# Patient Record
Sex: Female | Born: 1945 | Race: White | Hispanic: No | Marital: Married | Smoking: Former smoker
Health system: Southern US, Community
[De-identification: ages and names within clinical notes are randomized; demographics above are authoritative.]

## PROBLEM LIST (undated history)

## (undated) DIAGNOSIS — J45909 Unspecified asthma, uncomplicated: Secondary | ICD-10-CM

## (undated) DIAGNOSIS — E669 Obesity, unspecified: Secondary | ICD-10-CM

## (undated) DIAGNOSIS — I700 Atherosclerosis of aorta: Secondary | ICD-10-CM

## (undated) DIAGNOSIS — M1990 Unspecified osteoarthritis, unspecified site: Secondary | ICD-10-CM

## (undated) DIAGNOSIS — F411 Generalized anxiety disorder: Secondary | ICD-10-CM

## (undated) DIAGNOSIS — R42 Dizziness and giddiness: Secondary | ICD-10-CM

## (undated) DIAGNOSIS — G2581 Restless legs syndrome: Secondary | ICD-10-CM

## (undated) DIAGNOSIS — I447 Left bundle-branch block, unspecified: Secondary | ICD-10-CM

## (undated) DIAGNOSIS — T7840XA Allergy, unspecified, initial encounter: Secondary | ICD-10-CM

## (undated) DIAGNOSIS — M4317 Spondylolisthesis, lumbosacral region: Secondary | ICD-10-CM

## (undated) DIAGNOSIS — I10 Essential (primary) hypertension: Secondary | ICD-10-CM

## (undated) DIAGNOSIS — K219 Gastro-esophageal reflux disease without esophagitis: Secondary | ICD-10-CM

## (undated) DIAGNOSIS — G709 Myoneural disorder, unspecified: Secondary | ICD-10-CM

## (undated) DIAGNOSIS — I5040 Unspecified combined systolic (congestive) and diastolic (congestive) heart failure: Secondary | ICD-10-CM

## (undated) DIAGNOSIS — H8109 Meniere's disease, unspecified ear: Secondary | ICD-10-CM

## (undated) DIAGNOSIS — H9191 Unspecified hearing loss, right ear: Secondary | ICD-10-CM

## (undated) DIAGNOSIS — R002 Palpitations: Secondary | ICD-10-CM

## (undated) DIAGNOSIS — Z972 Presence of dental prosthetic device (complete) (partial): Secondary | ICD-10-CM

## (undated) DIAGNOSIS — T8859XA Other complications of anesthesia, initial encounter: Secondary | ICD-10-CM

## (undated) HISTORY — PX: HALLUX VALGUS CORRECTION: SUR315

## (undated) HISTORY — PX: BUNIONECTOMY: SHX129

## (undated) HISTORY — PX: COLONOSCOPY: SHX174

## (undated) HISTORY — PX: ECTOPIC PREGNANCY SURGERY: SHX613

## (undated) HISTORY — DX: Atherosclerosis of aorta: I70.0

## (undated) HISTORY — PX: BREAST SURGERY: SHX581

## (undated) HISTORY — PX: FRACTURE SURGERY: SHX138

## (undated) HISTORY — PX: OTHER SURGICAL HISTORY: SHX169

## (undated) HISTORY — PX: SPINE SURGERY: SHX786

## (undated) HISTORY — PX: SHOULDER SURGERY: SHX246

## (undated) HISTORY — PX: APPENDECTOMY: SHX54

## (undated) HISTORY — PX: CARPAL TUNNEL RELEASE: SHX101

## (undated) HISTORY — DX: Allergy, unspecified, initial encounter: T78.40XA

## (undated) HISTORY — PX: TRIGGER FINGER RELEASE: SHX641

## (undated) HISTORY — PX: DILATION AND CURETTAGE OF UTERUS: SHX78

## (undated) HISTORY — PX: MEDIAL PARTIAL KNEE REPLACEMENT: SHX5965

## (undated) HISTORY — DX: Spondylolisthesis, lumbosacral region: M43.17

## (undated) HISTORY — PX: CARDIAC CATHETERIZATION: SHX172

## (undated) HISTORY — PX: EYE SURGERY: SHX253

## (undated) HISTORY — PX: JOINT REPLACEMENT: SHX530

---

## 2012-03-02 ENCOUNTER — Ambulatory Visit: Payer: Self-pay | Admitting: Otolaryngology

## 2014-02-12 ENCOUNTER — Ambulatory Visit: Payer: Self-pay | Admitting: Unknown Physician Specialty

## 2014-04-20 ENCOUNTER — Ambulatory Visit: Payer: Self-pay | Admitting: Unknown Physician Specialty

## 2014-12-07 ENCOUNTER — Ambulatory Visit: Payer: Self-pay | Admitting: Unknown Physician Specialty

## 2015-01-22 ENCOUNTER — Ambulatory Visit
Admit: 2015-01-22 | Disposition: A | Discharge status: Home / Self Care | Payer: Self-pay | Attending: Unknown Physician Specialty | Admitting: Unknown Physician Specialty

## 2015-04-22 ENCOUNTER — Encounter: Payer: Self-pay | Admitting: Emergency Medicine

## 2015-04-22 ENCOUNTER — Ambulatory Visit
Admission: EM | Admit: 2015-04-22 | Discharge: 2015-04-22 | Disposition: A | Discharge status: Home / Self Care | Payer: Medicare Other | Attending: Internal Medicine | Admitting: Internal Medicine

## 2015-04-22 DIAGNOSIS — T63481A Toxic effect of venom of other arthropod, accidental (unintentional), initial encounter: Secondary | ICD-10-CM | POA: Diagnosis not present

## 2015-04-22 HISTORY — DX: Unspecified asthma, uncomplicated: J45.909

## 2015-04-22 HISTORY — DX: Essential (primary) hypertension: I10

## 2015-04-22 MED ORDER — DIPHENHYDRAMINE HCL 25 MG PO TABS: 25.0000 mg | ORAL_TABLET | Freq: Three times a day (TID) | ORAL | Status: DC | PRN

## 2015-04-22 MED ORDER — CETIRIZINE HCL 10 MG PO TABS: 10.0000 mg | ORAL_TABLET | Freq: Every day | ORAL | Status: DC

## 2015-04-22 MED ORDER — METHYLPREDNISOLONE SODIUM SUCC 125 MG IJ SOLR
125.0000 mg | Freq: Once | INTRAMUSCULAR | Status: AC
  Administered 2015-04-22: 125 mg via INTRAMUSCULAR

## 2015-04-22 NOTE — ED Provider Notes (Signed)
CSN: [id number]     Arrival date & time 04/22/15  [id number] History   First MD Initiated Contact with Patient 04/22/15 1108     Chief Complaint  Patient presents with  . Insect Bite   (Consider location/radiation/quality/duration/timing/severity/associated sxs/prior Treatment) HPI Comments: Married caucasian female here for evaluation of bee sting right ear occurred last night woke this am with swollen right eyelids and discomfort right side of face, upper gum discomfort wears dentures.  Works from home.  Has never been stung by bee before this was first time tried epsom salt compresses, baking soda last night.  Benadryl x 2 doses and regular medications taken as prescribed.  The history is provided by the patient.    Past Medical History  Diagnosis Date  . Hypertension   . Asthma    Past Surgical History  Procedure Laterality Date  . Medial partial knee replacement Bilateral   . Shoulder surgery Right    History reviewed. No pertinent family history. History  Substance Use Topics  . Smoking status: Former Games developermoker  . Smokeless tobacco: Never Used  . Alcohol Use: Yes   OB History    No data available     Review of Systems  Constitutional: Negative for fever, chills, diaphoresis, activity change, appetite change and fatigue.  HENT: Positive for ear pain, facial swelling and hearing loss. Negative for congestion, dental problem, drooling, ear discharge, mouth sores, nosebleeds, postnasal drip, rhinorrhea, sinus pressure, sneezing, sore throat, tinnitus, trouble swallowing and voice change.   Eyes: Negative for photophobia, pain, discharge, redness, itching and visual disturbance.  Respiratory: Negative for apnea, cough, choking, chest tightness, shortness of breath, wheezing and stridor.   Cardiovascular: Negative for chest pain, palpitations and leg swelling.  Gastrointestinal: Negative for nausea, vomiting, abdominal pain, diarrhea, constipation, blood in stool and abdominal  distention.  Endocrine: Negative for cold intolerance and heat intolerance.  Genitourinary: Negative for dysuria, hematuria, enuresis and difficulty urinating.  Musculoskeletal: Negative for myalgias, back pain, joint swelling, arthralgias, gait problem, neck pain and neck stiffness.  Skin: Positive for rash. Negative for color change, pallor and wound.  Allergic/Immunologic: Positive for environmental allergies. Negative for food allergies.  Neurological: Negative for dizziness, tremors, seizures, syncope, facial asymmetry, speech difficulty, weakness, light-headedness, numbness and headaches.  Hematological: Negative for adenopathy. Does not bruise/bleed easily.  Psychiatric/Behavioral: Negative for behavioral problems, confusion, sleep disturbance and agitation.    Allergies  Sulfa antibiotics and Zithromax  Home Medications   Prior to Admission medications   Medication Sig Start Date End Date Taking? Authorizing Provider  amLODipine (NORVASC) 5 MG tablet Take 5 mg by mouth daily.   Yes Historical Provider, MD  clonazePAM (KLONOPIN) 0.5 MG tablet Take 0.5 mg by mouth at bedtime.   Yes Historical Provider, MD  fluticasone (FLOVENT HFA) 44 MCG/ACT inhaler Inhale 1 puff into the lungs as needed.   Yes Historical Provider, MD  fosinopril (MONOPRIL) 40 MG tablet Take 40 mg by mouth daily.   Yes Historical Provider, MD  gabapentin (NEURONTIN) 300 MG capsule Take 300 mg by mouth at bedtime.   Yes Historical Provider, MD  hydrochlorothiazide (HYDRODIURIL) 25 MG tablet Take 25 mg by mouth daily.   Yes Historical Provider, MD  meloxicam (MOBIC) 7.5 MG tablet Take 7.5 mg by mouth 2 (two) times daily.   Yes Historical Provider, MD  metoprolol (LOPRESSOR) 50 MG tablet Take 50 mg by mouth 2 (two) times daily.   Yes Historical Provider, MD  ranitidine (ZANTAC) 150 MG tablet Take 150  mg by mouth at bedtime.   Yes Historical Provider, MD  zolpidem (AMBIEN) 5 MG tablet Take 5 mg by mouth at bedtime.    Yes Historical Provider, MD  cetirizine (ZYRTEC) 10 MG tablet Take 1 tablet (10 mg total) by mouth daily. 04/22/15   Barbaraann Barthel, NP  diphenhydrAMINE (BENADRYL) 25 MG tablet Take 1 tablet (25 mg total) by mouth every 8 (eight) hours as needed for itching (breakthrough itching). 04/22/15   Jarold Song Renalda Locklin, NP   BP 151/67 mmHg  Pulse 65  Temp(Src) 98 F (36.7 C) (Tympanic)  Resp 17  Ht 5' (1.524 m)  Wt 211 lb (95.709 kg)  BMI 41.21 kg/m2  SpO2 99% Physical Exam  Constitutional: She is oriented to person, place, and time. Vital signs are normal. She appears well-developed and well-nourished. No distress.  HENT:  Head: Normocephalic and atraumatic. Head is without raccoon's eyes, without Battle's sign, without abrasion, without contusion, without laceration, without right periorbital erythema and without left periorbital erythema. Hair is normal.    Right Ear: External ear and ear canal normal. A middle ear effusion is present.  Left Ear: External ear and ear canal normal. A middle ear effusion is present.  Nose: Nose normal.  Mouth/Throat: Uvula is midline. Mucous membranes are pale, dry and not cyanotic. She does not have dentures. No oral lesions. No trismus in the jaw. Normal dentition. No dental abscesses, uvula swelling, lacerations or dental caries. Posterior oropharyngeal edema and posterior oropharyngeal erythema present. No oropharyngeal exudate or tonsillar abscesses.  Tongue with macular symmetrical lesions x 6 anterior white not tender patient unaware of them until I showed her in mirror; tongue not swollen speech clear; air fluid level bilateral TMs clear; cobblestoning posterior pharynx with drip from above  Eyes: Conjunctivae, EOM and lids are normal. Pupils are equal, round, and reactive to light. Right eye exhibits no chemosis, no discharge, no exudate and no hordeolum. No foreign body present in the right eye. Left eye exhibits no chemosis, no discharge, no exudate and no  hordeolum. No foreign body present in the left eye. Right conjunctiva is not injected. Right conjunctiva has no hemorrhage. Left conjunctiva is not injected. Left conjunctiva has no hemorrhage. No scleral icterus. Right eye exhibits normal extraocular motion and no nystagmus. Left eye exhibits normal extraocular motion and no nystagmus. Right pupil is round and reactive. Left pupil is round and reactive. Pupils are equal.  Nonpitting edema upper and lower eyelid 1-2+/4 right only  Neck: Trachea normal and normal range of motion. Neck supple. No tracheal deviation present. No thyromegaly present.  Cardiovascular: Normal rate, regular rhythm, normal heart sounds and intact distal pulses.  Exam reveals no gallop and no friction rub.   No murmur heard. Pulmonary/Chest: Effort normal and breath sounds normal. No stridor. No respiratory distress. She has no wheezes. She has no rales. She exhibits no tenderness.  Abdominal: Soft. She exhibits no distension.  Musculoskeletal: Normal range of motion. She exhibits no edema or tenderness.  Lymphadenopathy:    She has no cervical adenopathy.  Neurological: She is alert and oriented to person, place, and time. She exhibits normal muscle tone. Coordination normal.  Skin: Skin is warm, dry and intact. Rash noted. She is not diaphoretic. There is erythema. No pallor.  Psychiatric: She has a normal mood and affect. Her speech is normal and behavior is normal. Judgment and thought content normal. Cognition and memory are normal.  Nursing note and vitals reviewed.   ED Course  Procedures (including critical care time) Labs Review Labs Reviewed - No data to display  Imaging Review No results found.  1200 patient reported feeling slightly better.  Ready to go home.  VSS.  Encouraged Benadryl  po QID today then zyrtec  po daily tomorrow.  Zantac  po BID.  Discussed if fever, chills, eye pain, worsening swelling after 48 hours s/p solumedrol to contact  me/clinic.  If when clinic closed and having visual changes/loss, eye orbit pain, fever, chills to go to ER for re-evaluation.  Patient verbalized understanding of information/instructions, agreed with plan of care and had no further questions at this time.  Medications  methylPREDNISolone sodium succinate (SOLU-MEDROL) 125 mg/2 mL injection 125 mg (125 mg Intramuscular Given 04/22/15 1134)   Filed Vitals:   04/22/15 1136  BP: 135/64  Pulse: 61  Temp:   Resp: 16    MDM   1. Allergic reaction to insect sting, accidental or unintentional, initial encounter    Plan: 1. Test/x-ray results and diagnosis reviewed with patient 2. rx as per orders; risks, benefits, potential side effects reviewed with patient 3. Recommend supportive treatment with ice, zyrtec or benadryl, zantac, calamine 4. F/u prn if symptoms worsen or don't improve  Benadryl 25-50mg  po pm prn breaththrough itching otherwise zyrtec  po qam.  Zantac  po BID at home.  May apply calamine lotion or topical benadryl or ice also to right ear/face for itching.  Im solumedrol preferred by patient..  Symptomatic therapy suggested.  Warm to cool water soaks or tepid showers.  If another bee sting this week seek follow up with [facility] or [facility] urgent care as exposure this close together could trigger stronger response.  Call or return to clinic as needed if these symptoms worsen or fail to improve as anticipated.  Go to ER if difficulty breathing, swallowing, dizziness or chest pain for immediate evaluation and treatment follow up with [facility] if ER visit required.   Discussed signs/symptoms infection e.g. Fever, chills, worsening redness, swelling.  Exitcare handout on allergic uticaria, insect bite/sting, cellulitis given to patient.    Patient verbalized agreement and understanding of treatment plan and had no further questions at this time.   P2:  Avoidance and hand washing.   Barbaraann Barthel, NP 04/22/15 1446

## 2015-04-22 NOTE — Discharge Instructions (Signed)
Cellulitis °Cellulitis is an infection of the skin and the tissue beneath it. The infected area is usually red and tender. Cellulitis occurs most often in the arms and lower legs.  °CAUSES  °Cellulitis is caused by bacteria that enter the skin through cracks or cuts in the skin. The most common types of bacteria that cause cellulitis are staphylococci and streptococci. °SIGNS AND SYMPTOMS  °· Redness and warmth. °· Swelling. °· Tenderness or pain. °· Fever. °DIAGNOSIS  °Your health care provider can usually determine what is wrong based on a physical exam. Blood tests may also be done. °TREATMENT  °Treatment usually involves taking an antibiotic medicine. °HOME CARE INSTRUCTIONS  °· Take your antibiotic medicine as directed by your health care provider. Finish the antibiotic even if you start to feel better. °· Keep the infected arm or leg elevated to reduce swelling. °· Apply a warm cloth to the affected area up to 4 times per day to relieve pain. °· Take medicines only as directed by your health care provider. °· Keep all follow-up visits as directed by your health care provider. °SEEK MEDICAL CARE IF:  °· You notice red streaks coming from the infected area. °· Your red area gets larger or turns dark in color. °· Your bone or joint underneath the infected area becomes painful after the skin has healed. °· Your infection returns in the same area or another area. °· You notice a swollen bump in the infected area. °· You develop new symptoms. °· You have a fever. °SEEK IMMEDIATE MEDICAL CARE IF:  °· You feel very sleepy. °· You develop vomiting or diarrhea. °· You have a general ill feeling (malaise) with muscle aches and pains. °MAKE SURE YOU:  °· Understand these instructions. °· Will watch your condition. °· Will get help right away if you are not doing well or get worse. °Document Released: 07/01/2005 Document Revised: 02/05/2014 Document Reviewed: 12/07/2011 °ExitCare® Patient Information ©2015 [employer].  This information is not intended to replace advice given to you by your health care provider. Make sure you discuss any questions you have with your health care provider. °Bee, Wasp, or Hornet Sting °Your caregiver has diagnosed you as having an insect sting. An insect sting appears as a red lump in the skin that sometimes has a tiny hole in the center, or it may have a stinger in the center of the wound. The most common stings are from wasps, hornets and bees. °Individuals have different reactions to insect stings. °· A normal reaction may cause pain, swelling, and redness around the sting site. °· A localized allergic reaction may cause swelling and redness that extends beyond the sting site. °· A large local reaction may continue to develop over the next 12 to 36 hours. °· On occasion, the reactions can be severe (anaphylactic reaction). An anaphylactic reaction may cause wheezing; difficulty breathing; chest pain; fainting; raised, itchy, red patches on the skin; a sick feeling to your stomach (nausea); vomiting; cramping; or diarrhea. If you have had an anaphylactic reaction to an insect sting in the past, you are more likely to have one again. °HOME CARE INSTRUCTIONS  °· With bee stings, a small sac of poison is left in the wound. Brushing across this with something such as a credit card, or anything similar, will help remove this and decrease the amount of the reaction. This same procedure will not help a wasp sting as they do not leave behind a stinger and poison sac. °· Apply   a cold compress for 10 to 20 minutes every hour for 1 to 2 days, depending on severity, to reduce swelling and itching. °· To lessen pain, a paste made of water and baking soda may be rubbed on the bite or sting and left on for 5 minutes. °· To relieve itching and swelling, you may use take medication or apply medicated creams or lotions as directed. °· Only take over-the-counter or prescription medicines for pain, discomfort, or fever  as directed by your caregiver. °· Wash the sting site daily with soap and water. Apply antibiotic ointment on the sting site as directed. °· If you suffered a severe reaction: °· If you did not require hospitalization, an adult will need to stay with you for 24 hours in case the symptoms return. °· You may need to wear a medical bracelet or necklace stating the allergy. °· You and your family need to learn when and how to use an anaphylaxis kit or epinephrine injection. °· If you have had a severe reaction before, always carry your anaphylaxis kit with you. °SEEK MEDICAL CARE IF:  °· None of the above helps within 2 to 3 days. °· The area becomes red, warm, tender, and swollen beyond the area of the bite or sting. °· You have an oral temperature above 102° F (38.9° C). °SEEK IMMEDIATE MEDICAL CARE IF:  °You have symptoms of an allergic reaction which are: °· Wheezing. °· Difficulty breathing. °· Chest pain. °· Lightheadedness or fainting. °· Itchy, raised, red patches on the skin. °· Nausea, vomiting, cramping or diarrhea. °ANY OF THESE SYMPTOMS MAY REPRESENT A SERIOUS PROBLEM THAT IS AN EMERGENCY. Do not wait to see if the symptoms will go away. Get medical help right away. Call your local emergency services (911 in [place] DO NOT drive yourself to the hospital. °MAKE SURE YOU:  °· Understand these instructions. °· Will watch your condition. °· Will get help right away if you are not doing well or get worse. °Document Released: 09/21/2005 Document Revised: 12/14/2011 Document Reviewed: 03/08/2010 °ExitCare® Patient Information ©2015 [employer]. This information is not intended to replace advice given to you by your health care provider. Make sure you discuss any questions you have with your health care provider. ° °

## 2015-04-22 NOTE — ED Notes (Signed)
Patient states that she was stung by a honey bee in her right ear.  Patient reports swelling in her R ear and the swelling in her right eye that started this morning.

## 2015-06-21 ENCOUNTER — Other Ambulatory Visit: Payer: Self-pay | Admitting: Unknown Physician Specialty

## 2015-06-21 DIAGNOSIS — M25552 Pain in left hip: Secondary | ICD-10-CM

## 2015-06-29 ENCOUNTER — Ambulatory Visit
Admission: RE | Admit: 2015-06-29 | Discharge: 2015-06-29 | Disposition: A | Discharge status: Home / Self Care | Payer: Medicare Other | Source: Ambulatory Visit | Attending: Unknown Physician Specialty | Admitting: Unknown Physician Specialty

## 2015-06-29 DIAGNOSIS — M1612 Unilateral primary osteoarthritis, left hip: Secondary | ICD-10-CM | POA: Insufficient documentation

## 2015-06-29 DIAGNOSIS — M25552 Pain in left hip: Secondary | ICD-10-CM | POA: Diagnosis present

## 2015-07-19 ENCOUNTER — Ambulatory Visit: Payer: Medicare Other | Admitting: Anesthesiology

## 2015-07-19 ENCOUNTER — Encounter: Payer: Self-pay | Admitting: *Deleted

## 2015-07-19 ENCOUNTER — Ambulatory Visit
Admission: RE | Admit: 2015-07-19 | Discharge: 2015-07-19 | Disposition: A | Discharge status: Home / Self Care | Payer: Medicare Other | Source: Ambulatory Visit | Attending: Unknown Physician Specialty | Admitting: Unknown Physician Specialty

## 2015-07-19 ENCOUNTER — Encounter
Admission: RE | Disposition: A | Discharge status: Home / Self Care | Payer: Self-pay | Source: Ambulatory Visit | Attending: Unknown Physician Specialty

## 2015-07-19 DIAGNOSIS — M1990 Unspecified osteoarthritis, unspecified site: Secondary | ICD-10-CM | POA: Diagnosis not present

## 2015-07-19 DIAGNOSIS — Z820 Family history of epilepsy and other diseases of the nervous system: Secondary | ICD-10-CM | POA: Insufficient documentation

## 2015-07-19 DIAGNOSIS — M25562 Pain in left knee: Secondary | ICD-10-CM | POA: Diagnosis present

## 2015-07-19 DIAGNOSIS — Z882 Allergy status to sulfonamides status: Secondary | ICD-10-CM | POA: Insufficient documentation

## 2015-07-19 DIAGNOSIS — Z96652 Presence of left artificial knee joint: Secondary | ICD-10-CM | POA: Insufficient documentation

## 2015-07-19 DIAGNOSIS — Z6841 Body Mass Index (BMI) 40.0 and over, adult: Secondary | ICD-10-CM | POA: Diagnosis not present

## 2015-07-19 DIAGNOSIS — Z881 Allergy status to other antibiotic agents status: Secondary | ICD-10-CM | POA: Diagnosis not present

## 2015-07-19 DIAGNOSIS — M25462 Effusion, left knee: Secondary | ICD-10-CM | POA: Insufficient documentation

## 2015-07-19 DIAGNOSIS — I10 Essential (primary) hypertension: Secondary | ICD-10-CM | POA: Insufficient documentation

## 2015-07-19 DIAGNOSIS — M2342 Loose body in knee, left knee: Secondary | ICD-10-CM | POA: Diagnosis not present

## 2015-07-19 DIAGNOSIS — Z79899 Other long term (current) drug therapy: Secondary | ICD-10-CM | POA: Insufficient documentation

## 2015-07-19 DIAGNOSIS — J45909 Unspecified asthma, uncomplicated: Secondary | ICD-10-CM | POA: Diagnosis not present

## 2015-07-19 DIAGNOSIS — Z8249 Family history of ischemic heart disease and other diseases of the circulatory system: Secondary | ICD-10-CM | POA: Diagnosis not present

## 2015-07-19 DIAGNOSIS — K219 Gastro-esophageal reflux disease without esophagitis: Secondary | ICD-10-CM | POA: Insufficient documentation

## 2015-07-19 DIAGNOSIS — Z87891 Personal history of nicotine dependence: Secondary | ICD-10-CM | POA: Diagnosis not present

## 2015-07-19 DIAGNOSIS — Z825 Family history of asthma and other chronic lower respiratory diseases: Secondary | ICD-10-CM | POA: Insufficient documentation

## 2015-07-19 DIAGNOSIS — M65862 Other synovitis and tenosynovitis, left lower leg: Secondary | ICD-10-CM | POA: Diagnosis not present

## 2015-07-19 DIAGNOSIS — E669 Obesity, unspecified: Secondary | ICD-10-CM | POA: Diagnosis not present

## 2015-07-19 HISTORY — DX: Myoneural disorder, unspecified: G70.9

## 2015-07-19 HISTORY — DX: Dizziness and giddiness: R42

## 2015-07-19 HISTORY — PX: KNEE ARTHROSCOPY: SHX127

## 2015-07-19 HISTORY — DX: Gastro-esophageal reflux disease without esophagitis: K21.9

## 2015-07-19 HISTORY — DX: Presence of dental prosthetic device (complete) (partial): Z97.2

## 2015-07-19 HISTORY — DX: Meniere's disease, unspecified ear: H81.09

## 2015-07-19 HISTORY — DX: Unspecified osteoarthritis, unspecified site: M19.90

## 2015-07-19 SURGERY — ARTHROSCOPY, KNEE
Anesthesia: General | Laterality: Left | Wound class: Clean

## 2015-07-19 MED ORDER — PROPOFOL 10 MG/ML IV BOLUS
INTRAVENOUS | Status: DC | PRN
  Administered 2015-07-19: 150 mg via INTRAVENOUS

## 2015-07-19 MED ORDER — LACTATED RINGERS IV SOLN
INTRAVENOUS | Status: DC
  Administered 2015-07-19: 07:00:00 via INTRAVENOUS

## 2015-07-19 MED ORDER — BUPIVACAINE HCL (PF) 0.5 % IJ SOLN
INTRAMUSCULAR | Status: DC | PRN
  Administered 2015-07-19: 20 mL

## 2015-07-19 MED ORDER — FENTANYL CITRATE (PF) 100 MCG/2ML IJ SOLN
INTRAMUSCULAR | Status: DC | PRN
  Administered 2015-07-19 (×4): 50 ug via INTRAVENOUS

## 2015-07-19 MED ORDER — OXYCODONE HCL 5 MG/5ML PO SOLN: 5.0000 mg | Freq: Once | ORAL | Status: DC | PRN

## 2015-07-19 MED ORDER — PROMETHAZINE HCL 25 MG/ML IJ SOLN: 6.2500 mg | INTRAMUSCULAR | Status: DC | PRN

## 2015-07-19 MED ORDER — LIDOCAINE HCL (CARDIAC) 20 MG/ML IV SOLN
INTRAVENOUS | Status: DC | PRN
  Administered 2015-07-19: 40 mg via INTRATRACHEAL

## 2015-07-19 MED ORDER — ONDANSETRON HCL 4 MG/2ML IJ SOLN
INTRAMUSCULAR | Status: DC | PRN
  Administered 2015-07-19: 4 mg via INTRAVENOUS

## 2015-07-19 MED ORDER — OXYCODONE HCL 5 MG PO TABS: 5.0000 mg | ORAL_TABLET | Freq: Once | ORAL | Status: DC | PRN

## 2015-07-19 MED ORDER — HYDROCODONE-ACETAMINOPHEN 5-325 MG PO TABS: 1.0000 | ORAL_TABLET | Freq: Four times a day (QID) | ORAL | Status: DC | PRN

## 2015-07-19 MED ORDER — CEFAZOLIN SODIUM 1-5 GM-% IV SOLN
INTRAVENOUS | Status: DC | PRN
  Administered 2015-07-19: 1 g via INTRAVENOUS

## 2015-07-19 MED ORDER — DEXAMETHASONE SODIUM PHOSPHATE 4 MG/ML IJ SOLN
INTRAMUSCULAR | Status: DC | PRN
  Administered 2015-07-19: 8 mg via INTRAVENOUS

## 2015-07-19 MED ORDER — FENTANYL CITRATE (PF) 100 MCG/2ML IJ SOLN
25.0000 ug | INTRAMUSCULAR | Status: DC | PRN
  Administered 2015-07-19: 25 ug via INTRAVENOUS
  Administered 2015-07-19: 50 ug via INTRAVENOUS
  Administered 2015-07-19: 25 ug via INTRAVENOUS

## 2015-07-19 MED ORDER — MIDAZOLAM HCL 5 MG/5ML IJ SOLN
INTRAMUSCULAR | Status: DC | PRN
  Administered 2015-07-19: 2 mg via INTRAVENOUS

## 2015-07-19 MED ORDER — CEFAZOLIN SODIUM-DEXTROSE 2-3 GM-% IV SOLR: INTRAVENOUS | Status: DC | PRN

## 2015-07-19 SURGICAL SUPPLY — 40 items
ARTHROWAND PARAGON T2 (SURGICAL WAND) ×3
BLADE ABRADER 4.5 (BLADE) ×3 IMPLANT
BLADE FULL RADIUS 3.5 (BLADE) ×3 IMPLANT
BUR RADIUS 3.5 (BURR) IMPLANT
BUR RADIUS 4.0X18.5 (BURR) IMPLANT
BUR ROUND 5.5 (BURR) IMPLANT
BURR ROUND 12 FLUTE 4.0MM (BURR) IMPLANT
COVER LIGHT HANDLE FLEXIBLE (MISCELLANEOUS) ×3 IMPLANT
CUFF TOURN SGL QUICK 24 (TOURNIQUET CUFF)
CUFF TOURN SGL QUICK 30 (MISCELLANEOUS)
CUFF TOURN SGL QUICK 34 (TOURNIQUET CUFF)
CUFF TRNQT CYL 24X4X40X1 (TOURNIQUET CUFF) IMPLANT
CUFF TRNQT CYL 34X4X40X1 (TOURNIQUET CUFF) IMPLANT
CUFF TRNQT CYL LO 30X4X (MISCELLANEOUS) IMPLANT
CUTTER SLOTTED WHISKER 4.0 (BURR) IMPLANT
DRAPE LEGGINS SURG 28X43 STRL (DRAPES) ×3 IMPLANT
DURAPREP 26ML APPLICATOR (WOUND CARE) ×3 IMPLANT
GAUZE SPONGE 4X4 12PLY STRL (GAUZE/BANDAGES/DRESSINGS) ×3 IMPLANT
GLOVE BIO SURGEON STRL SZ7.5 (GLOVE) ×3 IMPLANT
GLOVE BIO SURGEON STRL SZ8 (GLOVE) ×3 IMPLANT
GLOVE INDICATOR 8.0 STRL GRN (GLOVE) ×3 IMPLANT
GOWN STRL REIN 2XL XLG LVL4 (GOWN DISPOSABLE) ×3 IMPLANT
GOWN STRL REUS W/TWL 2XL LVL3 (GOWN DISPOSABLE) ×3 IMPLANT
IV LACTATED RINGER IRRG 3000ML (IV SOLUTION) ×4
IV LR IRRIG 3000ML ARTHROMATIC (IV SOLUTION) ×2 IMPLANT
MANIFOLD 4PT FOR NEPTUNE1 (MISCELLANEOUS) ×3 IMPLANT
PACK ARTHROSCOPY KNEE (MISCELLANEOUS) ×3 IMPLANT
SET TUBE SUCT SHAVER OUTFL 24K (TUBING) ×3 IMPLANT
SOL PREP PVP 2OZ (MISCELLANEOUS) ×3
SOLUTION PREP PVP 2OZ (MISCELLANEOUS) ×1 IMPLANT
SUT ETHILON 3-0 FS-10 30 BLK (SUTURE) ×3
SUTURE EHLN 3-0 FS-10 30 BLK (SUTURE) ×1 IMPLANT
TAPE MICROFOAM 4IN (TAPE) ×3 IMPLANT
TUBING ARTHRO INFLOW-ONLY STRL (TUBING) ×3 IMPLANT
WAND ARTHRO PARAGON T2 (SURGICAL WAND) ×1 IMPLANT
WAND COVAC 50 IFS (MISCELLANEOUS) IMPLANT
WAND HAND CNTRL MULTIVAC 50 (MISCELLANEOUS) IMPLANT
WAND HAND CNTRL MULTIVAC 90 (MISCELLANEOUS) IMPLANT
WAND MEGAVAC 90 (MISCELLANEOUS) IMPLANT
WAND ULTRAVAC 90 (MISCELLANEOUS) IMPLANT

## 2015-07-19 NOTE — Transfer of Care (Signed)
Immediate Anesthesia Transfer of Care Note  Patient: Katrina Poole  Procedure(s) Performed: Procedure(s) with comments: ARTHROSCOPY KNEE (Left) - 1ST CASE PER CECE  Patient Location: PACU  Anesthesia Type: General  Level of Consciousness: awake, alert  and patient cooperative  Airway and Oxygen Therapy: Patient Spontanous Breathing and Patient connected to supplemental oxygen  Post-op Assessment: Post-op Vital signs reviewed, Patient's Cardiovascular Status Stable, Respiratory Function Stable, Patent Airway and No signs of Nausea or vomiting  Post-op Vital Signs: Reviewed and stable  Complications: No apparent anesthesia complications

## 2015-07-19 NOTE — Anesthesia Postprocedure Evaluation (Signed)
  Anesthesia Post-op Note  Patient: Katrina Poole  Procedure(s) Performed: Procedure(s) with comments: ARTHROSCOPY KNEE (Left) - 1ST CASE PER CECE  Anesthesia type:General  Patient location: PACU  Post pain: Pain level controlled  Post assessment: Post-op Vital signs reviewed, Patient's Cardiovascular Status Stable, Respiratory Function Stable, Patent Airway and No signs of Nausea or vomiting  Post vital signs: Reviewed and stable  Last Vitals:  Filed Vitals:   07/19/15 0945  BP: 148/89  Pulse: 78  Temp:   Resp: 18    Level of consciousness: awake, alert  and patient cooperative  Complications: No apparent anesthesia complications

## 2015-07-19 NOTE — Anesthesia Preprocedure Evaluation (Addendum)
Anesthesia Evaluation    Airway Mallampati: II  TM Distance: >3 FB Neck ROM: Full    Dental no notable dental hx. (+) Upper Dentures   Pulmonary asthma , former smoker,    Pulmonary exam normal breath sounds clear to auscultation       Cardiovascular hypertension, negative cardio ROS Normal cardiovascular exam Rhythm:Regular Rate:Normal     Neuro/Psych  Neuromuscular disease    GI/Hepatic GERD  Controlled,  Endo/Other    Renal/GU      Musculoskeletal  (+) Arthritis ,   Abdominal (+) + obese,   Peds  Hematology   Anesthesia Other Findings   Reproductive/Obstetrics                            Anesthesia Physical Anesthesia Plan  ASA: III  Anesthesia Plan: General   Post-op Pain Management:    Induction: Intravenous  Airway Management Planned:   Additional Equipment:   Intra-op Plan:   Post-operative Plan: Extubation in OR  Informed Consent: I have reviewed the patients History and Physical, chart, labs and discussed the procedure including the risks, benefits and alternatives for the proposed anesthesia with the patient or authorized representative who has indicated his/her understanding and acceptance.   Dental advisory given  Plan Discussed with: CRNA  Anesthesia Plan Comments:         Anesthesia Quick Evaluation

## 2015-07-19 NOTE — Anesthesia Procedure Notes (Signed)
Procedure Name: LMA Insertion Date/Time: 07/19/2015 7:45 AM Performed by: Andee PolesBUSH, Chance Karam Pre-anesthesia Checklist: Patient identified, Emergency Drugs available, Suction available, Timeout performed and Patient being monitored Patient Re-evaluated:Patient Re-evaluated prior to inductionOxygen Delivery Method: Circle system utilized Preoxygenation: Pre-oxygenation with 100% oxygen Intubation Type: IV induction LMA: LMA inserted LMA Size: 4.0 Number of attempts: 1 Placement Confirmation: positive ETCO2 and breath sounds checked- equal and bilateral Tube secured with: Tape

## 2015-07-19 NOTE — Discharge Instructions (Signed)
General Anesthesia, Adult, Care After °Refer to this sheet in the next few weeks. These instructions provide you with information on caring for yourself after your procedure. Your health care provider may also give you more specific instructions. Your treatment has been planned according to current medical practices, but problems sometimes occur. Call your health care provider if you have any problems or questions after your procedure. °WHAT TO EXPECT AFTER THE PROCEDURE °After the procedure, it is typical to experience: °· Sleepiness. °· Nausea and vomiting. °HOME CARE INSTRUCTIONS °· For the first 24 hours after general anesthesia: °¨ Have a responsible person with you. °¨ Do not drive a car. If you are alone, do not take public transportation. °¨ Do not drink alcohol. °¨ Do not take medicine that has not been prescribed by your health care provider. °¨ Do not sign important papers or make important decisions. °¨ You may resume a normal diet and activities as directed by your health care provider. °· Change bandages (dressings) as directed. °· If you have questions or problems that seem related to general anesthesia, call the hospital and ask for the anesthetist or anesthesiologist on call. °SEEK MEDICAL CARE IF: °· You have nausea and vomiting that continue the day after anesthesia. °· You develop a rash. °SEEK IMMEDIATE MEDICAL CARE IF:  °· You have difficulty breathing. °· You have chest pain. °· You have any allergic problems. °  °This information is not intended to replace advice given to you by your health care provider. Make sure you discuss any questions you have with your health care provider. °  °Document Released: 12/28/2000 Document Revised: 10/12/2014 Document Reviewed: 01/20/2012 °Elsevier Interactive Patient Education ©2016 [employer]. ° ° °[facility]  °Demetrice Amstutz B. Crislyn Willbanks, Jr., M.D. [phone]  ° °KNEE ARTHROSCOPY POST OPERATION INSTRUCTIONS: ° °PLEASE  READ THESE INSTRUCTIONS ABOUT POST OPERATION CARE. THEY WILL ANSWER MOST OF YOUR QUESTIONS.  °You have been given a prescription for pain. Please take as directed for pain.  °You can walk, keeping the knee slightly stiff-avoid doing too much bending the first day. (if ACL reconstruction is performed, keep brace locked in extension when walking.)  °You will use crutches or cane if needed. Can weight bear as tolerated  °Plan to take three to four days off from work. You can resume work when you are comfortable. (This can be a week or more, depending on the type of work you do.)  °To reduce pain and swelling, place one to two pillows under the knee the first two or three days when sitting or lying. An ice pack may be placed on top of the area over the dressing. Instructions for making homemade icepack are as follow:  °Flexible homemade alcohol water ice pack  °2 cups water  °1 cup rubbing alcohol  °food coloring for the blue tint (optional)  °2 zip-top bags - gallon-size  °Mix the water and alcohol together in one of your zip-top bags and add food coloring. Release as much air as possible and seal the bag. Place in freezer for at least 12 hours.  °The small incisions in your knee are closed with nylon stitches. They will be removed in the office.  °The bulky dressing may be removed in the third day after surgery. (If ACL surgery-DO NOT REMOVE BANDAGES). Put a waterproof band-aid over each stitch. Do not put any creams or ointments on wounds. You may shower at this time, but change waterproof band-aids after showering. KEEP INCISIONS CLEAN   AND DRY UNTIL YOU RETURN TO THE OFFICE.  °Sometimes the operative area remains somewhat painful and swollen for several weeks. This is usually nothing to worry about, but call if you have any excessive symptoms, especially fever. It is not unusual to have a low grade fever of 99 degrees for the first few days. If persist after 3-4 days call the office. It is not uncommon for the pain  to be a little worse on the third day after surgery.  °Begin doing gentle exercises right away. They will be limited by the amount of pain and swelling you have.  Exercising will reduce the swelling, increase motion, and prevent muscle weakness. Exercises: Straight leg raising and gentle knee bending.  °Take 81 milligram aspirin twice a day for 2 weeks after meals or milk. This along with elevation will help reduce the possibility of phlebitis in your operated leg.  °Avoid strenuous athletics for a minimum of 4 to 6 weeks after arthroscopic surgery (approximately five months if ACL surgery).  °If the surgery included ACL reconstruction the brace that is supplied to the extremity post surgery is to be locked in extension when you are asleep and is to be locked in extension when you are ambulating. It can be unlocked for exercises or sitting.  °Keep your post surgery appointment that has been made for you. If you do not remember the date call [phone]. Your follow up appointment should be between 7-10 days.  ° °

## 2015-07-19 NOTE — Op Note (Signed)
Patient:  Katrina Poole, Liesel G.  Preoperative diagnosis: Reactive synovitis plus possible loose bodies  Postop diagnosis: Same plus early lateral compartment and retropatellar chondral changes  Operation: Arthroscopic limited synovectomy along with lateral compartment and retropatellar chondral debridement plus removal of several small loose bodies  Surgeon: Randon GoldsmithKERNODLE,Kelee Cunningham B, MD  Anesthesia: Gen.   History: Patient's had a long history of left knee pain.  The patient ultimately had a medial unicompartmental knee replacement. Despite the surgery she still had some residual left knee pain along with an intermittent effusion. Follow-up x-rays of her left knee had revealed a little narrowing of her lateral compartment. Her medial  unicompartmental knee replacement remained well positioned with no evidence for loosening. She was brought in for arthroscopic evaluation of her left knee after her knee aspirate was no growth.  The patient was ultimately scheduled for left knee arthroscopy.  The patient was taken the operating room where satisfactory general anesthesia was achieved. A tourniquet and leg holder were was applied to the left thigh. A well leg support was applied to the nonoperative extremity. The left knee was prepped and draped in usual fashion for an arthroscopic procedure. The patient was given 1 g of Kefzol IV prior to the actual start of the procedure. An inflow cannula was introduced superomedially. The joint was distended with lactated Ringer's. Scope was introduced through an inferolateral puncture wound and a probe through an inferomedial puncture wound. Inspection of the medial compartment revealed  that her unicompartmental knee replacement components were stable. There was some synovial ingrowth between the femoral component and the polyethylene spacer. This tissue was debrided with a small synovial resector. I also used a synovial resector to remove some chondral overgrowth on the  lateral border of the medial femoral component. Inspection of the intercondylar notch revealed an old anterior cruciate tear. Inspection of the the lateral compartment revealed some grade 2 chondral changes in the mid weightbearing portion of the lateral femoral condyle and fibrillation of the lateral tibial plateau. The patient also had a degenerative tear of the posterior horn root of the lateral meniscus. This tear was resected with a motorized resector. I debrided the frayed lateral compartment articular surfaces with a turbo whisker and then coblated the lateral femoral chondral lesion with a [employer] wand. I also removed several small loose bodies from the lateral compartment. Trochlear groove was inspected and appeared to be fairly smooth.  Retropatellar surface was slightly frayed with several areas of grade 2 chondral changes. I debrided these lesions with a turbo whisker. I observed patella tracking from the superomedial portal. The patella seemed to track fairly well.  The instruments were removed from the joint at this time. The puncture wounds were closed with 3-0 nylon in vertical mattress fashion. I injected each puncture wound with several cc of half percent Marcaine without epinephrine. Betadine was applied the wounds followed by sterile dressing. An ice pack was applied to the right knee. The patient was awakened and transferred to the stretcher bed. The patient was taken to the recovery room in satisfactory condition.  The tourniquet was  inflated shortly after the start of the procedure and then deflated at the conclusion of the procedure. It was up about 38 minutes. Blood loss was negligible.

## 2015-07-19 NOTE — H&P (Signed)
  H and P reviewed. No changes. Uploaded at later date. 

## 2015-07-22 ENCOUNTER — Encounter: Payer: Self-pay | Admitting: Unknown Physician Specialty

## 2015-08-15 DIAGNOSIS — Z9889 Other specified postprocedural states: Secondary | ICD-10-CM

## 2015-08-15 HISTORY — DX: Other specified postprocedural states: Z98.890

## 2015-08-19 ENCOUNTER — Other Ambulatory Visit: Payer: Self-pay | Admitting: Unknown Physician Specialty

## 2015-08-19 DIAGNOSIS — Z96652 Presence of left artificial knee joint: Secondary | ICD-10-CM

## 2015-08-26 ENCOUNTER — Encounter: Admission: RE | Admit: 2015-08-26 | Payer: Medicare Other | Source: Ambulatory Visit

## 2015-09-04 ENCOUNTER — Ambulatory Visit
Admission: RE | Admit: 2015-09-04 | Discharge: 2015-09-04 | Disposition: A | Discharge status: Home / Self Care | Payer: Medicare Other | Source: Ambulatory Visit | Attending: Unknown Physician Specialty | Admitting: Unknown Physician Specialty

## 2015-09-04 ENCOUNTER — Encounter
Admission: RE | Admit: 2015-09-04 | Discharge: 2015-09-04 | Disposition: A | Discharge status: Home / Self Care | Payer: Medicare Other | Source: Ambulatory Visit | Attending: Unknown Physician Specialty | Admitting: Unknown Physician Specialty

## 2015-09-04 DIAGNOSIS — M25562 Pain in left knee: Secondary | ICD-10-CM | POA: Insufficient documentation

## 2015-09-04 DIAGNOSIS — Z96652 Presence of left artificial knee joint: Secondary | ICD-10-CM | POA: Diagnosis present

## 2015-09-04 MED ORDER — TECHNETIUM TC 99M MEDRONATE IV KIT
25.0000 | PACK | Freq: Once | INTRAVENOUS | Status: AC | PRN
  Administered 2015-09-04: 23.845 via INTRAVENOUS

## 2015-09-23 ENCOUNTER — Ambulatory Visit
Admission: EM | Admit: 2015-09-23 | Discharge: 2015-09-23 | Disposition: A | Discharge status: Home / Self Care | Payer: Medicare Other | Attending: Emergency Medicine | Admitting: Emergency Medicine

## 2015-09-23 DIAGNOSIS — J069 Acute upper respiratory infection, unspecified: Secondary | ICD-10-CM | POA: Diagnosis not present

## 2015-09-23 DIAGNOSIS — J310 Chronic rhinitis: Secondary | ICD-10-CM

## 2015-09-23 DIAGNOSIS — J329 Chronic sinusitis, unspecified: Secondary | ICD-10-CM

## 2015-09-23 MED ORDER — MOMETASONE FUROATE 50 MCG/ACT NA SUSP: 2.0000 | Freq: Every day | NASAL | Status: DC

## 2015-09-23 NOTE — ED Notes (Signed)
Started this past Saturday with nasal congestion, head pressure. 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 +sore throat this morning, nasal congestion

## 2015-09-23 NOTE — Discharge Instructions (Signed)
Continue the saline nasal irrigation. This is the best thing for you. Use the Nasonex once to twice a day. Return if you get worse, have a fever >100.4, or for any concerns. You may take 600 mg of motrin with 1 gram of tylenol up to 3-4 times a day as needed for pain. This is an effective combination for pain.  Most infections are viral and do not need antibiotics unless you have a high fever, have had this for 10 days, or you get better and then get sick again.    Go to www.goodrx.com to look up your medications. This will give you a list of where you can find your prescriptions at the most affordable prices.

## 2015-09-23 NOTE — ED Provider Notes (Signed)
HPI  SUBJECTIVE:  Katrina Poole is a 69 y.o. female who presents with nasal congestion for the past 2 days, postnasal drip, sore throat, cough, maxillary sinus pain/pressure, mild chest tightness, mild frontal headache. Patient has been using Vicks vapor rub her nostrils, neti pot several times a day, taking hot showers. Symptoms are worse with lying down, no alleviating factors. She denies fevers, bodyaches, dental pain, coughing all night long.  No wheezing, chest pain, shortness of breath. No sick contacts. She did get a pneumonia shot. She did not get a flu shot this year. Is concerned because she has to get on a plane tomorrow. Past medical history of hypertension well controlled medications, sinusitis, pneumonia, bronchitis. No history of diabetes, asthma, emphysema, COPD, she is not a smoker.  Past Medical History  Diagnosis Date  . Hypertension     controlled on meds  . Neuromuscular disorder (HCC)     finger tips go numb, neck vertebrae  . Asthma     humidity and exercise during summer  . Arthritis     osteo of left hip  . GERD (gastroesophageal reflux disease)   . Vertigo   . Wears dentures     full dentures on top, partial bottom  . Meniere syndrome     hearing deaf right ear    Past Surgical History  Procedure Laterality Date  . Medial partial knee replacement Bilateral   . Shoulder surgery Right   . Trigger finger release Bilateral   . Hallux valgus correction Bilateral   . Exploratory laparoscopy      infertility surgery  . Carpal tunnel release    . Colonoscopy    . Cardiac catheterization      x2 both normal  . Joint replacement Bilateral     partial knee  . Knee arthroscopy Left 07/19/2015    Procedure: Arthroscopic limited synovectomy along with lateral compartment and retropatellar chondral debridement plus removal of several small loose bodies;  Surgeon: Erin SonsHarold Kernodle, MD;  [facility];  Service: Orthopedics;  Laterality: Left;  1ST  CASE PER CECE    Family History  Problem Relation Age of Onset  . Heart failure Mother   . COPD Mother     Social History  Substance Use Topics  . Smoking status: Former Smoker -- 1.50 packs/day for 30 years    Types: Cigarettes  . Smokeless tobacco: Never Used  . Alcohol Use: Yes     Comment: 2-3 / month    No current facility-administered medications for this encounter.  Current outpatient prescriptions:  .  amLODipine (NORVASC) 5 MG tablet, Take 5 mg by mouth daily. am, Disp: , Rfl:  .  clonazePAM (KLONOPIN) 0.5 MG tablet, Take 0.5 mg by mouth at bedtime., Disp: , Rfl:  .  fosinopril (MONOPRIL) 40 MG tablet, Take 40 mg by mouth daily. am, Disp: , Rfl:  .  gabapentin (NEURONTIN) 300 MG capsule, Take 600 mg by mouth at bedtime. , Disp: , Rfl:  .  hydrochlorothiazide (HYDRODIURIL) 25 MG tablet, Take 25 mg by mouth daily. am, Disp: , Rfl:  .  meloxicam (MOBIC) 7.5 MG tablet, Take 7.5 mg by mouth 2 (two) times daily. Am and pm, Disp: , Rfl:  .  metoprolol (LOPRESSOR) 50 MG tablet, Take 50 mg by mouth daily. am, Disp: , Rfl:  .  Multiple Vitamin (MULTIVITAMIN) tablet, Take 1 tablet by mouth daily. am, Disp: , Rfl:  .  ranitidine (ZANTAC) 150 MG tablet, Take 150 mg  by mouth daily. am, Disp: , Rfl:  .  Sennosides-Docusate Sodium (STOOL SOFTENER LAXATIVE PO), Take by mouth as needed. am, Disp: , Rfl:  .  zolpidem (AMBIEN) 5 MG tablet, Take 5 mg by mouth at bedtime., Disp: , Rfl:  .  [DISCONTINUED] cetirizine (ZYRTEC) 10 MG tablet, Take 1 tablet (10 mg total) by mouth daily. (Patient taking differently: Take 10 mg by mouth as needed for allergies. ), Disp: 30 tablet, Rfl: 0 .  fluticasone (FLOVENT HFA) 44 MCG/ACT inhaler, Inhale 1 puff into the lungs as needed., Disp: , Rfl:  .  mometasone (NASONEX) 50 MCG/ACT nasal spray, Place 2 sprays into the nose daily., Disp: 17 g, Rfl: 0 .  [DISCONTINUED] diphenhydrAMINE (BENADRYL) 25 MG tablet, Take 1 tablet (25 mg total) by mouth every 8 (eight)  hours as needed for itching (breakthrough itching). (Patient not taking: Reported on 07/11/2015), Disp: 20 tablet, Rfl: 0 .  [DISCONTINUED] fluticasone (FLONASE) 50 MCG/ACT nasal spray, Place 2 sprays into both nostrils as needed for allergies (spring). , Disp: , Rfl:   Allergies  Allergen Reactions  . Sulfa Antibiotics Other (See Comments)    Altered Metal Status  . Zithromax [Azithromycin] Hives     ROS  As noted in HPI.   Physical Exam  BP 129/85 mmHg  Pulse 78  Temp(Src) 97.6 F (36.4 C) (Tympanic)  Resp 18  Ht 5' (1.524 m)  Wt 215 lb (97.523 kg)  BMI 41.99 kg/m2  SpO2 98%  Constitutional: Well developed, well nourished, no acute distress Eyes:  EOMI, conjunctiva normal bilaterally HENT: Normocephalic, atraumatic,mucus membranes moist TMs normal bilaterally positive nasal congestion bilaterally. Erythematous, swollen turbinates. Clear rhinorrhea. No sinus tenderness. Tonsils surgically absent. Oropharynx normal. Positive postnasal drip. Lymph, no cervical lymphadenopathy Respiratory: Normal inspiratory effort good air movement, no wheezing rales rhonchi Cardiovascular: Normal rate regular rhythm no murmurs rubs gallops GI: nondistended skin: No rash, skin intact Musculoskeletal: no deformities Neurologic: Alert & oriented x 3, no focal neuro deficits Psychiatric: Speech and behavior appropriate   ED Course   Medications - No data to display  No orders of the defined types were placed in this encounter.    No results found for this or any previous visit (from the past 24 hour(s)). No results found.  ED Clinical Impression  URI (upper respiratory infection)  Rhinosinusitis   ED Assessment/Plan  Vitals normal. Doubt influenza. No indications for antibiotics at this time. We'll treat as URI with nasal steroids, continue saline nasal irrigation Mucinex D if it does not elevate her blood pressure significantly otherwise Mucinex. Tylenol, ibuprofen. Afrin  before getting on the plane tomorrow. Return here or follow up with primary care physician if double sickening or no better in 10 days. Patient agrees with plan  *This clinic note was created using Dragon dictation software. Therefore, there may be occasional mistakes despite careful proofreading.  ?   Domenick Gong, MD 09/23/15 [phone]

## 2015-11-13 ENCOUNTER — Encounter
Admission: RE | Admit: 2015-11-13 | Discharge: 2015-11-13 | Disposition: A | Discharge status: Home / Self Care | Payer: Medicare Other | Source: Ambulatory Visit | Attending: Orthopedic Surgery | Admitting: Orthopedic Surgery

## 2015-11-13 DIAGNOSIS — Z01812 Encounter for preprocedural laboratory examination: Secondary | ICD-10-CM | POA: Insufficient documentation

## 2015-11-13 LAB — URINALYSIS COMPLETE WITH MICROSCOPIC (ARMC ONLY)
Bacteria, UA: NONE SEEN
Bilirubin Urine: NEGATIVE
GLUCOSE, UA: NEGATIVE mg/dL
Hgb urine dipstick: NEGATIVE
KETONES UR: NEGATIVE mg/dL
NITRITE: NEGATIVE
PROTEIN: NEGATIVE mg/dL
SPECIFIC GRAVITY, URINE: 1.023 (ref 1.005–1.030)
pH: 6 (ref 5.0–8.0)

## 2015-11-13 LAB — PROTIME-INR
INR: 1.13
PROTHROMBIN TIME: 14.7 s (ref 11.4–15.0)

## 2015-11-13 LAB — SURGICAL PCR SCREEN
MRSA, PCR: NEGATIVE
STAPHYLOCOCCUS AUREUS: NEGATIVE

## 2015-11-13 LAB — TYPE AND SCREEN
ABO/RH(D): A POS
Antibody Screen: NEGATIVE

## 2015-11-13 LAB — APTT: APTT: 30 s (ref 24–36)

## 2015-11-13 LAB — ABO/RH: ABO/RH(D): A POS

## 2015-11-13 NOTE — Patient Instructions (Signed)
  Your procedure is scheduled on: Monday Feb. 20, 2017. Report to Same Day Surgery. To find out your arrival time please call [phone] between 1PM - 3PM on Friday Feb. 17, 2017 .  Remember: Instructions that are not followed completely may result in serious medical risk, up to and including death, or upon the discretion of your surgeon and anesthesiologist your surgery may need to be rescheduled.    _x___ 1. Do not eat food or drink liquids after midnight. No gum chewing or hard candies.     _x___ 2. No Alcohol for 24 hours before or after surgery.   ____ 3. Bring all medications with you on the day of surgery if instructed.    __x__ 4. Notify your doctor if there is any change in your medical condition     (cold, fever, infections).     Do not wear jewelry, make-up, hairpins, clips or nail polish.  Do not wear lotions, powders, or perfumes. You may wear deodorant.  Do not shave 48 hours prior to surgery. Men may shave face and neck.  Do not bring valuables to the hospital.    [facility] is not responsible for any belongings or valuables.               Contacts, dentures or bridgework may not be worn into surgery.  Leave your suitcase in the car. After surgery it may be brought to your room.  For patients admitted to the hospital, discharge time is determined by your treatment team.   Patients discharged the day of surgery will not be allowed to drive home.    Please read over the following fact sheets that you were given:   [facility] for [facility]  __x__ Take these medicines the morning of surgery with A SIP OF WATER:    1. amLODipine (NORVASC)  2. fosinopril (MONOPRIL)  3. metoprolol (LOPRESSOR)    ____ Fleet Enema (as directed)   __x__ Use CHG Soap as directed  __x__ Use inhalers on the day of surgery and bring to hospital.  ____ Stop metformin 2 days prior to surgery    ____ Take 1/2 of usual insulin dose the night before surgery and none on the  morning of surgery.   ____ Stop Coumadin/Plavix/aspirin on does not apply.  __x__ Stop Anti-inflammatories Meloxicam 3 weeks ago. OK to take Celebrex and tylenol with codeine for pain.  ____ Stop supplements until after surgery.    ____ Bring C-Pap to the hospital.

## 2015-11-15 LAB — URINE CULTURE: Special Requests: NORMAL

## 2015-11-16 NOTE — H&P (Signed)
Urine culture results sent to Dr. Hooten for review. 

## 2015-11-19 NOTE — Pre-Procedure Instructions (Signed)
Call from patient asking if she can stay on Celebrex. Verified with Dr Maisie Fus ok to continue and spoke with Rada Hay at Dr Ernest Pine office

## 2015-11-25 ENCOUNTER — Inpatient Hospital Stay: Payer: Medicare Other

## 2015-11-25 ENCOUNTER — Inpatient Hospital Stay
Admission: RE | Admit: 2015-11-25 | Discharge: 2015-11-27 | DRG: 470 | Disposition: A | Discharge status: Home Health | Payer: Medicare Other | Source: Ambulatory Visit | Attending: Orthopedic Surgery | Admitting: Orthopedic Surgery

## 2015-11-25 ENCOUNTER — Encounter
Admission: RE | Disposition: A | Discharge status: Home Health | Payer: Self-pay | Source: Ambulatory Visit | Attending: Orthopedic Surgery

## 2015-11-25 ENCOUNTER — Inpatient Hospital Stay: Payer: Medicare Other | Admitting: Anesthesiology

## 2015-11-25 ENCOUNTER — Encounter: Payer: Self-pay | Admitting: *Deleted

## 2015-11-25 DIAGNOSIS — I10 Essential (primary) hypertension: Secondary | ICD-10-CM | POA: Diagnosis present

## 2015-11-25 DIAGNOSIS — E669 Obesity, unspecified: Secondary | ICD-10-CM | POA: Diagnosis present

## 2015-11-25 DIAGNOSIS — Z79899 Other long term (current) drug therapy: Secondary | ICD-10-CM | POA: Diagnosis not present

## 2015-11-25 DIAGNOSIS — K219 Gastro-esophageal reflux disease without esophagitis: Secondary | ICD-10-CM | POA: Diagnosis present

## 2015-11-25 DIAGNOSIS — H8101 Meniere's disease, right ear: Secondary | ICD-10-CM | POA: Diagnosis present

## 2015-11-25 DIAGNOSIS — J45909 Unspecified asthma, uncomplicated: Secondary | ICD-10-CM | POA: Diagnosis present

## 2015-11-25 DIAGNOSIS — M1612 Unilateral primary osteoarthritis, left hip: Principal | ICD-10-CM | POA: Diagnosis present

## 2015-11-25 DIAGNOSIS — Z6841 Body Mass Index (BMI) 40.0 and over, adult: Secondary | ICD-10-CM | POA: Diagnosis not present

## 2015-11-25 DIAGNOSIS — Z96649 Presence of unspecified artificial hip joint: Secondary | ICD-10-CM

## 2015-11-25 HISTORY — PX: TOTAL HIP ARTHROPLASTY: SHX124

## 2015-11-25 SURGERY — ARTHROPLASTY, HIP, TOTAL,POSTERIOR APPROACH
Anesthesia: Spinal | Laterality: Left

## 2015-11-25 MED ORDER — METOPROLOL TARTRATE 50 MG PO TABS
50.0000 mg | ORAL_TABLET | ORAL | Status: DC
  Administered 2015-11-26 – 2015-11-27 (×2): 50 mg via ORAL
  Filled 2015-11-25 (×2): qty 1

## 2015-11-25 MED ORDER — MAGNESIUM HYDROXIDE 400 MG/5ML PO SUSP
30.0000 mL | Freq: Every day | ORAL | Status: DC | PRN
  Administered 2015-11-26: 30 mL via ORAL
  Filled 2015-11-25: qty 30

## 2015-11-25 MED ORDER — LISINOPRIL 20 MG PO TABS
40.0000 mg | ORAL_TABLET | ORAL | Status: DC
  Administered 2015-11-26 – 2015-11-27 (×2): 40 mg via ORAL
  Filled 2015-11-25 (×2): qty 2

## 2015-11-25 MED ORDER — MENTHOL 3 MG MT LOZG: 1.0000 | LOZENGE | OROMUCOSAL | Status: DC | PRN

## 2015-11-25 MED ORDER — LACTATED RINGERS IV SOLN
INTRAVENOUS | Status: DC
Start: 2015-11-25 — End: 2015-11-25
  Administered 2015-11-25 (×4): via INTRAVENOUS

## 2015-11-25 MED ORDER — ACETAMINOPHEN 650 MG RE SUPP: 650.0000 mg | Freq: Four times a day (QID) | RECTAL | Status: DC | PRN

## 2015-11-25 MED ORDER — HYDROCHLOROTHIAZIDE 25 MG PO TABS
25.0000 mg | ORAL_TABLET | ORAL | Status: DC
  Administered 2015-11-26 – 2015-11-27 (×2): 25 mg via ORAL
  Filled 2015-11-25 (×2): qty 1

## 2015-11-25 MED ORDER — DEXMEDETOMIDINE HCL 200 MCG/2ML IV SOLN
INTRAVENOUS | Status: DC | PRN
Start: 2015-11-25 — End: 2015-11-25
  Administered 2015-11-25: 8 ug via INTRAVENOUS

## 2015-11-25 MED ORDER — FENTANYL CITRATE (PF) 100 MCG/2ML IJ SOLN
INTRAMUSCULAR | Status: AC
  Administered 2015-11-25: 25 ug via INTRAVENOUS
  Filled 2015-11-25: qty 2

## 2015-11-25 MED ORDER — TRANEXAMIC ACID 1000 MG/10ML IV SOLN
1000.0000 mg | Freq: Once | INTRAVENOUS | Status: AC
  Administered 2015-11-25: 1000 mg via INTRAVENOUS
  Filled 2015-11-25: qty 10

## 2015-11-25 MED ORDER — BISACODYL 10 MG RE SUPP: 10.0000 mg | Freq: Every day | RECTAL | Status: DC | PRN

## 2015-11-25 MED ORDER — PHENOL 1.4 % MT LIQD: 1.0000 | OROMUCOSAL | Status: DC | PRN

## 2015-11-25 MED ORDER — FENTANYL CITRATE (PF) 100 MCG/2ML IJ SOLN
INTRAMUSCULAR | Status: DC | PRN
Start: 2015-11-25 — End: 2015-11-25
  Administered 2015-11-25 (×2): 50 ug via INTRAVENOUS

## 2015-11-25 MED ORDER — KETAMINE HCL 10 MG/ML IJ SOLN
INTRAMUSCULAR | Status: DC | PRN
  Administered 2015-11-25 (×3): 10 mg via INTRAVENOUS

## 2015-11-25 MED ORDER — OXYCODONE HCL 5 MG PO TABS: 5.0000 mg | ORAL_TABLET | ORAL | Status: DC | PRN

## 2015-11-25 MED ORDER — EPHEDRINE SULFATE 50 MG/ML IJ SOLN
INTRAMUSCULAR | Status: DC | PRN
  Administered 2015-11-25 (×2): 10 mg via INTRAVENOUS

## 2015-11-25 MED ORDER — MIDAZOLAM HCL 5 MG/5ML IJ SOLN
INTRAMUSCULAR | Status: DC | PRN
  Administered 2015-11-25 (×2): 1 mg via INTRAVENOUS

## 2015-11-25 MED ORDER — AMLODIPINE BESYLATE 5 MG PO TABS
5.0000 mg | ORAL_TABLET | ORAL | Status: DC
  Administered 2015-11-26 – 2015-11-27 (×2): 5 mg via ORAL
  Filled 2015-11-25 (×2): qty 1

## 2015-11-25 MED ORDER — HYDROCODONE-ACETAMINOPHEN 5-325 MG PO TABS
1.0000 | ORAL_TABLET | ORAL | Status: DC | PRN
  Administered 2015-11-25 – 2015-11-27 (×4): 1 via ORAL
  Filled 2015-11-25 (×4): qty 1

## 2015-11-25 MED ORDER — GABAPENTIN 600 MG PO TABS
600.0000 mg | ORAL_TABLET | Freq: Every day | ORAL | Status: DC
  Administered 2015-11-25 – 2015-11-26 (×2): 600 mg via ORAL
  Filled 2015-11-25 (×2): qty 1

## 2015-11-25 MED ORDER — ACETAMINOPHEN 10 MG/ML IV SOLN
INTRAVENOUS | Status: AC
  Filled 2015-11-25: qty 100

## 2015-11-25 MED ORDER — ALUM & MAG HYDROXIDE-SIMETH 200-200-20 MG/5ML PO SUSP: 30.0000 mL | ORAL | Status: DC | PRN

## 2015-11-25 MED ORDER — ACETAMINOPHEN 10 MG/ML IV SOLN
INTRAVENOUS | Status: DC | PRN
  Administered 2015-11-25: 1000 mg via INTRAVENOUS

## 2015-11-25 MED ORDER — FENTANYL CITRATE (PF) 100 MCG/2ML IJ SOLN
25.0000 ug | INTRAMUSCULAR | Status: AC | PRN
  Administered 2015-11-25 (×6): 25 ug via INTRAVENOUS

## 2015-11-25 MED ORDER — DIPHENHYDRAMINE HCL 12.5 MG/5ML PO ELIX: 12.5000 mg | ORAL_SOLUTION | ORAL | Status: DC | PRN

## 2015-11-25 MED ORDER — FOSINOPRIL SODIUM 20 MG PO TABS: 40.0000 mg | ORAL_TABLET | ORAL | Status: DC

## 2015-11-25 MED ORDER — HYDROMORPHONE HCL 1 MG/ML IJ SOLN
INTRAMUSCULAR | Status: AC
  Administered 2015-11-25: 0.25 mg via INTRAVENOUS
  Filled 2015-11-25: qty 1

## 2015-11-25 MED ORDER — SENNOSIDES-DOCUSATE SODIUM 8.6-50 MG PO TABS
1.0000 | ORAL_TABLET | Freq: Two times a day (BID) | ORAL | Status: DC
  Administered 2015-11-25 – 2015-11-26 (×3): 1 via ORAL
  Filled 2015-11-25 (×5): qty 1

## 2015-11-25 MED ORDER — CELECOXIB 200 MG PO CAPS
200.0000 mg | ORAL_CAPSULE | ORAL | Status: DC
  Administered 2015-11-26 – 2015-11-27 (×2): 200 mg via ORAL
  Filled 2015-11-25 (×2): qty 1

## 2015-11-25 MED ORDER — ACETAMINOPHEN 325 MG PO TABS: 650.0000 mg | ORAL_TABLET | Freq: Four times a day (QID) | ORAL | Status: DC | PRN

## 2015-11-25 MED ORDER — NEOMYCIN-POLYMYXIN B GU 40-200000 IR SOLN
Status: AC
  Filled 2015-11-25: qty 20

## 2015-11-25 MED ORDER — CEFAZOLIN SODIUM-DEXTROSE 2-3 GM-% IV SOLR
INTRAVENOUS | Status: AC
  Filled 2015-11-25: qty 50

## 2015-11-25 MED ORDER — BUDESONIDE 0.25 MG/2ML IN SUSP
0.2500 mg | Freq: Two times a day (BID) | RESPIRATORY_TRACT | Status: DC
  Filled 2015-11-25: qty 2

## 2015-11-25 MED ORDER — CLONAZEPAM 0.5 MG PO TABS
0.5000 mg | ORAL_TABLET | Freq: Every day | ORAL | Status: DC
  Administered 2015-11-25 – 2015-11-26 (×2): 0.5 mg via ORAL
  Filled 2015-11-25 (×2): qty 1

## 2015-11-25 MED ORDER — BUPIVACAINE IN DEXTROSE 0.75-8.25 % IT SOLN
INTRATHECAL | Status: DC | PRN
Start: 2015-11-25 — End: 2015-11-25
  Administered 2015-11-25: 1.6 mL via INTRATHECAL

## 2015-11-25 MED ORDER — CEFAZOLIN SODIUM-DEXTROSE 2-3 GM-% IV SOLR
2.0000 g | Freq: Four times a day (QID) | INTRAVENOUS | Status: AC
  Administered 2015-11-25 – 2015-11-26 (×4): 2 g via INTRAVENOUS
  Filled 2015-11-25 (×4): qty 50

## 2015-11-25 MED ORDER — CEFAZOLIN SODIUM-DEXTROSE 2-3 GM-% IV SOLR
2.0000 g | Freq: Once | INTRAVENOUS | Status: AC
  Administered 2015-11-25: 2 g via INTRAVENOUS

## 2015-11-25 MED ORDER — ADULT MULTIVITAMIN W/MINERALS CH
1.0000 | ORAL_TABLET | ORAL | Status: DC
  Administered 2015-11-26 – 2015-11-27 (×2): 1 via ORAL
  Filled 2015-11-25 (×2): qty 1

## 2015-11-25 MED ORDER — FAMOTIDINE 20 MG PO TABS
ORAL_TABLET | ORAL | Status: AC
  Administered 2015-11-25: 20 mg via ORAL
  Filled 2015-11-25: qty 1

## 2015-11-25 MED ORDER — MORPHINE SULFATE (PF) 2 MG/ML IV SOLN
2.0000 mg | INTRAVENOUS | Status: DC | PRN
  Administered 2015-11-25 (×2): 2 mg via INTRAVENOUS
  Filled 2015-11-25 (×2): qty 1

## 2015-11-25 MED ORDER — METOCLOPRAMIDE HCL 10 MG PO TABS
10.0000 mg | ORAL_TABLET | Freq: Three times a day (TID) | ORAL | Status: DC
  Administered 2015-11-25 – 2015-11-27 (×7): 10 mg via ORAL
  Filled 2015-11-25 (×7): qty 1

## 2015-11-25 MED ORDER — ONDANSETRON HCL 4 MG PO TABS: 4.0000 mg | ORAL_TABLET | Freq: Four times a day (QID) | ORAL | Status: DC | PRN

## 2015-11-25 MED ORDER — SODIUM CHLORIDE 0.9 % IV SOLN
INTRAVENOUS | Status: DC
  Administered 2015-11-25: 13:00:00 via INTRAVENOUS

## 2015-11-25 MED ORDER — ENOXAPARIN SODIUM 30 MG/0.3ML ~~LOC~~ SOLN
30.0000 mg | Freq: Two times a day (BID) | SUBCUTANEOUS | Status: DC
  Administered 2015-11-26 – 2015-11-27 (×3): 30 mg via SUBCUTANEOUS
  Filled 2015-11-25 (×3): qty 0.3

## 2015-11-25 MED ORDER — FERROUS SULFATE 325 (65 FE) MG PO TABS
325.0000 mg | ORAL_TABLET | Freq: Two times a day (BID) | ORAL | Status: DC
  Administered 2015-11-26: 325 mg via ORAL
  Filled 2015-11-25 (×3): qty 1

## 2015-11-25 MED ORDER — PROPOFOL 10 MG/ML IV BOLUS
INTRAVENOUS | Status: DC | PRN
  Administered 2015-11-25 (×2): 20 mg via INTRAVENOUS

## 2015-11-25 MED ORDER — HYDROMORPHONE HCL 1 MG/ML IJ SOLN
0.2500 mg | INTRAMUSCULAR | Status: DC | PRN
  Administered 2015-11-25 (×2): 0.5 mg via INTRAVENOUS
  Administered 2015-11-25 (×2): 0.25 mg via INTRAVENOUS

## 2015-11-25 MED ORDER — FLEET ENEMA 7-19 GM/118ML RE ENEM: 1.0000 | ENEMA | Freq: Once | RECTAL | Status: DC | PRN

## 2015-11-25 MED ORDER — FAMOTIDINE 20 MG PO TABS
20.0000 mg | ORAL_TABLET | Freq: Once | ORAL | Status: AC
  Administered 2015-11-25: 20 mg via ORAL

## 2015-11-25 MED ORDER — PROPOFOL 500 MG/50ML IV EMUL
INTRAVENOUS | Status: DC | PRN
  Administered 2015-11-25: 75 ug/kg/min via INTRAVENOUS

## 2015-11-25 MED ORDER — BUDESONIDE 0.25 MG/2ML IN SUSP: 0.2500 mg | Freq: Two times a day (BID) | RESPIRATORY_TRACT | Status: DC

## 2015-11-25 MED ORDER — ZOLPIDEM TARTRATE 5 MG PO TABS
5.0000 mg | ORAL_TABLET | Freq: Every day | ORAL | Status: DC
  Administered 2015-11-25 – 2015-11-26 (×2): 5 mg via ORAL
  Filled 2015-11-25 (×2): qty 1

## 2015-11-25 MED ORDER — BUDESONIDE 0.25 MG/2ML IN SUSP: 0.2500 mg | Freq: Two times a day (BID) | RESPIRATORY_TRACT | Status: DC | PRN

## 2015-11-25 MED ORDER — TRANEXAMIC ACID 1000 MG/10ML IV SOLN
1000.0000 mg | INTRAVENOUS | Status: DC
  Filled 2015-11-25: qty 10

## 2015-11-25 MED ORDER — TRAMADOL HCL 50 MG PO TABS
50.0000 mg | ORAL_TABLET | ORAL | Status: DC | PRN
  Administered 2015-11-25: 50 mg via ORAL
  Administered 2015-11-26: 100 mg via ORAL
  Administered 2015-11-26: 50 mg via ORAL
  Filled 2015-11-25: qty 2
  Filled 2015-11-25: qty 1
  Filled 2015-11-25: qty 2
  Filled 2015-11-25: qty 1

## 2015-11-25 MED ORDER — NEOMYCIN-POLYMYXIN B GU 40-200000 IR SOLN
Status: DC | PRN
  Administered 2015-11-25: 12 mL

## 2015-11-25 MED ORDER — SODIUM CHLORIDE 0.9 % IV SOLN
10000.0000 ug | INTRAVENOUS | Status: DC | PRN
  Administered 2015-11-25: 20 ug/min via INTRAVENOUS

## 2015-11-25 MED ORDER — ONDANSETRON HCL 4 MG/2ML IJ SOLN: 4.0000 mg | Freq: Four times a day (QID) | INTRAMUSCULAR | Status: DC | PRN

## 2015-11-25 MED ORDER — ACETAMINOPHEN 10 MG/ML IV SOLN
1000.0000 mg | Freq: Four times a day (QID) | INTRAVENOUS | Status: AC
  Administered 2015-11-25 – 2015-11-26 (×4): 1000 mg via INTRAVENOUS
  Filled 2015-11-25 (×4): qty 100

## 2015-11-25 MED ORDER — ONDANSETRON HCL 4 MG/2ML IJ SOLN: 4.0000 mg | Freq: Once | INTRAMUSCULAR | Status: DC | PRN

## 2015-11-25 MED ORDER — PHENYLEPHRINE HCL 10 MG/ML IJ SOLN
INTRAMUSCULAR | Status: DC | PRN
  Administered 2015-11-25: 100 ug via INTRAVENOUS

## 2015-11-25 MED ORDER — HYDROMORPHONE HCL 1 MG/ML IJ SOLN
INTRAMUSCULAR | Status: AC
  Filled 2015-11-25: qty 1

## 2015-11-25 MED ORDER — ONDANSETRON HCL 4 MG/2ML IJ SOLN
INTRAMUSCULAR | Status: DC | PRN
  Administered 2015-11-25: 4 mg via INTRAVENOUS

## 2015-11-25 SURGICAL SUPPLY — 51 items
BLADE DRUM FLTD (BLADE) ×3 IMPLANT
BLADE SAW 1 (BLADE) ×3 IMPLANT
CANISTER SUCT 1200ML W/VALVE (MISCELLANEOUS) ×3 IMPLANT
CANISTER SUCT 3000ML (MISCELLANEOUS) ×6 IMPLANT
CAPT HIP TOTAL 2 ×3 IMPLANT
CARTRIDGE OIL MAESTRO DRILL (MISCELLANEOUS) ×1 IMPLANT
CATH FOL LEG HOLDER (MISCELLANEOUS) ×3 IMPLANT
CATH TRAY METER 16FR LF (MISCELLANEOUS) ×3 IMPLANT
DIFFUSER MAESTRO (MISCELLANEOUS) ×3 IMPLANT
DRAPE INCISE IOBAN 66X60 STRL (DRAPES) ×3 IMPLANT
DRAPE SHEET LG 3/4 BI-LAMINATE (DRAPES) ×3 IMPLANT
DRAPE TABLE BACK 80X90 (DRAPES) ×3 IMPLANT
DRSG DERMACEA 8X12 NADH (GAUZE/BANDAGES/DRESSINGS) ×3 IMPLANT
DRSG OPSITE POSTOP 3X4 (GAUZE/BANDAGES/DRESSINGS) ×3 IMPLANT
DRSG OPSITE POSTOP 4X12 (GAUZE/BANDAGES/DRESSINGS) ×3 IMPLANT
DRSG OPSITE POSTOP 4X14 (GAUZE/BANDAGES/DRESSINGS) ×3 IMPLANT
DRSG TEGADERM 4X4.75 (GAUZE/BANDAGES/DRESSINGS) ×3 IMPLANT
DURAPREP 26ML APPLICATOR (WOUND CARE) ×3 IMPLANT
ELECT BLADE 6.5 EXT (BLADE) ×3 IMPLANT
ELECT CAUTERY BLADE 6.4 (BLADE) ×3 IMPLANT
GLOVE BIOGEL M STRL SZ7.5 (GLOVE) ×3 IMPLANT
GLOVE INDICATOR 8.0 STRL GRN (GLOVE) ×3 IMPLANT
GLOVE SURG 9.0 ORTHO LTXF (GLOVE) ×3 IMPLANT
GLOVE SURG ORTHO 9.0 STRL STRW (GLOVE) ×3 IMPLANT
GOWN STRL REUS W/ TWL LRG LVL3 (GOWN DISPOSABLE) ×2 IMPLANT
GOWN STRL REUS W/TWL 2XL LVL3 (GOWN DISPOSABLE) ×3 IMPLANT
GOWN STRL REUS W/TWL LRG LVL3 (GOWN DISPOSABLE) ×4
HANDPIECE SUCTION TUBG SURGILV (MISCELLANEOUS) ×3 IMPLANT
HEMOVAC 400CC 10FR (MISCELLANEOUS) ×3 IMPLANT
HOOD PEEL AWAY FLYTE STAYCOOL (MISCELLANEOUS) ×6 IMPLANT
KIT RM TURNOVER STRD PROC AR (KITS) ×3 IMPLANT
NDL SAFETY 18GX1.5 (NEEDLE) ×3 IMPLANT
NS IRRIG 1000ML POUR BTL (IV SOLUTION) ×3 IMPLANT
OIL CARTRIDGE MAESTRO DRILL (MISCELLANEOUS) ×3
PACK HIP PROSTHESIS (MISCELLANEOUS) ×3 IMPLANT
PIN STEIN THRED 5/32 (Pin) ×3 IMPLANT
SOL .9 NS 3000ML IRR  AL (IV SOLUTION) ×2
SOL .9 NS 3000ML IRR UROMATIC (IV SOLUTION) ×1 IMPLANT
SOL PREP PVP 2OZ (MISCELLANEOUS) ×3
SOLUTION PREP PVP 2OZ (MISCELLANEOUS) ×1 IMPLANT
SPONGE DRAIN TRACH 4X4 STRL 2S (GAUZE/BANDAGES/DRESSINGS) ×3 IMPLANT
STAPLER SKIN PROX 35W (STAPLE) ×3 IMPLANT
SUT ETHIBOND #5 BRAIDED 30INL (SUTURE) ×3 IMPLANT
SUT VIC AB 0 CT1 36 (SUTURE) ×3 IMPLANT
SUT VIC AB 1 CT1 36 (SUTURE) ×6 IMPLANT
SUT VIC AB 2-0 CT1 27 (SUTURE) ×2
SUT VIC AB 2-0 CT1 TAPERPNT 27 (SUTURE) ×1 IMPLANT
SYR 20CC LL (SYRINGE) ×3 IMPLANT
TAPE ADH 3 LX (MISCELLANEOUS) ×3 IMPLANT
TAPE TRANSPORE STRL 2 31045 (GAUZE/BANDAGES/DRESSINGS) ×3 IMPLANT
WATER STERILE IRR 1000ML POUR (IV SOLUTION) ×6 IMPLANT

## 2015-11-25 NOTE — NC FL2 (Signed)
[place] MEDICAID FL2 LEVEL OF CARE SCREENING TOOL     IDENTIFICATION  Patient Name: Katrina Poole Birthdate: 1946/09/17 Sex: female Admission Date (Current Location): 11/25/2015  [place] and [place] Number:  Chiropodist and Address:  [facility], [address], [place], [place] [address]      Provider Number: [id number]  Attending Physician Name and Address:  Donato Heinz, MD  Relative Name and Phone Number:       Current Level of Care: Hospital Recommended Level of Care: Skilled Nursing Facility Prior Approval Number:    Date Approved/Denied:   PASRR Number:  [id number])  Discharge Plan: SNF    Current Diagnoses: Patient Active Problem List   Diagnosis Date Noted  . 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 )                       Personal Care Assistance Level of Assistance  Bathing, Feeding, Dressing Bathing Assistance: Limited assistance Feeding assistance: Independent Dressing Assistance: Limited assistance     Functional Limitations Info  Sight, Hearing, Speech Sight Info: Adequate Hearing Info: Adequate Speech Info: Adequate    SPECIAL CARE FACTORS FREQUENCY  PT (By licensed PT), OT (By licensed OT)     PT Frequency:  (5) OT Frequency:  (5)            Contractures      Additional Factors Info  Code Status, Allergies Code Status Info:  (Full Code. ) Allergies Info:  (Beef-derived Products, Sulfa Antibiotics,  Zithromax Azithromycin)           Current Medications (11/25/2015):  This is the current hospital active medication list Current Facility-Administered Medications  Medication Dose Route Frequency Provider Last Rate Last Dose  . 0.9 %  sodium chloride infusion   Intravenous Continuous Donato Heinz, MD      . acetaminophen (OFIRMEV) IV 1,000 mg  1,000 mg Intravenous 4 times per day Donato Heinz, MD      . acetaminophen (TYLENOL) tablet 650 mg  650 mg Oral Q6H PRN Donato Heinz, MD       Or  . acetaminophen (TYLENOL) suppository 650 mg  650 mg Rectal Q6H PRN Donato Heinz, MD      . alum & mag hydroxide-simeth (MAALOX/MYLANTA) 200-200-20 MG/5ML suspension 30 mL  30 mL Oral Q4H PRN Donato Heinz, MD      . Melene Muller ON 11/26/2015] amLODipine (NORVASC) tablet 5 mg  5 mg Oral BH-q7a Donato Heinz, MD      . bisacodyl (DULCOLAX) suppository 10 mg  10 mg Rectal Daily PRN Donato Heinz, MD      . budesonide (PULMICORT) nebulizer solution 0.25 mg  0.25 mg Nebulization BID Donato Heinz, MD      . ceFAZolin (ANCEF) 2-3 GM-% IVPB SOLR           . ceFAZolin (ANCEF) IVPB 2 g/50 mL  premix  2 g Intravenous Q6H Donato Heinz, MD      . Melene Muller ON 11/26/2015] celecoxib (CELEBREX) capsule 200 mg  200 mg Oral BH-q7a Donato Heinz, MD      . clonazePAM (KLONOPIN) tablet 0.5 mg  0.5 mg Oral QHS Donato Heinz, MD      . diphenhydrAMINE (BENADRYL) 12.5 MG/5ML elixir 12.5-25 mg  12.5-25 mg Oral Q4H PRN Donato Heinz, MD      . Melene Muller ON 11/26/2015] enoxaparin (LOVENOX) injection 30 mg  30 mg Subcutaneous Q12H Donato Heinz, MD      . ferrous sulfate tablet 325 mg  325 mg Oral BID WC Donato Heinz, MD      . gabapentin (NEURONTIN) capsule 600 mg  600 mg Oral QHS Donato Heinz, MD      . Melene Muller ON 11/26/2015] hydrochlorothiazide (HYDRODIURIL) tablet 25 mg  25 mg Oral BH-q7a Donato Heinz, MD      . HYDROmorphone (DILAUDID) 1 MG/ML injection           . magnesium hydroxide (MILK OF MAGNESIA) suspension 30  mL  30 mL Oral Daily PRN Donato Heinz, MD      . menthol-cetylpyridinium (CEPACOL) lozenge 3 mg  1 lozenge Oral PRN Donato Heinz, MD       Or  . phenol (CHLORASEPTIC) mouth spray 1 spray  1 spray Mouth/Throat PRN Donato Heinz, MD      . metoCLOPramide (REGLAN) tablet 10 mg  10 mg Oral TID AC & HS Donato Heinz, MD      . Melene Muller ON 11/26/2015] metoprolol (LOPRESSOR) tablet 50 mg  50 mg Oral BH-q7a Donato Heinz, MD      . morphine 2 MG/ML injection 2 mg  2 mg Intravenous Q2H PRN Donato Heinz, MD      . Melene Muller ON 11/26/2015] multivitamin with minerals tablet 1 tablet  1 tablet Oral BH-q7a Donato Heinz, MD      . ondansetron (ZOFRAN) tablet 4 mg  4 mg Oral Q6H PRN Donato Heinz, MD       Or  . ondansetron (ZOFRAN) injection 4 mg  4 mg Intravenous Q6H PRN Donato Heinz, MD      . oxyCODONE (Oxy IR/ROXICODONE) immediate release tablet 5-10 mg  5-10 mg Oral Q4H PRN Donato Heinz, MD      . senna-docusate (Senokot-S) tablet 1 tablet  1 tablet Oral BID Donato Heinz, MD      . sodium phosphate (FLEET) 7-19 GM/118ML enema 1 enema  1 enema Rectal Once PRN Donato Heinz, MD      . traMADol Janean Sark) tablet 50-100 mg  50-100 mg Oral Q4H PRN Donato Heinz, MD      . zolpidem (AMBIEN) tablet 5 mg  5 mg Oral QHS Donato Heinz, MD         Discharge Medications: Please see discharge summary for a list of discharge medications.  Relevant Imaging Results:  Relevant Lab Results:   Additional Information  (SSN: [id number])  Haig Prophet, LCSW

## 2015-11-25 NOTE — Progress Notes (Signed)
Pt doing well with current pain medications.  She was able to ambulate to the chair.  Husband is supportive

## 2015-11-25 NOTE — Brief Op Note (Signed)
11/25/2015  11:15 AM  PATIENT:  Moriya G Perrell  70 y.o. female  PRE-OPERATIVE DIAGNOSIS:  Degenerative arthrosis of the left hip  POST-OPERATIVE DIAGNOSIS:  same  PROCEDURE:  Procedure(s): TOTAL HIP ARTHROPLASTY (Left)  SURGEON:  Surgeon(s) and Role:    * Donato Heinz, MD - Primary  ASSISTANTS: Van Clines, PA   ANESTHESIA:   spinal  EBL:  Total I/O In: 2300 [I.V.:2300] Out: 550 [Urine:300; Blood:250]  BLOOD ADMINISTERED:none  DRAINS: 2 medium Hemovac drains   LOCAL MEDICATIONS USED:  NONE  SPECIMEN:  Source of Specimen:  Left femoral head  DISPOSITION OF SPECIMEN:  PATHOLOGY  COUNTS:  YES  TOURNIQUET:  * No tourniquets in log *  DICTATION: .Dragon Dictation  PLAN OF CARE: Admit to inpatient   PATIENT DISPOSITION:  PACU - hemodynamically stable.   Delay start of Pharmacological VTE agent (>24hrs) due to surgical blood loss or risk of bleeding: yes

## 2015-11-25 NOTE — Care Management Note (Addendum)
Case Management Note  Patient Details  Name: Katrina Poole MRN: [id number] Date of Birth: [birth year]  Subjective/Objective:     69yo [name] was admitted 11/25/15 for a planned left THR by Dr Ernest Pine. Hx; Previous knee surgery, HTN, Asthma, GERD, Vertigo, Meniere Syndrome (deaf in right ear). Has a rolling walker and refuses offer of a BSC. Husband has pictures of the 6 steps into their home and both he and [name] stated that with husbands assistance [name] could negotiate these steps after surgery. [name] is insistent that she will return home with home health provided by [facility]. She has used [facility] in the past. Anibal Henderson at [facility] was notified by this Clinical research associate. PCP=Dr [facility]. Pharmacy=[facility] in [place]. Lovenox  SQ daily x 14 days called to [facility]. Case management will follow for discharge planning.                Action/Plan:   Expected Discharge Date:                  Expected Discharge Plan:     In-House Referral:     Discharge planning Services     Post Acute Care Choice:    Choice offered to:     DME Arranged:    DME Agency:     HH Arranged:    HH Agency:     Status of Service:     Medicare Important Message Given:    Date Medicare IM Given:    Medicare IM give by:    Date Additional Medicare IM Given:    Additional Medicare Important Message give by:     If discussed at Long Length of Stay Meetings, dates discussed:    Additional Comments:  Lilit Cinelli A, RN 11/25/2015, 1:57 PM

## 2015-11-25 NOTE — Anesthesia Preprocedure Evaluation (Addendum)
Anesthesia Evaluation  Patient identified by MRN, date of birth, ID band Patient awake    Reviewed: Allergy & Precautions, NPO status , Patient's Chart, lab work & pertinent test results, reviewed documented beta blocker date and time   Airway Mallampati: III  TM Distance: >3 FB     Dental  (+) Chipped, Upper Dentures, Partial Lower   Pulmonary asthma , former smoker,           Cardiovascular hypertension, Pt. on medications and Pt. on home beta blockers      Neuro/Psych  Neuromuscular disease    GI/Hepatic GERD  ,  Endo/Other    Renal/GU      Musculoskeletal  (+) Arthritis ,   Abdominal   Peds  Hematology   Anesthesia Other Findings Obese.  Reproductive/Obstetrics                            Anesthesia Physical Anesthesia Plan  ASA: III  Anesthesia Plan: Spinal   Post-op Pain Management:    Induction:   Airway Management Planned: Oral ETT  Additional Equipment:   Intra-op Plan:   Post-operative Plan:   Informed Consent: I have reviewed the patients History and Physical, chart, labs and discussed the procedure including the risks, benefits and alternatives for the proposed anesthesia with the patient or authorized representative who has indicated his/her understanding and acceptance.     Plan Discussed with: CRNA  Anesthesia Plan Comments:         Anesthesia Quick Evaluation

## 2015-11-25 NOTE — H&P (Signed)
The patient has been re-examined, and the chart reviewed, and there have been no interval changes to the documented history and physical.    The risks, benefits, and alternatives have been discussed at length. The patient expressed understanding of the risks benefits and agreed with plans for surgical intervention.  Tylee Newby P. Aarsh Fristoe, Jr. M.D.    

## 2015-11-25 NOTE — Evaluation (Signed)
Physical Therapy Evaluation Patient Details Name: Katrina Poole MRN: [id number] DOB: 01/21/1946 Today's Date: 11/25/2015   History of Present Illness  Pt underwent L THR posterior approach without reported post-op complications. POD#0 at time of PT evaluation  Clinical Impression  Pt demonstrates good management of pain and excellent participation with therapy on this date. She requires minA+1 for bed mobility and is CGA only for transfers and limited ambulation from bed to recliner. No reported increase in pain with mobility and good LLE strength with mobility. Pt does require assist for L SLR due to decreased L hip flexion strength. Educated about hip precautions and reminded throughout session. Pt will be safe to discharge home with husband and HH PT. Pt will benefit from skilled PT services to address deficits in strength, balance, and mobility in order to return to full function at home.     Follow Up Recommendations Home health PT    Equipment Recommendations  None recommended by PT    Recommendations for Other Services       Precautions / Restrictions Precautions Precautions: Posterior Hip Precaution Booklet Issued: Yes (comment) Restrictions Weight Bearing Restrictions: Yes LLE Weight Bearing: Weight bearing as tolerated      Mobility  Bed Mobility Overal bed mobility: Needs Assistance Bed Mobility: Supine to Sit     Supine to sit: Min assist     General bed mobility comments: Pt requires cues to avoid L hip adduction across midline as well as excessive L hip flexion during supine to sit. Pt demonstrates good strength and sequencing. Needs minA for trunk to go from supine to sitting  Transfers Overall transfer level: Needs assistance Equipment used: Rolling walker (2 wheeled) Transfers: Sit to/from Stand Sit to Stand: Min guard         General transfer comment: Pt demonstrates excellent weight acceptance to LLE and good LE strength with sit to stand  transfers. Safe hand placement without cues. No reported increase in pain with transfers  Ambulation/Gait Ambulation/Gait assistance: Min guard Ambulation Distance (Feet): 3 Feet Assistive device: Rolling walker (2 wheeled) Gait Pattern/deviations: Decreased step length - right;Decreased stance time - left;Decreased weight shift to left Gait velocity: Decreased Gait velocity interpretation: <1.8 ft/sec, indicative of risk for recurrent falls General Gait Details: Pt able to take small steps from bed to recliner. Demonstrates proper sequencing with walker when provided cues. No reported increase in pain during ambulation and does not appear to excessively rely on UE support on walker. No reported DOE and no evidence for respiratory distress  Stairs            Wheelchair Mobility    Modified Rankin (Stroke Patients Only)       Balance                                             Pertinent Vitals/Pain Pain Assessment: 0-10 Pain Score: 5  Pain Location: L hip Pain Intervention(s): Limited activity within patient's tolerance;Monitored during session;Premedicated before session    Home Living Family/patient expects to be discharged to:: Private residence Living Arrangements: Spouse/significant other Available Help at Discharge: Family Type of Home: House Home Access: Stairs to enter Entrance Stairs-Rails: Left Entrance Stairs-Number of Steps: 6 Home Layout: One level Home Equipment: Cane - single point;Walker - 2 wheels;Shower seat (no BSC)      Prior Function Level of Independence: Independent with assistive  device(s)         Comments: Typically uses spc for community ambulation. Has been using rolling walker for the last week due to increased hip pain with transfers at job     Hand Dominance   Dominant Hand: Right    Extremity/Trunk Assessment   Upper Extremity Assessment: Overall WFL for tasks assessed           Lower Extremity  Assessment: LLE deficits/detail   LLE Deficits / Details: Pt requires assist for SLR. Good L quad contraction and full DF/PF on LLE.      Communication   Communication: No difficulties  Cognition Arousal/Alertness: Awake/alert Behavior During Therapy: WFL for tasks assessed/performed Overall Cognitive Status: Within Functional Limits for tasks assessed                      General Comments      Exercises Total Joint Exercises Ankle Circles/Pumps: Strengthening;Both;10 reps;Supine Quad Sets: Strengthening;Both;10 reps;Supine Gluteal Sets: Strengthening;Both;10 reps;Supine Towel Squeeze: Strengthening;Both;10 reps;Supine Short Arc Quad: Strengthening;Left;10 reps;Supine Heel Slides: Strengthening;Left;10 reps;Supine Hip ABduction/ADduction: Strengthening;Left;10 reps;Supine Straight Leg Raises: Strengthening;Left;10 reps;Supine      Assessment/Plan    PT Assessment Patient needs continued PT services  PT Diagnosis Difficulty walking;Abnormality of gait;Generalized weakness;Acute pain   PT Problem List Decreased strength;Decreased activity tolerance;Decreased mobility;Pain;Obesity;Decreased balance  PT Treatment Interventions DME instruction;Gait training;Stair training;Therapeutic activities;Therapeutic exercise;Balance training;Neuromuscular re-education;Patient/family education;Manual techniques   PT Goals (Current goals can be found in the Care Plan section) Acute Rehab PT Goals Patient Stated Goal: Improve function at home with less L hip pain PT Goal Formulation: With patient/family Time For Goal Achievement: 12/09/15 Potential to Achieve Goals: Good    Frequency BID   Barriers to discharge        Co-evaluation               End of Session Equipment Utilized During Treatment: Gait belt Activity Tolerance: Patient tolerated treatment well Patient left: in chair;with call bell/phone within reach;with chair alarm set;with SCD's reapplied;with  family/visitor present (Abduction pillows in place. RN notified need for ice pack) Nurse Communication: Mobility status;Other (comment) (Pt needs ice pack)         Time: 8119-1478 PT Time Calculation (min) (ACUTE ONLY): 28 min   Charges:   PT Evaluation $PT Eval Low Complexity: 1 Procedure PT Treatments $Therapeutic Exercise: 8-22 mins   PT G Codes:       Sharalyn Ink Teandre Hamre PT, DPT   Cianni Manny 11/25/2015, 3:50 PM

## 2015-11-25 NOTE — Op Note (Signed)
OPERATIVE NOTE  DATE OF SURGERY:  11/25/2015  PATIENT NAME:  Katrina Poole   DOB: 1946-05-15  MRN: [id number]  PRE-OPERATIVE DIAGNOSIS: Degenerative arthrosis of the left hip, primary  POST-OPERATIVE DIAGNOSIS:  Same  PROCEDURE:  Left total hip arthroplasty  SURGEON:  Jena Gauss. M.D.  ASSISTANT:  Van Clines, PA (present and scrubbed throughout the case, critical for assistance with exposure, retraction, instrumentation, and closure)  ANESTHESIA: spinal  ESTIMATED BLOOD LOSS: 250 mL  FLUIDS REPLACED: 2300 mL of crystalloid  DRAINS: 2 medium drains to a Hemovac reservoir  IMPLANTS UTILIZED: DePuy 13.5 mm small stature AML femoral stem, 48 mm OD Pinnacle 100 acetabular component, neutral Pinnacle Altrx polyethylene insert, and a 32 mm CoCr +1 mm hip ball  INDICATIONS FOR SURGERY: Katrina Poole is a 70 y.o. year old female with a long history of progressive hip and groin  pain. X-rays demonstrated severe degenerative changes. The patient had not seen any significant improvement despite conservative nonsurgical intervention. After discussion of the risks and benefits of surgical intervention, the patient expressed understanding of the risks benefits and agree with plans for total hip arthroplasty.   The risks, benefits, and alternatives were discussed at length including but not limited to the risks of infection, bleeding, nerve injury, stiffness, blood clots, the need for revision surgery, limb length inequality, dislocation, cardiopulmonary complications, among others, and they were willing to proceed.  PROCEDURE IN DETAIL: The patient was brought into the operating room and, after adequate spinal anesthesia was achieved, the patient was placed in a right lateral decubitus position. Axillary roll was placed and all bony prominences were well-padded. The patient's left hip was cleaned and prepped with alcohol and DuraPrep and draped in the usual sterile fashion. A "timeout"  was performed as per usual protocol. A lateral curvilinear incision was made gently curving towards the posterior superior iliac spine. The IT band was incised in line with the skin incision and the fibers of the gluteus maximus were split in line. The piriformis tendon was identified, skeletonized, and incised at its insertion to the proximal femur and reflected posteriorly. A T type posterior capsulotomy was performed. Prior to dislocation of the femoral head, a threaded Steinmann pin was inserted through a separate stab incision into the pelvis superior to the acetabulum and bent in the form of a stylus so as to assess limb length and hip offset throughout the procedure. The femoral head was then dislocated posteriorly. Inspection of the femoral head demonstrated severe degenerative changes with full-thickness loss of articular cartilage. The femoral neck cut was performed using an oscillating saw. The anterior capsule was elevated off of the femoral neck using a periosteal elevator. Attention was then directed to the acetabulum. The remnant of the labrum was excised using electrocautery. Inspection of the acetabulum also demonstrated significant degenerative changes. The acetabulum was reamed in sequential fashion up to a 47 mm diameter. Good punctate bleeding bone was encountered. A 48 mm Pinnacle 100 acetabular component was positioned and impacted into place. Good scratch fit was appreciated. A neutral polyethylene trial was inserted.  Attention was then directed to the proximal femur. A hole for reaming of the proximal femoral canal was created using a high-speed burr. The femoral canal was reamed in sequential fashion up to a 13 mm diameter. This allowed for approximately 5-1/2 cm of scratch fit. Serial broaches were inserted up to a 13.5 mm small stature femoral broach. Calcar region was planed and a trial reduction was performed  using a 32 mm hip ball with a +1 mm neck length. Good equalization of limb  lengths and hip offset was appreciated and excellent stability was noted both anteriorly and posteriorly. Trial components were removed. The acetabular shell was irrigated with copious amounts of normal saline with antibiotic solution and suctioned dry. A neutral Pinnacle Altrx polyethylene insert was positioned and impacted into place. Next, a 13.5 mm small stature AML femoral stem was positioned and impacted into place. Excellent scratch fit was appreciated. A trial reduction was again performed with a 32 mm hip ball with a +1 mm neck length. Again, good equalization of limb lengths was appreciated and excellent stability appreciated both anteriorly and posteriorly. The hip was then dislocated and the trial hip ball was removed. The Morse taper was cleaned and dried. A 32 mm cobalt chrome hip ball with a +1 mm neck length was placed on the trunnion and impacted into place. The hip was then reduced and placed through range of motion. Excellent stability was appreciated both anteriorly and posteriorly.  The wound was irrigated with copious amounts of normal saline with antibiotic solution and suctioned dry. Good hemostasis was appreciated. The posterior capsulotomy was repaired using #5 Ethibond. Piriformis tendon was reapproximated to the undersurface of the gluteus medius tendon using #5 Ethibond. Two medium drains were placed in the wound bed and brought out through separate stab incisions to be attached to a Hemovac reservoir. The IT band was reapproximated using interrupted sutures of #1 Vicryl. Subcutaneous tissue was approximated using first #0 Vicryl followed by #2-0 Vicryl. The skin was closed with skin staples.  The patient tolerated the procedure well and was transported to the recovery room in stable condition.   Marciano Sequin., M.D.

## 2015-11-25 NOTE — Anesthesia Procedure Notes (Signed)
Spinal Patient location during procedure: OR Staffing Anesthesiologist: Berdine Addison Performed by: anesthesiologist  Preanesthetic Checklist Completed: patient identified, site marked, surgical consent, pre-op evaluation, timeout performed, IV checked and risks and benefits discussed Spinal Block Patient position: sitting Prep: Betadine Patient monitoring: heart rate, cardiac monitor, continuous pulse ox and blood pressure Approach: midline Location: L3-4 Injection technique: single-shot Needle Needle type: Pencil-Tip  Needle gauge: 25 G Needle length: 9 cm Assessment Sensory level: T10 Additional Notes Marcaine 12.5 mg at 0721.

## 2015-11-25 NOTE — Progress Notes (Signed)
Pt said she cant take oxycodone because it makes her loopy.  She can tolerate codeine.  Dr Rosita Kea gave order to discontinue oxycodone and order norco

## 2015-11-25 NOTE — Transfer of Care (Signed)
Immediate Anesthesia Transfer of Care Note  Patient: Katrina Poole  Procedure(s) Performed: Procedure(s): TOTAL HIP ARTHROPLASTY (Left)  Patient Location: PACU  Anesthesia Type:Spinal  Level of Consciousness: awake, alert  and oriented  Airway & Oxygen Therapy: Patient Spontanous Breathing and Patient connected to nasal cannula oxygen  Post-op Assessment: Report given to RN and Post -op Vital signs reviewed and stable  Post vital signs: Reviewed and stable  Last Vitals:  Filed Vitals:   11/25/15 0605 11/25/15 1115  BP: 159/87 117/73  Pulse: 76   Temp: 36.7 C 36.4 C  Resp: 16 18    Complications: No apparent anesthesia complications

## 2015-11-26 LAB — CBC
HCT: 25.7 % — ABNORMAL LOW (ref 35.0–47.0)
Hemoglobin: 8.7 g/dL — ABNORMAL LOW (ref 12.0–16.0)
MCH: 30.8 pg (ref 26.0–34.0)
MCHC: 34 g/dL (ref 32.0–36.0)
MCV: 90.3 fL (ref 80.0–100.0)
PLATELETS: 212 10*3/uL (ref 150–440)
RBC: 2.84 MIL/uL — ABNORMAL LOW (ref 3.80–5.20)
RDW: 14.4 % (ref 11.5–14.5)
WBC: 8.2 10*3/uL (ref 3.6–11.0)

## 2015-11-26 LAB — BASIC METABOLIC PANEL
ANION GAP: 8 (ref 5–15)
BUN: 15 mg/dL (ref 6–20)
CALCIUM: 7.9 mg/dL — AB (ref 8.9–10.3)
CO2: 27 mmol/L (ref 22–32)
CREATININE: 0.71 mg/dL (ref 0.44–1.00)
Chloride: 100 mmol/L — ABNORMAL LOW (ref 101–111)
Glucose, Bld: 100 mg/dL — ABNORMAL HIGH (ref 65–99)
Potassium: 3.3 mmol/L — ABNORMAL LOW (ref 3.5–5.1)
SODIUM: 135 mmol/L (ref 135–145)

## 2015-11-26 MED ORDER — POTASSIUM CHLORIDE 20 MEQ PO PACK
40.0000 meq | PACK | Freq: Two times a day (BID) | ORAL | Status: AC
  Administered 2015-11-26 (×2): 40 meq via ORAL
  Filled 2015-11-26 (×2): qty 2

## 2015-11-26 MED ORDER — HYDROCODONE-ACETAMINOPHEN 5-325 MG PO TABS: 1.0000 | ORAL_TABLET | ORAL | Status: DC | PRN

## 2015-11-26 MED ORDER — ENOXAPARIN SODIUM 40 MG/0.4ML ~~LOC~~ SOLN: 40.0000 mg | SUBCUTANEOUS | Status: DC

## 2015-11-26 MED ORDER — TRAMADOL HCL 50 MG PO TABS: 50.0000 mg | ORAL_TABLET | ORAL | Status: DC | PRN

## 2015-11-26 NOTE — Progress Notes (Signed)
Physical Therapy Treatment Patient Details Name: Katrina Poole MRN: [id number] DOB: [birth year] Today's Date: 11/26/2015    History of Present Illness Pt underwent L THR posterior approach without reported post-op complications. POD#0 at time of PT evaluation    PT Comments    Pt progressing well. Requires little assist overall. Good compliance with posterior hip precautions with mild cueing. Pt participates well with bed exercises demonstrating fair range of motion and strength overall. Cautious ambulation with good reciprocal pattern. Pt received up in chair and encouraged to perform exercises throughout the day. Plan to see pt this afternoon for further ambulation and stair climbing to progress functional mobility.   Follow Up Recommendations  Home health PT     Equipment Recommendations  None recommended by PT    Recommendations for Other Services       Precautions / Restrictions Precautions Precautions: Posterior Hip Restrictions Weight Bearing Restrictions: Yes LLE Weight Bearing: Weight bearing as tolerated    Mobility  Bed Mobility Overal bed mobility: Needs Assistance Bed Mobility: Supine to Sit     Supine to sit: Min assist     General bed mobility comments: Min A for LLE and uses rail plus A to elevate trunk  Transfers Overall transfer level: Needs assistance Equipment used: Rolling walker (2 wheeled) Transfers: Sit to/from Stand Sit to Stand: Min guard         General transfer comment: No issues; performs well/safe. Good compliance with  hip precautions  Ambulation/Gait Ambulation/Gait assistance: Min guard Ambulation Distance (Feet): 20 Feet (2x to/from bathroom) Assistive device: Rolling walker (2 wheeled) Gait Pattern/deviations: Step-through pattern Gait velocity: Decreased Gait velocity interpretation: <1.8 ft/sec, indicative of risk for recurrent falls General Gait Details: slow, but fluid   Stairs            Wheelchair  Mobility    Modified Rankin (Stroke Patients Only)       Balance Overall balance assessment: Needs assistance         Standing balance support: Bilateral upper extremity supported Standing balance-Leahy Scale: Fair                      Cognition Arousal/Alertness: Awake/alert Behavior During Therapy: WFL for tasks assessed/performed Overall Cognitive Status: Within Functional Limits for tasks assessed       Memory: Decreased recall of precautions (Remembers 2 of 3)              Exercises Total Joint Exercises Ankle Circles/Pumps: AROM;Both;20 reps;Supine Quad Sets: Strengthening;Both;20 reps;Supine Gluteal Sets: Strengthening;Both;20 reps;Supine Towel Squeeze: Strengthening;Both;20 reps;Supine Short Arc Quad: AROM;Both;20 reps;Supine Heel Slides: AROM;AAROM;Left;20 reps;Supine (R AROM) Hip ABduction/ADduction: AAROM;Left;20 reps;Supine (R AROM) Straight Leg Raises: AAROM;Left;10 reps;Supine (R AROM) Other Exercises Other Exercises: set up for toileting    General Comments        Pertinent Vitals/Pain Pain Assessment: 0-10 Pain Score: 3  Pain Location: L hip Pain Intervention(s): Limited activity within patient's tolerance;Monitored during session;Repositioned    Home Living                      Prior Function            PT Goals (current goals can now be found in the care plan section) Progress towards PT goals: Progressing toward goals    Frequency  BID    PT Plan Current plan remains appropriate    Co-evaluation             End of Session  Equipment Utilized During Treatment: Gait belt Activity Tolerance: Patient tolerated treatment well;No increased pain Patient left: in chair;with call bell/phone within reach;with bed alarm set;with SCD's reapplied (ice applied)     Time: 1610-9604 PT Time Calculation (min) (ACUTE ONLY): 45 min  Charges:  $Gait Training: 8-22 mins $Therapeutic Exercise: 8-22 mins $Therapeutic  Activity: 8-22 mins                    G Codes:      Kristeen Miss, PTA 11/26/2015, 12:10 PM

## 2015-11-26 NOTE — Progress Notes (Signed)
   Subjective: 1 Day Post-Op Procedure(s) (LRB): TOTAL HIP ARTHROPLASTY (Left) Patient reports pain as 4 on 0-10 scale.   Patient is well, and has had no acute complaints or problems We will start therapy today.  Plan is to go Home after hospital stay. no nausea and no vomiting Patient denies any chest pains or shortness of breath. Objective: Vital signs in last 24 hours: Temp:  [95.3 F (35.2 C)-98.5 F (36.9 C)] 98.4 F (36.9 C) (02/21 0620) Pulse Rate:  [63-79] 79 (02/21 0620) Resp:  [12-18] 18 (02/21 0620) BP: (112-149)/(60-90) 112/66 mmHg (02/21 0620) SpO2:  [96 %-100 %] 99 % (02/21 0620) Weight:  [97.523 kg (215 lb)] 97.523 kg (215 lb) (02/20 1654) well approximated incision Heels are non tender and elevated off the bed using rolled towels Intake/Output from previous day: 02/20 0701 - 02/21 0700 In: 4343.3 [I.V.:4063.3; IV Piggyback:150] Out: 1905 [Urine:1325; Drains:330; Blood:250] Intake/Output this shift: Total I/O In: 1103.3 [I.V.:1103.3] Out: 920 [Urine:800; Drains:120]   Recent Labs  11/26/15 0432  HGB 8.7*    Recent Labs  11/26/15 0432  WBC 8.2  RBC 2.84*  HCT 25.7*  PLT 212    Recent Labs  11/26/15 0432  NA 135  K 3.3*  CL 100*  CO2 27  BUN 15  CREATININE 0.71  GLUCOSE 100*  CALCIUM 7.9*   No results for input(s): LABPT, INR in the last 72 hours.  EXAM General - Patient is Alert, Appropriate and Oriented Extremity - Neurologically intact Neurovascular intact Sensation intact distally Intact pulses distally Dorsiflexion/Plantar flexion intact Dressing - dressing C/D/I Motor Function - intact, moving foot and toes well on exam.    Past Medical History  Diagnosis Date  . Hypertension     controlled on meds  . Asthma     humidity and exercise during summer  . Arthritis     osteo of left hip  . GERD (gastroesophageal reflux disease)   . Vertigo   . Wears dentures     full dentures on top, partial bottom  . Meniere syndrome      hearing deaf right ear  . Neuromuscular disorder (HCC)     finger tips go numb, neck vertebrae    Assessment/Plan: 1 Day Post-Op Procedure(s) (LRB): TOTAL HIP ARTHROPLASTY (Left) Active Problems:   S/P total hip arthroplasty  Estimated body mass index is 41.99 kg/(m^2) as calculated from the following:   Height as of this encounter: 5' (1.524 m).   Weight as of this encounter: 97.523 kg (215 lb). Advance diet Up with therapy D/C IV fluids Plan for discharge tomorrow Discharge home with home health  Labs: reviewed DVT Prophylaxis - Lovenox, Foot Pumps and TED hose Weight-Bearing as tolerated to right leg D/C O2 and Pulse OX and try on Room Air Labs in am Begin working on a bowel movement  Kelan Pritt R. [facility] PA [facility] 11/26/2015, 6:57 AM

## 2015-11-26 NOTE — Evaluation (Signed)
Occupational Therapy Evaluation Patient Details Name: Katrina Poole MRN: [id number] DOB: [birth year] Today's Date: 11/26/2015    History of Present Illness This patient is a 70 year old female who came to [facility] for a L THR (posterior).   Clinical Impression   This patient is a 70 year old female who came to [facility] for a L total hip replacement (posterior approach).  Patient lives with her husband in a one story home with 6 steps to enter and a rail on the left ascending.  She had been independent with ADL and functional mobility.  She now has deficits with pain, mobility, and activities of daily living and would benefit from Occupational Therapy for ADL/functional mobility training while .staying within hip precautions.      Follow Up Recommendations       Equipment Recommendations       Recommendations for Other Services       Precautions / Restrictions Precautions Precautions: Posterior Hip Restrictions Weight Bearing Restrictions: Yes LLE Weight Bearing: Weight bearing as tolerated      Mobility Bed Mobility  Transfers            Balance Overall balance assessment: Needs assistance                                      ADL                                         General ADL Comments: Had been independent with assistive device and works some.  Today practiced techniques for lower body dressing using hip kit. Donned/doffed socks and pants to knees (drain still in place). She needed minimal assist and verbal cues and physical cues for technique, safety, and to stay within hip precautions (posterior approach).      Vision     Perception     Praxis      Pertinent Vitals/Pain Pain Assessment: 0-10 Pain Score: 4  Pain Location: L hip Pain Intervention(s): RN gave pain meds during session     Hand Dominance Right   Extremity/Trunk Assessment Upper Extremity Assessment Upper Extremity  Assessment: Overall WFL for tasks assessed   Lower Extremity Assessment Lower Extremity Assessment: Defer to PT evaluation       Communication Communication Communication: No difficulties   Cognition Arousal/Alertness: Awake/alert Behavior During Therapy: WFL for tasks assessed/performed Overall Cognitive Status: Within Functional Limits for tasks assessed       Memory: Decreased recall of precautions (can name them, but needs cues to stay with in hip precautions. (posterior).)             General Comments       Exercises Exercises: Total Joint;Other exercises Other Exercises Other Exercises: set up for toileting   Shoulder Instructions      Home Living Family/patient expects to be discharged to:: Private residence Living Arrangements: Spouse/significant other Available Help at Discharge: Family Type of Home: House Home Access: Stairs to enter Technical brewer of Steps: 6 Entrance Stairs-Rails: Left Home Layout: One level     Bathroom Shower/Tub: Occupational psychologist: Standard Bathroom Accessibility: Yes   Home Equipment: Prentiss - single point;Walker - 2 wheels;Shower seat          Prior Functioning/Environment Level of Independence: Independent with assistive  device(s)        Comments: works    OT Diagnosis: Acute pain   OT Problem List: Decreased range of motion;Decreased activity tolerance;Impaired balance (sitting and/or standing);Decreased safety awareness;Decreased coordination;Decreased knowledge of use of DME or AE;Decreased knowledge of precautions;Pain   OT Treatment/Interventions: Self-care/ADL training    OT Goals(Current goals can be found in the care plan section) Acute Rehab OT Goals Patient Stated Goal: to go home OT Goal Formulation: With patient Time For Goal Achievement: 12/10/15 Potential to Achieve Goals: Good  OT Frequency: Min 1X/week   Barriers to D/C:            Co-evaluation               End of Session Equipment Utilized During Treatment:  (hip kit)  Activity Tolerance:   Patient left: in chair;with call bell/phone within reach;with chair alarm set   Time: 2956-2130 OT Time Calculation (min): 23 min Charges:  OT General Charges $OT Visit: 1 Procedure OT Evaluation $OT Eval Low Complexity: 1 Procedure OT Treatments $Self Care/Home Management : 8-22 mins G-Codes:    Myrene Galas, MS/OTR/L  11/26/2015, 1:19 PM

## 2015-11-26 NOTE — Discharge Instructions (Signed)

## 2015-11-26 NOTE — Anesthesia Postprocedure Evaluation (Signed)
Anesthesia Post Note  Patient: Katrina Poole  Procedure(s) Performed: Procedure(s) (LRB): TOTAL HIP ARTHROPLASTY (Left)  Patient location during evaluation: Nursing Unit Anesthesia Type: Spinal Level of consciousness: awake and alert and oriented Pain management: satisfactory to patient Vital Signs Assessment: post-procedure vital signs reviewed and stable Respiratory status: respiratory function stable Cardiovascular status: stable Anesthetic complications: no    Last Vitals:  Filed Vitals:   11/25/15 2133 11/26/15 0620  BP: 126/62 112/66  Pulse: 72 79  Temp: 36.9 C 36.9 C  Resp: 18 18    Last Pain:  Filed Vitals:   11/26/15 0621  PainSc: 2                  Clydene Pugh

## 2015-11-26 NOTE — Progress Notes (Signed)
Physical Therapy Treatment Patient Details Name: Katrina Poole MRN: [id number] DOB: May 19, 1946 Today's Date: 11/26/2015    History of Present Illness This patient is a 70 year old female who came to [facility] for a L THR (posterior).    PT Comments    Pt continue to have pain managed well. Pt fatigued, but agreeable to PT. Pt progressing with ambulation well; ambulates 270 ft. Performs up/down 6 steps. Pt requires some cueing for ambulation for improved range of motion on left with swing phase. Pt returned to bed to rest. Continue PT for progression of strength, ambulation and all functional mobility to allow for an optimal, safe return home.   Follow Up Recommendations  Home health PT     Equipment Recommendations  None recommended by PT    Recommendations for Other Services       Precautions / Restrictions Precautions Precautions: Posterior Hip Restrictions Weight Bearing Restrictions: Yes LLE Weight Bearing: Weight bearing as tolerated    Mobility  Bed Mobility Overal bed mobility: Needs Assistance Bed Mobility: Supine to Sit;Sit to Supine     Supine to sit: Min assist;HOB elevated Sit to supine: Min assist   General bed mobility comments: Use of trapeze to bridge hips to edge of bed. Min a for BLE and trunk to sit; BLEs to supine  Transfers Overall transfer level: Needs assistance Equipment used: Rolling walker (2 wheeled) Transfers: Sit to/from Stand Sit to Stand: Min guard         General transfer comment: Performs well  Ambulation/Gait Ambulation/Gait assistance: Min guard Ambulation Distance (Feet): 270 Feet Assistive device: Rolling walker (2 wheeled) Gait Pattern/deviations: Step-through pattern (decreased L knee hip/knee flexion with swing phase)   Gait velocity interpretation: Below normal speed for age/gender (2.5 ft/sec) General Gait Details: Improved hip/knee flexion on L with cueing during swing phase   Stairs Stairs: Yes Stairs  assistance: Min guard Stair Management: One rail Left;Step to pattern;Sideways (BUEs on 1 rail; same set up at home) Number of Stairs: 6 General stair comments: Pt has been performing steps this way for a while preoperatively; no issues.   Wheelchair Mobility    Modified Rankin (Stroke Patients Only)       Balance Overall balance assessment: Needs assistance         Standing balance support: Bilateral upper extremity supported Standing balance-Leahy Scale: Fair                      Cognition Arousal/Alertness: Awake/alert Behavior During Therapy: WFL for tasks assessed/performed Overall Cognitive Status: Within Functional Limits for tasks assessed       Memory: Decreased recall of precautions (can name them, but needs cues to stay with in hip precautions. (posterior).)              Exercises      General Comments        Pertinent Vitals/Pain Pain Assessment: 0-10 Pain Score: 4  Pain Location: L hip Pain Intervention(s): Monitored during session    Home Living Family/patient expects to be discharged to:: Private residence Living Arrangements: Spouse/significant other Available Help at Discharge: Family Type of Home: House Home Access: Stairs to enter Entrance Stairs-Rails: Left Home Layout: One level Home Equipment: Cane - single point;Walker - 2 wheels;Shower seat      Prior Function Level of Independence: Independent with assistive device(s)      Comments: works   PT Goals (current goals can now be found in the care plan section) Acute  Rehab PT Goals Patient Stated Goal: to go home Progress towards PT goals: Progressing toward goals    Frequency  BID    PT Plan Current plan remains appropriate    Co-evaluation             End of Session Equipment Utilized During Treatment: Gait belt Activity Tolerance: Patient tolerated treatment well;No increased pain Patient left: in bed;with call bell/phone within reach;with bed alarm  set;with family/visitor present;with SCD's reapplied (ice applied)     Time: 1610-9604 PT Time Calculation (min) (ACUTE ONLY): 26 min  Charges:  $Gait Training: 23-37 mins                    G Codes:      Kristeen Miss, PTA 11/26/2015, 2:25 PM

## 2015-11-26 NOTE — Progress Notes (Signed)
Clinical Social Worker (CSW) received SNF consult. PT is recommending home health. RN Case Manager is aware of above. Please reconsult if future social work needs arise. CSW signing off.   Yasseen Salls Morgan, LCSW [phone] 

## 2015-11-27 LAB — BASIC METABOLIC PANEL
Anion gap: 4 — ABNORMAL LOW (ref 5–15)
BUN: 18 mg/dL (ref 6–20)
CALCIUM: 8.3 mg/dL — AB (ref 8.9–10.3)
CO2: 27 mmol/L (ref 22–32)
CREATININE: 0.76 mg/dL (ref 0.44–1.00)
Chloride: 99 mmol/L — ABNORMAL LOW (ref 101–111)
GFR calc Af Amer: >60 mL/min (ref >60)
GFR calc non Af Amer: >60 mL/min (ref >60)
GLUCOSE: 99 mg/dL (ref 65–99)
Potassium: 4.5 mmol/L (ref 3.5–5.1)
Sodium: 130 mmol/L — ABNORMAL LOW (ref 135–145)

## 2015-11-27 LAB — CBC
HEMATOCRIT: 27 % — AB (ref 35.0–47.0)
Hemoglobin: 9.1 g/dL — ABNORMAL LOW (ref 12.0–16.0)
MCH: 30.4 pg (ref 26.0–34.0)
MCHC: 33.5 g/dL (ref 32.0–36.0)
MCV: 90.5 fL (ref 80.0–100.0)
Platelets: 241 10*3/uL (ref 150–440)
RBC: 2.99 MIL/uL — ABNORMAL LOW (ref 3.80–5.20)
RDW: 14.4 % (ref 11.5–14.5)
WBC: 9.3 10*3/uL (ref 3.6–11.0)

## 2015-11-27 LAB — SURGICAL PATHOLOGY

## 2015-11-27 NOTE — Progress Notes (Signed)
Called dr Ernest Pine about pt blood pressure of 90/50 order received to hold bp med because pt took amlodipine at 0600 am today

## 2015-11-27 NOTE — Progress Notes (Signed)
Physical Therapy Treatment Patient Details Name: Katrina Poole MRN: [id number] DOB: April 01, 1946 Today's Date: 11/27/2015    History of Present Illness This patient is a 70 year old female who came to [facility] for a L THR (posterior).    PT Comments    Pt continues to progress towards functional goals. She ambulated ~250 with FWW and supervision and performed transfers with mod I with FWW. Pt is able to state 2/3 posterior hip precautions. Stair training completes with 6 steps and L rail (ascending) with min guard. No c/o pain, SOB or dizziness during session.    Follow Up Recommendations  Home health PT     Equipment Recommendations  None recommended by PT    Recommendations for Other Services       Precautions / Restrictions Precautions Precautions: Posterior Hip Restrictions LLE Weight Bearing: Weight bearing as tolerated    Mobility  Bed Mobility               General bed mobility comments: Pt up in chair, NT.  Transfers Overall transfer level: Modified independent Equipment used: Rolling walker (2 wheeled) Transfers: Sit to/from [employer] Sit to Stand: Modified independent (Device/Increase time) Stand pivot transfers: Modified independent (Device/Increase time)       General transfer comment: steady, good hand placement  Ambulation/Gait Ambulation/Gait assistance: Supervision Ambulation Distance (Feet): 250 Feet Assistive device: Rolling walker (2 wheeled) Gait Pattern/deviations: Wide base of support;Decreased stride length     General Gait Details: pt steady, no LOB, slightly impulsive with turns   Stairs Stairs: Yes Stairs assistance: Min guard Stair Management: One rail Left Number of Stairs: 6 General stair comments: pt steady, no difficulties  Wheelchair Mobility    Modified Rankin (Stroke Patients Only)       Balance Overall balance assessment: Modified Independent Sitting-balance support: No upper extremity  supported Sitting balance-Leahy Scale: Good     Standing balance support: Single extremity supported Standing balance-Leahy Scale: Good Standing balance comment: no LOB                    Cognition Arousal/Alertness: Awake/alert Behavior During Therapy: WFL for tasks assessed/performed Overall Cognitive Status: Within Functional Limits for tasks assessed       Memory: Decreased recall of precautions (states 2/3)              Exercises Other Exercises Other Exercises: B LE seated therex: LAQ and hip abd with manual resistance, hip add squeezes, ankle pumps; standing hip flexion (within precautions) x10 each. VC for technique.    General Comments        Pertinent Vitals/Pain Pain Assessment: No/denies pain    Home Living                      Prior Function            PT Goals (current goals can now be found in the care plan section) Acute Rehab PT Goals Patient Stated Goal: to go home PT Goal Formulation: With patient/family Time For Goal Achievement: 12/09/15 Potential to Achieve Goals: Good Progress towards PT goals: Progressing toward goals    Frequency  BID    PT Plan Current plan remains appropriate    Co-evaluation             End of Session Equipment Utilized During Treatment: Gait belt Activity Tolerance: Patient tolerated treatment well;No increased pain Patient left: in chair;with call bell/phone within reach;with chair alarm set  Time: 1610-9604 PT Time Calculation (min) (ACUTE ONLY): 25 min  Charges:  $Gait Training: 8-22 mins $Therapeutic Exercise: 8-22 mins                    G Codes:      Adelene Idler, PT, DPT  11/27/2015, 10:00 AM [phone]

## 2015-11-27 NOTE — Discharge Summary (Signed)
Physician Discharge Summary  Patient ID: Katrina Poole MRN: [id number] DOB/AGE: 68/08/1946 68 y.o.  Admit date: 11/25/2015 Discharge date: 11/27/2015  Admission Diagnoses:  chronic left hip arthroplasty   Discharge Diagnoses: Patient Active Problem List   Diagnosis Date Noted  . 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 Hypertension     controlled on meds  . Asthma     humidity and exercise during summer  . Arthritis     osteo of left hip  . GERD (gastroesophageal reflux disease)   . Vertigo   . Wears dentures     full dentures on top, partial bottom  . Meniere syndrome     hearing deaf right ear  . Neuromuscular disorder (HCC)     finger tips go numb, neck vertebrae     Transfusion: No transfusions given doing this admission   Consultants (if any):   case management for home health assistance  Discharged Condition: Improved  Hospital Course: Katrina Poole is an 70 y.o. female who was admitted 11/25/2015 with a diagnosis of degenerative arthrosis left hip and went to the operating room on 11/25/2015 and underwent the above named procedures.    Surgeries:Procedure(s): TOTAL HIP ARTHROPLASTY on 11/25/2015  PRE-OPERATIVE DIAGNOSIS: Degenerative arthrosis of the left hip, primary  POST-OPERATIVE DIAGNOSIS: Same  PROCEDURE: Left total hip arthroplasty  SURGEON: Jena Gauss. M.D.  ASSISTANT: Van Clines, PA (present and scrubbed throughout the case, critical for assistance with exposure, retraction, instrumentation, and closure)  ANESTHESIA: spinal  ESTIMATED BLOOD LOSS: 250 mL  FLUIDS REPLACED: 2300 mL of crystalloid  DRAINS: 2 medium drains to a Hemovac reservoir  IMPLANTS UTILIZED: DePuy 13.5 mm small stature AML femoral stem, 48 mm OD Pinnacle 100 acetabular component, neutral Pinnacle Altrx polyethylene insert, and a 32 mm CoCr +1 mm hip ball  INDICATIONS FOR SURGERY: Katrina Poole is a 70 y.o.  year old female with a long history of progressive hip and groin pain. X-rays demonstrated severe degenerative changes. The patient had not seen any significant improvement despite conservative nonsurgical intervention. After discussion of the risks and benefits of surgical intervention, the patient expressed understanding of the risks benefits and agree with plans for total hip arthroplasty.   The risks, benefits, and alternatives were discussed at length including but not limited to the risks of infection, bleeding, nerve injury, stiffness, blood clots, the need for revision surgery, limb length inequality, dislocation, cardiopulmonary complications, among others, and they were willing to proceed. Patient tolerated the surgery well. No complications .Patient was taken to PACU where she was stabilized and then transferred to the orthopedic floor.  Patient started on Lovenox 30 q 12 hrs. Foot pumps applied bilaterally at 80 mm hgb. Heels elevated off bed with rolled towels. No evidence of DVT. Calves non tender. Negative Homan. Physical therapy started on day #1 for gait training and transfer with OT starting on  day #1 for ADL and assisted devices. Patient has done well with therapy. Ambulated greater than 270 feet upon being discharged. Was able to go up and down 4 steps independently and safely.  Patient's IV and Foley were discontinued on day #1 with the Hemovac being discharged on day #2. Dressing SO was changed on day #2.     She was given perioperative antibiotics:  Anti-infectives    Start     Dose/Rate Route Frequency Ordered Stop   11/25/15 1400  ceFAZolin (ANCEF) IVPB 2 g/50 mL  premix     2 g 100 mL/hr over 30 Minutes Intravenous Every 6 hours 11/25/15 1254 11/26/15 0757   11/25/15 0553  ceFAZolin (ANCEF) 2-3 GM-% IVPB SOLR    Comments:  LEWIS, CINDY: cabinet override      11/25/15 0553 11/25/15 1759   11/25/15 0200  ceFAZolin (ANCEF) IVPB 2 g/50 mL premix     2 g 100 mL/hr over 30  Minutes Intravenous  Once 11/25/15 0152 11/25/15 0741    .  She was fitted with AV 1 compression foot pump devices bilaterally, instructed on heel pumps, early ambulation, and fitted with TED stockings bilaterally for DVT prophylaxis.  She benefited maximally from the hospital stay and there were no complications.    Recent vital signs:  Filed Vitals:   11/26/15 1942 11/27/15 0515  BP: 108/57 125/60  Pulse: 68 84  Temp: 98.6 F (37 C) 98.3 F (36.8 C)  Resp: 18 18    Recent laboratory studies:  Lab Results  Component Value Date   HGB 9.1* 11/27/2015   HGB 8.7* 11/26/2015   Lab Results  Component Value Date   WBC 9.3 11/27/2015   PLT 241 11/27/2015   Lab Results  Component Value Date   INR 1.13 11/13/2015   Lab Results  Component Value Date   NA 130* 11/27/2015   K 4.5 11/27/2015   CL 99* 11/27/2015   CO2 27 11/27/2015   BUN 18 11/27/2015   CREATININE 0.76 11/27/2015   GLUCOSE 99 11/27/2015    Discharge Medications:     Medication List    TAKE these medications        acetaminophen-codeine 300-30 MG tablet  Commonly known as:  TYLENOL #3  Take 1 tablet by mouth every 6 (six) hours as needed for moderate pain.     amLODipine 5 MG tablet  Commonly known as:  NORVASC  Take 5 mg by mouth every morning. am     celecoxib 200 MG capsule  Commonly known as:  CELEBREX  Take 200 mg by mouth every morning.     clonazePAM 0.5 MG tablet  Commonly known as:  KLONOPIN  Take 0.5 mg by mouth at bedtime.     enoxaparin 40 MG/0.4ML injection  Commonly known as:  LOVENOX  Inject 0.4 mLs (40 mg total) into the skin daily.     fluticasone 44 MCG/ACT inhaler  Commonly known as:  FLOVENT HFA  Inhale 1 puff into the lungs as needed. Reported on 11/25/2015     fosinopril 40 MG tablet  Commonly known as:  MONOPRIL  Take 40 mg by mouth every morning. am     gabapentin 300 MG capsule  Commonly known as:  NEURONTIN  Take 600 mg by mouth at bedtime.      hydrochlorothiazide 25 MG tablet  Commonly known as:  HYDRODIURIL  Take 25 mg by mouth every morning. am     HYDROcodone-acetaminophen 5-325 MG tablet  Commonly known as:  NORCO/VICODIN  Take 1-2 tablets by mouth every 4 (four) hours as needed for moderate pain.     meloxicam 7.5 MG tablet  Commonly known as:  MOBIC  Take 7.5 mg by mouth 2 (two) times daily. Am and pm     metoprolol 50 MG tablet  Commonly known as:  LOPRESSOR  Take 50 mg by mouth every morning. am     mometasone 50 MCG/ACT nasal spray  Commonly known as:  NASONEX  Place 2 sprays into the nose daily.  multivitamin tablet  Take 1 tablet by mouth every morning. am     ranitidine 150 MG tablet  Commonly known as:  ZANTAC  Take 150 mg by mouth every morning. Reported on 11/25/2015     STOOL SOFTENER LAXATIVE PO  Take 1 capsule by mouth as needed (1-2 capsules as needed for constipation). am     traMADol 50 MG tablet  Commonly known as:  ULTRAM  Take 1-2 tablets (50-100 mg total) by mouth every 4 (four) hours as needed for moderate pain.     zolpidem 5 MG tablet  Commonly known as:  AMBIEN  Take 5 mg by mouth at bedtime.        Diagnostic Studies: Dg Hip Port Unilat With Pelvis 1v Left  11/25/2015  CLINICAL DATA:  Post left hip replacement. EXAM: DG HIP (WITH OR WITHOUT PELVIS) 1V PORT LEFT COMPARISON:  None. FINDINGS: Changes of left hip replacement. Normal alignment. No visible hardware or bony complicating feature. Soft tissue drain in place laterally. IMPRESSION: Left hip replacement.  No complicating feature. Electronically Signed   By: Charlett Nose M.D.   On: 11/25/2015 12:07    Disposition: 01-Home or Self Care      Discharge Instructions    Diet - low sodium heart healthy    Complete by:  As directed      Diet - low sodium heart healthy    Complete by:  As directed      Increase activity slowly    Complete by:  As directed      Increase activity slowly    Complete by:  As directed             Follow-up Information    Follow up with Donato Heinz, MD On 01/07/2016.   Specialty:  Orthopedic Surgery   Why:  at 11:15am   Contact information:   [address] [facility] [place] [address] [phone]        Signed: Tera Partridge 11/27/2015, 6:53 AM

## 2015-11-27 NOTE — Care Management Important Message (Signed)
Important Message  Patient Details  Name: Katrina Poole MRN: [id number] Date of Birth: 12/10/1945   Medicare Important Message Given:  Yes    Mischa Brittingham A, RN 11/27/2015, 8:26 AM

## 2015-11-27 NOTE — Progress Notes (Signed)
   Subjective: 2 Days Post-Op Procedure(s) (LRB): TOTAL HIP ARTHROPLASTY (Left) Patient reports pain as 3 on 0-10 scale.   Patient is well, and has had no acute complaints or problems Continue with physical therapy today.  Plan is to go Home after hospital stay. no nausea and no vomiting Patient denies any chest pains or shortness of breath. Objective: Vital signs in last 24 hours: Temp:  [97.7 F (36.5 C)-98.6 F (37 C)] 98.3 F (36.8 C) (02/22 0515) Pulse Rate:  [68-84] 84 (02/22 0515) Resp:  [18] 18 (02/22 0515) BP: (99-136)/(50-63) 125/60 mmHg (02/22 0515) SpO2:  [95 %-100 %] 97 % (02/22 0515) well approximated incision Heels are non tender and elevated off the bed using rolled towels Intake/Output from previous day: 02/21 0701 - 02/22 0700 In: 480 [P.O.:480] Out: 470 [Urine:350; Drains:120] Intake/Output this shift: Total I/O In: -  Out: 470 [Urine:350; Drains:120]   Recent Labs  11/26/15 0432 11/27/15 0534  HGB 8.7* 9.1*    Recent Labs  11/26/15 0432 11/27/15 0534  WBC 8.2 9.3  RBC 2.84* 2.99*  HCT 25.7* 27.0*  PLT 212 241    Recent Labs  11/26/15 0432 11/27/15 0534  NA 135 130*  K 3.3* 4.5  CL 100* 99*  CO2 27 27  BUN 15 18  CREATININE 0.71 0.76  GLUCOSE 100* 99  CALCIUM 7.9* 8.3*   No results for input(s): LABPT, INR in the last 72 hours.  EXAM General - Patient is Alert, Appropriate and Oriented Extremity - Neurologically intact Neurovascular intact Sensation intact distally Intact pulses distally Dorsiflexion/Plantar flexion intact Dressing - scant drainage Motor Function - intact, moving foot and toes well on exam.    Past Medical History  Diagnosis Date  . Hypertension     controlled on meds  . Asthma     humidity and exercise during summer  . Arthritis     osteo of left hip  . GERD (gastroesophageal reflux disease)   . Vertigo   . Wears dentures     full dentures on top, partial bottom  . Meniere syndrome     hearing  deaf right ear  . Neuromuscular disorder (HCC)     finger tips go numb, neck vertebrae    Assessment/Plan: 2 Days Post-Op Procedure(s) (LRB): TOTAL HIP ARTHROPLASTY (Left) Active Problems:   S/P total hip arthroplasty  Estimated body mass index is 41.99 kg/(m^2) as calculated from the following:   Height as of this encounter: 5' (1.524 m).   Weight as of this encounter: 97.523 kg (215 lb). Up with therapy Discharge home with home health today after patient does physical therapy.  Labs: Were reviewed DVT Prophylaxis - Lovenox, Foot Pumps and TED hose Weight-Bearing as tolerated to left leg D/C O2 and Pulse OX and try on Room Air Please change dressing prior to discharge. Also give the patient 2 extreme honeycomb dressings to take home. Possibly be sure to make a copy of the discharge instructions and give the patient  Lynnda Shields. [facility] [place] [facility] 11/27/2015, 6:49 AM

## 2015-11-27 NOTE — Progress Notes (Signed)
Occupational Therapy Treatment Patient Details Name: TRINITA DEVLIN MRN: [id number] DOB: 1946/10/02 Today's Date: 11/27/2015    History of present illness This patient is a 70 year old female who came to [facility] for a L THR (posterior).   OT comments  Patient fully dressed when entering the room and ready to go home. Practiced techniques for lower body dressing using hip kit, including donning and doffing socks and donning pants. Completed this with one verbal cue to stay below 70o of hip flexion and using hip kit. Patient has written list of vendors who have hip kit.  Follow Up Recommendations   (Home with home health Physical Therapy only, no further Occupational Therapy needed.)    Equipment Recommendations       Recommendations for Other Services      Precautions / Restrictions Precautions Precautions: Posterior Hip Precaution Booklet Issued: Yes (comment) (per PT) Restrictions LLE Weight Bearing: Weight bearing as tolerated       Mobility Bed Mobility               General bed mobility comments: Pt up in chair, NT.  Transfers    Balance                   ADL                                         General ADL Comments: . Patient fully dressed when entering the room and ready to go home. Practiced techniques for lower body dressing using hip kit, including donning and doffing socks and donning pants. Completed this with one verbal cue to stay below 70o of hip flexion and using hip kit. Patient has written list of vendors who have hip kit.      Vision                     Perception     Praxis      Cognition   Behavior During Therapy: WFL for tasks assessed/performed Overall Cognitive Status: Within Functional Limits for tasks assessed       Memory:  (named 3 of 3 precautions.)               Extremity/Trunk Assessment               Exercises   Shoulder Instructions       General Comments       Pertinent Vitals/ Pain       Pain Assessment: No/denies pain  Home Living                                          Prior Functioning/Environment              Frequency       Progress Toward Goals  OT Goals(current goals can now be found in the care plan section)     Acute Rehab OT Goals Patient Stated Goal: to go home  Plan      Co-evaluation                 End of Session Equipment Utilized During Treatment:  (hip kit)   Activity Tolerance     Patient Left in chair;with call bell/phone within reach;with chair alarm set  Nurse Communication          Time: 1025-8527 OT Time Calculation (min): 12 min  Charges: OT General Charges $OT Visit: 1 Procedure OT Treatments $Self Care/Home Management : 8-22 mins Sharon Mt, MS/OTR/L  Sharon Mt 11/27/2015, 10:20 AM

## 2016-06-03 ENCOUNTER — Encounter: Payer: Self-pay | Admitting: *Deleted

## 2016-06-04 ENCOUNTER — Ambulatory Visit: Payer: Medicare Other | Admitting: Anesthesiology

## 2016-06-04 ENCOUNTER — Encounter: Payer: Self-pay | Admitting: Anesthesiology

## 2016-06-04 ENCOUNTER — Ambulatory Visit
Admission: RE | Admit: 2016-06-04 | Discharge: 2016-06-04 | Disposition: A | Discharge status: Home / Self Care | Payer: Medicare Other | Source: Ambulatory Visit | Attending: Gastroenterology | Admitting: Gastroenterology

## 2016-06-04 ENCOUNTER — Encounter
Admission: RE | Disposition: A | Discharge status: Home / Self Care | Payer: Self-pay | Source: Ambulatory Visit | Attending: Gastroenterology

## 2016-06-04 DIAGNOSIS — K5520 Angiodysplasia of colon without hemorrhage: Secondary | ICD-10-CM | POA: Diagnosis not present

## 2016-06-04 DIAGNOSIS — M1612 Unilateral primary osteoarthritis, left hip: Secondary | ICD-10-CM | POA: Diagnosis not present

## 2016-06-04 DIAGNOSIS — K449 Diaphragmatic hernia without obstruction or gangrene: Secondary | ICD-10-CM | POA: Insufficient documentation

## 2016-06-04 DIAGNOSIS — I10 Essential (primary) hypertension: Secondary | ICD-10-CM | POA: Diagnosis not present

## 2016-06-04 DIAGNOSIS — K219 Gastro-esophageal reflux disease without esophagitis: Secondary | ICD-10-CM | POA: Insufficient documentation

## 2016-06-04 DIAGNOSIS — K621 Rectal polyp: Secondary | ICD-10-CM | POA: Diagnosis not present

## 2016-06-04 DIAGNOSIS — K5730 Diverticulosis of large intestine without perforation or abscess without bleeding: Secondary | ICD-10-CM | POA: Diagnosis not present

## 2016-06-04 HISTORY — PX: COLONOSCOPY WITH PROPOFOL: SHX5780

## 2016-06-04 HISTORY — PX: ESOPHAGOGASTRODUODENOSCOPY (EGD) WITH PROPOFOL: SHX5813

## 2016-06-04 SURGERY — ESOPHAGOGASTRODUODENOSCOPY (EGD) WITH PROPOFOL
Anesthesia: General

## 2016-06-04 MED ORDER — PROPOFOL 10 MG/ML IV BOLUS
INTRAVENOUS | Status: DC | PRN
  Administered 2016-06-04: 40 mg via INTRAVENOUS
  Administered 2016-06-04: 50 mg via INTRAVENOUS
  Administered 2016-06-04: 40 mg via INTRAVENOUS
  Administered 2016-06-04: 30 mg via INTRAVENOUS
  Administered 2016-06-04: 20 mg via INTRAVENOUS

## 2016-06-04 MED ORDER — PHENYLEPHRINE HCL 10 MG/ML IJ SOLN
INTRAMUSCULAR | Status: DC | PRN
  Administered 2016-06-04 (×3): 100 ug via INTRAVENOUS

## 2016-06-04 MED ORDER — EPHEDRINE SULFATE 50 MG/ML IJ SOLN
INTRAMUSCULAR | Status: DC | PRN
  Administered 2016-06-04: 10 mg via INTRAVENOUS

## 2016-06-04 MED ORDER — PROPOFOL 500 MG/50ML IV EMUL
INTRAVENOUS | Status: DC | PRN
  Administered 2016-06-04: 180 ug/kg/min via INTRAVENOUS

## 2016-06-04 MED ORDER — SODIUM CHLORIDE 0.9 % IV SOLN: INTRAVENOUS | Status: DC

## 2016-06-04 MED ORDER — MIDAZOLAM HCL 2 MG/2ML IJ SOLN
INTRAMUSCULAR | Status: DC | PRN
  Administered 2016-06-04: 1 mg via INTRAVENOUS

## 2016-06-04 MED ORDER — CLINDAMYCIN PHOSPHATE 300 MG/2ML IJ SOLN: 600.0000 mg | Freq: Once | INTRAMUSCULAR | Status: DC

## 2016-06-04 MED ORDER — SODIUM CHLORIDE 0.9 % IV SOLN
INTRAVENOUS | Status: DC
  Administered 2016-06-04: 09:00:00 via INTRAVENOUS

## 2016-06-04 MED ORDER — FENTANYL CITRATE (PF) 100 MCG/2ML IJ SOLN
INTRAMUSCULAR | Status: DC | PRN
  Administered 2016-06-04: 50 ug via INTRAVENOUS

## 2016-06-04 MED ORDER — CLINDAMYCIN PHOSPHATE 600 MG/50ML IV SOLN
600.0000 mg | Freq: Once | INTRAVENOUS | Status: AC
  Administered 2016-06-04 (×2): 600 mg via INTRAVENOUS

## 2016-06-04 MED ORDER — LIDOCAINE HCL (PF) 2 % IJ SOLN
INTRAMUSCULAR | Status: DC | PRN
  Administered 2016-06-04: 100 mg via INTRADERMAL

## 2016-06-04 NOTE — Transfer of Care (Signed)
Immediate Anesthesia Transfer of Care Note  Patient: Katrina Poole  Procedure(s) Performed: Procedure(s): ESOPHAGOGASTRODUODENOSCOPY (EGD) WITH PROPOFOL (N/A) COLONOSCOPY WITH PROPOFOL (N/A)  Patient Location: PACU  Anesthesia Type:General  Level of Consciousness: awake  Airway & Oxygen Therapy: Patient Spontanous Breathing and Patient connected to nasal cannula oxygen  Post-op Assessment: Report given to RN and Post -op Vital signs reviewed and stable  Post vital signs: Reviewed and stable  Last Vitals:  Vitals:   06/04/16 0833 06/04/16 1115  BP: 138/71 (!) 0/0  Pulse: 71 76  Resp: 17 16  Temp: 36.5 C 36.2 C    Last Pain:  Vitals:   06/04/16 1115  TempSrc: Tympanic         Complications: No apparent anesthesia complications

## 2016-06-04 NOTE — Anesthesia Procedure Notes (Signed)
Date/Time: 06/04/2016 10:05 AM Performed by: Henrietta HooverPOPE, Jermie Hippe Pre-anesthesia Checklist: Patient identified, Emergency Drugs available, Suction available, Patient being monitored and Timeout performed Patient Re-evaluated:Patient Re-evaluated prior to inductionOxygen Delivery Method: Nasal cannula Intubation Type: IV induction Placement Confirmation: positive ETCO2

## 2016-06-04 NOTE — Anesthesia Postprocedure Evaluation (Signed)
Anesthesia Post Note  Patient: Katrina Poole  Procedure(s) Performed: Procedure(s) (LRB): ESOPHAGOGASTRODUODENOSCOPY (EGD) WITH PROPOFOL (N/A) COLONOSCOPY WITH PROPOFOL (N/A)  Patient location during evaluation: Endoscopy Anesthesia Type: General Level of consciousness: awake and alert Pain management: pain level controlled Vital Signs Assessment: post-procedure vital signs reviewed and stable Respiratory status: spontaneous breathing, nonlabored ventilation, respiratory function stable and patient connected to nasal cannula oxygen Cardiovascular status: blood pressure returned to baseline and stable Postop Assessment: no signs of nausea or vomiting Anesthetic complications: no    Last Vitals:  Vitals:   06/04/16 1125 06/04/16 1135  BP: (!) 109/59 125/65  Pulse: 75 74  Resp: 13 (!) 21  Temp:      Last Pain:  Vitals:   06/04/16 1115  TempSrc: Tympanic                 Lenard SimmerAndrew Anahid Eskelson

## 2016-06-04 NOTE — H&P (Signed)
Outpatient short stay form Pre-procedure 06/04/2016 9:38 AM Katrina DeemMartin U Achol Azpeitia MD  Primary Physician: Dr. Acey LavJames Manor  Reason for visit:  EGD and colonoscopy  History of present illness:  Patient is a 70 year old female presenting today as above. She has a history of a recent finding of Hemoccult positive stool. Does take NSAIDs on a daily basis. She is taking a low-dose of proton pump inhibitor, omeprazole 20 mg daily. She denies use of other NSAIDs or aspirin products. She takes no blood thinning agent. She is also had a change in bowel habits and occasionally sees some blood on the toilet paper. She tolerated her prep well.    Current Facility-Administered Medications:  .  0.9 %  sodium chloride infusion, , Intravenous, Continuous, Katrina DeemMartin U Zayn Selley, MD, Last Rate: 20 mL/hr at 06/04/16 0847 .  0.9 %  sodium chloride infusion, , Intravenous, Continuous, Katrina DeemMartin U Aniella Wandrey, MD  Prescriptions Prior to Admission  Medication Sig Dispense Refill Last Dose  . amLODipine (NORVASC) 5 MG tablet Take 5 mg by mouth every morning. am   06/04/2016 at 0630  . HYDROcodone-acetaminophen (NORCO/VICODIN) 5-325 MG tablet Take 1-2 tablets by mouth every 4 (four) hours as needed for moderate pain. 80 tablet 0 06/04/2016 at 0630  . metoprolol (LOPRESSOR) 50 MG tablet Take 50 mg by mouth every morning. am   06/04/2016 at 0630  . acetaminophen-codeine (TYLENOL #3) 300-30 MG tablet Take 1 tablet by mouth every 6 (six) hours as needed for moderate pain.   11/24/2015 at Unknown time  . celecoxib (CELEBREX) 200 MG capsule Take 200 mg by mouth every morning.   11/24/2015 at Unknown time  . clonazePAM (KLONOPIN) 0.5 MG tablet Take 0.5 mg by mouth at bedtime.   11/24/2015 at Unknown time  . enoxaparin (LOVENOX) 40 MG/0.4ML injection Inject 0.4 mLs (40 mg total) into the skin daily. 14 Syringe 0   . fluticasone (FLOVENT HFA) 44 MCG/ACT inhaler Inhale 1 puff into the lungs as needed. Reported on 11/25/2015   Past Month at Unknown  time  . fosinopril (MONOPRIL) 40 MG tablet Take 40 mg by mouth every morning. am   11/25/2015 at Unknown time  . gabapentin (NEURONTIN) 300 MG capsule Take 600 mg by mouth at bedtime.    11/24/2015 at Unknown time  . hydrochlorothiazide (HYDRODIURIL) 25 MG tablet Take 25 mg by mouth every morning. am   11/24/2015 at Unknown time  . meloxicam (MOBIC) 7.5 MG tablet Take 7.5 mg by mouth 2 (two) times daily. Am and pm   Past Month at Unknown time  . mometasone (NASONEX) 50 MCG/ACT nasal spray Place 2 sprays into the nose daily. (Patient not taking: Reported on 11/25/2015) 17 g 0 Completed Course at Unknown time  . Multiple Vitamin (MULTIVITAMIN) tablet Take 1 tablet by mouth every morning. am   11/24/2015 at Unknown time  . ranitidine (ZANTAC) 150 MG tablet Take 150 mg by mouth every morning. Reported on 11/25/2015   11/24/2015 at Unknown time  . Sennosides-Docusate Sodium (STOOL SOFTENER LAXATIVE PO) Take 1 capsule by mouth as needed (1-2 capsules as needed for constipation). am   11/24/2015 at Unknown time  . traMADol (ULTRAM) 50 MG tablet Take 1-2 tablets (50-100 mg total) by mouth every 4 (four) hours as needed for moderate pain. 60 tablet 1   . zolpidem (AMBIEN) 5 MG tablet Take 5 mg by mouth at bedtime.   11/24/2015 at Unknown time     Allergies  Allergen Reactions  . Beef-Derived Products Other (  See Comments)    Tears up my stomach.  . Sulfa Antibiotics Other (See Comments)    Altered Metal Status  . Zithromax [Azithromycin] Hives     Past Medical History:  Diagnosis Date  . Arthritis    osteo of left hip  . Asthma    humidity and exercise during summer  . GERD (gastroesophageal reflux disease)   . Hypertension    controlled on meds  . Meniere syndrome    hearing deaf right ear  . Neuromuscular disorder (HCC)    finger tips go numb, neck vertebrae  . Vertigo   . Wears dentures    full dentures on top, partial bottom    Review of systems:      Physical Exam    Heart and lungs:  Regular rate and rhythm without rub or gallop, lungs are bilaterally clear.    HEENT: Normocephalic atraumatic eyes are anicteric    Other:     Pertinant exam for procedure: Soft nontender nondistended bowel sounds positive normoactive.    Planned proceedures: G, colonoscopy and indicated procedures. I have discussed the risks benefits and complications of procedures to include not limited to bleeding, infection, perforation and the risk of sedation and the patient wishes to proceed.    Katrina Deem, MD Gastroenterology 06/04/2016  9:38 AM

## 2016-06-04 NOTE — Anesthesia Preprocedure Evaluation (Signed)
Anesthesia Evaluation  Patient identified by MRN, date of birth, ID band Patient awake    Reviewed: Allergy & Precautions, H&P , NPO status , Patient's Chart, lab work & pertinent test results, reviewed documented beta blocker date and time   History of Anesthesia Complications Negative for: history of anesthetic complications  Airway Mallampati: II  TM Distance: >3 FB Neck ROM: full    Dental no notable dental hx. (+) Edentulous Upper, Upper Dentures, Partial Lower   Pulmonary neg shortness of breath, asthma , neg sleep apnea, neg COPD, neg recent URI, former smoker,    Pulmonary exam normal breath sounds clear to auscultation       Cardiovascular Exercise Tolerance: Good hypertension, (-) angina(-) CAD, (-) Past MI, (-) Cardiac Stents and (-) CABG Normal cardiovascular exam(-) dysrhythmias (-) Valvular Problems/Murmurs Rhythm:regular Rate:Normal     Neuro/Psych neg Seizures  Neuromuscular disease (cervical stenosis) negative psych ROS   GI/Hepatic Neg liver ROS, GERD  ,  Endo/Other  neg diabetesMorbid obesity  Renal/GU negative Renal ROS  negative genitourinary   Musculoskeletal   Abdominal   Peds  Hematology negative hematology ROS (+)   Anesthesia Other Findings Past Medical History: No date: Arthritis     Comment: osteo of left hip No date: Asthma     Comment: humidity and exercise during summer No date: GERD (gastroesophageal reflux disease) No date: Hypertension     Comment: controlled on meds No date: Meniere syndrome     Comment: hearing deaf right ear No date: Neuromuscular disorder (HCC)     Comment: finger tips go numb, neck vertebrae No date: Vertigo No date: Wears dentures     Comment: full dentures on top, partial bottom   Reproductive/Obstetrics negative OB ROS                             Anesthesia Physical Anesthesia Plan  ASA: III  Anesthesia Plan: General    Post-op Pain Management:    Induction:   Airway Management Planned:   Additional Equipment:   Intra-op Plan:   Post-operative Plan:   Informed Consent: I have reviewed the patients History and Physical, chart, labs and discussed the procedure including the risks, benefits and alternatives for the proposed anesthesia with the patient or authorized representative who has indicated his/her understanding and acceptance.   Dental Advisory Given  Plan Discussed with: Anesthesiologist, CRNA and Surgeon  Anesthesia Plan Comments:         Anesthesia Quick Evaluation

## 2016-06-04 NOTE — Op Note (Addendum)
[facility] Gastroenterology Patient Name: Katrina Poole Procedure Date: 06/04/2016 9:31 AM MRN: [id number] Account #: [id number] Date of Birth: Dec 31, 1945 Admit Type: Outpatient Age: 70 Room: [facility] ENDO ROOM 4 Gender: Female Note Status: Supervisor Override Procedure:            Colonoscopy Indications:          Heme positive stool Providers:            Christena Deem, MD Referring MD:         Illene Labrador. Manor, MD (Referring MD) Medicines:            Monitored Anesthesia Care Complications:        No immediate complications. Procedure:            Pre-Anesthesia Assessment:                       - ASA Grade Assessment: III - A patient with severe                        systemic disease.                       After obtaining informed consent, the colonoscope was                        passed under direct vision. Throughout the procedure,                        the patient's blood pressure, pulse, and oxygen                        saturations were monitored continuously. The                        Colonoscope was introduced through the anus and                        advanced to the the cecum, identified by appendiceal                        orifice and ileocecal valve. The colonoscopy was                        performed without difficulty. The patient tolerated the                        procedure well. Findings:      A 5 mm polyp was found in the rectum. The polyp was sessile. The polyp       was removed with a cold snare. Resection and retrieval were complete.      A few small-mouthed diverticula were found in the sigmoid colon and       distal descending colon.      A single medium-sized localized angioectasia with typical arborization       was found in the cecum. Fulguration to ablate the lesion by argon beam       at 0.5 liters/minute and 20 watts was successful.      The digital rectal exam was normal. Impression:           - One 5 mm polyp in the  rectum, removed with a  cold                        snare. Resected and retrieved.                       - Diverticulosis in the sigmoid colon and in the distal                        descending colon.                       - A single colonic angioectasia. Treated with argon                        beam coagulation. Recommendation:       - Discharge patient to home.                       - Return to GI clinic in 2 weeks.                       - consider reevaluation of cecum lesion in 2 months. Procedure Code(s):    --- Professional ---                       [phone], Colonoscopy, flexible; with ablation of                        tumor(s), polyp(s), or other lesion(s) (includes pre-                        and post-dilation and guide wire passage, when                        performed)                       [id number], 59, Colonoscopy, flexible; with removal of                        tumor(s), polyp(s), or other lesion(s) by snare                        technique Diagnosis Code(s):    --- Professional ---                       K62.1, Rectal polyp                       K55.20, Angiodysplasia of colon without hemorrhage                       R19.5, Other fecal abnormalities                       K57.30, Diverticulosis of large intestine without                        perforation or abscess without bleeding CPT copyright 2016 [employer]. All rights reserved. The codes documented in this report are preliminary and upon coder review may  be revised to meet current compliance requirements. Christena Deem, MD 06/04/2016 11:19:00 AM This report has been  signed electronically. Number of Addenda: 0 Note Initiated On: 06/04/2016 9:31 AM Scope Withdrawal Time: 0 hours 38 minutes 20 seconds  Total Procedure Duration: 0 hours 52 minutes 43 seconds       [facility]

## 2016-06-04 NOTE — Op Note (Signed)
[facility] Gastroenterology Patient Name: Katrina Poole Procedure Date: 06/04/2016 9:31 AM MRN: [id number] Account #: [id number] Date of Birth: Apr 18, 1946 Admit Type: Outpatient Age: 70 Room: [facility] ENDO ROOM 4 Gender: Female Note Status: Finalized Procedure:            Upper GI endoscopy Indications:          Gastro-esophageal reflux disease, Heme positive stool Providers:            Christena Deem, MD Referring MD:         Illene Labrador. Manor, MD (Referring MD) Medicines:            Monitored Anesthesia Care Complications:        No immediate complications. Procedure:            Pre-Anesthesia Assessment:                       - ASA Grade Assessment: III - A patient with severe                        systemic disease.                       After obtaining informed consent, the endoscope was                        passed under direct vision. Throughout the procedure,                        the patient's blood pressure, pulse, and oxygen                        saturations were monitored continuously. The Endoscope                        was introduced through the mouth, and advanced to the                        third part of duodenum. The upper GI endoscopy was                        accomplished without difficulty. The patient tolerated                        the procedure well. Findings:      The Z-line was variable. Biopsies were taken with a cold forceps for       histology.      The exam of the esophagus was otherwise normal.      Patchy minimal inflammation characterized by erythema was found in the       prepyloric region of the stomach.      Diffuse mild inflammation characterized by congestion (edema) and       erythema was found in the gastric body.      A small sliding hiatal hernia was present.      Biopsies were taken with a cold forceps in the gastric body and in the       gastric antrum for histology.      The cardia and gastric fundus were  normal on retroflexion.      Diffuse mild mucosal variance characterized by altered texture was found  in the entire duodenum. Biopsies were taken with a cold forceps for       histology. Impression:           - Z-line variable. Biopsied.                       - Gastritis.                       - Gastritis.                       - Small hiatal hernia.                       - Mucosal variant in the duodenum. Biopsied.                       - Biopsies were taken with a cold forceps for histology                        in the gastric body and in the gastric antrum. Recommendation:       - Use Prilosec (omeprazole) 40 mg PO daily daily. Procedure Code(s):    --- Professional ---                       [phone], Esophagogastroduodenoscopy, flexible, transoral;                        with biopsy, single or multiple Diagnosis Code(s):    --- Professional ---                       K22.8, Other specified diseases of esophagus                       K29.70, Gastritis, unspecified, without bleeding                       K44.9, Diaphragmatic hernia without obstruction or                        gangrene                       K31.89, Other diseases of stomach and duodenum                       K21.9, Gastro-esophageal reflux disease without                        esophagitis                       R19.5, Other fecal abnormalities CPT copyright 2016 American Medical Association. All rights reserved. The codes documented in this report are preliminary and upon coder review may  be revised to meet current compliance requirements. Christena DeemMartin U Amauris Debois, MD 06/04/2016 10:12:43 AM This report has been signed electronically. Number of Addenda: 0 Note Initiated On: 06/04/2016 9:31 AM      [facility]

## 2016-06-05 LAB — SURGICAL PATHOLOGY

## 2016-06-18 ENCOUNTER — Encounter: Payer: Self-pay | Admitting: Gastroenterology

## 2017-09-02 ENCOUNTER — Other Ambulatory Visit: Payer: Self-pay | Admitting: Student

## 2017-09-02 DIAGNOSIS — M5412 Radiculopathy, cervical region: Secondary | ICD-10-CM

## 2017-09-07 ENCOUNTER — Ambulatory Visit
Admission: RE | Admit: 2017-09-07 | Discharge: 2017-09-07 | Disposition: A | Discharge status: Home / Self Care | Payer: Medicare Other | Source: Ambulatory Visit | Attending: Student | Admitting: Student

## 2017-09-07 DIAGNOSIS — M4802 Spinal stenosis, cervical region: Secondary | ICD-10-CM | POA: Diagnosis not present

## 2017-09-07 DIAGNOSIS — M5412 Radiculopathy, cervical region: Secondary | ICD-10-CM | POA: Insufficient documentation

## 2017-09-07 DIAGNOSIS — M5124 Other intervertebral disc displacement, thoracic region: Secondary | ICD-10-CM | POA: Diagnosis not present

## 2018-02-06 DIAGNOSIS — M179 Osteoarthritis of knee, unspecified: Secondary | ICD-10-CM | POA: Insufficient documentation

## 2018-02-08 ENCOUNTER — Other Ambulatory Visit: Payer: Self-pay | Admitting: Neurosurgery

## 2018-03-09 ENCOUNTER — Other Ambulatory Visit: Payer: Self-pay

## 2018-03-09 ENCOUNTER — Encounter (HOSPITAL_COMMUNITY)
Admission: RE | Admit: 2018-03-09 | Discharge: 2018-03-09 | Disposition: A | Discharge status: Home / Self Care | Payer: Medicare Other | Source: Ambulatory Visit | Attending: Neurosurgery | Admitting: Neurosurgery

## 2018-03-09 ENCOUNTER — Encounter (HOSPITAL_COMMUNITY): Payer: Self-pay

## 2018-03-09 DIAGNOSIS — Z01810 Encounter for preprocedural cardiovascular examination: Secondary | ICD-10-CM | POA: Insufficient documentation

## 2018-03-09 DIAGNOSIS — R9431 Abnormal electrocardiogram [ECG] [EKG]: Secondary | ICD-10-CM | POA: Insufficient documentation

## 2018-03-09 DIAGNOSIS — Z01812 Encounter for preprocedural laboratory examination: Secondary | ICD-10-CM | POA: Insufficient documentation

## 2018-03-09 LAB — BASIC METABOLIC PANEL
Anion gap: 11 (ref 5–15)
BUN: 21 mg/dL — ABNORMAL HIGH (ref 6–20)
CALCIUM: 9.2 mg/dL (ref 8.9–10.3)
CHLORIDE: 97 mmol/L — AB (ref 101–111)
CO2: 24 mmol/L (ref 22–32)
Creatinine, Ser: 0.86 mg/dL (ref 0.44–1.00)
Glucose, Bld: 89 mg/dL (ref 65–99)
Potassium: 3.6 mmol/L (ref 3.5–5.1)
SODIUM: 132 mmol/L — AB (ref 135–145)

## 2018-03-09 LAB — ABO/RH: ABO/RH(D): A POS

## 2018-03-09 LAB — TYPE AND SCREEN
ABO/RH(D): A POS
ANTIBODY SCREEN: NEGATIVE

## 2018-03-09 LAB — SURGICAL PCR SCREEN
MRSA, PCR: NEGATIVE
Staphylococcus aureus: NEGATIVE

## 2018-03-09 LAB — CBC
HCT: 35.3 % — ABNORMAL LOW (ref 36.0–46.0)
Hemoglobin: 11.5 g/dL — ABNORMAL LOW (ref 12.0–15.0)
MCH: 28.4 pg (ref 26.0–34.0)
MCHC: 32.6 g/dL (ref 30.0–36.0)
MCV: 87.2 fL (ref 78.0–100.0)
PLATELETS: 309 10*3/uL (ref 150–400)
RBC: 4.05 MIL/uL (ref 3.87–5.11)
RDW: 14.3 % (ref 11.5–15.5)
WBC: 7.8 10*3/uL (ref 4.0–10.5)

## 2018-03-09 MED ORDER — CHLORHEXIDINE GLUCONATE CLOTH 2 % EX PADS: 6.0000 | MEDICATED_PAD | Freq: Once | CUTANEOUS | Status: DC

## 2018-03-09 NOTE — Pre-Procedure Instructions (Signed)
Katrina Poole  03/09/2018      [id number] - [address], [place] - [name] [place] [address] Phone: [phone] Fax: [phone]    Your procedure is scheduled on March 16, 2018.  Report to [facility] Admitting at 630 AM.  Call this number if you have problems the morning of surgery:  [phone]   Remember:  No food or drink after midnight.  Continue all medications as directed by your physician except follow these medication instructions before surgery below    Take these medicines the morning of surgery with A SIP OF WATER  Tylenol-if needed Albuterol inhaler-if needed (bring inhaler with you) Amlodipine (norvasc) Metoprolol (toprol) Omeprazole (prilosec)  7 days prior to surgery STOP taking any meloxicam (mobic), Aspirin (unless otherwise instructed by your surgeon), Aleve, Naproxen, Ibuprofen, Motrin, Advil, Goody's, BC's, all herbal medications, fish oil, and all vitamins    Do not wear jewelry, make-up or nail polish.  Do not wear lotions, powders, or perfumes, or deodorant.  Do not shave 48 hours prior to surgery.    Do not bring valuables to the hospital.  [facility] is not responsible for any belongings or valuables.  Contacts, dentures or bridgework may not be worn into surgery.  Leave your suitcase in the car.  After surgery it may be brought to your room.  For patients admitted to the hospital, discharge time will be determined by your treatment team.  Patients discharged the day of surgery will not be allowed to drive home.    [facility]- Preparing For Surgery  Before surgery, you can play an important role. Because skin is not sterile, your skin needs to be as free of germs as possible. You can reduce the number of germs on your skin by washing with CHG (chlorahexidine gluconate) Soap before surgery.  CHG is an antiseptic cleaner which kills germs and bonds with the skin to continue killing germs even  after washing.    Oral Hygiene is also important to reduce your risk of infection.  Remember - BRUSH YOUR TEETH THE MORNING OF SURGERY WITH YOUR REGULAR TOOTHPASTE  Please do not use if you have an allergy to CHG or antibacterial soaps. If your skin becomes reddened/irritated stop using the CHG.  Do not shave (including legs and underarms) for at least 48 hours prior to first CHG shower. It is OK to shave your face.  Please follow these instructions carefully.   1. Shower the NIGHT BEFORE SURGERY and the MORNING OF SURGERY with CHG.   2. If you chose to wash your hair, wash your hair first as usual with your normal shampoo.  3. After you shampoo, rinse your hair and body thoroughly to remove the shampoo.  4. Use CHG as you would any other liquid soap. You can apply CHG directly to the skin and wash gently with a scrungie or a clean washcloth.   5. Apply the CHG Soap to your body ONLY FROM THE NECK DOWN.  Do not use on open wounds or open sores. Avoid contact with your eyes, ears, mouth and genitals (private parts). Wash Face and genitals (private parts)  with your normal soap.  6. Wash thoroughly, paying special attention to the area where your surgery will be performed.  7. Thoroughly rinse your body with warm water from the neck down.  8. DO NOT shower/wash with your normal soap after using and rinsing off the CHG Soap.  9. Pat yourself dry with a CLEAN TOWEL.  10. Wear CLEAN PAJAMAS to bed the night before surgery, wear comfortable clothes the morning of surgery  11. Place CLEAN SHEETS on your bed the night of your first shower and DO NOT SLEEP WITH PETS.  Day of Surgery:  Do not apply any deodorants/lotions.  Please wear clean clothes to the hospital/surgery center.   Remember to brush your teeth WITH YOUR REGULAR TOOTHPASTE.  [facility]- Preparing For Surgery  Before surgery, you can play an important role. Because skin is not sterile, your skin needs to be as free of  germs as possible. You can reduce the number of germs on your skin by washing with CHG (chlorahexidine gluconate) Soap before surgery.  CHG is an antiseptic cleaner which kills germs and bonds with the skin to continue killing germs even after washing.    Oral Hygiene is also important to reduce your risk of infection.  Remember - BRUSH YOUR TEETH THE MORNING OF SURGERY WITH YOUR REGULAR TOOTHPASTE  Please do not use if you have an allergy to CHG or antibacterial soaps. If your skin becomes reddened/irritated stop using the CHG.  Do not shave (including legs and underarms) for at least 48 hours prior to first CHG shower. It is OK to shave your face.  Please follow these instructions carefully.   12. Shower the NIGHT BEFORE SURGERY and the MORNING OF SURGERY with CHG.   13. If you chose to wash your hair, wash your hair first as usual with your normal shampoo.  14. After you shampoo, rinse your hair and body thoroughly to remove the shampoo.  15. Use CHG as you would any other liquid soap. You can apply CHG directly to the skin and wash gently with a scrungie or a clean washcloth.   16. Apply the CHG Soap to your body ONLY FROM THE NECK DOWN.  Do not use on open wounds or open sores. Avoid contact with your eyes, ears, mouth and genitals (private parts). Wash Face and genitals (private parts)  with your normal soap.  17. Wash thoroughly, paying special attention to the area where your surgery will be performed.  18. Thoroughly rinse your body with warm water from the neck down.  19. DO NOT shower/wash with your normal soap after using and rinsing off the CHG Soap.  20. Pat yourself dry with a CLEAN TOWEL.  21. Wear CLEAN PAJAMAS to bed the night before surgery, wear comfortable clothes the morning of surgery  22. Place CLEAN SHEETS on your bed the night of your first shower and DO NOT SLEEP WITH PETS.  Day of Surgery:  Do not apply any deodorants/lotions.  Please wear clean clothes  to the hospital/surgery center.   Remember to brush your teeth WITH YOUR REGULAR TOOTHPASTE.   Please read over the following fact sheets that you were given. Pain Booklet, Coughing and Deep Breathing, MRSA Information and Surgical Site Infection Prevention

## 2018-03-09 NOTE — Progress Notes (Addendum)
PCP: Acey LavJames Manor, MD  Cardiologist: pt denies  EKG: 03/09/18 in EPIC  Stress test: pt denies  ECHO: pt denies  Cardiac Cath: pt denies  Chest x-ray: pt denies past year, no recent respiratory infections/complications  Patient reports she was told surgery is for cervical 3-4 and 5-6...consent order states cervical 5-6 and 6-7.  Called and spoke with Erie NoeVanessa at Dr. Lonie Peakram's office who is reviewing case and will change consent order (if needed) and contact patient to further explain,

## 2018-03-16 ENCOUNTER — Inpatient Hospital Stay (HOSPITAL_COMMUNITY): Payer: Medicare Other | Admitting: Anesthesiology

## 2018-03-16 ENCOUNTER — Inpatient Hospital Stay (HOSPITAL_COMMUNITY)
Admission: RE | Admit: 2018-03-16 | Discharge: 2018-03-17 | DRG: 473 | Disposition: A | Discharge status: Home / Self Care | Payer: Medicare Other | Source: Ambulatory Visit | Attending: Neurosurgery | Admitting: Neurosurgery

## 2018-03-16 ENCOUNTER — Inpatient Hospital Stay (HOSPITAL_COMMUNITY)
Admission: RE | Disposition: A | Discharge status: Home / Self Care | Payer: Self-pay | Source: Ambulatory Visit | Attending: Neurosurgery

## 2018-03-16 ENCOUNTER — Encounter (HOSPITAL_COMMUNITY): Payer: Self-pay | Admitting: Certified Registered Nurse Anesthetist

## 2018-03-16 ENCOUNTER — Inpatient Hospital Stay (HOSPITAL_COMMUNITY): Payer: Medicare Other

## 2018-03-16 DIAGNOSIS — Z881 Allergy status to other antibiotic agents status: Secondary | ICD-10-CM

## 2018-03-16 DIAGNOSIS — Z882 Allergy status to sulfonamides status: Secondary | ICD-10-CM | POA: Diagnosis not present

## 2018-03-16 DIAGNOSIS — K219 Gastro-esophageal reflux disease without esophagitis: Secondary | ICD-10-CM | POA: Diagnosis present

## 2018-03-16 DIAGNOSIS — I10 Essential (primary) hypertension: Secondary | ICD-10-CM | POA: Diagnosis present

## 2018-03-16 DIAGNOSIS — Z825 Family history of asthma and other chronic lower respiratory diseases: Secondary | ICD-10-CM

## 2018-03-16 DIAGNOSIS — J45909 Unspecified asthma, uncomplicated: Secondary | ICD-10-CM | POA: Diagnosis present

## 2018-03-16 DIAGNOSIS — Z91018 Allergy to other foods: Secondary | ICD-10-CM | POA: Diagnosis not present

## 2018-03-16 DIAGNOSIS — H9191 Unspecified hearing loss, right ear: Secondary | ICD-10-CM | POA: Diagnosis present

## 2018-03-16 DIAGNOSIS — R402362 Coma scale, best motor response, obeys commands, at arrival to emergency department: Secondary | ICD-10-CM | POA: Diagnosis present

## 2018-03-16 DIAGNOSIS — Z96642 Presence of left artificial hip joint: Secondary | ICD-10-CM | POA: Diagnosis present

## 2018-03-16 DIAGNOSIS — M47812 Spondylosis without myelopathy or radiculopathy, cervical region: Secondary | ICD-10-CM | POA: Diagnosis present

## 2018-03-16 DIAGNOSIS — R402252 Coma scale, best verbal response, oriented, at arrival to emergency department: Secondary | ICD-10-CM | POA: Diagnosis present

## 2018-03-16 DIAGNOSIS — R402142 Coma scale, eyes open, spontaneous, at arrival to emergency department: Secondary | ICD-10-CM | POA: Diagnosis present

## 2018-03-16 DIAGNOSIS — Z96653 Presence of artificial knee joint, bilateral: Secondary | ICD-10-CM | POA: Diagnosis present

## 2018-03-16 DIAGNOSIS — Z87891 Personal history of nicotine dependence: Secondary | ICD-10-CM | POA: Diagnosis not present

## 2018-03-16 DIAGNOSIS — H8109 Meniere's disease, unspecified ear: Secondary | ICD-10-CM | POA: Diagnosis present

## 2018-03-16 DIAGNOSIS — Z791 Long term (current) use of non-steroidal anti-inflammatories (NSAID): Secondary | ICD-10-CM

## 2018-03-16 DIAGNOSIS — Z8249 Family history of ischemic heart disease and other diseases of the circulatory system: Secondary | ICD-10-CM

## 2018-03-16 DIAGNOSIS — Z7901 Long term (current) use of anticoagulants: Secondary | ICD-10-CM | POA: Diagnosis not present

## 2018-03-16 DIAGNOSIS — Z419 Encounter for procedure for purposes other than remedying health state, unspecified: Secondary | ICD-10-CM

## 2018-03-16 DIAGNOSIS — M25512 Pain in left shoulder: Secondary | ICD-10-CM | POA: Diagnosis present

## 2018-03-16 HISTORY — PX: ANTERIOR CERVICAL DECOMP/DISCECTOMY FUSION: SHX1161

## 2018-03-16 SURGERY — ANTERIOR CERVICAL DECOMPRESSION/DISCECTOMY FUSION 2 LEVELS
Anesthesia: General

## 2018-03-16 MED ORDER — ACETAMINOPHEN 325 MG PO TABS
650.0000 mg | ORAL_TABLET | ORAL | Status: DC | PRN
  Administered 2018-03-16: 650 mg via ORAL

## 2018-03-16 MED ORDER — PHENYLEPHRINE HCL 10 MG/ML IJ SOLN
INTRAVENOUS | Status: DC | PRN
  Administered 2018-03-16: 10 ug/min via INTRAVENOUS

## 2018-03-16 MED ORDER — ZOLPIDEM TARTRATE 5 MG PO TABS
5.0000 mg | ORAL_TABLET | Freq: Every day | ORAL | Status: DC
  Administered 2018-03-16: 5 mg via ORAL
  Filled 2018-03-16: qty 1

## 2018-03-16 MED ORDER — LIDOCAINE 2% (20 MG/ML) 5 ML SYRINGE
INTRAMUSCULAR | Status: AC
  Filled 2018-03-16: qty 5

## 2018-03-16 MED ORDER — FENTANYL CITRATE (PF) 100 MCG/2ML IJ SOLN: 25.0000 ug | INTRAMUSCULAR | Status: DC | PRN

## 2018-03-16 MED ORDER — LIDOCAINE HCL (CARDIAC) PF 100 MG/5ML IV SOSY
PREFILLED_SYRINGE | INTRAVENOUS | Status: DC | PRN
  Administered 2018-03-16: 40 mg via INTRAVENOUS

## 2018-03-16 MED ORDER — ALUM & MAG HYDROXIDE-SIMETH 200-200-20 MG/5ML PO SUSP: 30.0000 mL | Freq: Four times a day (QID) | ORAL | Status: DC | PRN

## 2018-03-16 MED ORDER — 0.9 % SODIUM CHLORIDE (POUR BTL) OPTIME
TOPICAL | Status: DC | PRN
  Administered 2018-03-16: 1000 mL

## 2018-03-16 MED ORDER — CEFAZOLIN SODIUM-DEXTROSE 2-4 GM/100ML-% IV SOLN
2.0000 g | INTRAVENOUS | Status: AC
  Administered 2018-03-16: 2 g via INTRAVENOUS
  Filled 2018-03-16: qty 100

## 2018-03-16 MED ORDER — MIDAZOLAM HCL 5 MG/5ML IJ SOLN
INTRAMUSCULAR | Status: DC | PRN
  Administered 2018-03-16: 2 mg via INTRAVENOUS

## 2018-03-16 MED ORDER — ONDANSETRON HCL 4 MG/2ML IJ SOLN: 4.0000 mg | Freq: Four times a day (QID) | INTRAMUSCULAR | Status: DC | PRN

## 2018-03-16 MED ORDER — ONDANSETRON HCL 4 MG/2ML IJ SOLN: 4.0000 mg | Freq: Once | INTRAMUSCULAR | Status: DC | PRN

## 2018-03-16 MED ORDER — ROCURONIUM BROMIDE 10 MG/ML (PF) SYRINGE
PREFILLED_SYRINGE | INTRAVENOUS | Status: AC
  Filled 2018-03-16: qty 10

## 2018-03-16 MED ORDER — DEXAMETHASONE SODIUM PHOSPHATE 10 MG/ML IJ SOLN
10.0000 mg | INTRAMUSCULAR | Status: DC
  Filled 2018-03-16: qty 1

## 2018-03-16 MED ORDER — THROMBIN (RECOMBINANT) 20000 UNITS EX SOLR
CUTANEOUS | Status: AC
  Filled 2018-03-16: qty 20000

## 2018-03-16 MED ORDER — ALBUTEROL SULFATE (2.5 MG/3ML) 0.083% IN NEBU: 3.0000 mL | INHALATION_SOLUTION | Freq: Four times a day (QID) | RESPIRATORY_TRACT | Status: DC | PRN

## 2018-03-16 MED ORDER — PHENYLEPHRINE 40 MCG/ML (10ML) SYRINGE FOR IV PUSH (FOR BLOOD PRESSURE SUPPORT)
PREFILLED_SYRINGE | INTRAVENOUS | Status: AC
  Filled 2018-03-16: qty 10

## 2018-03-16 MED ORDER — SUGAMMADEX SODIUM 200 MG/2ML IV SOLN
INTRAVENOUS | Status: AC
  Filled 2018-03-16: qty 2

## 2018-03-16 MED ORDER — SUGAMMADEX SODIUM 200 MG/2ML IV SOLN
INTRAVENOUS | Status: DC | PRN
  Administered 2018-03-16: 200 mg via INTRAVENOUS

## 2018-03-16 MED ORDER — FENTANYL CITRATE (PF) 250 MCG/5ML IJ SOLN
INTRAMUSCULAR | Status: AC
  Filled 2018-03-16: qty 5

## 2018-03-16 MED ORDER — ROCURONIUM BROMIDE 100 MG/10ML IV SOLN
INTRAVENOUS | Status: DC | PRN
  Administered 2018-03-16: 50 mg via INTRAVENOUS

## 2018-03-16 MED ORDER — ONDANSETRON HCL 4 MG/2ML IJ SOLN
INTRAMUSCULAR | Status: AC
  Filled 2018-03-16: qty 2

## 2018-03-16 MED ORDER — PHENYLEPHRINE HCL 10 MG/ML IJ SOLN
INTRAMUSCULAR | Status: DC | PRN
  Administered 2018-03-16 (×5): 80 ug via INTRAVENOUS

## 2018-03-16 MED ORDER — THROMBIN (RECOMBINANT) 5000 UNITS EX SOLR
CUTANEOUS | Status: AC
  Filled 2018-03-16: qty 15000

## 2018-03-16 MED ORDER — ADULT MULTIVITAMIN W/MINERALS CH
1.0000 | ORAL_TABLET | ORAL | Status: DC
  Filled 2018-03-16: qty 1

## 2018-03-16 MED ORDER — AMLODIPINE BESYLATE 5 MG PO TABS: 5.0000 mg | ORAL_TABLET | Freq: Every day | ORAL | Status: DC

## 2018-03-16 MED ORDER — LACTATED RINGERS IV SOLN
INTRAVENOUS | Status: DC | PRN
  Administered 2018-03-16: 08:00:00 via INTRAVENOUS

## 2018-03-16 MED ORDER — DEXAMETHASONE SODIUM PHOSPHATE 4 MG/ML IJ SOLN
INTRAMUSCULAR | Status: DC | PRN
  Administered 2018-03-16: 10 mg via INTRAVENOUS

## 2018-03-16 MED ORDER — THROMBIN 20000 UNITS EX SOLR
CUTANEOUS | Status: DC | PRN
  Administered 2018-03-16: 10:00:00 via TOPICAL

## 2018-03-16 MED ORDER — METOPROLOL SUCCINATE ER 50 MG PO TB24: 50.0000 mg | ORAL_TABLET | Freq: Every day | ORAL | Status: DC

## 2018-03-16 MED ORDER — PROPOFOL 10 MG/ML IV BOLUS
INTRAVENOUS | Status: DC | PRN
  Administered 2018-03-16: 150 mg via INTRAVENOUS

## 2018-03-16 MED ORDER — ACETAMINOPHEN 325 MG PO TABS
ORAL_TABLET | ORAL | Status: AC
  Filled 2018-03-16: qty 2

## 2018-03-16 MED ORDER — FOSINOPRIL SODIUM 20 MG PO TABS
40.0000 mg | ORAL_TABLET | ORAL | Status: DC
  Filled 2018-03-16: qty 2

## 2018-03-16 MED ORDER — HYDROMORPHONE HCL 1 MG/ML IJ SOLN: 0.5000 mg | INTRAMUSCULAR | Status: DC | PRN

## 2018-03-16 MED ORDER — HYDROCHLOROTHIAZIDE 25 MG PO TABS: 25.0000 mg | ORAL_TABLET | Freq: Every day | ORAL | Status: DC

## 2018-03-16 MED ORDER — FENTANYL CITRATE (PF) 250 MCG/5ML IJ SOLN
INTRAMUSCULAR | Status: AC
Start: 2018-03-16 — End: ?
  Filled 2018-03-16: qty 5

## 2018-03-16 MED ORDER — CEFAZOLIN SODIUM-DEXTROSE 2-4 GM/100ML-% IV SOLN
2.0000 g | Freq: Three times a day (TID) | INTRAVENOUS | Status: AC
  Administered 2018-03-16 (×2): 2 g via INTRAVENOUS
  Filled 2018-03-16 (×2): qty 100

## 2018-03-16 MED ORDER — SODIUM CHLORIDE 0.9 % IV SOLN
INTRAVENOUS | Status: DC | PRN
  Administered 2018-03-16: 10:00:00

## 2018-03-16 MED ORDER — PHENOL 1.4 % MT LIQD
1.0000 | OROMUCOSAL | Status: DC | PRN
  Filled 2018-03-16: qty 177

## 2018-03-16 MED ORDER — SODIUM CHLORIDE 0.9% FLUSH
3.0000 mL | Freq: Two times a day (BID) | INTRAVENOUS | Status: DC
  Administered 2018-03-16 (×2): 3 mL via INTRAVENOUS

## 2018-03-16 MED ORDER — SODIUM CHLORIDE 0.9% FLUSH: 3.0000 mL | INTRAVENOUS | Status: DC | PRN

## 2018-03-16 MED ORDER — OXYCODONE HCL 5 MG/5ML PO SOLN: 5.0000 mg | Freq: Once | ORAL | Status: DC | PRN

## 2018-03-16 MED ORDER — PANTOPRAZOLE SODIUM 40 MG PO TBEC: 80.0000 mg | DELAYED_RELEASE_TABLET | Freq: Every day | ORAL | Status: DC

## 2018-03-16 MED ORDER — NALOXONE HCL 0.4 MG/ML IJ SOLN
INTRAMUSCULAR | Status: AC
  Filled 2018-03-16: qty 1

## 2018-03-16 MED ORDER — PROPOFOL 10 MG/ML IV BOLUS
INTRAVENOUS | Status: AC
  Filled 2018-03-16: qty 20

## 2018-03-16 MED ORDER — ALUM HYDROXIDE-MAG TRISILICATE 80-20 MG PO CHEW
1.0000 | CHEWABLE_TABLET | Freq: Every day | ORAL | Status: DC | PRN
  Filled 2018-03-16: qty 1

## 2018-03-16 MED ORDER — MIDAZOLAM HCL 2 MG/2ML IJ SOLN
INTRAMUSCULAR | Status: AC
  Filled 2018-03-16: qty 2

## 2018-03-16 MED ORDER — ONDANSETRON HCL 4 MG PO TABS: 4.0000 mg | ORAL_TABLET | Freq: Four times a day (QID) | ORAL | Status: DC | PRN

## 2018-03-16 MED ORDER — GABAPENTIN 300 MG PO CAPS
600.0000 mg | ORAL_CAPSULE | Freq: Every day | ORAL | Status: DC
  Administered 2018-03-16: 600 mg via ORAL
  Filled 2018-03-16: qty 2

## 2018-03-16 MED ORDER — SUGAMMADEX SODIUM 200 MG/2ML IV SOLN
INTRAVENOUS | Status: AC
  Filled 2018-03-16: qty 4

## 2018-03-16 MED ORDER — ACETAMINOPHEN 650 MG RE SUPP: 650.0000 mg | RECTAL | Status: DC | PRN

## 2018-03-16 MED ORDER — OXYCODONE HCL 5 MG PO TABS: 5.0000 mg | ORAL_TABLET | Freq: Once | ORAL | Status: DC | PRN

## 2018-03-16 MED ORDER — MELOXICAM 7.5 MG PO TABS
7.5000 mg | ORAL_TABLET | Freq: Two times a day (BID) | ORAL | Status: DC
  Administered 2018-03-16: 7.5 mg via ORAL
  Filled 2018-03-16 (×3): qty 1

## 2018-03-16 MED ORDER — THROMBIN 5000 UNITS EX SOLR
OROMUCOSAL | Status: DC | PRN
  Administered 2018-03-16: 10:00:00 via TOPICAL

## 2018-03-16 MED ORDER — MENTHOL 3 MG MT LOZG: 1.0000 | LOZENGE | OROMUCOSAL | Status: DC | PRN

## 2018-03-16 MED ORDER — CLONAZEPAM 0.5 MG PO TABS
0.5000 mg | ORAL_TABLET | Freq: Every day | ORAL | Status: DC
  Administered 2018-03-16: 0.5 mg via ORAL
  Filled 2018-03-16: qty 1

## 2018-03-16 MED ORDER — ACETAMINOPHEN-CODEINE #3 300-30 MG PO TABS
2.0000 | ORAL_TABLET | Freq: Four times a day (QID) | ORAL | Status: DC | PRN
  Administered 2018-03-16 (×2): 2 via ORAL
  Filled 2018-03-16 (×2): qty 2

## 2018-03-16 MED ORDER — CYCLOBENZAPRINE HCL 10 MG PO TABS: 10.0000 mg | ORAL_TABLET | Freq: Three times a day (TID) | ORAL | Status: DC | PRN

## 2018-03-16 MED ORDER — FENTANYL CITRATE (PF) 100 MCG/2ML IJ SOLN
INTRAMUSCULAR | Status: DC | PRN
  Administered 2018-03-16 (×2): 50 ug via INTRAVENOUS
  Administered 2018-03-16 (×3): 100 ug via INTRAVENOUS
  Administered 2018-03-16: 50 ug via INTRAVENOUS
  Administered 2018-03-16 (×2): 100 ug via INTRAVENOUS

## 2018-03-16 MED ORDER — PANTOPRAZOLE SODIUM 40 MG PO TBEC: 40.0000 mg | DELAYED_RELEASE_TABLET | Freq: Every day | ORAL | Status: DC

## 2018-03-16 SURGICAL SUPPLY — 68 items
BAG DECANTER FOR FLEXI CONT (MISCELLANEOUS) ×3 IMPLANT
BASKET BONE COLLECTION (BASKET) ×3 IMPLANT
BENZOIN TINCTURE PRP APPL 2/3 (GAUZE/BANDAGES/DRESSINGS) ×3 IMPLANT
BIT DRILL 13 (BIT) ×2 IMPLANT
BIT DRILL 13MM (BIT) ×1
BIT DRILL NEURO 2X3.1 SFT TUCH (MISCELLANEOUS) ×1 IMPLANT
BONE VIVIGEN FORMABLE 1.3CC (Bone Implant) ×6 IMPLANT
BUR MATCHSTICK NEURO 3.0 LAGG (BURR) ×3 IMPLANT
CANISTER SUCT 3000ML PPV (MISCELLANEOUS) ×3 IMPLANT
CARTRIDGE OIL MAESTRO DRILL (MISCELLANEOUS) ×1 IMPLANT
CLOSURE WOUND 1/2 X4 (GAUZE/BANDAGES/DRESSINGS) ×1
DERMABOND ADVANCED (GAUZE/BANDAGES/DRESSINGS)
DERMABOND ADVANCED .7 DNX12 (GAUZE/BANDAGES/DRESSINGS) IMPLANT
DIFFUSER DRILL AIR PNEUMATIC (MISCELLANEOUS) ×3 IMPLANT
DRAIN SNY WOU 7FLT (WOUND CARE) ×3 IMPLANT
DRAPE C-ARM 42X72 X-RAY (DRAPES) ×6 IMPLANT
DRAPE LAPAROTOMY 100X72 PEDS (DRAPES) ×3 IMPLANT
DRAPE MICROSCOPE LEICA (MISCELLANEOUS) ×3 IMPLANT
DRESSING OPSITE X SMALL 2X3 (GAUZE/BANDAGES/DRESSINGS) ×3 IMPLANT
DRILL NEURO 2X3.1 SOFT TOUCH (MISCELLANEOUS) ×3
DRSG OPSITE POSTOP 4X6 (GAUZE/BANDAGES/DRESSINGS) ×3 IMPLANT
DURAPREP 6ML APPLICATOR 50/CS (WOUND CARE) ×3 IMPLANT
ELECT COATED BLADE 2.86 ST (ELECTRODE) ×3 IMPLANT
ELECT REM PT RETURN 9FT ADLT (ELECTROSURGICAL) ×3
ELECTRODE REM PT RTRN 9FT ADLT (ELECTROSURGICAL) ×1 IMPLANT
EVACUATOR SILICONE 100CC (DRAIN) ×3 IMPLANT
GAUZE SPONGE 4X4 12PLY STRL (GAUZE/BANDAGES/DRESSINGS) ×3 IMPLANT
GAUZE SPONGE 4X4 16PLY XRAY LF (GAUZE/BANDAGES/DRESSINGS) ×3 IMPLANT
GLOVE BIO SURGEON STRL SZ7 (GLOVE) IMPLANT
GLOVE BIO SURGEON STRL SZ8 (GLOVE) ×3 IMPLANT
GLOVE BIOGEL PI IND STRL 7.0 (GLOVE) IMPLANT
GLOVE BIOGEL PI INDICATOR 7.0 (GLOVE)
GLOVE EXAM NITRILE LRG STRL (GLOVE) IMPLANT
GLOVE EXAM NITRILE XL STR (GLOVE) IMPLANT
GLOVE EXAM NITRILE XS STR PU (GLOVE) IMPLANT
GLOVE INDICATOR 8.5 STRL (GLOVE) ×3 IMPLANT
GOWN STRL REUS W/ TWL LRG LVL3 (GOWN DISPOSABLE) IMPLANT
GOWN STRL REUS W/ TWL XL LVL3 (GOWN DISPOSABLE) ×1 IMPLANT
GOWN STRL REUS W/TWL 2XL LVL3 (GOWN DISPOSABLE) IMPLANT
GOWN STRL REUS W/TWL LRG LVL3 (GOWN DISPOSABLE)
GOWN STRL REUS W/TWL XL LVL3 (GOWN DISPOSABLE) ×2
HALTER HD/CHIN CERV TRACTION D (MISCELLANEOUS) ×3 IMPLANT
HEMOSTAT POWDER KIT SURGIFOAM (HEMOSTASIS) ×3 IMPLANT
KIT BASIN OR (CUSTOM PROCEDURE TRAY) ×3 IMPLANT
KIT TURNOVER KIT B (KITS) ×3 IMPLANT
NEEDLE SPNL 20GX3.5 QUINCKE YW (NEEDLE) ×3 IMPLANT
NS IRRIG 1000ML POUR BTL (IV SOLUTION) ×6 IMPLANT
OIL CARTRIDGE MAESTRO DRILL (MISCELLANEOUS) ×3
PACK LAMINECTOMY NEURO (CUSTOM PROCEDURE TRAY) ×3 IMPLANT
PAD ARMBOARD 7.5X6 YLW CONV (MISCELLANEOUS) ×9 IMPLANT
PIN DISTRACTION 14MM (PIN) IMPLANT
PLATE 4 75XNS SPNE CVD ANT T (Plate) ×1 IMPLANT
PLATE 4 ATLANTIS TRANS (Plate) ×2 IMPLANT
RUBBERBAND STERILE (MISCELLANEOUS) ×6 IMPLANT
SCREW ST 13X4XST VA NS SPNE (Screw) ×2 IMPLANT
SCREW ST FIX 4 ATL 3120213 (Screw) ×24 IMPLANT
SCREW ST VAR 4 ATL (Screw) ×4 IMPLANT
SPACER ACDF TPS LG PARALLEL 7 (Spacer) ×3 IMPLANT
SPACER COLONIAL ACDF TPS LG S6 (Spacer) ×9 IMPLANT
SPONGE INTESTINAL PEANUT (DISPOSABLE) ×3 IMPLANT
SPONGE SURGIFOAM ABS GEL SZ50 (HEMOSTASIS) ×6 IMPLANT
STRIP CLOSURE SKIN 1/2X4 (GAUZE/BANDAGES/DRESSINGS) ×2 IMPLANT
SUT VIC AB 3-0 SH 8-18 (SUTURE) ×3 IMPLANT
SUT VICRYL 4-0 PS2 18IN ABS (SUTURE) ×3 IMPLANT
TAPE CLOTH 4X10 WHT NS (GAUZE/BANDAGES/DRESSINGS) ×3 IMPLANT
TOWEL GREEN STERILE (TOWEL DISPOSABLE) ×3 IMPLANT
TOWEL GREEN STERILE FF (TOWEL DISPOSABLE) ×3 IMPLANT
WATER STERILE IRR 1000ML POUR (IV SOLUTION) ×3 IMPLANT

## 2018-03-16 NOTE — Evaluation (Signed)
Occupational Therapy Evaluation Patient Details Name: Katrina Poole MRN: [id number] DOB: [birth year] Today's Date: 03/16/2018    History of Present Illness Patient is a 72 yo female s/p Anterior cervical discectomies and fusion at C3-4, C4-5, C5-6, C6-7. PMH significant for arthritis, asthma, GERD, hypertension, Meniere syndrome, neuromuscular disorder, and vertigo. Past surgical history includes multiple bilateral knee surgeries, R shoulder surgery, and L total hip arthroplasty.   Clinical Impression   PTA, pt was independent with ADL and functional mobility. She currently demonstrates the ability to complete toilet and shower transfers independently. She and her husband were educated in detail concerning cervical precautions and compensatory strategies for dressing, bathing, grooming, and toileting tasks. She requires min assist for LB dressing and UB dressing tasks and educated concerning use of reacher to improve independence. She has this at home and verbalizes understanding of use. Her husband is able to demonstrate the ability to provide all necessary assistance. They both demonstrate good understanding of cervical precautions and report no further questions or concerns. No further acute OT needs identified. OT will sign off.     Follow Up Recommendations  No OT follow up;Supervision/Assistance - 24 hour(initial )    Equipment Recommendations  None recommended by OT    Recommendations for Other Services       Precautions / Restrictions Precautions Precautions: Cervical Precaution Booklet Issued: Yes (comment) Precaution Comments: reviewed with patient Required Braces or Orthoses: Cervical Brace Cervical Brace: Soft collar(off for shower) Restrictions Weight Bearing Restrictions: No      Mobility Bed Mobility Overal bed mobility: Independent             General bed mobility comments: Pt in hallway ambulating with PT on my arrival. Did demonstrate rolling, sidelying  to sit, and sit to sidelying independently.   Transfers Overall transfer level: Independent                    Balance Overall balance assessment: Independent                                         ADL either performed or assessed with clinical judgement   ADL Overall ADL's : Needs assistance/impaired Eating/Feeding: Independent   Grooming: Independent   Upper Body Bathing: Modified independent   Lower Body Bathing: Modified independent Lower Body Bathing Details (indicate cue type and reason): simulated; uses long handled sponge at home Upper Body Dressing : Minimal assistance Upper Body Dressing Details (indicate cue type and reason): Educated concerning strategies to adhere to cervical precautions.  Lower Body Dressing: Minimal assistance;Sit to/from stand Lower Body Dressing Details (indicate cue type and reason): Educated concerning use of reacher as well as methods to adhere to cervical precautions.  Toilet Transfer: Stage manager and Hygiene: Independent   Retail buyer: Psychologist, counselling;Independent;Ambulation Tub/Shower Transfer Details (indicate cue type and reason): simulated in room Functional mobility during ADLs: Independent General ADL Comments: Educated pt and husband concerning cervical precautions, brace wear, and compensatory ADL strategies in detail. Discussed methods for dressing (UB and LB), hair washing, bathing, showering, grooming, and toileting tasks. Pt verbalizes and demonstrates understanding.      Vision Patient Visual Report: No change from baseline Vision Assessment?: No apparent visual deficits     Perception     Praxis      Pertinent Vitals/Pain Pain Assessment: Faces Faces Pain Scale: Hurts a  little bit Pain Location: cervical region at incision Pain Descriptors / Indicators: Aching;Sore;Operative site guarding Pain Intervention(s): Monitored during session     Hand  Dominance Right   Extremity/Trunk Assessment Upper Extremity Assessment Upper Extremity Assessment: Overall WFL for tasks assessed   Lower Extremity Assessment Lower Extremity Assessment: Overall WFL for tasks assessed       Communication Communication Communication: No difficulties   Cognition Arousal/Alertness: Awake/alert Behavior During Therapy: WFL for tasks assessed/performed Overall Cognitive Status: Within Functional Limits for tasks assessed                                     General Comments  Pt's husband present and demonstrates the ability to provide all necessary assistance.     Exercises     Shoulder Instructions      Home Living Family/patient expects to be discharged to:: Private residence Living Arrangements: Spouse/significant other Available Help at Discharge: Family Type of Home: House Home Access: Stairs to enter Secretary/administratorntrance Stairs-Number of Steps: 6 Entrance Stairs-Rails: Left Home Layout: One level     Bathroom Shower/Tub: Producer, television/film/videoWalk-in shower   Bathroom Toilet: Standard Bathroom Accessibility: Yes   Home Equipment: Cane - single point;Walker - 2 wheels;Shower seat;Adaptive equipment Adaptive Equipment: Reacher;Sock aid;Long-handled sponge        Prior Functioning/Environment Level of Independence: Independent                 OT Problem List: Pain      OT Treatment/Interventions:      OT Goals(Current goals can be found in the care plan section) Acute Rehab OT Goals Patient Stated Goal: to go home OT Goal Formulation: With patient  OT Frequency:     Barriers to D/C:            Co-evaluation              AM-PAC PT "6 Clicks" Daily Activity     Outcome Measure Help from another person eating meals?: None Help from another person taking care of personal grooming?: None Help from another person toileting, which includes using toliet, bedpan, or urinal?: None Help from another person bathing (including  washing, rinsing, drying)?: A Little Help from another person to put on and taking off regular upper body clothing?: A Little Help from another person to put on and taking off regular lower body clothing?: A Little 6 Click Score: 21   End of Session Equipment Utilized During Treatment: Cervical collar(soft collar) Nurse Communication: Mobility status  Activity Tolerance: Patient tolerated treatment well Patient left: with call bell/phone within reach;in bed;Other (comment)(seated at EOB)  OT Visit Diagnosis: Pain;Other abnormalities of gait and mobility (R26.89) Pain - part of body: (cervical)                Time: [id number] OT Time Calculation (min): 28 min Charges:  OT General Charges $OT Visit: 1 Visit OT Evaluation $OT Eval Moderate Complexity: 1 Mod OT Treatments $Self Care/Home Management : 8-22 mins G-Codes:     Doristine Sectionharity A Jiraiya Mcewan, MS OTR/L  Pager: [phone]   Karlin Heilman A Nia Nathaniel 03/16/2018, 5:49 PM

## 2018-03-16 NOTE — Anesthesia Preprocedure Evaluation (Addendum)
Anesthesia Evaluation  Patient identified by MRN, date of birth, ID band Patient awake    Reviewed: Allergy & Precautions, NPO status , Patient's Chart, lab work & pertinent test results  Airway Mallampati: II  TM Distance: >3 FB Neck ROM: Full    Dental  (+) Dental Advisory Given, Partial Lower, Edentulous Upper   Pulmonary former smoker,    breath sounds clear to auscultation       Cardiovascular hypertension,  Rhythm:Regular Rate:Normal     Neuro/Psych    GI/Hepatic   Endo/Other    Renal/GU      Musculoskeletal   Abdominal   Peds  Hematology   Anesthesia Other Findings   Reproductive/Obstetrics                            Anesthesia Physical Anesthesia Plan  ASA: III  Anesthesia Plan: General   Post-op Pain Management:    Induction: Intravenous  PONV Risk Score and Plan: 1 and Ondansetron and Dexamethasone  Airway Management Planned: Oral ETT  Additional Equipment:   Intra-op Plan:   Post-operative Plan: Extubation in OR  Informed Consent: I have reviewed the patients History and Physical, chart, labs and discussed the procedure including the risks, benefits and alternatives for the proposed anesthesia with the patient or authorized representative who has indicated his/her understanding and acceptance.     Plan Discussed with: CRNA and Anesthesiologist  Anesthesia Plan Comments:         Anesthesia Quick Evaluation

## 2018-03-16 NOTE — Evaluation (Signed)
Physical Therapy Evaluation Patient Details Name: Katrina Poole MRN: [id number] DOB: 09/13/1946 Today's Date: 03/16/2018   History of Present Illness  Patient is a 72 yo female s/p Anterior cervical discectomies and fusion at C3-4, C4-5, C5-6, C6-7  Clinical Impression  Patient seen for mobility assessment s/p cervical spine surgery. Mobilizing well. Educated patient on precautions, mobility expectations, safety and car transfers. No further acute PT needs. Will sign off.     Follow Up Recommendations No PT follow up    Equipment Recommendations  None recommended by PT    Recommendations for Other Services       Precautions / Restrictions Precautions Precautions: Cervical Precaution Booklet Issued: Yes (comment) Precaution Comments: reviewed with patient Required Braces or Orthoses: Cervical Brace Cervical Brace: Soft collar Restrictions Weight Bearing Restrictions: No      Mobility  Bed Mobility               General bed mobility comments: received EOB  Transfers Overall transfer level: Independent                  Ambulation/Gait Ambulation/Gait assistance: Independent Ambulation Distance (Feet): 160 Feet Assistive device: None Gait Pattern/deviations: WFL(Within Functional Limits)   Gait velocity interpretation: 1.31 - 2.62 ft/sec, indicative of limited community ambulator General Gait Details: steady with ambulation  Stairs Stairs: Yes Stairs assistance: Supervision Stair Management: One rail Left Number of Stairs: 6 General stair comments: supervision with VCs for blind negotation  Wheelchair Mobility    Modified Rankin (Stroke Patients Only)       Balance Overall balance assessment: Independent                                           Pertinent Vitals/Pain Pain Assessment: Faces Faces Pain Scale: Hurts a little bit Pain Intervention(s): Monitored during session    Home Living Family/patient expects to  be discharged to:: Private residence Living Arrangements: Spouse/significant other Available Help at Discharge: Family Type of Home: House Home Access: Stairs to enter Entrance Stairs-Rails: Left Entrance Stairs-Number of Steps: 6 Home Layout: One level Home Equipment: Cane - single point;Walker - 2 wheels;Shower seat      Prior Function Level of Independence: Independent               Hand Dominance   Dominant Hand: Right    Extremity/Trunk Assessment   Upper Extremity Assessment Upper Extremity Assessment: Overall WFL for tasks assessed    Lower Extremity Assessment Lower Extremity Assessment: Overall WFL for tasks assessed       Communication   Communication: No difficulties  Cognition Arousal/Alertness: Awake/alert Behavior During Therapy: WFL for tasks assessed/performed Overall Cognitive Status: Within Functional Limits for tasks assessed                                        General Comments      Exercises     Assessment/Plan    PT Assessment Patent does not need any further PT services  PT Problem List         PT Treatment Interventions      PT Goals (Current goals can be found in the Care Plan section)  Acute Rehab PT Goals PT Goal Formulation: All assessment and education complete, DC therapy    Frequency  Barriers to discharge        Co-evaluation               AM-PAC PT "6 Clicks" Daily Activity  Outcome Measure Difficulty turning over in bed (including adjusting bedclothes, sheets and blankets)?: A Little Difficulty moving from lying on back to sitting on the side of the bed? : A Little Difficulty sitting down on and standing up from a chair with arms (e.g., wheelchair, bedside commode, etc,.)?: A Little Help needed moving to and from a bed to chair (including a wheelchair)?: None Help needed walking in hospital room?: None Help needed climbing 3-5 steps with a railing? : A Little 6 Click Score:  20    End of Session Equipment Utilized During Treatment: Cervical collar Activity Tolerance: Patient tolerated treatment well Patient left: (with OT therapist) Nurse Communication: Mobility status PT Visit Diagnosis: Other symptoms and signs involving the nervous system (R29.898)    Time: 1610-9604: 1601-1617 PT Time Calculation (min) (ACUTE ONLY): 16 min   Charges:   PT Evaluation $PT Eval Low Complexity: 1 Low     PT G Codes:        Charlotte Crumbevon Emmilynn Marut, PT DPT  Board Certified Neurologic Specialist [phone]   Fabio AsaDevon J Cash Meadow 03/16/2018, 4:26 PM

## 2018-03-16 NOTE — Op Note (Signed)
Preoperative diagnosis:  Postoperative diagnosis: Same  Procedure: Anterior cervical discectomies and fusion at C3-4, C4-5, C5-6, C6-7 utilizing the globus peek cages at work TPS coated packed with locally harvested autograft mixed with vivigen  #2 anterior cervical plating utilizing the Atlantis translational plate with 8 fixed angle 13 mm screws and 2 variable angle 13 mm screws  Surgeon: Jillyn HiddenGary Tiersa Dayley  Asst.: Verlin DikeKimberly Meyran  anesthesia: Gen.  EBL: Minimal  History of present illness: 72 year old female with neck pain and left greater right arm pain shoulder and down into her hand and fingers consistent with C4, C5, C6 and C7 radicular place. Workup revealed severe cervical stenosis flexion extension films showed instability with subluxations dynamic at C3-4 and C4-5. 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 I extensively went over the risks and benefits of that operation with her as well as perioperative course expectations of outcome and alternatives of surgery and she understood and agreed to proceed forward.  Operative procedure: Patient brought into the or was to send general anesthesia positioned supine the neck in slight extension in 5 pounds of distraction aggressive never smoked and draped in routine sterile fashion. Preoperative localizing appropriate level so a curvilinear incision was made just off the midline to the interbody the sternomastoid and superficial abscesses dissected divided longitudinally the avascular plane to sternomastoid and strap muscles was developed and the prevertebral fascia was dissected away with Kitners. Interoperative x-ray confirmed the location proper level so annulotomy's were made at all disc space levels disc spaces were then drilled down cleaned out with a high-speed drill capturing the bone shavings in a mucous trap. Anterior aspect of did not Leksell rongeur and 3 mm Kerrison  punch. First working at C3-4 under microscopic illumination under biting of the posterior annulus and the posterior last ligament allowed removal of a large posterior spur extending into the left C4 foramen. This is all removed decompress the central canal the left C4 nerve root in the right C4 nerve root was decompressed. This was then packed with Gelfoam after adequate decompression achieved attention taken the C4-5 and a second similar fashion C4-5 was mostly unstable level however this was cleaned out posterior longitudinally was removed some posterior spur was removed off the posterior aspect of the C4 and C5 vertebral bodies both C5 pedicles were identified both C5 nerve roots were decompressed. This was then also packed with Gelfoam to second C5-6. This discussed this and the markedly collapsed and large posterior spurs both centrally and foraminally. These were all removed in routine fashion the dura was identified marking laterally both C6 nerve roots skeletonized flush with pedicle. After adequate central and foraminal decompression was achieved this is packed with Gelfoam to second C6-7. C6-7 was also noted markedly collapsed large posterior spurs probably on the left were all removed in piecemeal fashion decompress the central canal and both C7 nerve roots. Then 6 mm peek cages that were packed with locally harvested autograft mixed with vivigen were inserted at C34 C5-6 and C6-7 with a 7 mm at C4-5. Then a 72 mm last translational plate was selected and this was a collapsed the bottom 2 dynamic components for adequate sizing inserted all screws with 2 variables and C4 all screws excellent purchase locking mechanisms were all engaged wounds and copiously irrigated meticulous hemostasis was maintained some additional bone graft was packed inside the plate posterior laterally to the peak cages. A drain was placed metallic loosing states was maintained  the wounds closed in layers with after Vicryl running 4  subcuticular Dermabond benzo and Steri-Strips and a sterile dressing. At the end the case all needle counts sponge counts were correct.

## 2018-03-16 NOTE — Transfer of Care (Signed)
Immediate Anesthesia Transfer of Care Note  Patient: Katrina Poole  Procedure(s) Performed: Anterior Cervical Discectomy Fusion - Cervical Three- Cervical Four, Cervical Four- Cervical Five, Cervical Five-Cervical Six, Cervical Six- Cerival Seven (N/A )  Patient Location: PACU  Anesthesia Type:General  Level of Consciousness: awake, alert  and oriented  Airway & Oxygen Therapy: Patient Spontanous Breathing and Patient connected to nasal cannula oxygen  Post-op Assessment: Report given to RN, Post -op Vital signs reviewed and stable and Patient moving all extremities  Post vital signs: Reviewed and stable  Last Vitals:  Vitals Value Taken Time  BP 127/84 03/16/2018 11:49 AM  Temp    Pulse 99 03/16/2018 11:49 AM  Resp 19 03/16/2018 11:49 AM  SpO2 95 % 03/16/2018 11:49 AM  Vitals shown include unvalidated device data.  Last Pain:  Vitals:   03/16/18 0737  TempSrc:   PainSc: 7       Patients Stated Pain Goal: 3 (03/16/18 0737)  Complications: No apparent anesthesia complications

## 2018-03-16 NOTE — H&P (Signed)
Katrina Poole is an 72 y.o. female.   Chief Complaint: neck and left greater than right shoulder and arm pain HPI: 72 year old female with long-standing neck and bilateral shoulder and arm pain worse on the left. Pain radiates into the top of her shoulder into her deltoid down her arm into her thumb and first 2 fingers. This does seem to be consistent with C4, C5, C6, and C7 radiculopathies. Workup has revealed severe cervical spondylosis stenosis and cord compression at C5-6 C6-7-1 large left-sided spur at C3-4 and instability with subluxation at C3-4 and C4-5 on flexion extension films. Due to patient's failure conservative treatment imaging findings and progression of clinical syndrome I recommended an ACDF at C3-4, C4-5, C5-6, and C6-7. I've extensively gone over the risks and benefits of the procedure with her as well as perioperative course expectations of outcome and alternatives of surgery and she understood and agreed to proceed forward.  Past Medical History:  Diagnosis Date  . Arthritis    osteo of left hip  . Asthma    humidity and exercise during summer  . GERD (gastroesophageal reflux disease)   . Hypertension    controlled on meds  . Meniere syndrome    hearing deaf right ear  . Neuromuscular disorder (HCC)    finger tips go numb, neck vertebrae  . Vertigo   . Wears dentures    full dentures on top, partial bottom    Past Surgical History:  Procedure Laterality Date  . CARDIAC CATHETERIZATION     x2 both normal  . CARPAL TUNNEL RELEASE    . COLONOSCOPY    . COLONOSCOPY WITH PROPOFOL N/A 06/04/2016   Procedure: COLONOSCOPY WITH PROPOFOL;  Surgeon: Christena DeemMartin U Skulskie, MD;  Location: [facility] ENDOSCOPY;  Service: Endoscopy;  Laterality: N/A;  . DILATION AND CURETTAGE OF UTERUS    . ECTOPIC PREGNANCY SURGERY    . ESOPHAGOGASTRODUODENOSCOPY (EGD) WITH PROPOFOL N/A 06/04/2016   Procedure: ESOPHAGOGASTRODUODENOSCOPY (EGD) WITH PROPOFOL;  Surgeon: Christena DeemMartin U Skulskie, MD;   Location: [facility] ENDOSCOPY;  Service: Endoscopy;  Laterality: N/A;  . exploratory laparoscopy     infertility surgery  . HALLUX VALGUS CORRECTION Bilateral   . JOINT REPLACEMENT Bilateral    partial knee  . KNEE ARTHROSCOPY Left 07/19/2015   Procedure: Arthroscopic limited synovectomy along with lateral compartment and retropatellar chondral debridement plus removal of several small loose bodies;  Surgeon: Erin SonsHarold Kernodle, MD;  Location: [facility];  Service: Orthopedics;  Laterality: Left;  1ST CASE PER CECE  . MEDIAL PARTIAL KNEE REPLACEMENT Bilateral   . SHOULDER SURGERY Right   . TOTAL HIP ARTHROPLASTY Left 11/25/2015   Procedure: TOTAL HIP ARTHROPLASTY;  Surgeon: Donato HeinzJames P Hooten, MD;  Location: [facility] ORS;  Service: Orthopedics;  Laterality: Left;  . TRIGGER FINGER RELEASE Bilateral     Family History  Problem Relation Age of Onset  . Heart failure Mother   . COPD Mother    Social History:  reports that she quit smoking about 27 years ago. Her smoking use included cigarettes. She has a 45.00 pack-year smoking history. She has never used smokeless tobacco. She reports that she drinks alcohol. She reports that she does not use drugs.  Allergies:  Allergies  Allergen Reactions  . Beef-Derived Products Other (See Comments)    Tears up my stomach.  . Sulfa Antibiotics Other (See Comments)    Hallucinations  . Zithromax [Azithromycin] Hives    Medications Prior to Admission  Medication Sig Dispense Refill  . acetaminophen (TYLENOL)  500 MG tablet Take 1,500 mg by mouth 3 (three) times daily as needed for moderate pain.    Marland Kitchen albuterol (PROVENTIL HFA;VENTOLIN HFA) 108 (90 Base) MCG/ACT inhaler Inhale 2 puffs into the lungs every 6 (six) hours as needed for wheezing or shortness of breath.    Marland Kitchen amLODipine (NORVASC) 5 MG tablet Take 5 mg by mouth every morning.     . clonazePAM (KLONOPIN) 0.5 MG tablet Take 0.5 mg by mouth at bedtime.    . fosinopril (MONOPRIL) 40 MG tablet Take 40  mg by mouth every morning.     . gabapentin (NEURONTIN) 300 MG capsule Take 600 mg by mouth at bedtime.     . hydrochlorothiazide (HYDRODIURIL) 25 MG tablet Take 25 mg by mouth every morning.     . meloxicam (MOBIC) 7.5 MG tablet Take 7.5 mg by mouth 2 (two) times daily.     . metoprolol succinate (TOPROL-XL) 50 MG 24 hr tablet Take 50 mg by mouth daily. Take with or immediately following a meal.    . Multiple Vitamin (MULTIVITAMIN) tablet Take 1 tablet by mouth every morning.     . Omega-3 Fatty Acids (FISH OIL PO) Take 1,040 mg by mouth every evening.    Marland Kitchen omeprazole (PRILOSEC) 20 MG capsule Take 20 mg by mouth 2 (two) times daily.     Marland Kitchen zolpidem (AMBIEN) 5 MG tablet Take 5 mg by mouth at bedtime.    Marland Kitchen alum hydroxide-mag trisilicate (GAVISCON) 80-20 MG CHEW chewable tablet Chew 1 tablet by mouth daily as needed for indigestion or heartburn.    . enoxaparin (LOVENOX) 40 MG/0.4ML injection Inject 0.4 mLs (40 mg total) into the skin daily. (Patient not taking: Reported on 03/08/2018) 14 Syringe 0  . mometasone (NASONEX) 50 MCG/ACT nasal spray Place 2 sprays into the nose daily. (Patient not taking: Reported on 03/08/2018) 17 g 0    No results found for this or any previous visit (from the past 48 hour(s)). No results found.  Review of Systems  Musculoskeletal: Positive for joint pain and neck pain.  Neurological: Positive for sensory change.    Blood pressure (!) 150/68, pulse 67, temperature 98.3 F (36.8 C), temperature source Oral, resp. rate 20, SpO2 96 %. Physical Exam  Constitutional: She is oriented to person, place, and time. She appears well-developed and well-nourished.  HENT:  Head: Normocephalic and atraumatic.  Eyes: Pupils are equal, round, and reactive to light.  Neck: Normal range of motion.  GI: Soft. Bowel sounds are normal.  Neurological: She is alert and oriented to person, place, and time. She has normal strength. GCS eye subscore is 4. GCS verbal subscore is 5. GCS  motor subscore is 6.  5 deltoid, bicep, tricep, wrist flexion, wrist extension, hand intrinsics.     Assessment/Plan 72 year old female presents for ACDF at C34, C4-5, C5-6, C6-7.  Doyle Tegethoff P, MD 03/16/2018, 7:33 AM

## 2018-03-16 NOTE — Anesthesia Procedure Notes (Signed)
Procedure Name: Intubation Date/Time: 03/16/2018 8:48 AM Performed by: Biddie Sebek T, CRNA Pre-anesthesia Checklist: Patient identified, Emergency Drugs available, Suction available and Patient being monitored Patient Re-evaluated:Patient Re-evaluated prior to induction Oxygen Delivery Method: Circle system utilized Preoxygenation: Pre-oxygenation with 100% oxygen Induction Type: IV induction Ventilation: Mask ventilation without difficulty Laryngoscope Size: Mac and 3 Grade View: Grade I Tube type: Oral Laser Tube: Cuffed inflated with minimal occlusive pressure - saline Tube size: 7.5 mm Number of attempts: 1 Airway Equipment and Method: Patient positioned with wedge pillow and Stylet Placement Confirmation: ETT inserted through vocal cords under direct vision,  positive ETCO2 and breath sounds checked- equal and bilateral Secured at: 21 cm Tube secured with: Tape Dental Injury: Teeth and Oropharynx as per pre-operative assessment

## 2018-03-16 NOTE — Anesthesia Postprocedure Evaluation (Signed)
Anesthesia Post Note  Patient: Katrina Poole  Procedure(s) Performed: Anterior Cervical Discectomy Fusion - Cervical Three- Cervical Four, Cervical Four- Cervical Five, Cervical Five-Cervical Six, Cervical Six- Cerival Seven (N/A )     Patient location during evaluation: PACU Anesthesia Type: General Level of consciousness: awake and alert Pain management: pain level controlled Vital Signs Assessment: post-procedure vital signs reviewed and stable Respiratory status: spontaneous breathing, nonlabored ventilation, respiratory function stable and patient connected to nasal cannula oxygen Cardiovascular status: blood pressure returned to baseline and stable Postop Assessment: no apparent nausea or vomiting Anesthetic complications: no    Last Vitals:  Vitals:   03/16/18 1319 03/16/18 1535  BP: 140/71 (!) 155/85  Pulse: 88 77  Resp: 18 16  Temp: (!) 36.4 C 36.6 C  SpO2: 95% 95%    Last Pain:  Vitals:   03/16/18 1535  TempSrc: Oral  PainSc:                  Philopater Mucha COKER

## 2018-03-17 ENCOUNTER — Encounter (HOSPITAL_COMMUNITY): Payer: Self-pay | Admitting: Neurosurgery

## 2018-03-17 MED ORDER — ACETAMINOPHEN-CODEINE #3 300-30 MG PO TABS: 2.0000 | ORAL_TABLET | Freq: Four times a day (QID) | ORAL | 0 refills | Status: DC | PRN

## 2018-03-17 MED FILL — Thrombin (Recombinant) For Soln 20000 Unit: CUTANEOUS | Qty: 1 | Status: AC

## 2018-03-17 NOTE — Progress Notes (Signed)
Patient alert and oriented, mae's well, voiding adequate amount of urine, swallowing without difficulty, no c/o pain at time of discharge. Patient discharged home with family. Script and discharged instructions given to patient. Patient and family stated understanding of instructions given. Patient has an appointment with Dr. Cram 

## 2018-03-17 NOTE — Discharge Instructions (Signed)

## 2018-03-17 NOTE — Discharge Summary (Signed)
Physician Discharge Summary  Patient ID: DENIM START MRN: [id number] DOB/AGE: April 18, 1946 72 y.o.  Admit date: 03/16/2018 Discharge date: 03/17/2018  Admission Diagnoses: cervical spondylosis   Discharge Diagnoses:same   Discharged Condition: good  Hospital Course: The patient was admitted on 03/16/2018 and taken to the operating room where the patient underwent acdf C3-7. The patient tolerated the procedure well and was taken to the recovery room and then to the floor in stable condition. The hospital course was routine. There were no complications. The wound remained clean dry and intact. Pt had appropriate neck soreness. No complaints of arm pain or new N/T/W. The patient remained afebrile with stable vital signs, and tolerated a regular diet. The patient continued to increase activities, and pain was well controlled with oral pain medications.   Consults: None  Significant Diagnostic Studies:  Results for orders placed or performed during the hospital encounter of 03/09/18  Surgical pcr screen  Result Value Ref Range   MRSA, PCR NEGATIVE NEGATIVE   Staphylococcus aureus NEGATIVE NEGATIVE  Basic metabolic panel  Result Value Ref Range   Sodium 132 (L) 135 - 145 mmol/L   Potassium 3.6 3.5 - 5.1 mmol/L   Chloride 97 (L) 101 - 111 mmol/L   CO2 24 22 - 32 mmol/L   Glucose, Bld 89 65 - 99 mg/dL   BUN 21 (H) 6 - 20 mg/dL   Creatinine, Ser 2.13 0.44 - 1.00 mg/dL   Calcium 9.2 8.9 - 08.6 mg/dL   GFR calc non Af Amer >60 >60 mL/min   GFR calc Af Amer >60 >60 mL/min   Anion gap 11 5 - 15  CBC  Result Value Ref Range   WBC 7.8 4.0 - 10.5 K/uL   RBC 4.05 3.87 - 5.11 MIL/uL   Hemoglobin 11.5 (L) 12.0 - 15.0 g/dL   HCT 57.8 (L) 46.9 - 62.9 %   MCV 87.2 78.0 - 100.0 fL   MCH 28.4 26.0 - 34.0 pg   MCHC 32.6 30.0 - 36.0 g/dL   RDW 52.8 41.3 - 24.4 %   Platelets 309 150 - 400 K/uL  Type and screen  Result Value Ref Range   ABO/RH(D) A POS    Antibody Screen NEG    Sample  Expiration      03/23/2018 Performed at [facility], [address], [place], [place] [address]   ABO/Rh  Result Value Ref Range   ABO/RH(D)      A POS Performed at [facility], [address] [place], [place] [address]     Dg Cervical Spine 1 View  Result Date: 03/16/2018 CLINICAL DATA:  Cervical fusion C3 through C7 EXAM: DG C-ARM 61-120 MIN; DG CERVICAL SPINE - 1 VIEW COMPARISON:  03/15/2017 FINDINGS: AP and lateral C-arm images were obtained in the operating room following cervical spine surgery. Anterior plate and screws extending from C3 to at least C6. C7 not adequately visualized for evaluation. Interbody spacers. Surgical sponges in the anterior soft tissues. IMPRESSION: ACDF beginning at C3. Lower margin of the fusion is not identified on this study. Follow-up radiographs suggested to confirm surgical levels. Electronically Signed   By: Marlan Palau M.D.   On: 03/16/2018 11:34   Dg C-arm 1-60 Min  Result Date: 03/16/2018 CLINICAL DATA:  Cervical fusion C3 through C7 EXAM: DG C-ARM 61-120 MIN; DG CERVICAL SPINE - 1 VIEW COMPARISON:  03/15/2017 FINDINGS: AP and lateral C-arm images were obtained in the operating room following cervical spine  surgery. Anterior plate and screws extending from C3 to at least C6. C7 not adequately visualized for evaluation. Interbody spacers. Surgical sponges in the anterior soft tissues. IMPRESSION: ACDF beginning at C3. Lower margin of the fusion is not identified on this study. Follow-up radiographs suggested to confirm surgical levels. Electronically Signed   By: Marlan Palau M.D.   On: 03/16/2018 11:34    Antibiotics:  Anti-infectives (From admission, onward)   Start     Dose/Rate Route Frequency Ordered Stop   03/16/18 1330  ceFAZolin (ANCEF) IVPB 2g/100 mL premix     2 g 200 mL/hr over 30 Minutes Intravenous Every 8 hours 03/16/18 1326 03/16/18 2131   03/16/18 0931  bacitracin 50,000 Units in sodium chloride 0.9 % 500 mL  irrigation  Status:  Discontinued       As needed 03/16/18 0931 03/16/18 1142   03/16/18 0730  ceFAZolin (ANCEF) IVPB 2g/100 mL premix     2 g 200 mL/hr over 30 Minutes Intravenous On call to O.R. 03/16/18 1610 03/16/18 0852      Discharge Exam: Blood pressure (!) 142/72, pulse 67, temperature 98.4 F (36.9 C), temperature source Oral, resp. rate 18, SpO2 99 %. Neurologic: Grossly normal ambualting and voiding well  Discharge Medications:   Allergies as of 03/17/2018      Reactions   Beef-derived Products Other (See Comments)   Tears up my stomach.   Sulfa Antibiotics Other (See Comments)   Hallucinations   Zithromax [azithromycin] Hives      Medication List    TAKE these medications   acetaminophen 500 MG tablet Commonly known as:  TYLENOL Take 1,500 mg by mouth 3 (three) times daily as needed for moderate pain.   acetaminophen-codeine 300-30 MG tablet Commonly known as:  TYLENOL #3 Take 2 tablets by mouth every 6 (six) hours as needed for moderate pain.   albuterol 108 (90 Base) MCG/ACT inhaler Commonly known as:  PROVENTIL HFA;VENTOLIN HFA Inhale 2 puffs into the lungs every 6 (six) hours as needed for wheezing or shortness of breath.   alum hydroxide-mag trisilicate 80-20 MG Chew chewable tablet Commonly known as:  GAVISCON Chew 1 tablet by mouth daily as needed for indigestion or heartburn.   amLODipine 5 MG tablet Commonly known as:  NORVASC Take 5 mg by mouth every morning.   clonazePAM 0.5 MG tablet Commonly known as:  KLONOPIN Take 0.5 mg by mouth at bedtime.   enoxaparin 40 MG/0.4ML injection Commonly known as:  LOVENOX Inject 0.4 mLs (40 mg total) into the skin daily.   FISH OIL PO Take 1,040 mg by mouth every evening.   fosinopril 40 MG tablet Commonly known as:  MONOPRIL Take 40 mg by mouth every morning.   gabapentin 300 MG capsule Commonly known as:  NEURONTIN Take 600 mg by mouth at bedtime.   hydrochlorothiazide 25 MG tablet Commonly  known as:  HYDRODIURIL Take 25 mg by mouth every morning.   meloxicam 7.5 MG tablet Commonly known as:  MOBIC Take 7.5 mg by mouth 2 (two) times daily.   metoprolol succinate 50 MG 24 hr tablet Commonly known as:  TOPROL-XL Take 50 mg by mouth daily. Take with or immediately following a meal.   mometasone 50 MCG/ACT nasal spray Commonly known as:  NASONEX Place 2 sprays into the nose daily.   multivitamin tablet Take 1 tablet by mouth every morning.   omeprazole 20 MG capsule Commonly known as:  PRILOSEC Take 20 mg by mouth 2 (two) times  daily.   zolpidem 5 MG tablet Commonly known as:  AMBIEN Take 5 mg by mouth at bedtime.       Disposition: home   Final Dx: acdf C3-7  Discharge Instructions     Remove dressing in 72 hours   Complete by:  As directed    Call MD for:  difficulty breathing, headache or visual disturbances   Complete by:  As directed    Call MD for:  extreme fatigue   Complete by:  As directed    Call MD for:  hives   Complete by:  As directed    Call MD for:  persistant dizziness or light-headedness   Complete by:  As directed    Call MD for:  persistant nausea and vomiting   Complete by:  As directed    Call MD for:  redness, tenderness, or signs of infection (pain, swelling, redness, odor or green/yellow discharge around incision site)   Complete by:  As directed    Call MD for:  severe uncontrolled pain   Complete by:  As directed    Call MD for:  temperature >100.4   Complete by:  As directed    Diet - low sodium heart healthy   Complete by:  As directed    Driving Restrictions   Complete by:  As directed    No driving 2 weeks   Increase activity slowly   Complete by:  As directed       Follow-up Information    Donalee Citrinram, Gary, MD. Schedule an appointment as soon as possible for a visit in 2 week(s).   Specialty:  Neurosurgery Contact information: [address] [place] [address] [phone]             Signed: Tiana LoftKimberly Hannah [facility] 03/17/2018, 10:14 AM

## 2018-08-06 ENCOUNTER — Other Ambulatory Visit: Payer: Self-pay

## 2018-08-06 ENCOUNTER — Ambulatory Visit
Admission: EM | Admit: 2018-08-06 | Discharge: 2018-08-06 | Disposition: A | Discharge status: Home / Self Care | Payer: Medicare Other | Attending: Family Medicine | Admitting: Family Medicine

## 2018-08-06 DIAGNOSIS — Y92512 Supermarket, store or market as the place of occurrence of the external cause: Secondary | ICD-10-CM

## 2018-08-06 DIAGNOSIS — W228XXA Striking against or struck by other objects, initial encounter: Secondary | ICD-10-CM

## 2018-08-06 DIAGNOSIS — R197 Diarrhea, unspecified: Secondary | ICD-10-CM | POA: Diagnosis not present

## 2018-08-06 DIAGNOSIS — R0781 Pleurodynia: Secondary | ICD-10-CM | POA: Diagnosis not present

## 2018-08-06 MED ORDER — BACLOFEN 10 MG PO TABS: 5.0000 mg | ORAL_TABLET | Freq: Three times a day (TID) | ORAL | 0 refills | Status: DC | PRN

## 2018-08-06 MED ORDER — DIPHENOXYLATE-ATROPINE 2.5-0.025 MG PO TABS: 1.0000 | ORAL_TABLET | Freq: Four times a day (QID) | ORAL | 0 refills | Status: DC | PRN

## 2018-08-06 NOTE — ED Triage Notes (Signed)
Pt states she was reaching in one of the containers at [facility] Thursday and felt something pop and is having continued pain around the left lateral rib since. Pt also c/o having diarrhea since Friday. Denies N/V.Katrina Poole

## 2018-08-06 NOTE — Discharge Instructions (Signed)
Rest.  Medications as prescribed.  Take care  Dr. Bralee Feldt  

## 2018-08-06 NOTE — ED Provider Notes (Signed)
[facility]    CSN: [id number] Arrival date & time: 08/06/18  1137  History   Chief Complaint Chief Complaint  Patient presents with  . Rib Injury   HPI  72 year old female presents with the above complaint. Also complains of stomach upset and diarrhea.   Patient reports that she was at [facility] on Thursday.  She reached in a "container" and subsequently hit the agent container with her left lower ribs.  Since that time she has had pain to the left lower rib area.  Worse with activity.  No bruising.  She has taken Tylenol without improvement.  Moderate in severity.  Additionally, patient reports that she has had diarrhea and upset stomach since Friday.  She states that it occurs during meals.  No vomiting.  No medications or interventions tried.  Has a history of constipation.  No other associated symptoms.  No other complaints.  PMH, Surgical Hx, Family Hx, Social History reviewed and updated as below.  Past Medical History:  Diagnosis Date  . Arthritis    osteo of left hip  . Asthma    humidity and exercise during summer  . GERD (gastroesophageal reflux disease)   . Hypertension    controlled on meds  . Meniere syndrome    hearing deaf right ear  . Neuromuscular disorder (HCC)    finger tips go numb, neck vertebrae  . Vertigo   . Wears dentures    full dentures on top, partial bottom   Patient Active Problem List   Diagnosis Date Noted  . 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 ANTERIOR CERVICAL DECOMP/DISCECTOMY FUSION N/A 03/16/2018   Procedure: Anterior Cervical Discectomy Fusion - Cervical Three- Cervical Four, Cervical Four- Cervical Five, Cervical Five-Cervical Six, Cervical Six- Cerival Seven;  Surgeon: Donalee Citrin, MD;  Location: [facility];  Service: Neurosurgery;  Laterality: N/A;  Anterior Cervical Discectomy Fusion - Cervical Three- Cervical Four, Cervical  Four- Cervical Five, Cervical Five-Cervical Six, Cervical Six- Ceriva  . CARDIAC CATHETERIZATION     x2 both normal  . CARPAL TUNNEL RELEASE    . COLONOSCOPY    . COLONOSCOPY WITH PROPOFOL N/A 06/04/2016   Procedure: COLONOSCOPY WITH PROPOFOL;  Surgeon: Christena Deem, MD;  Location: [facility] ENDOSCOPY;  Service: Endoscopy;  Laterality: N/A;  . DILATION AND CURETTAGE OF UTERUS    . ECTOPIC PREGNANCY SURGERY    . ESOPHAGOGASTRODUODENOSCOPY (EGD) WITH PROPOFOL N/A 06/04/2016   Procedure: ESOPHAGOGASTRODUODENOSCOPY (EGD) WITH PROPOFOL;  Surgeon: Christena Deem, MD;  Location: [facility] ENDOSCOPY;  Service: Endoscopy;  Laterality: N/A;  . exploratory laparoscopy     infertility surgery  . HALLUX VALGUS CORRECTION Bilateral   . JOINT REPLACEMENT Bilateral    partial knee  . KNEE ARTHROSCOPY Left 07/19/2015   Procedure: Arthroscopic limited synovectomy along with lateral compartment and retropatellar chondral debridement plus removal of several small loose bodies;  Surgeon: Erin Sons, MD;  Location: [facility];  Service: Orthopedics;  Laterality: Left;  1ST CASE PER CECE  . MEDIAL PARTIAL KNEE REPLACEMENT Bilateral   . SHOULDER SURGERY Right   . TOTAL HIP ARTHROPLASTY Left 11/25/2015   Procedure: TOTAL HIP ARTHROPLASTY;  Surgeon: Donato Heinz, MD;  Location: [facility] ORS;  Service: Orthopedics;  Laterality: Left;  . TRIGGER FINGER RELEASE Bilateral    OB History   None    Home Medications    Prior to Admission medications  Medication Sig Start Date End Date Taking? Authorizing Provider  acetaminophen (TYLENOL) 500 MG tablet Take 1,500 mg by mouth 3 (three) times daily as needed for moderate pain.    [provider]  albuterol (PROVENTIL HFA;VENTOLIN HFA) 108 (90 Base) MCG/ACT inhaler Inhale 2 puffs into the lungs every 6 (six) hours as needed for wheezing or shortness of breath.    [provider]  alum hydroxide-mag trisilicate (GAVISCON) 80-20 MG CHEW chewable  tablet Chew 1 tablet by mouth daily as needed for indigestion or heartburn.    [provider]  amLODipine (NORVASC) 5 MG tablet Take 5 mg by mouth every morning.     [provider]  baclofen (LIORESAL) 10 MG tablet Take 0.5-1 tablets (5-10 mg total) by mouth 3 (three) times daily as needed for muscle spasms (Rib pain). 08/06/18   Tommie Sams, DO  clonazePAM (KLONOPIN) 0.5 MG tablet Take 0.5 mg by mouth at bedtime.    [provider]  diphenoxylate-atropine (LOMOTIL) 2.5-0.025 MG tablet Take 1 tablet by mouth 4 (four) times daily as needed for diarrhea or loose stools. 08/06/18   Tommie Sams, DO  fosinopril (MONOPRIL) 40 MG tablet Take 40 mg by mouth every morning.     [provider]  gabapentin (NEURONTIN) 300 MG capsule Take 600 mg by mouth at bedtime.     [provider]  hydrochlorothiazide (HYDRODIURIL) 25 MG tablet Take 25 mg by mouth every morning.     [provider]  meloxicam (MOBIC) 7.5 MG tablet Take 7.5 mg by mouth 2 (two) times daily.     [provider]  metoprolol succinate (TOPROL-XL) 50 MG 24 hr tablet Take 50 mg by mouth daily. Take with or immediately following a meal.    [provider]  Multiple Vitamin (MULTIVITAMIN) tablet Take 1 tablet by mouth every morning.     [provider]  Omega-3 Fatty Acids (FISH OIL PO) Take 1,040 mg by mouth every evening.    [provider]  omeprazole (PRILOSEC) 20 MG capsule Take 20 mg by mouth 2 (two) times daily.     [provider]  zolpidem (AMBIEN) 5 MG tablet Take 5 mg by mouth at bedtime.    [provider]   Family History Family History  Problem Relation Age of Onset  . Heart failure Mother   . COPD Mother    Social History Social History   Tobacco Use  . Smoking status: Former Smoker    Packs/day: 1.50    Years: 30.00    Pack years: 45.00    Types: Cigarettes    Last attempt to quit: 11/12/1990    Years since  quitting: 27.7  . Smokeless tobacco: Never Used  Substance Use Topics  . Alcohol use: Yes    Comment: 2-3 / month  . Drug use: No   Allergies   Beef-derived products; Sulfa antibiotics; and Zithromax [azithromycin]   Review of Systems Review of Systems  Constitutional: Negative.   Gastrointestinal: Positive for diarrhea.       Upset stomach.   Musculoskeletal:       Rib pain.    Physical Exam Triage Vital Signs ED Triage Vitals  Enc Vitals Group     BP 08/06/18 1158 (!) 158/75     Pulse Rate 08/06/18 1158 (!) 59     Resp 08/06/18 1158 16     Temp 08/06/18 1158 97.6 F (36.4 C)     Temp Source 08/06/18 1158  Oral     SpO2 08/06/18 1158 100 %     Weight 08/06/18 1159 190 lb (86.2 kg)     Height 08/06/18 1159 5' (1.524 m)     Head Circumference --      Peak Flow --      Pain Score 08/06/18 1159 4     Pain Loc --      Pain Edu? --      Excl. in GC? --    Updated Vital Signs BP (!) 158/75 (BP Location: Left Arm)   Pulse (!) 59   Temp 97.6 F (36.4 C) (Oral)   Resp 16   Ht 5' (1.524 m)   Wt 86.2 kg   SpO2 100%   BMI 37.11 kg/m   Visual Acuity Right Eye Distance:   Left Eye Distance:   Bilateral Distance:    Right Eye Near:   Left Eye Near:    Bilateral Near:     Physical Exam  Constitutional: She is oriented to person, place, and time. She appears well-developed. No distress.  Cardiovascular: Normal rate and regular rhythm.  Pulmonary/Chest: Effort normal and breath sounds normal. She has no wheezes. She has no rales.  Abdominal: Soft. She exhibits no distension. There is no tenderness.  Musculoskeletal:  Left lower ribs - tender to palpation.   Neurological: She is alert and oriented to person, place, and time.  Psychiatric: She has a normal mood and affect. Her behavior is normal.  Nursing note and vitals reviewed.  UC Treatments / Results  Labs (all labs ordered are listed, but only abnormal results are displayed) Labs Reviewed - No data to  display  EKG None  Radiology No results found.  Procedures Procedures (including critical care time)  Medications Ordered in UC Medications - No data to display  Initial Impression / Assessment and Plan / UC Course  I have reviewed the triage vital signs and the nursing notes.  Pertinent labs & imaging results that were available during my care of the patient were reviewed by me and considered in my medical decision making (see chart for details).    72 year old female presents with rib pain and diarrhea. Pain is MSK. Treating with Baclofen. PRN Lomotil.   Final Clinical Impressions(s) / UC Diagnoses   Final diagnoses:  Rib pain  Diarrhea, unspecified type     Discharge Instructions     Rest.  Medications as prescribed.  Take care  Dr. Adriana Simas    ED Prescriptions    Medication Sig Dispense Auth. Provider   diphenoxylate-atropine (LOMOTIL) 2.5-0.025 MG tablet Take 1 tablet by mouth 4 (four) times daily as needed for diarrhea or loose stools. 30 tablet Santez Woodcox G, DO   baclofen (LIORESAL) 10 MG tablet Take 0.5-1 tablets (5-10 mg total) by mouth 3 (three) times daily as needed for muscle spasms (Rib pain). 30 each Tommie Sams, DO     Controlled Substance Prescriptions [place] Controlled Substance Registry consulted? Not Applicable   Tommie Sams, DO 08/06/18 1256

## 2019-01-21 ENCOUNTER — Emergency Department: Payer: Medicare Other

## 2019-01-21 ENCOUNTER — Other Ambulatory Visit: Payer: Self-pay

## 2019-01-21 ENCOUNTER — Emergency Department
Admission: EM | Admit: 2019-01-21 | Discharge: 2019-01-21 | Disposition: A | Discharge status: Home / Self Care | Payer: Medicare Other | Attending: Emergency Medicine | Admitting: Emergency Medicine

## 2019-01-21 ENCOUNTER — Encounter: Payer: Self-pay | Admitting: Emergency Medicine

## 2019-01-21 DIAGNOSIS — Z79899 Other long term (current) drug therapy: Secondary | ICD-10-CM | POA: Insufficient documentation

## 2019-01-21 DIAGNOSIS — Z96653 Presence of artificial knee joint, bilateral: Secondary | ICD-10-CM | POA: Insufficient documentation

## 2019-01-21 DIAGNOSIS — I10 Essential (primary) hypertension: Secondary | ICD-10-CM | POA: Diagnosis not present

## 2019-01-21 DIAGNOSIS — J45909 Unspecified asthma, uncomplicated: Secondary | ICD-10-CM | POA: Insufficient documentation

## 2019-01-21 DIAGNOSIS — Y92018 Other place in single-family (private) house as the place of occurrence of the external cause: Secondary | ICD-10-CM | POA: Insufficient documentation

## 2019-01-21 DIAGNOSIS — Z96641 Presence of right artificial hip joint: Secondary | ICD-10-CM | POA: Diagnosis not present

## 2019-01-21 DIAGNOSIS — S4992XA Unspecified injury of left shoulder and upper arm, initial encounter: Secondary | ICD-10-CM | POA: Diagnosis present

## 2019-01-21 DIAGNOSIS — Y999 Unspecified external cause status: Secondary | ICD-10-CM | POA: Insufficient documentation

## 2019-01-21 DIAGNOSIS — Y9389 Activity, other specified: Secondary | ICD-10-CM | POA: Diagnosis not present

## 2019-01-21 DIAGNOSIS — Z87891 Personal history of nicotine dependence: Secondary | ICD-10-CM | POA: Diagnosis not present

## 2019-01-21 DIAGNOSIS — W010XXA Fall on same level from slipping, tripping and stumbling without subsequent striking against object, initial encounter: Secondary | ICD-10-CM | POA: Insufficient documentation

## 2019-01-21 DIAGNOSIS — S42295A Other nondisplaced fracture of upper end of left humerus, initial encounter for closed fracture: Secondary | ICD-10-CM | POA: Insufficient documentation

## 2019-01-21 MED ORDER — OXYCODONE-ACETAMINOPHEN 5-325 MG PO TABS
1.0000 | ORAL_TABLET | Freq: Once | ORAL | Status: AC
  Administered 2019-01-21: 1 via ORAL
  Filled 2019-01-21: qty 1

## 2019-01-21 MED ORDER — OXYCODONE-ACETAMINOPHEN 5-325 MG PO TABS: 1.0000 | ORAL_TABLET | Freq: Four times a day (QID) | ORAL | 0 refills | Status: DC | PRN

## 2019-01-21 NOTE — ED Provider Notes (Signed)
     Emily Filbert, MD 01/21/19 [phone]
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
## 2019-01-21 NOTE — ED Provider Notes (Signed)
Patient seen by me, clinically suspected proximal humerus fracture.  X-ray confirmed the same.  We will discuss with orthopedics and place her in a shoulder immobilizer.   Emily Filbert, MD 01/21/19 [phone]

## 2019-01-21 NOTE — ED Triage Notes (Signed)
Pt tripped and fell today. Appears to have dislocation or left shoulder.  Pain from left shoulder to neck. Hit front of face as well.  Swelling and abrasion to nose.  No LOC. No blood thinners.  No cervical pain.

## 2019-01-21 NOTE — ED Notes (Signed)
Sling applied by Trenton Founds, NT. Pt tolerated well.

## 2019-01-22 NOTE — Consult Note (Signed)
Called by ER PA to regarding patient's left proximal humerus fracture s/p mechanical fall at home.  Patient is reported to have skin closed and is NVI.  I have reviewed the xrays.   I recommended left shoulder immobilizer or sling and follow up in the office later this week.

## 2019-05-03 LAB — HM DEXA SCAN: HM Dexa Scan: NORMAL

## 2019-06-10 ENCOUNTER — Encounter: Payer: Self-pay | Admitting: Emergency Medicine

## 2019-06-10 ENCOUNTER — Other Ambulatory Visit: Payer: Self-pay

## 2019-06-10 ENCOUNTER — Emergency Department: Payer: Medicare Other

## 2019-06-10 ENCOUNTER — Emergency Department
Admission: EM | Admit: 2019-06-10 | Discharge: 2019-06-10 | Disposition: A | Discharge status: Home / Self Care | Payer: Medicare Other | Attending: Emergency Medicine | Admitting: Emergency Medicine

## 2019-06-10 DIAGNOSIS — Z23 Encounter for immunization: Secondary | ICD-10-CM | POA: Insufficient documentation

## 2019-06-10 DIAGNOSIS — S61311A Laceration without foreign body of left index finger with damage to nail, initial encounter: Secondary | ICD-10-CM | POA: Insufficient documentation

## 2019-06-10 DIAGNOSIS — I10 Essential (primary) hypertension: Secondary | ICD-10-CM | POA: Insufficient documentation

## 2019-06-10 DIAGNOSIS — S61213A Laceration without foreign body of left middle finger without damage to nail, initial encounter: Secondary | ICD-10-CM | POA: Insufficient documentation

## 2019-06-10 DIAGNOSIS — Z87891 Personal history of nicotine dependence: Secondary | ICD-10-CM | POA: Insufficient documentation

## 2019-06-10 DIAGNOSIS — Y999 Unspecified external cause status: Secondary | ICD-10-CM | POA: Diagnosis not present

## 2019-06-10 DIAGNOSIS — Z79899 Other long term (current) drug therapy: Secondary | ICD-10-CM | POA: Diagnosis not present

## 2019-06-10 DIAGNOSIS — Y929 Unspecified place or not applicable: Secondary | ICD-10-CM | POA: Diagnosis not present

## 2019-06-10 DIAGNOSIS — W312XXA Contact with powered woodworking and forming machines, initial encounter: Secondary | ICD-10-CM | POA: Insufficient documentation

## 2019-06-10 DIAGNOSIS — S61209A Unspecified open wound of unspecified finger without damage to nail, initial encounter: Secondary | ICD-10-CM

## 2019-06-10 DIAGNOSIS — J45909 Unspecified asthma, uncomplicated: Secondary | ICD-10-CM | POA: Insufficient documentation

## 2019-06-10 DIAGNOSIS — Y9389 Activity, other specified: Secondary | ICD-10-CM | POA: Insufficient documentation

## 2019-06-10 DIAGNOSIS — T1490XA Injury, unspecified, initial encounter: Secondary | ICD-10-CM

## 2019-06-10 MED ORDER — CEPHALEXIN 500 MG PO CAPS: 500.0000 mg | ORAL_CAPSULE | Freq: Four times a day (QID) | ORAL | 0 refills | Status: AC

## 2019-06-10 MED ORDER — ACETAMINOPHEN-CODEINE #3 300-30 MG PO TABS
1.0000 | ORAL_TABLET | Freq: Once | ORAL | Status: AC
  Administered 2019-06-10: 1 via ORAL
  Filled 2019-06-10: qty 1

## 2019-06-10 MED ORDER — TETANUS-DIPHTH-ACELL PERTUSSIS 5-2.5-18.5 LF-MCG/0.5 IM SUSP
0.5000 mL | Freq: Once | INTRAMUSCULAR | Status: AC
  Administered 2019-06-10: 0.5 mL via INTRAMUSCULAR
  Filled 2019-06-10: qty 0.5

## 2019-06-10 MED ORDER — ACETAMINOPHEN-CODEINE #3 300-30 MG PO TABS: 1.0000 | ORAL_TABLET | Freq: Four times a day (QID) | ORAL | 0 refills | Status: AC | PRN

## 2019-06-10 MED ORDER — CEPHALEXIN 500 MG PO CAPS
500.0000 mg | ORAL_CAPSULE | Freq: Once | ORAL | Status: AC
  Administered 2019-06-10: 500 mg via ORAL
  Filled 2019-06-10: qty 1

## 2019-06-10 NOTE — Discharge Instructions (Addendum)
Please change dressing in 24 to 48 hours.  Please take antibiotics to prevent infection.  Return to the emergency department for any bleeding or symptoms concerning to you.  Please follow-up with Dr. Peggye Ley next week for wound management.

## 2019-06-10 NOTE — ED Provider Notes (Signed)
[facility] Emergency Department Provider Note  ____________________________________________  Time seen: Approximately 5:30 PM  I have reviewed the triage vital signs and the nursing notes.   HISTORY  Chief Complaint Laceration    HPI Katrina Poole is a 73 y.o. female that presents to the emergency department for evaluation of left index finger and middle finger lacerations.  Patient has piece of skin from her middle finger with her.  Patient cut her finger on a rotor.  Last tetanus shot was greater than 5 years ago.   Past Medical History:  Diagnosis Date  . Arthritis    osteo of left hip  . Asthma    humidity and exercise during summer  . GERD (gastroesophageal reflux disease)   . Hypertension    controlled on meds  . Meniere syndrome    hearing deaf right ear  . Neuromuscular disorder ([place])    finger tips go numb, neck vertebrae  . Vertigo   . Wears dentures    full dentures on top, partial bottom    Patient Active Problem List   Diagnosis Date Noted  . 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 ANTERIOR CERVICAL DECOMP/DISCECTOMY FUSION N/A 03/16/2018   Procedure: Anterior Cervical Discectomy Fusion - Cervical Three- Cervical Four, Cervical Four- Cervical Five, Cervical Five-Cervical Six, Cervical Six- Cerival Seven;  Surgeon: Kary Kos, MD;  Location: [place];  Service: Neurosurgery;  Laterality: N/A;  Anterior Cervical Discectomy Fusion - Cervical Three- Cervical Four, Cervical Four- Cervical Five, Cervical Five-Cervical Six, Cervical Six- Ceriva  . CARDIAC CATHETERIZATION     x2 both normal  . CARPAL TUNNEL RELEASE    . COLONOSCOPY    . COLONOSCOPY WITH PROPOFOL N/A 06/04/2016   Procedure: COLONOSCOPY WITH PROPOFOL;  Surgeon: Lollie Sails, MD;  Location: [facility] ENDOSCOPY;  Service: Endoscopy;  Laterality: N/A;  . DILATION AND CURETTAGE  OF UTERUS    . ECTOPIC PREGNANCY SURGERY    . ESOPHAGOGASTRODUODENOSCOPY (EGD) WITH PROPOFOL N/A 06/04/2016   Procedure: ESOPHAGOGASTRODUODENOSCOPY (EGD) WITH PROPOFOL;  Surgeon: Lollie Sails, MD;  Location: [facility] ENDOSCOPY;  Service: Endoscopy;  Laterality: N/A;  . exploratory laparoscopy     infertility surgery  . HALLUX VALGUS CORRECTION Bilateral   . JOINT REPLACEMENT Bilateral    partial knee  . KNEE ARTHROSCOPY Left 07/19/2015   Procedure: Arthroscopic limited synovectomy along with lateral compartment and retropatellar chondral debridement plus removal of several small loose bodies;  Surgeon: Leanor Kail, MD;  Location: [place];  Service: Orthopedics;  Laterality: Left;  1ST CASE PER CECE  . MEDIAL PARTIAL KNEE REPLACEMENT Bilateral   . SHOULDER SURGERY Right   . TOTAL HIP ARTHROPLASTY Left 11/25/2015   Procedure: TOTAL HIP ARTHROPLASTY;  Surgeon: Dereck Leep, MD;  Location: [facility] ORS;  Service: Orthopedics;  Laterality: Left;  . TRIGGER FINGER RELEASE Bilateral     Prior to Admission medications   Medication Sig Start Date End Date Taking? Authorizing Provider  acetaminophen (TYLENOL) 500 MG tablet Take 1,500 mg by mouth 3 (three) times daily as needed for moderate pain.    [provider]  albuterol (PROVENTIL HFA;VENTOLIN HFA) 108 (90 Base) MCG/ACT inhaler Inhale 2 puffs into the lungs every 6 (six) hours as needed for wheezing or shortness of breath.    [provider]  alum hydroxide-mag trisilicate (GAVISCON) 14-43 MG CHEW chewable tablet Chew 1 tablet by mouth  daily as needed for indigestion or heartburn.    [provider]  amLODipine (NORVASC) 5 MG tablet Take 5 mg by mouth every morning.     [provider]  baclofen (LIORESAL) 10 MG tablet Take 0.5-1 tablets (5-10 mg total) by mouth 3 (three) times daily as needed for muscle spasms (Rib pain). 08/06/18   Tommie Sams, DO  cephALEXin (KEFLEX) 500 MG capsule Take 1  capsule (500 mg total) by mouth 4 (four) times daily for 10 days. 06/10/19 06/20/19  Enid Derry, PA-C  clonazePAM (KLONOPIN) 0.5 MG tablet Take 0.5 mg by mouth at bedtime.    [provider]  diphenoxylate-atropine (LOMOTIL) 2.5-0.025 MG tablet Take 1 tablet by mouth 4 (four) times daily as needed for diarrhea or loose stools. 08/06/18   Tommie Sams, DO  fosinopril (MONOPRIL) 40 MG tablet Take 40 mg by mouth every morning.     [provider]  gabapentin (NEURONTIN) 300 MG capsule Take 600 mg by mouth at bedtime.     [provider]  hydrochlorothiazide (HYDRODIURIL) 25 MG tablet Take 25 mg by mouth every morning.     [provider]  meloxicam (MOBIC) 7.5 MG tablet Take 7.5 mg by mouth 2 (two) times daily.     [provider]  metoprolol succinate (TOPROL-XL) 50 MG 24 hr tablet Take 50 mg by mouth daily. Take with or immediately following a meal.    [provider]  Multiple Vitamin (MULTIVITAMIN) tablet Take 1 tablet by mouth every morning.     [provider]  Omega-3 Fatty Acids (FISH OIL PO) Take 1,040 mg by mouth every evening.    [provider]  omeprazole (PRILOSEC) 20 MG capsule Take 20 mg by mouth 2 (two) times daily.     [provider]  oxyCODONE-acetaminophen (PERCOCET/ROXICET) 5-325 MG tablet Take 1 tablet by mouth every 6 (six) hours as needed for severe pain. 01/21/19   Cuthriell, Delorise Royals, PA-C  zolpidem (AMBIEN) 5 MG tablet Take 5 mg by mouth at bedtime.    [provider]    Allergies Beef-derived products, Sulfa antibiotics, and Zithromax [azithromycin]  Family History  Problem Relation Age of Onset  . Heart failure Mother   . COPD Mother     Social History Social History   Tobacco Use  . Smoking status: Former Smoker    Packs/day: 1.50    Years: 30.00    Pack years: 45.00    Types: Cigarettes    Quit date: 11/12/1990    Years since quitting: 28.5  . Smokeless tobacco:  Never Used  Substance Use Topics  . Alcohol use: Yes    Comment: 2-3 / month  . Drug use: No     Review of Systems  Gastrointestinal: No abdominal pain.  No nausea, no vomiting.  Musculoskeletal: Positive for finger pain Skin: Negative for rash, ecchymosis.  Positive for laceration. Neurological: Negative for headaches.   ____________________________________________   PHYSICAL EXAM:  VITAL SIGNS: ED Triage Vitals  Enc Vitals Group     BP 06/10/19 1649 (!) 151/78     Pulse Rate 06/10/19 1649 70     Resp 06/10/19 1649 18     Temp 06/10/19 1649 98.2 F (36.8 C)     Temp Source 06/10/19 1649 Oral     SpO2 06/10/19 1649 98 %     Weight 06/10/19 1651 200 lb (90.7 kg)     Height 06/10/19 1651 4\' 11"  (1.499 m)  Head Circumference --      Peak Flow --      Pain Score 06/10/19 1650 5     Pain Loc --      Pain Edu? --      Excl. in [place]? --      Constitutional: Alert and oriented. Well appearing and in no acute distress. Eyes: Conjunctivae are normal. PERRL. EOMI. No discharge. Head: Atraumatic. ENT:       Ears:       Nose: No congestion/rhinnorhea.      Mouth/Throat: Mucous membranes are moist.  Neck: No stridor.   Hematological/Lymphatic/Immunilogical: No cervical lymphadenopathy. Cardiovascular: Normal rate, regular rhythm.  Good peripheral circulation. Respiratory: Normal respiratory effort without tachypnea or retractions. Lungs CTAB. Good air entry to the bases with no decreased or absent breath sounds. Musculoskeletal: Full range of motion to all extremities. No gross deformities appreciated.  2 cm avulsion to lateral index finger with avulsion of part of nail.  2 mm shave abrasion to lateral distal middle finger. Neurologic:  Normal speech and language. No gross focal neurologic deficits are appreciated.  Skin:  Skin is warm, dry. Psychiatric: Mood and affect are normal. Speech and behavior are normal. Patient exhibits appropriate insight and  judgement.   ____________________________________________   LABS (all labs ordered are listed, but only abnormal results are displayed)  Labs Reviewed - No data to display ____________________________________________  EKG   ____________________________________________  RADIOLOGY   Dg Hand Complete Left  Result Date: 06/10/2019 CLINICAL DATA:  Hand injury, cough tip of LEFT index finger with a rotary saw today EXAM: LEFT HAND - COMPLETE 3+ VIEW COMPARISON:  None FINDINGS: Dressing artifacts at index finger distally. Osseous demineralization. Joint space narrowing at STT joint. Joint spaces otherwise preserved. Chondrocalcinosis at carpus question CPPD. Soft tissue irregularity at tip of distal phalanx index finger. No fracture, dislocation, or bone destruction. IMPRESSION: Soft tissue injury to tip of distal phalanx LEFT index finger. No acute bony abnormalities. Osseous demineralization with minimal degenerative changes and question CPPD at carpus. Electronically Signed   By: Ulyses SouthwardMark  Boles M.D.   On: 06/10/2019 17:59    ____________________________________________    PROCEDURES  Procedure(s) performed:    Procedures    Medications  Tdap (BOOSTRIX) injection 0.5 mL (0.5 mLs Intramuscular Given 06/10/19 1802)  acetaminophen-codeine (TYLENOL #3) 300-30 MG per tablet 1 tablet (1 tablet Oral Given 06/10/19 1801)  cephALEXin (KEFLEX) capsule 500 mg (500 mg Oral Given 06/10/19 1948)     ____________________________________________   INITIAL IMPRESSION / ASSESSMENT AND PLAN / ED COURSE  Pertinent labs & imaging results that were available during my care of the patient were reviewed by me and considered in my medical decision making (see chart for details).  Review of the [place] CSRS was performed in accordance of the [facility] prior to dispensing any controlled drugs.   Patient presents emergency department for evaluation after finger injury. Vital signs and exam are reassuring.  X-ray  negative for bony abnormalities.  Patient has an avulsion to middle finger.  Patient did bring in a piece of skin from middle finger.  We discussed that there is no blood supply to this piece of skin and are in agreement to not attempt repair with excess skin piece.  Surgicel and pressure were used to control bleeding.  Fingers were bandaged.  Finger splint was placed to index finger. Patient feels comfortable going home. Patient will be discharged home with prescriptions for keflex. Patient is to follow up with hand  ortho as needed or otherwise directed. Referral was given to Dr. Stephenie AcresSoria. Patient is given ED precautions to return to the ED for any worsening or new symptoms.   Makinzi G Storer was evaluated in Emergency Department on 06/10/2019 for the symptoms described in the history of present illness. She was evaluated in the context of the global COVID-19 pandemic, which necessitated consideration that the patient might be at risk for infection with the SARS-CoV-2 virus that causes COVID-19. Institutional protocols and algorithms that pertain to the evaluation of patients at risk for COVID-19 are in a state of rapid change based on information released by regulatory bodies including the CDC and federal and state organizations. These policies and algorithms were followed during the patient's care in the ED.  ____________________________________________  FINAL CLINICAL IMPRESSION(S) / ED DIAGNOSES  Final diagnoses:  Avulsion of fingertip, initial encounter      NEW MEDICATIONS STARTED DURING THIS VISIT:  ED Discharge Orders         Ordered    cephALEXin (KEFLEX) 500 MG capsule  4 times daily     06/10/19 1941              This chart was dictated using voice recognition software/Dragon. Despite best efforts to proofread, errors can occur which can change the meaning. Any change was purely unintentional.    Enid DerryWagner, Jasen Hartstein, PA-C 06/10/19 2140    Phineas SemenGoodman, [place], MD 06/10/19  2249

## 2019-06-10 NOTE — ED Triage Notes (Signed)
Sliced tip of finger off approx 1545 today. Patient has avulsed piece on ice in cooler however is noted as only full thickness skin. Bleeding controlled.

## 2019-08-22 LAB — HM MAMMOGRAPHY

## 2019-09-03 ENCOUNTER — Emergency Department: Payer: Medicare Other

## 2019-09-03 ENCOUNTER — Encounter: Payer: Self-pay | Admitting: Intensive Care

## 2019-09-03 ENCOUNTER — Other Ambulatory Visit: Payer: Self-pay

## 2019-09-03 ENCOUNTER — Emergency Department
Admission: EM | Admit: 2019-09-03 | Discharge: 2019-09-03 | Disposition: A | Discharge status: Home / Self Care | Payer: Medicare Other | Attending: Emergency Medicine | Admitting: Emergency Medicine

## 2019-09-03 DIAGNOSIS — S0990XA Unspecified injury of head, initial encounter: Secondary | ICD-10-CM | POA: Diagnosis not present

## 2019-09-03 DIAGNOSIS — Y939 Activity, unspecified: Secondary | ICD-10-CM | POA: Insufficient documentation

## 2019-09-03 DIAGNOSIS — S0083XA Contusion of other part of head, initial encounter: Secondary | ICD-10-CM | POA: Insufficient documentation

## 2019-09-03 DIAGNOSIS — Z96642 Presence of left artificial hip joint: Secondary | ICD-10-CM | POA: Diagnosis not present

## 2019-09-03 DIAGNOSIS — R0781 Pleurodynia: Secondary | ICD-10-CM | POA: Insufficient documentation

## 2019-09-03 DIAGNOSIS — M542 Cervicalgia: Secondary | ICD-10-CM | POA: Diagnosis not present

## 2019-09-03 DIAGNOSIS — W010XXA Fall on same level from slipping, tripping and stumbling without subsequent striking against object, initial encounter: Secondary | ICD-10-CM | POA: Insufficient documentation

## 2019-09-03 DIAGNOSIS — Z96653 Presence of artificial knee joint, bilateral: Secondary | ICD-10-CM | POA: Diagnosis not present

## 2019-09-03 DIAGNOSIS — I10 Essential (primary) hypertension: Secondary | ICD-10-CM | POA: Diagnosis not present

## 2019-09-03 DIAGNOSIS — J45909 Unspecified asthma, uncomplicated: Secondary | ICD-10-CM | POA: Insufficient documentation

## 2019-09-03 DIAGNOSIS — Z87891 Personal history of nicotine dependence: Secondary | ICD-10-CM | POA: Insufficient documentation

## 2019-09-03 DIAGNOSIS — Z79899 Other long term (current) drug therapy: Secondary | ICD-10-CM | POA: Diagnosis not present

## 2019-09-03 DIAGNOSIS — T07XXXA Unspecified multiple injuries, initial encounter: Secondary | ICD-10-CM

## 2019-09-03 DIAGNOSIS — S0993XA Unspecified injury of face, initial encounter: Secondary | ICD-10-CM | POA: Diagnosis present

## 2019-09-03 DIAGNOSIS — W19XXXA Unspecified fall, initial encounter: Secondary | ICD-10-CM

## 2019-09-03 DIAGNOSIS — M25562 Pain in left knee: Secondary | ICD-10-CM | POA: Diagnosis not present

## 2019-09-03 DIAGNOSIS — S0012XA Contusion of left eyelid and periocular area, initial encounter: Secondary | ICD-10-CM | POA: Insufficient documentation

## 2019-09-03 DIAGNOSIS — Y999 Unspecified external cause status: Secondary | ICD-10-CM | POA: Insufficient documentation

## 2019-09-03 DIAGNOSIS — Y929 Unspecified place or not applicable: Secondary | ICD-10-CM | POA: Insufficient documentation

## 2019-09-03 MED ORDER — LIDOCAINE 5 % EX PTCH
1.0000 | MEDICATED_PATCH | CUTANEOUS | Status: DC
  Administered 2019-09-03: 13:00:00 1 via TRANSDERMAL

## 2019-09-03 NOTE — ED Provider Notes (Signed)
[facility] Emergency Department Provider Note  ____________________________________________   First MD Initiated Contact with Patient 09/03/19 1049     (approximate)  I have reviewed the triage vital signs and the nursing notes.   HISTORY  Chief Complaint Knee Pain (left)    HPI Katrina Poole is a 73 y.o. female presents emergency department after a fall.  She tripped over a bag and fell hitting her head on the rail and landing on her left knee.  She also had a fall earlier in the week where she fell directly on her chest.  She states her ribs hurt from that injury.  History of a total hip replacement and partial knee replacement on the left leg.  She denies any LOC, no nausea/vomiting.  No change in vision.   Patient is not on any anticoagulants   Past Medical History:  Diagnosis Date  . Arthritis    osteo of left hip  . Asthma    humidity and exercise during summer  . GERD (gastroesophageal reflux disease)   . Hypertension    controlled on meds  . Meniere syndrome    hearing deaf right ear  . Neuromuscular disorder ([facility])    finger tips go numb, neck vertebrae  . Vertigo   . Wears dentures    full dentures on top, partial bottom    Patient Active Problem List   Diagnosis Date Noted  . 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 ANTERIOR CERVICAL DECOMP/DISCECTOMY FUSION N/A 03/16/2018   Procedure: Anterior Cervical Discectomy Fusion - Cervical Three- Cervical Four, Cervical Four- Cervical Five, Cervical Five-Cervical Six, Cervical Six- Cerival Seven;  Surgeon: Donalee Citrinram, Gary, MD;  [facility];  Service: Neurosurgery;  Laterality: N/A;  Anterior Cervical Discectomy Fusion - Cervical Three- Cervical Four, Cervical Four- Cervical Five, Cervical Five-Cervical Six, Cervical Six- Ceriva  . CARDIAC CATHETERIZATION     x2 both normal  . CARPAL TUNNEL  RELEASE    . COLONOSCOPY    . COLONOSCOPY WITH PROPOFOL N/A 06/04/2016   Procedure: COLONOSCOPY WITH PROPOFOL;  Surgeon: Christena DeemMartin U Skulskie, MD;  Location[facility] ENDOSCOPY;  Service: Endoscopy;  Laterality: N/A;  . DILATION AND CURETTAGE OF UTERUS    . ECTOPIC PREGNANCY SURGERY    . ESOPHAGOGASTRODUODENOSCOPY (EGD) WITH PROPOFOL N/A 06/04/2016   Procedure: ESOPHAGOGASTRODUODENOSCOPY (EGD) WITH PROPOFOL;  Surgeon: Christena DeemMartin U Skulskie, MD;  [facility];  Service: Endoscopy;  Laterality: N/A;  . exploratory laparoscopy     infertility surgery  . HALLUX VALGUS CORRECTION Bilateral   . JOINT REPLACEMENT Bilateral    partial knee  . KNEE ARTHROSCOPY Left 07/19/2015   Procedure: Arthroscopic limited synovectomy along with lateral compartment and retropatellar chondral debridement plus removal of several small loose bodies;  Surgeon: Erin SonsHarold Kernodle, MD;  [facility];  Service: Orthopedics;  Laterality: Left;  1ST CASE PER CECE  . MEDIAL PARTIAL KNEE REPLACEMENT Bilateral   . SHOULDER SURGERY Right   . TOTAL HIP ARTHROPLASTY Left 11/25/2015   Procedure: TOTAL HIP ARTHROPLASTY;  Surgeon: Donato HeinzJames P Hooten, MD;  Location: [facility] ORS;  Service: Orthopedics;  Laterality: Left;  . TRIGGER FINGER RELEASE Bilateral     Prior to Admission medications   Medication Sig Start Date End Date Taking? Authorizing Provider  acetaminophen (TYLENOL) 500 MG tablet Take 1,500 mg by mouth 3 (three) times daily as needed for moderate pain.  [provider]  albuterol (PROVENTIL HFA;VENTOLIN HFA) 108 (90 Base) MCG/ACT inhaler Inhale 2 puffs into the lungs every 6 (six) hours as needed for wheezing or shortness of breath.    [provider]  alum hydroxide-mag trisilicate (GAVISCON) 80-20 MG CHEW chewable tablet Chew 1 tablet by mouth daily as needed for indigestion or heartburn.    [provider]  amLODipine (NORVASC) 5 MG tablet Take 5 mg by mouth every morning.      [provider]  baclofen (LIORESAL) 10 MG tablet Take 0.5-1 tablets (5-10 mg total) by mouth 3 (three) times daily as needed for muscle spasms (Rib pain). 08/06/18   Tommie Sams, DO  clonazePAM (KLONOPIN) 0.5 MG tablet Take 0.5 mg by mouth at bedtime.    [provider]  diphenoxylate-atropine (LOMOTIL) 2.5-0.025 MG tablet Take 1 tablet by mouth 4 (four) times daily as needed for diarrhea or loose stools. 08/06/18   Tommie Sams, DO  fosinopril (MONOPRIL) 40 MG tablet Take 40 mg by mouth every morning.     [provider]  gabapentin (NEURONTIN) 300 MG capsule Take 600 mg by mouth at bedtime.     [provider]  hydrochlorothiazide (HYDRODIURIL) 25 MG tablet Take 25 mg by mouth every morning.     [provider]  meloxicam (MOBIC) 7.5 MG tablet Take 7.5 mg by mouth 2 (two) times daily.     [provider]  metoprolol succinate (TOPROL-XL) 50 MG 24 hr tablet Take 50 mg by mouth daily. Take with or immediately following a meal.    [provider]  Multiple Vitamin (MULTIVITAMIN) tablet Take 1 tablet by mouth every morning.     [provider]  Omega-3 Fatty Acids (FISH OIL PO) Take 1,040 mg by mouth every evening.    [provider]  omeprazole (PRILOSEC) 20 MG capsule Take 20 mg by mouth 2 (two) times daily.     [provider]  oxyCODONE-acetaminophen (PERCOCET/ROXICET) 5-325 MG tablet Take 1 tablet by mouth every 6 (six) hours as needed for severe pain. 01/21/19   Cuthriell, Delorise Royals, PA-C  zolpidem (AMBIEN) 5 MG tablet Take 5 mg by mouth at bedtime.    [provider]    Allergies Beef-derived products, Sulfa antibiotics, and Zithromax [azithromycin]  Family History  Problem Relation Age of Onset  . Heart failure Mother   . COPD Mother     Social History Social History   Tobacco Use  . Smoking status: Former Smoker    Packs/day: 1.50    Years: 30.00    Pack years: 45.00    Types:  Cigarettes    Quit date: 11/12/1990    Years since quitting: 28.8  . Smokeless tobacco: Never Used  Substance Use Topics  . Alcohol use: Yes    Comment: 2-3 / month  . Drug use: No    Review of Systems  Constitutional: No fever/chills, positive head injury Eyes: No visual changes. ENT: No sore throat. Respiratory: Denies cough Genitourinary: Negative for dysuria. Musculoskeletal: Negative for back pain.  Positive for left knee, right ribs, C-spine type pain Skin: Negative for rash.    ____________________________________________   PHYSICAL EXAM:  VITAL SIGNS: ED Triage Vitals  Enc Vitals Group     BP 09/03/19 1040 (!) 173/75     Pulse Rate 09/03/19 1040 70     Resp 09/03/19 1040 16     Temp 09/03/19 1040 98.2 F (36.8 C)     Temp  Source 09/03/19 1040 Oral     SpO2 09/03/19 1040 98 %     Weight 09/03/19 1042 200 lb (90.7 kg)     Height 09/03/19 1042 4\' 11"  (1.499 m)     Head Circumference --      Peak Flow --      Pain Score 09/03/19 1042 5     Pain Loc --      Pain Edu? --      Excl. in [place]? --     Constitutional: Alert and oriented. Well appearing and in no acute distress. Eyes: Conjunctivae are normal.  Head: Large amount of swelling noted at the left brow and forehead Nose: No congestion/rhinnorhea. Mouth/Throat: Mucous membranes are moist.   Neck:  supple no lymphadenopathy noted Cardiovascular: Normal rate, regular rhythm. Heart sounds are normal Respiratory: Normal respiratory effort.  No retractions, lungs c t a  GU: deferred Musculoskeletal: FROM all extremities, warm and well perfused, left knee is tender, right ribs are tender, C-spine mildly tender Neurologic:  Normal speech and language.  Skin:  Skin is warm, dry and intact. No rash noted. Psychiatric: Mood and affect are normal. Speech and behavior are normal.  ____________________________________________   LABS (all labs ordered are listed, but only abnormal results are displayed)  Labs  Reviewed - No data to display ____________________________________________   ____________________________________________  RADIOLOGY  CT of the head and C-spine are negative X-ray of the right ribs, left femur and left tib-fib are negative  ____________________________________________   PROCEDURES  Procedure(s) performed: Knee immobilizer   Procedures    ____________________________________________   INITIAL IMPRESSION / ASSESSMENT AND PLAN / ED COURSE  Pertinent labs & imaging results that were available during my care of the patient were reviewed by me and considered in my medical decision making (see chart for details).   Patient 73 year old female presents emergency department after a fall.  See HPI  Physical exam shows patient appears well.  Contusion noted to the the left eye brow line, right rib is tender, left knee is tender  CT the head and C-spine are negative X-ray of the right rib, left tib-fib and left femur are negative  Explained all findings to the patient.  Tdap is up-to-date.  She was placed in a knee immobilizer.  She does have tramadol at home encouraged her to take this if needed.  Return emergency department worsening.  States she understands will comply.  She was discharged in stable condition.    Katrina Poole was evaluated in Emergency Department on 09/03/2019 for the symptoms described in the history of present illness. She was evaluated in the context of the global COVID-19 pandemic, which necessitated consideration that the patient might be at risk for infection with the SARS-CoV-2 virus that causes COVID-19. Institutional protocols and algorithms that pertain to the evaluation of patients at risk for COVID-19 are in a state of rapid change based on information released by regulatory bodies including the CDC and federal and state organizations. These policies and algorithms were followed during the patient's care in the ED.   As part of my  medical decision making, I reviewed the following data within the [place] notes reviewed and incorporated, Old chart reviewed, Radiograph reviewed , Notes from prior ED visits and [place] Controlled Substance Database  ____________________________________________   FINAL CLINICAL IMPRESSION(S) / ED DIAGNOSES  Final diagnoses:  Fall, initial encounter  Multiple contusions  Minor head injury, initial encounter      NEW  MEDICATIONS STARTED DURING THIS VISIT:  Discharge Medication List as of 09/03/2019 12:40 PM       Note:  This document was prepared using Dragon voice recognition software and may include unintentional dictation errors.    Faythe Ghee, PA-C 09/03/19 1501    Concha Se, MD 09/04/19 [phone]

## 2019-09-03 NOTE — Discharge Instructions (Signed)
Follow-up with your regular doctor if not improving in 3 to 5 days.  Or follow-up with your regular orthopedist.  You may also follow-up with Dr. Mack Guise who is on-call for orthopedics.  Apply ice to all areas that hurt.  Use your tramadol as needed for pain.  You may also take Tylenol and ibuprofen.

## 2019-09-03 NOTE — ED Triage Notes (Signed)
Patient tripped over bag today at home and hurt her left knee. Reports hitting forehead but no LOC. Also c/o left and right sided rib cage pain

## 2019-09-03 NOTE — ED Triage Notes (Signed)
First RN Note: Pt presents to ED via POV with c/o fall, pt ambulatory across the lobby without difficulty, c/o L knee pain and states hit her head. Pt A&O x 4. NAD noted at this time.

## 2019-09-03 NOTE — ED Notes (Signed)

## 2019-10-30 ENCOUNTER — Other Ambulatory Visit: Payer: Self-pay | Admitting: Neurology

## 2019-10-30 DIAGNOSIS — R42 Dizziness and giddiness: Secondary | ICD-10-CM

## 2019-11-09 ENCOUNTER — Ambulatory Visit: Payer: Medicare Other

## 2019-11-12 ENCOUNTER — Ambulatory Visit
Admission: RE | Admit: 2019-11-12 | Discharge: 2019-11-12 | Disposition: A | Discharge status: Home / Self Care | Payer: Medicare Other | Source: Ambulatory Visit | Attending: Neurology | Admitting: Neurology

## 2019-11-12 ENCOUNTER — Other Ambulatory Visit: Payer: Self-pay

## 2019-11-12 DIAGNOSIS — R42 Dizziness and giddiness: Secondary | ICD-10-CM | POA: Diagnosis present

## 2019-11-12 LAB — POCT I-STAT CREATININE: Creatinine, Ser: 0.8 mg/dL (ref 0.44–1.00)

## 2019-11-12 MED ORDER — GADOBUTROL 1 MMOL/ML IV SOLN
9.0000 mL | Freq: Once | INTRAVENOUS | Status: AC | PRN
  Administered 2019-11-12: 13:00:00 9 mL via INTRAVENOUS

## 2020-01-24 DIAGNOSIS — K219 Gastro-esophageal reflux disease without esophagitis: Secondary | ICD-10-CM | POA: Insufficient documentation

## 2020-03-12 ENCOUNTER — Other Ambulatory Visit: Payer: Self-pay | Admitting: Neurosurgery

## 2020-03-12 DIAGNOSIS — M5126 Other intervertebral disc displacement, lumbar region: Secondary | ICD-10-CM

## 2020-03-29 ENCOUNTER — Ambulatory Visit
Admission: RE | Admit: 2020-03-29 | Discharge: 2020-03-29 | Disposition: A | Discharge status: Home / Self Care | Payer: Medicare Other | Source: Ambulatory Visit | Attending: Neurosurgery | Admitting: Neurosurgery

## 2020-03-29 ENCOUNTER — Other Ambulatory Visit: Payer: Self-pay

## 2020-03-29 DIAGNOSIS — M5126 Other intervertebral disc displacement, lumbar region: Secondary | ICD-10-CM | POA: Diagnosis present

## 2020-04-01 LAB — LIPID PANEL
Cholesterol: 200 (ref 0–200)
HDL: 119 — AB (ref 35–70)
LDL Cholesterol: 119
Triglycerides: 200 — AB (ref 40–160)

## 2020-04-01 LAB — BASIC METABOLIC PANEL
BUN: 16 (ref 4–21)
CO2: 30 — AB (ref 13–22)
Chloride: 100 (ref 99–108)
Creatinine: 0.9 (ref 0.5–1.1)
Glucose: 92
Potassium: 4.2 (ref 3.4–5.3)
Sodium: 139 (ref 137–147)

## 2020-04-01 LAB — CBC AND DIFFERENTIAL
HCT: 37 (ref 36–46)
Hemoglobin: 12.3 (ref 12.0–16.0)
Platelets: 264 (ref 150–399)
WBC: 5.6

## 2020-04-01 LAB — COMPREHENSIVE METABOLIC PANEL: Calcium: 9.4 (ref 8.7–10.7)

## 2020-04-01 LAB — CBC: RBC: 4.03 (ref 3.87–5.11)

## 2020-04-02 LAB — BASIC METABOLIC PANEL
BUN: 16 (ref 4–21)
Creatinine: 0.9 (ref 0.5–1.1)
Glucose: 92
Potassium: 4.2 (ref 3.4–5.3)
Sodium: 139 (ref 137–147)

## 2020-04-02 LAB — LIPID PANEL
Cholesterol: 200 (ref 0–200)
HDL: 57 (ref 35–70)
LDL Cholesterol: 119
LDl/HDL Ratio: 119
Triglycerides: 119 (ref 40–160)

## 2020-04-02 LAB — COMPREHENSIVE METABOLIC PANEL
Calcium: 9.4 (ref 8.7–10.7)
GFR calc non Af Amer: 64

## 2020-04-02 LAB — CBC AND DIFFERENTIAL
HCT: 37 (ref 36–46)
Hemoglobin: 12.3 (ref 12.0–16.0)
Platelets: 264 (ref 150–399)
WBC: 5.6

## 2020-04-02 LAB — CBC: RBC: 4.03 (ref 3.87–5.11)

## 2020-04-04 ENCOUNTER — Other Ambulatory Visit: Payer: Self-pay | Admitting: Neurosurgery

## 2020-04-19 NOTE — Progress Notes (Signed)
Your procedure is scheduled on Monday July 26.  Report to [facility] Main Entrance "A" at 10:25 A.M., and check in at the Admitting office.  Call this number if you have problems the morning of surgery: [phone]  Call [phone] if you have any questions prior to your surgery date Monday-Friday 8am-4pm   Remember: Do not eat or drink after midnight the night before your surgery   Take these medicines the morning of surgery with A SIP OF WATER: amLODipine (NORVASC)  omeprazole (PRILOSEC)   If needed: albuterol (PROVENTIL HFA;VENTOLIN HFA) 108 (90 Base) --- Please bring all inhalers with you the day of surgery.  meclizine (ANTIVERT)  As of today, STOP taking any Aspirin (unless otherwise instructed by your surgeon), diclofenac Sodium (VOLTAREN) gel, meloxicam (MOBIC), Aleve, Naproxen, Ibuprofen, Motrin, Advil, Goody's, BC's, all herbal medications, fish oil, and all vitamins.    The Morning of Surgery  Do not wear jewelry, make-up or nail polish.  Do not wear lotions, powders, or perfumes, or deodorant  Do not shave 48 hours prior to surgery.   Do not bring valuables to the hospital.  [facility] is not responsible for any belongings or valuables.  If you are a smoker, DO NOT Smoke 24 hours prior to surgery  If you wear a CPAP at night please bring your mask the morning of surgery   Remember that you must have someone to transport you home after your surgery, and remain with you for 24 hours if you are discharged the same day.   Please bring cases for contacts, glasses, hearing aids, dentures or bridgework because it cannot be worn into surgery.    Leave your suitcase in the car.  After surgery it may be brought to your room.  For patients admitted to the hospital, discharge time will be determined by your treatment team.  Patients discharged the day of surgery will not be allowed to drive home.    Special instructions:   [facility]- Preparing For  Surgery  Before surgery, you can play an important role. Because skin is not sterile, your skin needs to be as free of germs as possible. You can reduce the number of germs on your skin by washing with CHG (chlorahexidine gluconate) Soap before surgery.  CHG is an antiseptic cleaner which kills germs and bonds with the skin to continue killing germs even after washing.    Oral Hygiene is also important to reduce your risk of infection.  Remember - BRUSH YOUR TEETH THE MORNING OF SURGERY WITH YOUR REGULAR TOOTHPASTE  Please do not use if you have an allergy to CHG or antibacterial soaps. If your skin becomes reddened/irritated stop using the CHG.  Do not shave (including legs and underarms) for at least 48 hours prior to first CHG shower. It is OK to shave your face.  Please follow these instructions carefully.   1. Shower the NIGHT BEFORE SURGERY and the MORNING OF SURGERY with CHG Soap.   2. If you chose to wash your hair and body, wash as usual with your normal shampoo and body-wash/soap.  3. Rinse your hair and body thoroughly to remove the shampoo and soap.  4. Apply CHG directly to the skin (ONLY FROM THE NECK DOWN) and wash gently with a scrungie or a clean washcloth.   5. Do not use on open wounds or open sores. Avoid contact with your eyes, ears, mouth and genitals (private parts). Wash Face and genitals (private parts)  with your normal  soap.   6. Wash thoroughly, paying special attention to the area where your surgery will be performed.  7. Thoroughly rinse your body with warm water from the neck down.  8. DO NOT shower/wash with your normal soap after using and rinsing off the CHG Soap.  9. Pat yourself dry with a CLEAN TOWEL.  10. Wear CLEAN PAJAMAS to bed the night before surgery  11. Place CLEAN SHEETS on your bed the night of your first shower and DO NOT SLEEP WITH PETS.  12. Wear comfortable clothes the morning of surgery.     Day of Surgery:  Please shower the  morning of surgery with the CHG soap Do not apply any deodorants/lotions. Please wear clean clothes to the hospital/surgery center.   Remember to brush your teeth WITH YOUR REGULAR TOOTHPASTE.   Please read over the following fact sheets that you were given.

## 2020-04-22 ENCOUNTER — Other Ambulatory Visit: Payer: Self-pay

## 2020-04-22 ENCOUNTER — Encounter (HOSPITAL_COMMUNITY)
Admission: RE | Admit: 2020-04-22 | Discharge: 2020-04-22 | Disposition: A | Discharge status: Home / Self Care | Payer: Medicare Other | Source: Ambulatory Visit | Attending: Neurosurgery | Admitting: Neurosurgery

## 2020-04-22 ENCOUNTER — Encounter (HOSPITAL_COMMUNITY): Payer: Self-pay

## 2020-04-22 DIAGNOSIS — G629 Polyneuropathy, unspecified: Secondary | ICD-10-CM | POA: Diagnosis not present

## 2020-04-22 DIAGNOSIS — Z01818 Encounter for other preprocedural examination: Secondary | ICD-10-CM | POA: Diagnosis present

## 2020-04-22 DIAGNOSIS — Z87891 Personal history of nicotine dependence: Secondary | ICD-10-CM | POA: Diagnosis not present

## 2020-04-22 DIAGNOSIS — I10 Essential (primary) hypertension: Secondary | ICD-10-CM | POA: Diagnosis not present

## 2020-04-22 DIAGNOSIS — Z6841 Body Mass Index (BMI) 40.0 and over, adult: Secondary | ICD-10-CM | POA: Insufficient documentation

## 2020-04-22 DIAGNOSIS — K219 Gastro-esophageal reflux disease without esophagitis: Secondary | ICD-10-CM | POA: Diagnosis not present

## 2020-04-22 DIAGNOSIS — Z96642 Presence of left artificial hip joint: Secondary | ICD-10-CM | POA: Diagnosis not present

## 2020-04-22 DIAGNOSIS — Z79899 Other long term (current) drug therapy: Secondary | ICD-10-CM | POA: Diagnosis not present

## 2020-04-22 LAB — BASIC METABOLIC PANEL
Anion gap: 10 (ref 5–15)
BUN: 25 mg/dL — ABNORMAL HIGH (ref 8–23)
CO2: 25 mmol/L (ref 22–32)
Calcium: 9.6 mg/dL (ref 8.9–10.3)
Chloride: 103 mmol/L (ref 98–111)
Creatinine, Ser: 0.95 mg/dL (ref 0.44–1.00)
GFR calc Af Amer: >60 mL/min (ref >60)
GFR calc non Af Amer: 60 mL/min — ABNORMAL LOW (ref >60)
Glucose, Bld: 94 mg/dL (ref 70–99)
Potassium: 3.9 mmol/L (ref 3.5–5.1)
Sodium: 138 mmol/L (ref 135–145)

## 2020-04-22 LAB — SURGICAL PCR SCREEN
MRSA, PCR: NEGATIVE
Staphylococcus aureus: NEGATIVE

## 2020-04-22 LAB — CBC
HCT: 37.5 % (ref 36.0–46.0)
Hemoglobin: 12.1 g/dL (ref 12.0–15.0)
MCH: 30.2 pg (ref 26.0–34.0)
MCHC: 32.3 g/dL (ref 30.0–36.0)
MCV: 93.5 fL (ref 80.0–100.0)
Platelets: 307 10*3/uL (ref 150–400)
RBC: 4.01 MIL/uL (ref 3.87–5.11)
RDW: 13.4 % (ref 11.5–15.5)
WBC: 5.9 10*3/uL (ref 4.0–10.5)
nRBC: 0 % (ref 0.0–0.2)

## 2020-04-22 NOTE — Progress Notes (Signed)
PCP - Dr. Acey Lav Cardiologist - denies  Chest x-ray - N/A EKG - 04/22/20 Stress Test - 10+ years ago  ECHO - 2009-[facility] everywhere Cardiac Cath - 2004-[facility]; report in Care Everywhere  Sleep Study - denies CPAP - denies  Blood Thinner Instructions:N/A Aspirin Instructions:N/A  ERAS Protcol -N/A PRE-SURGERY Ensure or G2-N/A   COVID TEST- pt will be tested DOS d/t traveling plans. Confirmed with Lindsi, Teacher, English as a foreign language. Pt will arrive by 0930 DOS.   Anesthesia review: No  Patient denies shortness of breath, fever, cough and chest pain at PAT appointment   All instructions explained to the patient, with a verbal understanding of the material. Patient agrees to go over the instructions while at home for a better understanding. Patient also instructed to self quarantine after being tested for COVID-19. The opportunity to ask questions was provided.   Coronavirus Screening  Have you experienced the following symptoms:  Cough yes/no: No Fever (>100.60F)  yes/no: No Runny nose yes/no: No Sore throat yes/no: No Difficulty breathing/shortness of breath  yes/no: No  Have you or a family member traveled in the last 14 days and where? yes/no: No   If the patient indicates "YES" to the above questions, their PAT will be rescheduled to limit the exposure to others and, the surgeon will be notified. THE PATIENT WILL NEED TO BE ASYMPTOMATIC FOR 14 DAYS.   If the patient is not experiencing any of these symptoms, the PAT nurse will instruct them to NOT bring anyone with them to their appointment since they may have these symptoms or traveled as well.   Please remind your patients and families that hospital visitation restrictions are in effect and the importance of the restrictions.

## 2020-04-22 NOTE — Progress Notes (Signed)
Your procedure is scheduled on Monday July 26.  Report to [facility] Main Entrance "A" at 10:25 A.M., and check in at the Admitting office.  Call this number if you have problems the morning of surgery: [phone]  Call [phone] if you have any questions prior to your surgery date Monday-Friday 8am-4pm   Remember: Do not eat or drink after midnight the night before your surgery   Take these medicines the morning of surgery with A SIP OF WATER: amLODipine (NORVASC)  omeprazole (PRILOSEC)  metoprolol succinate (TOPROL-XL)   If needed: albuterol (PROVENTIL HFA;VENTOLIN HFA) 108 (90 Base) --- Please bring all inhalers with you the day of surgery.  meclizine (ANTIVERT)  As of today, STOP taking any Aspirin (unless otherwise instructed by your surgeon), diclofenac Sodium (VOLTAREN) gel, meloxicam (MOBIC), Aleve, Naproxen, Ibuprofen, Motrin, Advil, Goody's, BC's, all herbal medications, fish oil, and all vitamins.    The Morning of Surgery  Do not wear jewelry, make-up or nail polish.  Do not wear lotions, powders, or perfumes, or deodorant  Do not shave 48 hours prior to surgery.   Do not bring valuables to the hospital.  [facility] is not responsible for any belongings or valuables.  If you are a smoker, DO NOT Smoke 24 hours prior to surgery  If you wear a CPAP at night please bring your mask the morning of surgery   Remember that you must have someone to transport you home after your surgery, and remain with you for 24 hours if you are discharged the same day.   Please bring cases for contacts, glasses, hearing aids, dentures or bridgework because it cannot be worn into surgery.    Leave your suitcase in the car.  After surgery it may be brought to your room.  For patients admitted to the hospital, discharge time will be determined by your treatment team.  Patients discharged the day of surgery will not be allowed to drive home.    Special instructions:   [facility]- Preparing For Surgery  Before surgery, you can play an important role. Because skin is not sterile, your skin needs to be as free of germs as possible. You can reduce the number of germs on your skin by washing with CHG (chlorahexidine gluconate) Soap before surgery.  CHG is an antiseptic cleaner which kills germs and bonds with the skin to continue killing germs even after washing.    Oral Hygiene is also important to reduce your risk of infection.  Remember - BRUSH YOUR TEETH THE MORNING OF SURGERY WITH YOUR REGULAR TOOTHPASTE  Please do not use if you have an allergy to CHG or antibacterial soaps. If your skin becomes reddened/irritated stop using the CHG.  Do not shave (including legs and underarms) for at least 48 hours prior to first CHG shower. It is OK to shave your face.  Please follow these instructions carefully.   1. Shower the NIGHT BEFORE SURGERY and the MORNING OF SURGERY with CHG Soap.   2. If you chose to wash your hair and body, wash as usual with your normal shampoo and body-wash/soap.  3. Rinse your hair and body thoroughly to remove the shampoo and soap.  4. Apply CHG directly to the skin (ONLY FROM THE NECK DOWN) and wash gently with a scrungie or a clean washcloth.   5. Do not use on open wounds or open sores. Avoid contact with your eyes, ears, mouth and genitals (private parts). Wash Face and genitals (private parts)  with your normal soap.   6. Wash thoroughly, paying special attention to the area where your surgery will be performed.  7. Thoroughly rinse your body with warm water from the neck down.  8. DO NOT shower/wash with your normal soap after using and rinsing off the CHG Soap.  9. Pat yourself dry with a CLEAN TOWEL.  10. Wear CLEAN PAJAMAS to bed the night before surgery  11. Place CLEAN SHEETS on your bed the night of your first shower and DO NOT SLEEP WITH PETS.  12. Wear comfortable clothes the morning of surgery.     Day of  Surgery:  Please shower the morning of surgery with the CHG soap Do not apply any deodorants/lotions. Please wear clean clothes to the hospital/surgery center.   Remember to brush your teeth WITH YOUR REGULAR TOOTHPASTE.   Please read over the following fact sheets that you were given.

## 2020-04-23 NOTE — Progress Notes (Signed)
Anesthesia Chart Review:  Case: [id number] Date/Time: 04/29/20 1212   Procedure: Microdiscectomy - L3-L4 - L4-L5 - right (Right Back) - 3C   Anesthesia type: General   Pre-op diagnosis: HNP   [facility] ROOM 19 / [facility] OR   Surgeons: Donalee Citrin, MD      DISCUSSION: Patient is a 74 year old female scheduled for the above procedure.  History includes former smoker, HTN, GERD, vertigo, Mnire syndrome (right), asthma, neuropathy, left THA (11/25/15), C3-7 ACDF (03/16/18). BMI is consistent with morbid obesity.  Due to travel, she is getting a presurgical COVID-19 test on the day of surgery. She denied chest pain, SOB, cough, fever at PAT RN visit. Preoperative labs acceptable for OR. EKG was stable showing SR, non-specific ST abnormality. Anesthesia team to evaluate on the day of surgery.   VS: BP (!) 147/80   Pulse 62   Temp 36.6 C (Oral)   Resp 20   Ht 5' (1.524 m)   Wt 98.6 kg   SpO2 97%   BMI 42.44 kg/m    PROVIDERS: Manor, Illene Labrador., MD is PCP [facility]) - Cristopher Peru, MD is neurologist [facility] Everywhere). Tele visit 02/28/20 for recurrent, intermittent vertigo. No Brain MRI findings to explain dizziness.  On gabapentin for RLS. - She is not followed routinely by cardiology, but was last evaluated in May 2009 by Cherlyn Labella, MD at [facility] [place] for family history of CAD and conflicting coronary angiograms approximately five years apart. She had a false positive stress test in 2004 with 50% mid LAD lesion and was treated medically. She had another cardiac cath after she developed chest pain after lifting several boxes in March 2009 and "The angiogram was done at [facility] and reviewed both films this morning that basically shows a 10% irregularity in the mid LAD with no significant blockage that could be seen. The angiogram revealed a normal left ventricular ejection fraction and the LDVEP was around 20."  Continued treatment for HLD by primary  care, weight loss, and exercise recommended. Echo ordered to evaluate for valvular disease and only showed trivial regurgitation.    LABS: Labs reviewed: Acceptable for surgery. (all labs ordered are listed, but only abnormal results are displayed)  Labs Reviewed  BASIC METABOLIC PANEL - Abnormal; Notable for the following components:      Result Value   BUN 25 (*)    GFR calc non Af Amer 60 (*)    All other components within normal limits  SURGICAL PCR SCREEN  CBC     IMAGES: MRI L-spine 03/29/20: IMPRESSION: - Multilevel degenerative change throughout the lumbar spine. Multi level spinal and subarticular stenosis as described above. 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 Small extruded disc fragment on the right extending cranial to the disc space. Severe subarticular stenosis bilaterally.  MRI Brain 11/12/19: IMPRESSION: 1. No specific explanation for dizziness. Negative for retrocochlear lesion. 2. Mild chronic small vessel ischemia in the white matter.   EKG: 04/22/20: Normal sinus rhythm Nonspecific ST abnormality Abnormal ECG No significant change since last tracing Confirmed by Arvilla Meres [phone]) on 04/22/2020 10:44:02 PM   CV: Echo 03/02/08 ([facility]): NORMAL LEFT VENTRICULAR SYSTOLIC FUNCTION  NORMAL RIGHT VENTRICULAR SYSTOLIC FUNCTION  VALVULAR REGURGITATION: TRIVIAL AR, TRIVIAL MR, TRIVIAL TR  NO VALVULAR STENOSIS  NO PRIOR STUDY FOR COMPARISON    According to 03/02/08 cardiology note by Fredirick Lathe, MD regarding cardiac cath ~ 12/2007 (see [facility] Care Everywhere):  "  Ms. Patton comes for a second opinion for two conflicting coronary angiograms done five years apart... [2004] angiogram which as per report done at [facility] showed a lesion of 55%. The patient was told that medical therapy was warranted at this time. The patient did well and never had any chest pain but in 2009 in March, precisely, after lifting lots  of boxes felt some strain in her chest and saw her primary care physician who immediately requested an angiogram. The angiogram was done at [facility] and reviewed both films this morning that basically shows a 10% irregularity in the mid LAD with no significant blockage that could be seen. The angiogram revealed a normal left ventricular ejection fraction and the LDVEP was around 20.   Essentially the patient is asymptomatic from a cardiac standpoint and she does not have any chest pain. Denies any shortness of breath other than when she exercises and walks up hills but overall nothing abnormal giving her condition. She does not have any palpitations or neurovascular symptoms. She denies any dyspnea or orthopnea. She does not get any leg swelling."  - Continued treatment for HLD by primary care, weight loss, and exercise recommended. Echo ordered to evaluate for valvular disease (see 03/02/08 echo above). - Comparison LHC 03/08/03 [facility] (done for chest pain with false positive stress test): mid LAD 50% stenosis at the origin of the second diagonal branch, otherwise only minor irregularities. Left dominant. LVEF 50-55% with subtle distal anterior wall hypokinesis of uncertain significance. Medical therapy recommended.    Past Medical History:  Diagnosis Date  . Arthritis    osteo of left hip  . Asthma    humidity and exercise during summer  . GERD (gastroesophageal reflux disease)   . Hypertension    controlled on meds  . Meniere syndrome    hearing deaf right ear  . Neuromuscular disorder (HCC)    finger tips go numb, neck vertebrae  . Vertigo   . Wears dentures    full dentures on top, partial bottom    Past Surgical History:  Procedure Laterality Date  . ANTERIOR CERVICAL DECOMP/DISCECTOMY FUSION N/A 03/16/2018   Procedure: Anterior Cervical Discectomy Fusion - Cervical Three- Cervical Four, Cervical Four- Cervical Five, Cervical Five-Cervical Six, Cervical Six- Cerival Seven;  Surgeon: Donalee Citrin, MD;  Location: [facility] OR;  Service: Neurosurgery;  Laterality: N/A;  Anterior Cervical Discectomy Fusion - Cervical Three- Cervical Four, Cervical Four- Cervical Five, Cervical Five-Cervical Six, Cervical Six- Ceriva  . CARDIAC CATHETERIZATION     x2 both normal  . CARPAL TUNNEL RELEASE    . COLONOSCOPY    . COLONOSCOPY WITH PROPOFOL N/A 06/04/2016   Procedure: COLONOSCOPY WITH PROPOFOL;  Surgeon: Christena Deem, MD;  Location: [facility] ENDOSCOPY;  Service: Endoscopy;  Laterality: N/A;  . DILATION AND CURETTAGE OF UTERUS    . ECTOPIC PREGNANCY SURGERY    . ESOPHAGOGASTRODUODENOSCOPY (EGD) WITH PROPOFOL N/A 06/04/2016   Procedure: ESOPHAGOGASTRODUODENOSCOPY (EGD) WITH PROPOFOL;  Surgeon: Christena Deem, MD;  Location: [facility] ENDOSCOPY;  Service: Endoscopy;  Laterality: N/A;  . exploratory laparoscopy     infertility surgery  . HALLUX VALGUS CORRECTION Bilateral   . JOINT REPLACEMENT Bilateral    partial knee  . KNEE ARTHROSCOPY Left 07/19/2015   Procedure: Arthroscopic limited synovectomy along with lateral compartment and retropatellar chondral debridement plus removal of several small loose bodies;  Surgeon: Erin Sons, MD;  Location: [facility];  Service: Orthopedics;  Laterality: Left;  1ST CASE PER CECE  .  MEDIAL PARTIAL KNEE REPLACEMENT Bilateral   . SHOULDER SURGERY Right   . TOTAL HIP ARTHROPLASTY Left 11/25/2015   Procedure: TOTAL HIP ARTHROPLASTY;  Surgeon: Donato Heinz, MD;  Location: [facility] ORS;  Service: Orthopedics;  Laterality: Left;  . TRIGGER FINGER RELEASE Bilateral     MEDICATIONS: . acetaminophen (TYLENOL) 500 MG tablet  . albuterol (PROVENTIL HFA;VENTOLIN HFA) 108 (90 Base) MCG/ACT inhaler  . alum hydroxide-mag trisilicate (GAVISCON) 80-20 MG CHEW chewable tablet  . amLODipine (NORVASC) 5 MG tablet  . baclofen (LIORESAL) 10 MG tablet  . cholecalciferol (VITAMIN D3) 25 MCG (1000 UNIT) tablet  . clonazePAM (KLONOPIN) 0.5 MG tablet  . diclofenac Sodium  (VOLTAREN) 1 % GEL  . diphenoxylate-atropine (LOMOTIL) 2.5-0.025 MG tablet  . fosinopril (MONOPRIL) 40 MG tablet  . gabapentin (NEURONTIN) 300 MG capsule  . hydrochlorothiazide (HYDRODIURIL) 25 MG tablet  . meclizine (ANTIVERT) 12.5 MG tablet  . meloxicam (MOBIC) 7.5 MG tablet  . metoprolol succinate (TOPROL-XL) 50 MG 24 hr tablet  . Multiple Vitamin (MULTIVITAMIN) tablet  . Omega-3 Fatty Acids (FISH OIL PO)  . omeprazole (PRILOSEC) 20 MG capsule  . oxyCODONE-acetaminophen (PERCOCET/ROXICET) 5-325 MG tablet  . zolpidem (AMBIEN) 5 MG tablet   No current facility-administered medications for this encounter.   She is not currently taking acetaminophen, Gaviscon, baclofen, Lomotil, fish oil, oxycodone/acetaminophen 5/325.   Shonna Chock, PA-C Surgical Short Stay/Anesthesiology [facility] Phone [phone] [facility] Phone [phone] 04/23/2020 4:02 PM

## 2020-04-23 NOTE — Anesthesia Preprocedure Evaluation (Addendum)
Anesthesia Evaluation  Patient identified by MRN, date of birth, ID band Patient awake    Reviewed: Allergy & Precautions, NPO status   Airway Mallampati: II  TM Distance: >3 FB     Dental   Pulmonary asthma , former smoker,    breath sounds clear to auscultation       Cardiovascular hypertension,  Rhythm:Regular Rate:Normal     Neuro/Psych  Neuromuscular disease    GI/Hepatic Neg liver ROS, GERD  ,  Endo/Other  negative endocrine ROS  Renal/GU negative Renal ROS     Musculoskeletal  (+) Arthritis ,   Abdominal   Peds  Hematology   Anesthesia Other Findings   Reproductive/Obstetrics                           Anesthesia Physical Anesthesia Plan  ASA: III  Anesthesia Plan: General   Post-op Pain Management:    Induction: Intravenous  PONV Risk Score and Plan: 3 and Dexamethasone, Midazolam and Ondansetron  Airway Management Planned: Oral ETT  Additional Equipment:   Intra-op Plan:   Post-operative Plan: Possible Post-op intubation/ventilation  Informed Consent: I have reviewed the patients History and Physical, chart, labs and discussed the procedure including the risks, benefits and alternatives for the proposed anesthesia with the patient or authorized representative who has indicated his/her understanding and acceptance.     Dental advisory given  Plan Discussed with: Anesthesiologist and CRNA  Anesthesia Plan Comments: (PAT note written by Shonna Chock, PA-C. )       Anesthesia Quick Evaluation

## 2020-04-29 ENCOUNTER — Encounter (HOSPITAL_COMMUNITY): Payer: Self-pay | Admitting: Neurosurgery

## 2020-04-29 ENCOUNTER — Inpatient Hospital Stay (HOSPITAL_COMMUNITY): Payer: Medicare Other | Admitting: Vascular Surgery

## 2020-04-29 ENCOUNTER — Inpatient Hospital Stay (HOSPITAL_COMMUNITY)
Admission: RE | Admit: 2020-04-29 | Discharge: 2020-04-29 | DRG: 519 | Disposition: A | Discharge status: Home / Self Care | Payer: Medicare Other | Attending: Neurosurgery | Admitting: Neurosurgery

## 2020-04-29 ENCOUNTER — Inpatient Hospital Stay (HOSPITAL_COMMUNITY): Payer: Medicare Other

## 2020-04-29 ENCOUNTER — Encounter (HOSPITAL_COMMUNITY)
Admission: RE | Disposition: A | Discharge status: Home / Self Care | Payer: Self-pay | Source: Home / Self Care | Attending: Neurosurgery

## 2020-04-29 ENCOUNTER — Inpatient Hospital Stay (HOSPITAL_COMMUNITY): Payer: Medicare Other | Admitting: Anesthesiology

## 2020-04-29 ENCOUNTER — Other Ambulatory Visit: Payer: Self-pay

## 2020-04-29 DIAGNOSIS — K219 Gastro-esophageal reflux disease without esophagitis: Secondary | ICD-10-CM | POA: Diagnosis present

## 2020-04-29 DIAGNOSIS — M5116 Intervertebral disc disorders with radiculopathy, lumbar region: Secondary | ICD-10-CM | POA: Diagnosis present

## 2020-04-29 DIAGNOSIS — Z791 Long term (current) use of non-steroidal anti-inflammatories (NSAID): Secondary | ICD-10-CM | POA: Diagnosis not present

## 2020-04-29 DIAGNOSIS — J45909 Unspecified asthma, uncomplicated: Secondary | ICD-10-CM | POA: Diagnosis present

## 2020-04-29 DIAGNOSIS — Z6841 Body Mass Index (BMI) 40.0 and over, adult: Secondary | ICD-10-CM

## 2020-04-29 DIAGNOSIS — Z96653 Presence of artificial knee joint, bilateral: Secondary | ICD-10-CM | POA: Diagnosis present

## 2020-04-29 DIAGNOSIS — Z20822 Contact with and (suspected) exposure to covid-19: Secondary | ICD-10-CM | POA: Diagnosis present

## 2020-04-29 DIAGNOSIS — I10 Essential (primary) hypertension: Secondary | ICD-10-CM | POA: Diagnosis present

## 2020-04-29 DIAGNOSIS — Z96642 Presence of left artificial hip joint: Secondary | ICD-10-CM | POA: Diagnosis present

## 2020-04-29 DIAGNOSIS — Z419 Encounter for procedure for purposes other than remedying health state, unspecified: Secondary | ICD-10-CM

## 2020-04-29 DIAGNOSIS — M5126 Other intervertebral disc displacement, lumbar region: Secondary | ICD-10-CM | POA: Diagnosis present

## 2020-04-29 DIAGNOSIS — Z87891 Personal history of nicotine dependence: Secondary | ICD-10-CM | POA: Diagnosis not present

## 2020-04-29 DIAGNOSIS — M48061 Spinal stenosis, lumbar region without neurogenic claudication: Secondary | ICD-10-CM | POA: Diagnosis present

## 2020-04-29 DIAGNOSIS — H8101 Meniere's disease, right ear: Secondary | ICD-10-CM | POA: Diagnosis present

## 2020-04-29 DIAGNOSIS — M4606 Spinal enthesopathy, lumbar region: Secondary | ICD-10-CM | POA: Diagnosis present

## 2020-04-29 DIAGNOSIS — M545 Low back pain: Secondary | ICD-10-CM | POA: Diagnosis present

## 2020-04-29 DIAGNOSIS — Z79899 Other long term (current) drug therapy: Secondary | ICD-10-CM | POA: Diagnosis not present

## 2020-04-29 HISTORY — PX: LUMBAR LAMINECTOMY/DECOMPRESSION MICRODISCECTOMY: SHX5026

## 2020-04-29 LAB — SARS CORONAVIRUS 2 BY RT PCR (HOSPITAL ORDER, PERFORMED IN ~~LOC~~ HOSPITAL LAB): SARS Coronavirus 2: NEGATIVE

## 2020-04-29 SURGERY — LUMBAR LAMINECTOMY/DECOMPRESSION MICRODISCECTOMY 2 LEVELS
Anesthesia: General | Site: Back | Laterality: Right

## 2020-04-29 MED ORDER — SODIUM CHLORIDE 0.9% FLUSH: 3.0000 mL | INTRAVENOUS | Status: DC | PRN

## 2020-04-29 MED ORDER — SUGAMMADEX SODIUM 200 MG/2ML IV SOLN
INTRAVENOUS | Status: DC | PRN
Start: 2020-04-29 — End: 2020-04-29
  Administered 2020-04-29: 200 mg via INTRAVENOUS

## 2020-04-29 MED ORDER — SODIUM CHLORIDE 0.9% FLUSH: 3.0000 mL | Freq: Two times a day (BID) | INTRAVENOUS | Status: DC

## 2020-04-29 MED ORDER — ONDANSETRON HCL 4 MG/2ML IJ SOLN
INTRAMUSCULAR | Status: AC
  Filled 2020-04-29: qty 2

## 2020-04-29 MED ORDER — ACETAMINOPHEN-CODEINE #3 300-30 MG PO TABS
1.0000 | ORAL_TABLET | ORAL | Status: DC | PRN
  Administered 2020-04-29: 1 via ORAL
  Filled 2020-04-29 (×2): qty 1

## 2020-04-29 MED ORDER — HYDROCODONE-ACETAMINOPHEN 5-325 MG PO TABS: 1.0000 | ORAL_TABLET | ORAL | Status: DC | PRN

## 2020-04-29 MED ORDER — SODIUM CHLORIDE 0.9 % IV SOLN
INTRAVENOUS | Status: DC | PRN
  Administered 2020-04-29: 500 mL

## 2020-04-29 MED ORDER — HYDROCHLOROTHIAZIDE 25 MG PO TABS: 25.0000 mg | ORAL_TABLET | ORAL | Status: DC

## 2020-04-29 MED ORDER — VITAMIN D 25 MCG (1000 UNIT) PO TABS: 1000.0000 [IU] | ORAL_TABLET | Freq: Every day | ORAL | Status: DC

## 2020-04-29 MED ORDER — CEFAZOLIN SODIUM-DEXTROSE 2-4 GM/100ML-% IV SOLN
2.0000 g | INTRAVENOUS | Status: AC
  Administered 2020-04-29: 2 g via INTRAVENOUS
  Filled 2020-04-29: qty 100

## 2020-04-29 MED ORDER — PHENOL 1.4 % MT LIQD: 1.0000 | OROMUCOSAL | Status: DC | PRN

## 2020-04-29 MED ORDER — FOSINOPRIL SODIUM 20 MG PO TABS: 40.0000 mg | ORAL_TABLET | ORAL | Status: DC

## 2020-04-29 MED ORDER — LIDOCAINE 2% (20 MG/ML) 5 ML SYRINGE
INTRAMUSCULAR | Status: AC
  Filled 2020-04-29: qty 5

## 2020-04-29 MED ORDER — FENTANYL CITRATE (PF) 100 MCG/2ML IJ SOLN
25.0000 ug | INTRAMUSCULAR | Status: DC | PRN
  Administered 2020-04-29: 50 ug via INTRAVENOUS

## 2020-04-29 MED ORDER — METOPROLOL SUCCINATE ER 50 MG PO TB24: 50.0000 mg | ORAL_TABLET | Freq: Every day | ORAL | Status: DC

## 2020-04-29 MED ORDER — DEXAMETHASONE SODIUM PHOSPHATE 10 MG/ML IJ SOLN
10.0000 mg | Freq: Once | INTRAMUSCULAR | Status: AC
  Administered 2020-04-29: 10 mg via INTRAVENOUS
  Filled 2020-04-29: qty 1

## 2020-04-29 MED ORDER — ALUM & MAG HYDROXIDE-SIMETH 200-200-20 MG/5ML PO SUSP: 30.0000 mL | Freq: Four times a day (QID) | ORAL | Status: DC | PRN

## 2020-04-29 MED ORDER — ONDANSETRON HCL 4 MG/2ML IJ SOLN: 4.0000 mg | Freq: Four times a day (QID) | INTRAMUSCULAR | Status: DC | PRN

## 2020-04-29 MED ORDER — MECLIZINE HCL 12.5 MG PO TABS
12.5000 mg | ORAL_TABLET | Freq: Three times a day (TID) | ORAL | Status: DC | PRN
  Filled 2020-04-29: qty 1

## 2020-04-29 MED ORDER — BUPIVACAINE HCL (PF) 0.25 % IJ SOLN
INTRAMUSCULAR | Status: AC
  Filled 2020-04-29: qty 30

## 2020-04-29 MED ORDER — LIDOCAINE-EPINEPHRINE 1 %-1:100000 IJ SOLN
INTRAMUSCULAR | Status: AC
  Filled 2020-04-29: qty 1

## 2020-04-29 MED ORDER — ONDANSETRON HCL 4 MG PO TABS: 4.0000 mg | ORAL_TABLET | Freq: Four times a day (QID) | ORAL | Status: DC | PRN

## 2020-04-29 MED ORDER — AMLODIPINE BESYLATE 5 MG PO TABS: 5.0000 mg | ORAL_TABLET | Freq: Every day | ORAL | Status: DC

## 2020-04-29 MED ORDER — GABAPENTIN 300 MG PO CAPS: 600.0000 mg | ORAL_CAPSULE | Freq: Every day | ORAL | Status: DC

## 2020-04-29 MED ORDER — CLONAZEPAM 0.5 MG PO TABS: 0.5000 mg | ORAL_TABLET | Freq: Every day | ORAL | Status: DC

## 2020-04-29 MED ORDER — FENTANYL CITRATE (PF) 250 MCG/5ML IJ SOLN
INTRAMUSCULAR | Status: AC
  Filled 2020-04-29: qty 5

## 2020-04-29 MED ORDER — CHLORHEXIDINE GLUCONATE 0.12 % MT SOLN
15.0000 mL | Freq: Once | OROMUCOSAL | Status: AC
  Administered 2020-04-29: 15 mL via OROMUCOSAL
  Filled 2020-04-29: qty 15

## 2020-04-29 MED ORDER — CEFAZOLIN SODIUM-DEXTROSE 2-4 GM/100ML-% IV SOLN: 2.0000 g | Freq: Three times a day (TID) | INTRAVENOUS | Status: DC

## 2020-04-29 MED ORDER — DEXAMETHASONE SODIUM PHOSPHATE 10 MG/ML IJ SOLN
INTRAMUSCULAR | Status: AC
  Filled 2020-04-29: qty 1

## 2020-04-29 MED ORDER — LIDOCAINE 2% (20 MG/ML) 5 ML SYRINGE
INTRAMUSCULAR | Status: DC | PRN
  Administered 2020-04-29: 100 mg via INTRAVENOUS

## 2020-04-29 MED ORDER — DICLOFENAC SODIUM 1 % EX GEL
2.0000 g | Freq: Three times a day (TID) | CUTANEOUS | Status: DC | PRN
  Filled 2020-04-29: qty 100

## 2020-04-29 MED ORDER — PANTOPRAZOLE SODIUM 40 MG PO TBEC: 40.0000 mg | DELAYED_RELEASE_TABLET | Freq: Every day | ORAL | Status: DC

## 2020-04-29 MED ORDER — PROPOFOL 10 MG/ML IV BOLUS
INTRAVENOUS | Status: AC
  Filled 2020-04-29: qty 20

## 2020-04-29 MED ORDER — ONDANSETRON HCL 4 MG/2ML IJ SOLN
INTRAMUSCULAR | Status: DC | PRN
  Administered 2020-04-29: 4 mg via INTRAVENOUS

## 2020-04-29 MED ORDER — ZOLPIDEM TARTRATE 5 MG PO TABS: 5.0000 mg | ORAL_TABLET | Freq: Every day | ORAL | Status: DC

## 2020-04-29 MED ORDER — CYCLOBENZAPRINE HCL 10 MG PO TABS: 10.0000 mg | ORAL_TABLET | Freq: Three times a day (TID) | ORAL | Status: DC | PRN

## 2020-04-29 MED ORDER — THROMBIN 5000 UNITS EX SOLR
CUTANEOUS | Status: DC | PRN
  Administered 2020-04-29 (×2): 5000 [IU] via TOPICAL

## 2020-04-29 MED ORDER — ACETAMINOPHEN 650 MG RE SUPP: 650.0000 mg | RECTAL | Status: DC | PRN

## 2020-04-29 MED ORDER — MELOXICAM 7.5 MG PO TABS
7.5000 mg | ORAL_TABLET | Freq: Two times a day (BID) | ORAL | Status: DC
  Filled 2020-04-29 (×2): qty 1

## 2020-04-29 MED ORDER — ALBUTEROL SULFATE (2.5 MG/3ML) 0.083% IN NEBU: 3.0000 mL | INHALATION_SOLUTION | Freq: Four times a day (QID) | RESPIRATORY_TRACT | Status: DC | PRN

## 2020-04-29 MED ORDER — MENTHOL 3 MG MT LOZG: 1.0000 | LOZENGE | OROMUCOSAL | Status: DC | PRN

## 2020-04-29 MED ORDER — FENTANYL CITRATE (PF) 100 MCG/2ML IJ SOLN
INTRAMUSCULAR | Status: AC
  Filled 2020-04-29: qty 2

## 2020-04-29 MED ORDER — FENTANYL CITRATE (PF) 100 MCG/2ML IJ SOLN
INTRAMUSCULAR | Status: DC | PRN
  Administered 2020-04-29: 100 ug via INTRAVENOUS
  Administered 2020-04-29: 50 ug via INTRAVENOUS
  Administered 2020-04-29: 25 ug via INTRAVENOUS

## 2020-04-29 MED ORDER — LACTATED RINGERS IV SOLN: INTRAVENOUS | Status: DC

## 2020-04-29 MED ORDER — ORAL CARE MOUTH RINSE: 15.0000 mL | Freq: Once | OROMUCOSAL | Status: AC

## 2020-04-29 MED ORDER — PROPOFOL 10 MG/ML IV BOLUS
INTRAVENOUS | Status: DC | PRN
  Administered 2020-04-29: 150 mg via INTRAVENOUS

## 2020-04-29 MED ORDER — PANTOPRAZOLE SODIUM 40 MG IV SOLR
40.0000 mg | Freq: Every day | INTRAVENOUS | Status: DC
Start: 2020-04-29 — End: 2020-04-29

## 2020-04-29 MED ORDER — ADULT MULTIVITAMIN W/MINERALS CH: 1.0000 | ORAL_TABLET | ORAL | Status: DC

## 2020-04-29 MED ORDER — ACETAMINOPHEN-CODEINE #3 300-30 MG PO TABS: 1.0000 | ORAL_TABLET | ORAL | 0 refills | Status: DC | PRN

## 2020-04-29 MED ORDER — CHLORHEXIDINE GLUCONATE CLOTH 2 % EX PADS: 6.0000 | MEDICATED_PAD | Freq: Once | CUTANEOUS | Status: DC

## 2020-04-29 MED ORDER — SODIUM CHLORIDE 0.9 % IV SOLN
250.0000 mL | INTRAVENOUS | Status: DC
  Administered 2020-04-29: 250 mL via INTRAVENOUS

## 2020-04-29 MED ORDER — BUPIVACAINE HCL (PF) 0.25 % IJ SOLN
INTRAMUSCULAR | Status: DC | PRN
  Administered 2020-04-29: 10 mL

## 2020-04-29 MED ORDER — ACETAMINOPHEN 325 MG PO TABS: 650.0000 mg | ORAL_TABLET | ORAL | Status: DC | PRN

## 2020-04-29 MED ORDER — 0.9 % SODIUM CHLORIDE (POUR BTL) OPTIME
TOPICAL | Status: DC | PRN
  Administered 2020-04-29: 1000 mL

## 2020-04-29 MED ORDER — HYDROMORPHONE HCL 1 MG/ML IJ SOLN: 0.5000 mg | INTRAMUSCULAR | Status: DC | PRN

## 2020-04-29 MED ORDER — PHENYLEPHRINE 40 MCG/ML (10ML) SYRINGE FOR IV PUSH (FOR BLOOD PRESSURE SUPPORT)
PREFILLED_SYRINGE | INTRAVENOUS | Status: DC | PRN
  Administered 2020-04-29 (×3): 80 ug via INTRAVENOUS

## 2020-04-29 MED ORDER — ROCURONIUM BROMIDE 10 MG/ML (PF) SYRINGE
PREFILLED_SYRINGE | INTRAVENOUS | Status: DC | PRN
  Administered 2020-04-29: 40 mg via INTRAVENOUS
  Administered 2020-04-29: 60 mg via INTRAVENOUS

## 2020-04-29 MED ORDER — THROMBIN 5000 UNITS EX SOLR
CUTANEOUS | Status: AC
  Filled 2020-04-29: qty 10000

## 2020-04-29 MED ORDER — HEMOSTATIC AGENTS (NO CHARGE) OPTIME
TOPICAL | Status: DC | PRN
  Administered 2020-04-29: 1

## 2020-04-29 MED ORDER — LIDOCAINE-EPINEPHRINE 1 %-1:100000 IJ SOLN
INTRAMUSCULAR | Status: DC | PRN
  Administered 2020-04-29: 10 mL

## 2020-04-29 MED ORDER — ROCURONIUM BROMIDE 10 MG/ML (PF) SYRINGE
PREFILLED_SYRINGE | INTRAVENOUS | Status: AC
  Filled 2020-04-29: qty 10

## 2020-04-29 SURGICAL SUPPLY — 51 items
BAG DECANTER FOR FLEXI CONT (MISCELLANEOUS) ×3 IMPLANT
BAND RUBBER #18 3X1/16 STRL (MISCELLANEOUS) ×6 IMPLANT
BENZOIN TINCTURE PRP APPL 2/3 (GAUZE/BANDAGES/DRESSINGS) ×3 IMPLANT
BLADE CLIPPER SURG (BLADE) IMPLANT
BLADE SURG 11 STRL SS (BLADE) ×3 IMPLANT
BUR CUTTER 7.0 ROUND (BURR) ×3 IMPLANT
BUR MATCHSTICK NEURO 3.0 LAGG (BURR) ×3 IMPLANT
CANISTER SUCT 3000ML PPV (MISCELLANEOUS) ×3 IMPLANT
CARTRIDGE OIL MAESTRO DRILL (MISCELLANEOUS) ×1 IMPLANT
CLOSURE WOUND 1/2 X4 (GAUZE/BANDAGES/DRESSINGS) ×1
COVER WAND RF STERILE (DRAPES) IMPLANT
DECANTER SPIKE VIAL GLASS SM (MISCELLANEOUS) ×3 IMPLANT
DERMABOND ADVANCED (GAUZE/BANDAGES/DRESSINGS) ×2
DERMABOND ADVANCED .7 DNX12 (GAUZE/BANDAGES/DRESSINGS) ×1 IMPLANT
DIFFUSER DRILL AIR PNEUMATIC (MISCELLANEOUS) ×3 IMPLANT
DRAPE HALF SHEET 40X57 (DRAPES) IMPLANT
DRAPE LAPAROTOMY 100X72X124 (DRAPES) ×3 IMPLANT
DRAPE MICROSCOPE LEICA (MISCELLANEOUS) ×3 IMPLANT
DRAPE SURG 17X23 STRL (DRAPES) ×3 IMPLANT
DRSG OPSITE POSTOP 4X6 (GAUZE/BANDAGES/DRESSINGS) ×3 IMPLANT
DURAPREP 26ML APPLICATOR (WOUND CARE) ×3 IMPLANT
ELECT REM PT RETURN 9FT ADLT (ELECTROSURGICAL) ×3
ELECTRODE REM PT RTRN 9FT ADLT (ELECTROSURGICAL) ×1 IMPLANT
GAUZE 4X4 16PLY RFD (DISPOSABLE) IMPLANT
GAUZE SPONGE 4X4 12PLY STRL (GAUZE/BANDAGES/DRESSINGS) ×3 IMPLANT
GLOVE BIO SURGEON STRL SZ7 (GLOVE) IMPLANT
GLOVE BIO SURGEON STRL SZ8 (GLOVE) ×3 IMPLANT
GLOVE BIOGEL PI IND STRL 7.0 (GLOVE) IMPLANT
GLOVE BIOGEL PI INDICATOR 7.0 (GLOVE)
GLOVE INDICATOR 8.5 STRL (GLOVE) ×3 IMPLANT
GOWN STRL REUS W/ TWL LRG LVL3 (GOWN DISPOSABLE) ×1 IMPLANT
GOWN STRL REUS W/ TWL XL LVL3 (GOWN DISPOSABLE) ×2 IMPLANT
GOWN STRL REUS W/TWL 2XL LVL3 (GOWN DISPOSABLE) IMPLANT
GOWN STRL REUS W/TWL LRG LVL3 (GOWN DISPOSABLE) ×2
GOWN STRL REUS W/TWL XL LVL3 (GOWN DISPOSABLE) ×4
KIT BASIN OR (CUSTOM PROCEDURE TRAY) ×3 IMPLANT
KIT TURNOVER KIT B (KITS) ×3 IMPLANT
NEEDLE HYPO 22GX1.5 SAFETY (NEEDLE) ×3 IMPLANT
NEEDLE SPNL 22GX3.5 QUINCKE BK (NEEDLE) ×3 IMPLANT
NS IRRIG 1000ML POUR BTL (IV SOLUTION) ×3 IMPLANT
OIL CARTRIDGE MAESTRO DRILL (MISCELLANEOUS) ×3
PACK LAMINECTOMY NEURO (CUSTOM PROCEDURE TRAY) ×3 IMPLANT
SPONGE SURGIFOAM ABS GEL SZ50 (HEMOSTASIS) ×3 IMPLANT
STRIP CLOSURE SKIN 1/2X4 (GAUZE/BANDAGES/DRESSINGS) ×2 IMPLANT
SUT VIC AB 0 CT1 18XCR BRD8 (SUTURE) ×1 IMPLANT
SUT VIC AB 0 CT1 8-18 (SUTURE) ×2
SUT VIC AB 2-0 CT1 18 (SUTURE) ×3 IMPLANT
SUT VICRYL 4-0 PS2 18IN ABS (SUTURE) ×3 IMPLANT
TOWEL GREEN STERILE (TOWEL DISPOSABLE) ×3 IMPLANT
TOWEL GREEN STERILE FF (TOWEL DISPOSABLE) ×3 IMPLANT
WATER STERILE IRR 1000ML POUR (IV SOLUTION) ×3 IMPLANT

## 2020-04-29 NOTE — Discharge Summary (Signed)
Physician Discharge Summary  Patient ID: Katrina Poole MRN: [id number] DOB/AGE: 1946/09/26 74 y.o. Estimated body mass index is 43.42 kg/m as calculated from the following:   Height as of this encounter: 4\' 11"  (1.499 m).   Weight as of this encounter: 97.5 kg.   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  Patient was discharged scheduled follow-up in 2 weeks  Consults: Significant Diagnostic Studies: Treatments: Lumbar microdiscectomy L3-4 L4-5 right Discharge Exam: Blood pressure (!) 144/67, pulse 70, temperature 98.2 F (36.8 C), temperature source Oral, resp. rate 18, height 4\' 11"  (1.499 m), weight (!) 97.5 kg, SpO2 96 %. Strength 5 out of 5 wound clean dry and intact  Disposition: Home   Allergies as of 04/29/2020      Reactions   Beef-derived Products Other (See Comments)   Tears up my stomach.   Oxycodone Other (See Comments)   Makes head foggy and interferes with Meniere's syndrome.     Sulfa Antibiotics Other (See Comments)   Hallucinations   Zithromax [azithromycin] Hives      Medication List    TAKE these medications   acetaminophen 500 MG tablet Commonly known as: TYLENOL Take 1,500 mg by mouth 3 (three) times daily as needed for moderate pain.   acetaminophen-codeine 300-30 MG tablet Commonly known as: TYLENOL #3 Take 1 tablet by mouth every 4 (four) hours as needed for moderate pain.   albuterol 108 (90 Base) MCG/ACT inhaler Commonly known  as: VENTOLIN HFA Inhale 2 puffs into the lungs every 6 (six) hours as needed for wheezing or shortness of breath.   alum hydroxide-mag trisilicate 80-20 MG Chew chewable tablet Commonly known as: GAVISCON Chew 1 tablet by mouth daily as needed for indigestion or heartburn.   amLODipine 5 MG tablet Commonly known as: NORVASC Take 5 mg by mouth every morning.   baclofen 10 MG tablet Commonly known as: LIORESAL Take 0.5-1 tablets (5-10 mg total) by mouth 3 (three) times daily as needed for muscle spasms (Rib pain).   cholecalciferol 25 MCG (1000 UNIT) tablet Commonly known as: VITAMIN D3 Take 1,000 Units by mouth at bedtime.   clonazePAM 0.5 MG tablet Commonly known as: KLONOPIN Take 0.5 mg by mouth at bedtime.   diphenoxylate-atropine 2.5-0.025 MG tablet Commonly known as: LOMOTIL Take 1 tablet by mouth 4 (four) times daily as needed for diarrhea or loose stools.   FISH OIL PO Take 1,040 mg by mouth every evening.   fosinopril 40 MG tablet Commonly known as: MONOPRIL Take 40 mg by mouth every morning.   gabapentin 300 MG capsule Commonly known as: NEURONTIN Take 600 mg by mouth at bedtime.   hydrochlorothiazide 25 MG tablet Commonly known as: HYDRODIURIL Take 25 mg by mouth every morning.   meclizine 12.5 MG tablet Commonly known as: ANTIVERT Take 12.5 mg by mouth 3 (three) times daily as needed for dizziness.   meloxicam 7.5 MG tablet Commonly known as: MOBIC Take 7.5 mg by mouth 2 (two) times daily.   metoprolol succinate 50 MG 24 hr tablet Commonly known as: TOPROL-XL Take 50  mg by mouth daily. Take with or immediately following a meal.   multivitamin tablet Take 1 tablet by mouth every morning.   omeprazole 20 MG capsule Commonly known as: PRILOSEC Take 20 mg by mouth 2 (two) times daily.   oxyCODONE-acetaminophen 5-325 MG tablet Commonly known as: PERCOCET/ROXICET Take 1 tablet by mouth every 6 (six) hours as needed for severe pain.   Voltaren 1 %  Gel Generic drug: diclofenac Sodium Apply 2 g topically 3 (three) times daily as needed (pain).   zolpidem 5 MG tablet Commonly known as: AMBIEN Take 5 mg by mouth at bedtime.        Signed: Mariam Dollar 04/29/2020, 6:08 PM

## 2020-04-29 NOTE — Anesthesia Postprocedure Evaluation (Signed)
Anesthesia Post Note  Patient: Katrina Poole  Procedure(s) Performed: Microdiscectomy - Lumbar Three-Lumbar Four - Lumbar Four-Lumbar Five - right (Right Back)     Patient location during evaluation: PACU Anesthesia Type: General Level of consciousness: awake Pain management: pain level controlled Vital Signs Assessment: post-procedure vital signs reviewed and stable Respiratory status: spontaneous breathing Cardiovascular status: stable Postop Assessment: no apparent nausea or vomiting Anesthetic complications: no   No complications documented.  Last Vitals:  Vitals:   04/29/20 1600 04/29/20 1639  BP: (!) 134/65 (!) 144/67  Pulse: 76 70  Resp: 12 18  Temp: 36.7 C 36.8 C  SpO2: 98% 96%    Last Pain:  Vitals:   04/29/20 1639  TempSrc: Oral  PainSc:                  Renezmae Canlas

## 2020-04-29 NOTE — Discharge Instructions (Signed)

## 2020-04-29 NOTE — H&P (Signed)
Katrina Poole is an 74 y.o. female.   Chief Complaint: Back and right leg pain HPI: 74 year old female with back and right leg pain rating down L4 and L5 nerve root pattern work-up revealed severe spinal stenosis at L3-4 and L4-5 with a disc herniation behind the L4 vertebral body looks like it migrated down from L3-4.  Due to patient failed conservative treatment imaging findings and progression of clinical syndrome I recommended two-level microdiscectomy at L3-4 and L4-5 on the right I have extensively gone over the risks and benefits of that operation with her as well as perioperative course expectations of outcome and alternatives of surgery and she understands and agrees to proceed forward.  Past Medical History:  Diagnosis Date  . Arthritis    osteo of left hip  . Asthma    humidity and exercise during summer  . GERD (gastroesophageal reflux disease)   . Hypertension    controlled on meds  . Meniere syndrome    hearing deaf right ear  . Neuromuscular disorder (HCC)    finger tips go numb, neck vertebrae  . Vertigo   . Wears dentures    full dentures on top, partial bottom    Past Surgical History:  Procedure Laterality Date  . ANTERIOR CERVICAL DECOMP/DISCECTOMY FUSION N/A 03/16/2018   Procedure: Anterior Cervical Discectomy Fusion - Cervical Three- Cervical Four, Cervical Four- Cervical Five, Cervical Five-Cervical Six, Cervical Six- Cerival Seven;  Surgeon: Donalee Citrin, MD;  Location: [facility] OR;  Service: Neurosurgery;  Laterality: N/A;  Anterior Cervical Discectomy Fusion - Cervical Three- Cervical Four, Cervical Four- Cervical Five, Cervical Five-Cervical Six, Cervical Six- Ceriva  . CARDIAC CATHETERIZATION     x2 both normal  . CARPAL TUNNEL RELEASE    . COLONOSCOPY    . COLONOSCOPY WITH PROPOFOL N/A 06/04/2016   Procedure: COLONOSCOPY WITH PROPOFOL;  Surgeon: Christena Deem, MD;  Location: [facility] ENDOSCOPY;  Service: Endoscopy;  Laterality: N/A;  . DILATION AND CURETTAGE OF  UTERUS    . ECTOPIC PREGNANCY SURGERY    . ESOPHAGOGASTRODUODENOSCOPY (EGD) WITH PROPOFOL N/A 06/04/2016   Procedure: ESOPHAGOGASTRODUODENOSCOPY (EGD) WITH PROPOFOL;  Surgeon: Christena Deem, MD;  Location: [facility] ENDOSCOPY;  Service: Endoscopy;  Laterality: N/A;  . exploratory laparoscopy     infertility surgery  . HALLUX VALGUS CORRECTION Bilateral   . JOINT REPLACEMENT Bilateral    partial knee  . KNEE ARTHROSCOPY Left 07/19/2015   Procedure: Arthroscopic limited synovectomy along with lateral compartment and retropatellar chondral debridement plus removal of several small loose bodies;  Surgeon: Erin Sons, MD;  Location: [facility];  Service: Orthopedics;  Laterality: Left;  1ST CASE PER CECE  . MEDIAL PARTIAL KNEE REPLACEMENT Bilateral   . SHOULDER SURGERY Right   . TOTAL HIP ARTHROPLASTY Left 11/25/2015   Procedure: TOTAL HIP ARTHROPLASTY;  Surgeon: Donato Heinz, MD;  Location: [facility] ORS;  Service: Orthopedics;  Laterality: Left;  . TRIGGER FINGER RELEASE Bilateral     Family History  Problem Relation Age of Onset  . Heart failure Mother   . COPD Mother    Social History:  reports that she quit smoking about 29 years ago. Her smoking use included cigarettes. She has a 45.00 pack-year smoking history. She has never used smokeless tobacco. She reports current alcohol use of about 1.0 standard drink of alcohol per week. She reports that she does not use drugs.  Allergies:  Allergies  Allergen Reactions  . Beef-Derived Products Other (See Comments)    Tears up  my stomach.  . Oxycodone Other (See Comments)    Makes head foggy and interferes with Meniere's syndrome.    . Sulfa Antibiotics Other (See Comments)    Hallucinations  . Zithromax [Azithromycin] Hives    Medications Prior to Admission  Medication Sig Dispense Refill  . acetaminophen (TYLENOL) 500 MG tablet Take 1,500 mg by mouth 3 (three) times daily as needed for moderate pain.     Marland Kitchen albuterol  (PROVENTIL HFA;VENTOLIN HFA) 108 (90 Base) MCG/ACT inhaler Inhale 2 puffs into the lungs every 6 (six) hours as needed for wheezing or shortness of breath.    Marland Kitchen amLODipine (NORVASC) 5 MG tablet Take 5 mg by mouth every morning.     . cholecalciferol (VITAMIN D3) 25 MCG (1000 UNIT) tablet Take 1,000 Units by mouth at bedtime.    . clonazePAM (KLONOPIN) 0.5 MG tablet Take 0.5 mg by mouth at bedtime.    . diclofenac Sodium (VOLTAREN) 1 % GEL Apply 2 g topically 3 (three) times daily as needed (pain).    . fosinopril (MONOPRIL) 40 MG tablet Take 40 mg by mouth every morning.     . gabapentin (NEURONTIN) 300 MG capsule Take 600 mg by mouth at bedtime.     . hydrochlorothiazide (HYDRODIURIL) 25 MG tablet Take 25 mg by mouth every morning.     . meclizine (ANTIVERT) 12.5 MG tablet Take 12.5 mg by mouth 3 (three) times daily as needed for dizziness.    . meloxicam (MOBIC) 7.5 MG tablet Take 7.5 mg by mouth 2 (two) times daily.     . metoprolol succinate (TOPROL-XL) 50 MG 24 hr tablet Take 50 mg by mouth daily. Take with or immediately following a meal.    . Multiple Vitamin (MULTIVITAMIN) tablet Take 1 tablet by mouth every morning.     Marland Kitchen omeprazole (PRILOSEC) 20 MG capsule Take 20 mg by mouth 2 (two) times daily.     Marland Kitchen zolpidem (AMBIEN) 5 MG tablet Take 5 mg by mouth at bedtime.    Marland Kitchen alum hydroxide-mag trisilicate (GAVISCON) 80-20 MG CHEW chewable tablet Chew 1 tablet by mouth daily as needed for indigestion or heartburn. (Patient not taking: Reported on 04/11/2020)    . baclofen (LIORESAL) 10 MG tablet Take 0.5-1 tablets (5-10 mg total) by mouth 3 (three) times daily as needed for muscle spasms (Rib pain). (Patient not taking: Reported on 04/11/2020) 30 each 0  . diphenoxylate-atropine (LOMOTIL) 2.5-0.025 MG tablet Take 1 tablet by mouth 4 (four) times daily as needed for diarrhea or loose stools. (Patient not taking: Reported on 04/11/2020) 30 tablet 0  . Omega-3 Fatty Acids (FISH OIL PO) Take 1,040 mg by  mouth every evening. (Patient not taking: Reported on 04/11/2020)    . oxyCODONE-acetaminophen (PERCOCET/ROXICET) 5-325 MG tablet Take 1 tablet by mouth every 6 (six) hours as needed for severe pain. (Patient not taking: Reported on 04/11/2020) 20 tablet 0    Results for orders placed or performed during the hospital encounter of 04/29/20 (from the past 48 hour(s))  SARS Coronavirus 2 by RT PCR (hospital order, performed in [facility] hospital lab) Nasopharyngeal Nasopharyngeal Swab     Status: None   Collection Time: 04/29/20  9:30 AM   Specimen: Nasopharyngeal Swab  Result Value Ref Range   SARS Coronavirus 2 NEGATIVE NEGATIVE    Comment: (NOTE) SARS-CoV-2 target nucleic acids are NOT DETECTED.  The SARS-CoV-2 RNA is generally detectable in upper and lower respiratory specimens during the acute phase of infection. The lowest  concentration of SARS-CoV-2 viral copies this assay can detect is 250 copies / mL. A negative result does not preclude SARS-CoV-2 infection and should not be used as the sole basis for treatment or other patient management decisions.  A negative result may occur with improper specimen collection / handling, submission of specimen other than nasopharyngeal swab, presence of viral mutation(s) within the areas targeted by this assay, and inadequate number of viral copies (<250 copies / mL). A negative result must be combined with clinical observations, patient history, and epidemiological information.  Fact Sheet for Patients:   BoilerBrush.com.cy  Fact Sheet for Healthcare Providers: https://pope.com/  This test is not yet approved or  cleared by the [place] FDA and has been authorized for detection and/or diagnosis of SARS-CoV-2 by FDA under an Emergency Use Authorization (EUA).  This EUA will remain in effect (meaning this test can be used) for the duration of the COVID-19 declaration under Section 564(b)(1)  of the Act, 21 U.S.C. section 360bbb-3(b)(1), unless the authorization is terminated or revoked sooner.  Performed at [facility], [address] [place], [place] [address]    No results found.  Review of Systems  Musculoskeletal: Positive for back pain.  Neurological: Positive for numbness.    Blood pressure (!) 153/71, pulse 70, temperature 97.9 F (36.6 C), temperature source Oral, resp. rate 20, height 4\' 11"  (1.499 m), weight (!) 97.5 kg, SpO2 95 %. Physical Exam HENT:     Head: Normocephalic.     Nose: Nose normal.     Mouth/Throat:     Mouth: Mucous membranes are moist.  Eyes:     Pupils: Pupils are equal, round, and reactive to light.  Cardiovascular:     Rate and Rhythm: Normal rate.     Pulses: Normal pulses.  Pulmonary:     Effort: Pulmonary effort is normal.  Abdominal:     General: Abdomen is flat.  Musculoskeletal:        General: Normal range of motion.     Cervical back: Normal range of motion.  Neurological:     Mental Status: She is alert.     Comments: Patient is awake alert oriented strength is 5-5 iliopsoas, quads, hamstrings, gastroc, and tibialis, and EHL.      Assessment/Plan 74 year old presents for right-sided L3-4 L4-5 microdiscectomy  61, MD 04/29/2020, 12:04 PM

## 2020-04-29 NOTE — Anesthesia Procedure Notes (Signed)
Procedure Name: Intubation Date/Time: 04/29/2020 12:59 PM Performed by: Caren Macadam, CRNA Pre-anesthesia Checklist: Patient identified, Emergency Drugs available, Suction available and Patient being monitored Patient Re-evaluated:Patient Re-evaluated prior to induction Oxygen Delivery Method: Circle system utilized Preoxygenation: Pre-oxygenation with 100% oxygen Induction Type: IV induction Ventilation: Mask ventilation without difficulty Laryngoscope Size: Miller and 2 Grade View: Grade I Tube type: Oral Tube size: 7.0 mm Number of attempts: 1 Airway Equipment and Method: Stylet Placement Confirmation: ETT inserted through vocal cords under direct vision,  positive ETCO2 and breath sounds checked- equal and bilateral Secured at: 23 cm Tube secured with: Tape Dental Injury: Teeth and Oropharynx as per pre-operative assessment

## 2020-04-29 NOTE — Op Note (Signed)
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  On the right.  Surgeon: Jillyn Hidden Anndrea Mihelich  Assistant: Julien Girt  Anesthesia: General  EBL: Minimal  HPI: 74 year old female with progressive worsening right leg pain rating down L4-L5 nerve root pattern work-up revealed severe spinal stenosis at L4-5 with a herniated disc as well as severe spinal stenosis at L3-4 with a herniated disc and an inferior free fragment.  Due to patient progression of clinical syndrome imaging findings failed conservative treatment I recommended laminectomy microdiscectomies at both levels.  I extensively went over the risks and benefits of the operation with her as well as perioperative course expectations of outcome and alternatives of surgery and she understood and agreed to proceed forward.  Operative procedure: Patient was brought into the OR was due to general anesthesia positioned prone Wilson frame her back was prepped and draped in routine sterile fashion reoperative x-ray localized the appropriate level so after infiltration of 10 cc lidocaine with epi midline incision was made and Bovie letter cautery was used to take down the subcutaneous tissue and subperiosteal dissection was carried out lamina of L3-L4 and L5 on the right.  Intraoperative x-ray confirmed identification appropriate level.  Then a high-speed drill was drilled on the inferior aspect of lamina of L3 medial facet complex the entire lamina of L4 and superior aspect level 5 the medial facet complex at 4 5 then laminotomy was begun with a 3 mm Kerrison punch ligament flavum was identified removed in piecemeal fashion under microscopic lamination continue the laminotomy by underbite and  the medial facet complex was a large spur at L3-4 as well as at L4-5 causing severe hourglass compression of thecal sac and ligament was markedly hypertrophied.  This was all removed decompressing the central canal identified the disc base L4 pedicle unroofed the L4 nerve root marching inferiorly tract the L4 nerve root out down to the L4-5 disc base and identified the L5 nerve root L5 pedicle and unroofed the L5 nerve root.  Inspected both the spaces both the spaces were bulging and causing stenosis so I performed microdiscectomies at both the spaces with annulotomy was made pituitary rongeurs and Epstein curettes.  I explored the entire track of the L4 nerve root I did not appreciate any free fragment under the L4 nerve root but the foramen was widely patent the nerve roots were easily mobilizable explored all foramina with a coronary dilator ankle hockey-stick.  Wound was then copiously irrigated meticulous hemostasis was maintained the wounds then closed in layers of Vicryl skin was closed running 4 subcuticular Dermabond benzoin Steri-Strips and a sterile dressing was applied patient recovery in stable condition.  At the end the case all needle count sponge counts were correct.

## 2020-04-29 NOTE — Progress Notes (Signed)
Patient discharged home per order. Patient transported with all belongings via wheelchair by Staff. Patient stated understanding of discharged instructions given. Patient has an appointment with MD in 2 weeks.

## 2020-04-29 NOTE — Transfer of Care (Signed)
Immediate Anesthesia Transfer of Care Note  Patient: Katrina Poole  Procedure(s) Performed: Microdiscectomy - Lumbar Three-Lumbar Four - Lumbar Four-Lumbar Five - right (Right Back)  Patient Location: PACU  Anesthesia Type:General  Level of Consciousness: awake, alert , oriented and patient cooperative  Airway & Oxygen Therapy: Patient Spontanous Breathing and Patient connected to nasal cannula oxygen  Post-op Assessment: Report given to RN and Post -op Vital signs reviewed and stable  Post vital signs: Reviewed and stable  Last Vitals:  Vitals Value Taken Time  BP    Temp    Pulse 85 04/29/20 1445  Resp 18 04/29/20 1445  SpO2 95 % 04/29/20 1445  Vitals shown include unvalidated device data.  Last Pain:  Vitals:   04/29/20 0926  TempSrc: Oral         Complications: No complications documented.

## 2020-04-29 NOTE — Evaluation (Signed)
Physical Therapy Evaluation and discharge Patient Details Name: Katrina Poole MRN: [id number] DOB: May 29, 1946 Today's Date: 04/29/2020   History of Present Illness  Pt is a 74 y/o female s/p L3-5 Microdiscectomy. PMH HTN, vertigo, asthma, s/p L THA, and s/p bilateral knee replacements.   Clinical Impression  Patient evaluated by Physical Therapy with no further acute PT needs identified. All education has been completed and the patient has no further questions. Pt overall at a supervision to mod I level with transfers, gait and stair navigation. No LOB noted. Educated about precautions, walking program, and how to maintain precautions during ADLs. See below for any follow-up Physical Therapy or equipment needs. PT is signing off. Thank you for this referral. If needs change, please re-consult.     Follow Up Recommendations No PT follow up    Equipment Recommendations  None recommended by PT    Recommendations for Other Services       Precautions / Restrictions Precautions Precautions: Back Precaution Booklet Issued: Yes (comment) Precaution Comments: Reviewed back precautions with pt.  Restrictions Weight Bearing Restrictions: No      Mobility  Bed Mobility               General bed mobility comments: In hall with RN at beginning of session. Educated about log roll technique when performing bed mobility.   Transfers Overall transfer level: Modified independent                  Ambulation/Gait Ambulation/Gait assistance: Supervision Gait Distance (Feet): 200 Feet Assistive device: None Gait Pattern/deviations: Step-through pattern;Decreased stride length;Wide base of support Gait velocity: Decreased   General Gait Details: Slower gait speed, but overall steady. Supervision for safety. Educated about generalized walking program to perform at home.   Stairs Stairs: Yes Stairs assistance: Supervision Stair Management: Two rails;Alternating pattern;Step  to pattern;Forwards Number of Stairs: 6 General stair comments: Used alternating pattern for ascending and step to pattern for descending. Overall steady with no LOB noted.   Wheelchair Mobility    Modified Rankin (Stroke Patients Only)       Balance Overall balance assessment: No apparent balance deficits (not formally assessed)                                           Pertinent Vitals/Pain Pain Assessment: Faces Faces Pain Scale: Hurts a little bit Pain Location: back Pain Descriptors / Indicators: Grimacing;Guarding Pain Intervention(s): Monitored during session;Limited activity within patient's tolerance;Repositioned    Home Living Family/patient expects to be discharged to:: Private residence Living Arrangements: Spouse/significant other Available Help at Discharge: Family;Available 24 hours/day Type of Home: House Home Access: Stairs to enter Entrance Stairs-Rails: Lawyer of Steps: 6 Home Layout: One level Home Equipment: None      Prior Function Level of Independence: Independent               Hand Dominance        Extremity/Trunk Assessment   Upper Extremity Assessment Upper Extremity Assessment: Overall WFL for tasks assessed    Lower Extremity Assessment Lower Extremity Assessment: Overall WFL for tasks assessed;RLE deficits/detail RLE Deficits / Details: Reports R leg weakness is much better    Cervical / Trunk Assessment Cervical / Trunk Assessment: Other exceptions Cervical / Trunk Exceptions: s/p lumbar surgery.   Communication   Communication: No difficulties  Cognition Arousal/Alertness: Awake/alert Behavior During  Therapy: WFL for tasks assessed/performed Overall Cognitive Status: Within Functional Limits for tasks assessed                                        General Comments General comments (skin integrity, edema, etc.): Educated about maintaining precautions  during car transfer and during ADLs.     Exercises     Assessment/Plan    PT Assessment Patent does not need any further PT services  PT Problem List         PT Treatment Interventions      PT Goals (Current goals can be found in the Care Plan section)  Acute Rehab PT Goals Patient Stated Goal: to go home PT Goal Formulation: With patient Time For Goal Achievement: 04/29/20 Potential to Achieve Goals: Good    Frequency     Barriers to discharge        Co-evaluation               AM-PAC PT "6 Clicks" Mobility  Outcome Measure Help needed turning from your back to your side while in a flat bed without using bedrails?: None Help needed moving from lying on your back to sitting on the side of a flat bed without using bedrails?: None Help needed moving to and from a bed to a chair (including a wheelchair)?: None Help needed standing up from a chair using your arms (e.g., wheelchair or bedside chair)?: None Help needed to walk in hospital room?: None Help needed climbing 3-5 steps with a railing? : None 6 Click Score: 24    End of Session Equipment Utilized During Treatment: Gait belt Activity Tolerance: Patient tolerated treatment well Patient left: in chair;with call bell/phone within reach;with family/visitor present Nurse Communication: Mobility status PT Visit Diagnosis: Other abnormalities of gait and mobility (R26.89)    Time: 4967-5916 PT Time Calculation (min) (ACUTE ONLY): 22 min   Charges:   PT Evaluation $PT Eval Low Complexity: 1 Low          Cindee Salt, DPT  Acute Rehabilitation Services  Pager: [phone] Office: [phone]   Lehman Prom 04/29/2020, 6:13 PM

## 2020-04-30 ENCOUNTER — Encounter (HOSPITAL_COMMUNITY): Payer: Self-pay | Admitting: Neurosurgery

## 2020-08-03 IMAGING — CR DG LUMBAR SPINE 2-3V
2 series · 2 of 2 positions shown · non-contrast
Comparison: Lumbar spine MRI dated 03/29/2020.

CLINICAL DATA: 74-year-old female with micro discectomy.

EXAM:
LUMBAR SPINE - 2-3 VIEW

[lateral (1 of 2)]
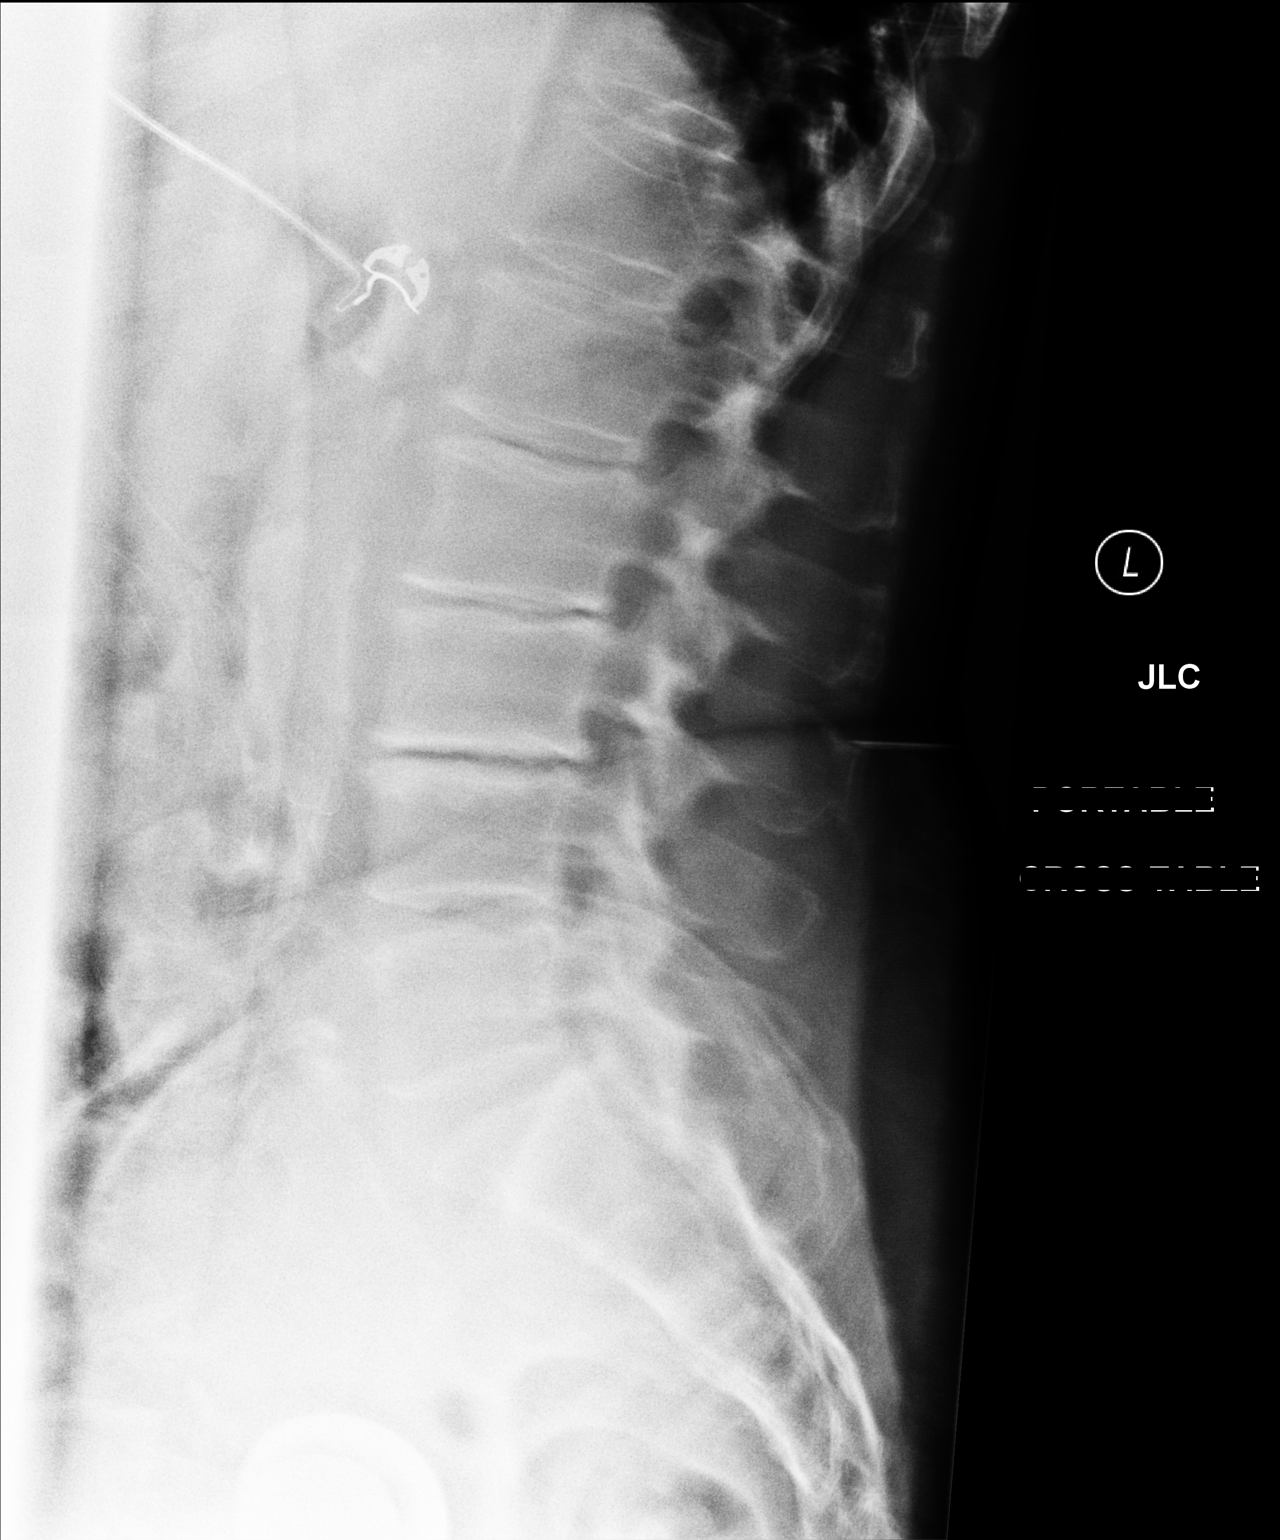

[lateral (2 of 2)]
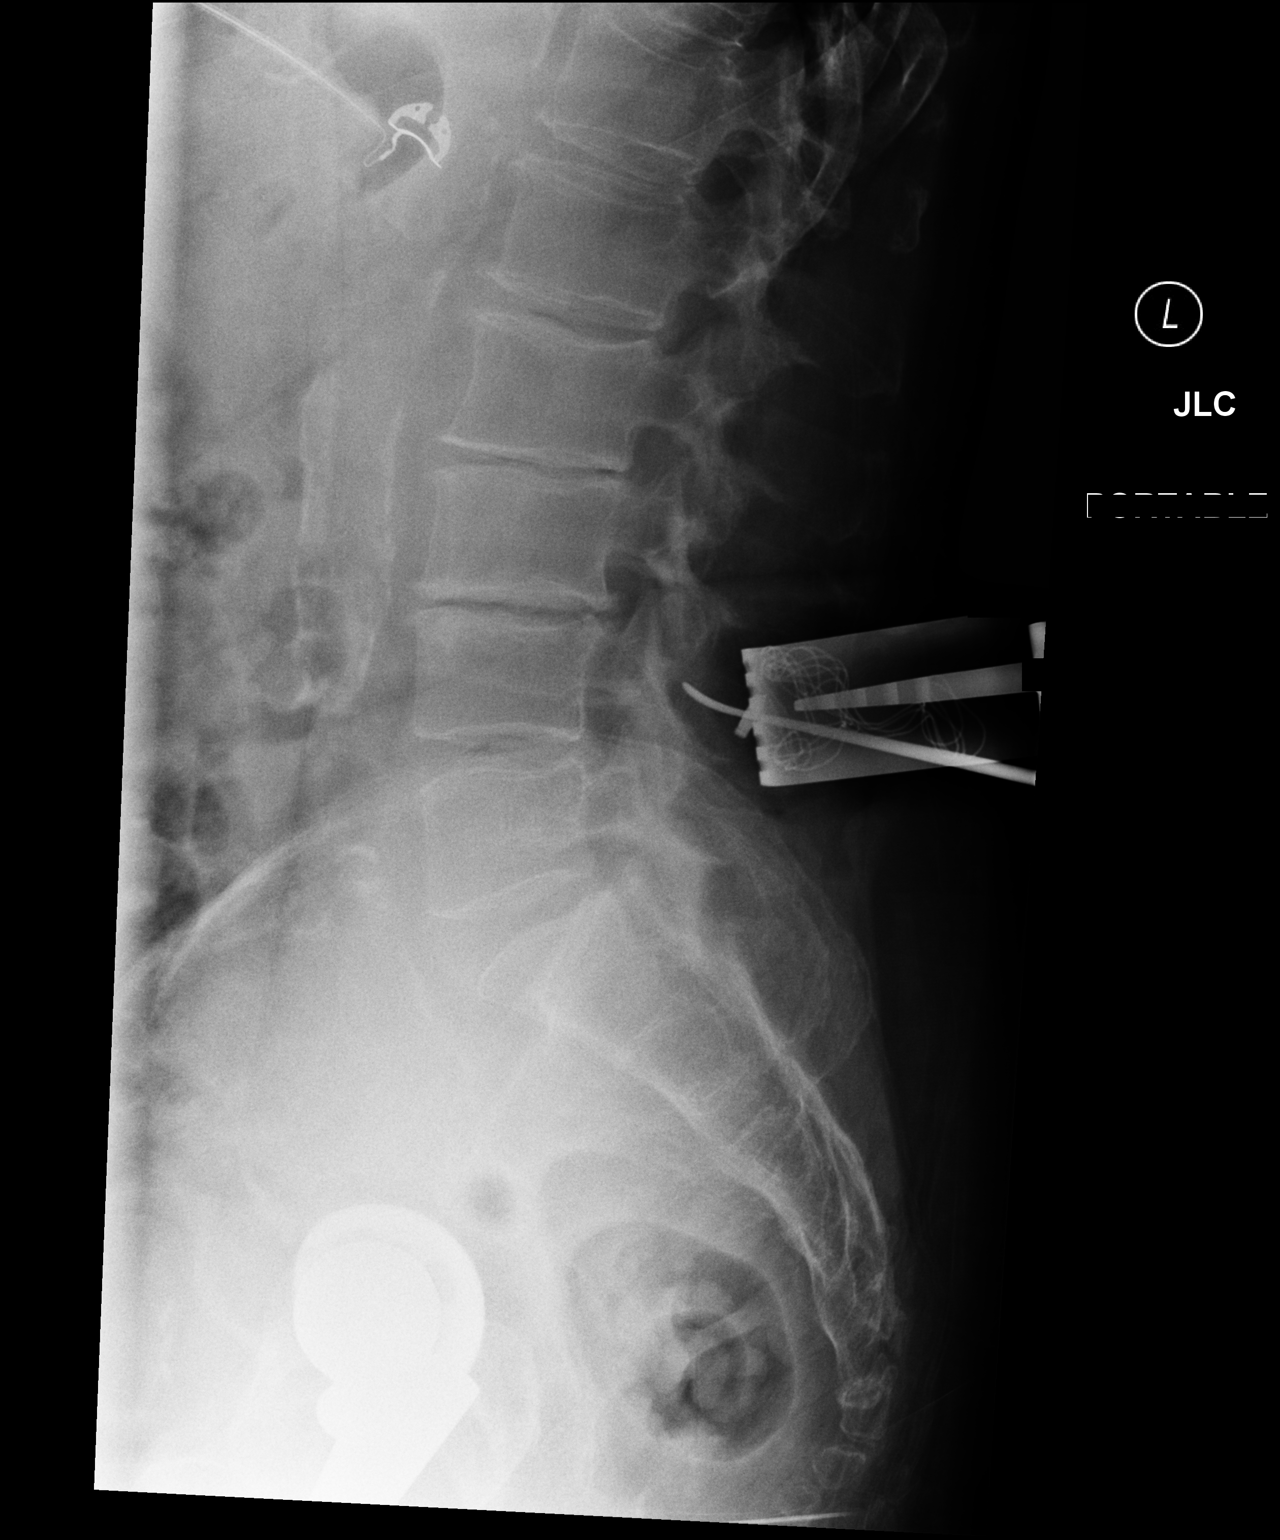

[2 of 2 positions shown; findings below may reference images not displayed]

FINDINGS: Lateral radiograph of the lumbar spine provided. A probe is noted
with tip at the level of the L4 spinous process. There is
degenerative changes of the spine. Atherosclerotic calcification of
the aorta. Hip arthroplasty noted.
IMPRESSION: Intraoperative localization as described.

## 2020-08-06 LAB — HEPATIC FUNCTION PANEL
ALT: 16 (ref 7–35)
AST: 18 (ref 13–35)

## 2020-08-16 ENCOUNTER — Other Ambulatory Visit: Payer: Self-pay

## 2020-08-16 ENCOUNTER — Ambulatory Visit (INDEPENDENT_AMBULATORY_CARE_PROVIDER_SITE_OTHER): Payer: Medicare Other | Admitting: Internal Medicine

## 2020-08-16 ENCOUNTER — Encounter: Payer: Self-pay | Admitting: Internal Medicine

## 2020-08-16 ENCOUNTER — Other Ambulatory Visit: Payer: Self-pay | Admitting: Internal Medicine

## 2020-08-16 VITALS — BP 120/70 | HR 64 | Temp 98.0°F | Ht 60.0 in | Wt 223.0 lb

## 2020-08-16 DIAGNOSIS — I10 Essential (primary) hypertension: Secondary | ICD-10-CM | POA: Diagnosis not present

## 2020-08-16 DIAGNOSIS — F5101 Primary insomnia: Secondary | ICD-10-CM | POA: Diagnosis not present

## 2020-08-16 DIAGNOSIS — J4520 Mild intermittent asthma, uncomplicated: Secondary | ICD-10-CM | POA: Insufficient documentation

## 2020-08-16 DIAGNOSIS — Z1231 Encounter for screening mammogram for malignant neoplasm of breast: Secondary | ICD-10-CM

## 2020-08-16 DIAGNOSIS — H8109 Meniere's disease, unspecified ear: Secondary | ICD-10-CM

## 2020-08-16 DIAGNOSIS — M1712 Unilateral primary osteoarthritis, left knee: Secondary | ICD-10-CM

## 2020-08-16 DIAGNOSIS — D638 Anemia in other chronic diseases classified elsewhere: Secondary | ICD-10-CM

## 2020-08-16 DIAGNOSIS — K219 Gastro-esophageal reflux disease without esophagitis: Secondary | ICD-10-CM

## 2020-08-16 DIAGNOSIS — G2581 Restless legs syndrome: Secondary | ICD-10-CM | POA: Diagnosis not present

## 2020-08-16 DIAGNOSIS — K5730 Diverticulosis of large intestine without perforation or abscess without bleeding: Secondary | ICD-10-CM | POA: Insufficient documentation

## 2020-08-16 MED ORDER — ZOLPIDEM TARTRATE 5 MG PO TABS: 5.0000 mg | ORAL_TABLET | Freq: Every day | ORAL | 2 refills | Status: DC

## 2020-08-16 MED ORDER — ROPINIROLE HCL 0.5 MG PO TABS: 0.5000 mg | ORAL_TABLET | Freq: Every evening | ORAL | 0 refills | Status: DC

## 2020-08-16 NOTE — Progress Notes (Signed)
Date:  08/16/2020   Name:  Katrina Poole   DOB:  1946/01/17   MRN:  [id number]   Chief Complaint: Establish Care [facility] ) and Pneumonia vaccine (wants vaccine)  Hypertension This is a chronic problem. The problem is controlled. Pertinent negatives include no chest pain, headaches, palpitations or shortness of breath. Past treatments include diuretics, calcium channel blockers, beta blockers and ACE inhibitors. The current treatment provides significant improvement. There is no history of kidney disease, CAD/MI, CVA or heart failure.  Insomnia Primary symptoms: sleep disturbance, difficulty falling asleep, frequent awakening.  The problem occurs nightly. The problem is unchanged. Past treatments include medication (takes ambien and clonazepam 2-3 hours before bed). Typical bedtime:  10-11 P.M..  How long after going to bed to you fall asleep: less than 15 minutes.    Asthma She complains of chest tightness and wheezing. There is no cough or shortness of breath. This is a recurrent problem. The problem occurs intermittently. The problem has been unchanged. Pertinent negatives include no chest pain, fever or headaches. Her symptoms are aggravated by exercise, exposure to smoke and change in weather. Her symptoms are alleviated by beta-agonist. She reports significant improvement on treatment. Her past medical history is significant for asthma.  RLS - has sx starting in the evening.  She is on gabapentin and tried to increase the dose up to 5 capsules but it made her sx worse.  She has never tried  Requip or Mirapex.  Lab Results  Component Value Date   CREATININE 0.95 04/22/2020   BUN 25 (H) 04/22/2020   NA 138 04/22/2020   K 3.9 04/22/2020   CL 103 04/22/2020   CO2 25 04/22/2020   No results found for: CHOL, HDL, LDLCALC, LDLDIRECT, TRIG, CHOLHDL No results found for: TSH No results found for: HGBA1C Lab Results  Component Value Date   WBC 5.9  04/22/2020   HGB 12.1 04/22/2020   HCT 37.5 04/22/2020   MCV 93.5 04/22/2020   PLT 307 04/22/2020   Lab Results  Component Value Date   ALT 16 08/06/2020   AST 18 08/06/2020     Review of Systems  Constitutional: Negative for chills, fever and unexpected weight change.  Respiratory: Positive for wheezing. Negative for cough, chest tightness and shortness of breath.   Cardiovascular: Negative for chest pain, palpitations and leg swelling.  Gastrointestinal: Positive for constipation and diarrhea. Negative for abdominal pain.  Genitourinary: Negative for dysuria.  Musculoskeletal: Positive for arthralgias, back pain and gait problem.  Skin: Negative for color change and rash.  Neurological: Positive for dizziness. Negative for tremors, weakness, light-headedness and headaches.  Psychiatric/Behavioral: Positive for sleep disturbance. Negative for dysphoric mood and suicidal ideas. The patient is nervous/anxious and has insomnia.     Patient Active Problem List   Diagnosis Date Noted  . Essential hypertension 08/16/2020  . Meniere's disease 08/16/2020  . HNP (herniated nucleus pulposus), lumbar 04/29/2020  . Gastroesophageal reflux disease without esophagitis 01/24/2020  . Anemia of chronic disease 01/24/2020  . 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 Osteoarthritis of knee 02/06/2018  . S/P total hip arthroplasty 11/25/2015  . Status post arthroscopic surgery of left knee 08/15/2015    Allergies  Allergen Reactions  . Sulfa Antibiotics Other (See Comments)    Hallucinations  . Zithromax [Azithromycin] Hives  . Oxycodone Other (See Comments)    Makes head foggy and interferes with Meniere's syndrome.    . Beef-Derived Products  Other (See Comments)    Tears up my stomach.    Past Surgical History:  Procedure Laterality Date  . ANTERIOR CERVICAL DECOMP/DISCECTOMY FUSION N/A 03/16/2018   Procedure: Anterior Cervical Discectomy Fusion - Cervical Three- Cervical Four,  Cervical Four- Cervical Five, Cervical Five-Cervical Six, Cervical Six- Cerival Seven;  Surgeon: Donalee Citrin, MD;  Location: [facility] OR;  Service: Neurosurgery;  Laterality: N/A;  Anterior Cervical Discectomy Fusion - Cervical Three- Cervical Four, Cervical Four- Cervical Five, Cervical Five-Cervical Six, Cervical Six- Ceriva  . CARDIAC CATHETERIZATION     x2 both normal  . CARPAL TUNNEL RELEASE    . COLONOSCOPY    . COLONOSCOPY WITH PROPOFOL N/A 06/04/2016   Procedure: COLONOSCOPY WITH PROPOFOL;  Surgeon: Christena Deem, MD;  Location: [facility] ENDOSCOPY;  Service: Endoscopy;  Laterality: N/A;  . DILATION AND CURETTAGE OF UTERUS    . ECTOPIC PREGNANCY SURGERY    . ESOPHAGOGASTRODUODENOSCOPY (EGD) WITH PROPOFOL N/A 06/04/2016   Procedure: ESOPHAGOGASTRODUODENOSCOPY (EGD) WITH PROPOFOL;  Surgeon: Christena Deem, MD;  Location: [facility] ENDOSCOPY;  Service: Endoscopy;  Laterality: N/A;  . exploratory laparoscopy     infertility surgery  . HALLUX VALGUS CORRECTION Bilateral   . JOINT REPLACEMENT Bilateral    partial knee  . KNEE ARTHROSCOPY Left 07/19/2015   Procedure: Arthroscopic limited synovectomy along with lateral compartment and retropatellar chondral debridement plus removal of several small loose bodies;  Surgeon: Erin Sons, MD;  Location: [facility];  Service: Orthopedics;  Laterality: Left;  1ST CASE PER CECE  . LUMBAR LAMINECTOMY/DECOMPRESSION MICRODISCECTOMY Right 04/29/2020   Procedure: Microdiscectomy - Lumbar Three-Lumbar Four - Lumbar Four-Lumbar Five - right;  Surgeon: Donalee Citrin, MD;  Location: [facility] OR;  Service: Neurosurgery;  Laterality: Right;  Microdiscectomy - Lumbar Three-Lumbar Four - Lumbar Four-Lumbar Five - right  . MEDIAL PARTIAL KNEE REPLACEMENT Bilateral   . SHOULDER SURGERY Right   . TOTAL HIP ARTHROPLASTY Left 11/25/2015   Procedure: TOTAL HIP ARTHROPLASTY;  Surgeon: Donato Heinz, MD;  Location: [facility] ORS;  Service: Orthopedics;  Laterality: Left;  . TRIGGER  FINGER RELEASE Bilateral     Social History   Tobacco Use  . Smoking status: Former Smoker    Packs/day: 1.50    Years: 30.00    Pack years: 45.00    Types: Cigarettes    Quit date: 11/12/1990    Years since quitting: 29.7  . Smokeless tobacco: Never Used  Vaping Use  . Vaping Use: Never used  Substance Use Topics  . Alcohol use: Yes    Alcohol/week: 1.0 standard drink    Types: 1 Standard drinks or equivalent per week    Comment: 1 standard drink a week.   . Drug use: No     Medication list has been reviewed and updated.  Current Meds  Medication Sig  . acetaminophen (TYLENOL) 500 MG tablet Take 1,500 mg by mouth 3 (three) times daily as needed for moderate pain.   Marland Kitchen albuterol (PROVENTIL HFA;VENTOLIN HFA) 108 (90 Base) MCG/ACT inhaler Inhale 2 puffs into the lungs every 6 (six) hours as needed for wheezing or shortness of breath.  Marland Kitchen alum hydroxide-mag trisilicate (GAVISCON) 80-20 MG CHEW chewable tablet Chew 1 tablet by mouth daily as needed for indigestion or heartburn.   Marland Kitchen amLODipine (NORVASC) 5 MG tablet Take 5 mg by mouth every morning.   . cephALEXin (KEFLEX) 500 MG capsule Take 500 mg by mouth 4 (four) times daily.  . cholecalciferol (VITAMIN D3) 25 MCG (1000 UNIT) tablet Take  1,000 Units by mouth at bedtime.  . clonazePAM (KLONOPIN) 0.5 MG tablet Take 0.5 mg by mouth at bedtime.  . clotrimazole-betamethasone (LOTRISONE) cream Apply topically. Dr. Excell Seltzer- foot doctor  . fosinopril (MONOPRIL) 40 MG tablet Take 40 mg by mouth every morning.   . gabapentin (NEURONTIN) 300 MG capsule Take 600 mg by mouth at bedtime.   . hydrochlorothiazide (HYDRODIURIL) 25 MG tablet Take 25 mg by mouth every morning.   . meclizine (ANTIVERT) 12.5 MG tablet Take 12.5 mg by mouth 3 (three) times daily as needed for dizziness. Dr. Eduard Clos  . meloxicam (MOBIC) 7.5 MG tablet Take 7.5 mg by mouth 2 (two) times daily.   . metoprolol succinate (TOPROL-XL) 50 MG 24 hr tablet Take 50 mg by mouth daily.  Take with or immediately following a meal.  . Multiple Vitamin (MULTIVITAMIN) tablet Take 1 tablet by mouth every morning.   Marland Kitchen omeprazole (PRILOSEC) 20 MG capsule Take 20 mg by mouth 2 (two) times daily.   Marland Kitchen terbinafine (LAMISIL) 250 MG tablet Take 250 mg by mouth daily. Dr. Excell Seltzer  . zolpidem (AMBIEN) 5 MG tablet Take 5 mg by mouth at bedtime.    PHQ 2/9 Scores 08/16/2020  PHQ - 2 Score 0  PHQ- 9 Score 0    GAD 7 : Generalized Anxiety Score 08/16/2020  Nervous, Anxious, on Edge 0  Control/stop worrying 0  Worry too much - different things 0  Trouble relaxing 0  Restless 0  Easily annoyed or irritable 0  Afraid - awful might happen 0  Total GAD 7 Score 0    BP Readings from Last 3 Encounters:  08/16/20 120/70  04/29/20 (!) 144/67  04/22/20 (!) 147/80    Physical Exam Vitals and nursing note reviewed.  Constitutional:      General: She is not in acute distress.    Appearance: She is well-developed. She is obese.  HENT:     Head: Normocephalic and atraumatic.  Neck:     Vascular: No carotid bruit.  Cardiovascular:     Rate and Rhythm: Normal rate and regular rhythm.     Pulses: Normal pulses.     Heart sounds: No murmur heard.   Pulmonary:     Effort: Pulmonary effort is normal. No respiratory distress.  Musculoskeletal:     Cervical back: Normal range of motion.  Lymphadenopathy:     Cervical: No cervical adenopathy.  Skin:    General: Skin is warm and dry.     Capillary Refill: Capillary refill takes less than 2 seconds.     Findings: No rash.  Neurological:     General: No focal deficit present.     Mental Status: She is alert and oriented to person, place, and time.  Psychiatric:        Mood and Affect: Mood normal.     Wt Readings from Last 3 Encounters:  08/16/20 223 lb (101.2 kg)  04/29/20 (!) 215 lb (97.5 kg)  04/22/20 217 lb 5 oz (98.6 kg)    BP 120/70   Pulse 64   Temp 98 F (36.7 C) (Oral)   Ht 5' (1.524 m)   Wt 223 lb (101.2 kg)   SpO2  96%   BMI 43.55 kg/m   Assessment and Plan: 1. Essential hypertension Clinically stable exam with well controlled BP on 4 meds. Tolerating medications without side effects at this time. Pt to continue current regimen and low sodium diet; benefits of regular exercise as able discussed.  2. Meniere's  disease, unspecified laterality On HCTZ - sx stable, chronic for years  3. Restless leg syndrome Incomplete benefit from gabapentin Will discontinue gabapentin and begin requip - 0.5 mg and titrate up every 5 days to 1.5 mg every evening. Follow up in one month - rOPINIRole (REQUIP) 0.5 MG tablet; Take 1-3 tablets (0.5-1.5 mg total) by mouth every evening.  Dispense: 90 tablet; Refill: 0  4. Primary insomnia Continue clonazepam in the evening to reduce anxiety Take Ambien just before bed  - zolpidem (AMBIEN) 5 MG tablet; Take 1 tablet (5 mg total) by mouth at bedtime.  Dispense: 30 tablet; Refill: 2  5. Mild intermittent asthma without complication Continue Albuterol inhaler PRN  6. Encounter for screening mammogram for breast cancer Pt will schedule at [facility] - MM 3D SCREEN BREAST BILATERAL; Future   Partially dictated using Animal nutritionist. Any errors are unintentional.  Bari Edward, MD [facility]  08/16/2020

## 2020-09-04 ENCOUNTER — Encounter: Payer: Self-pay | Admitting: Internal Medicine

## 2020-09-04 ENCOUNTER — Other Ambulatory Visit: Payer: Self-pay | Admitting: Internal Medicine

## 2020-09-04 DIAGNOSIS — F411 Generalized anxiety disorder: Secondary | ICD-10-CM

## 2020-09-04 MED ORDER — CLONAZEPAM 0.5 MG PO TABS: 0.5000 mg | ORAL_TABLET | Freq: Every day | ORAL | 0 refills | Status: DC | PRN

## 2020-09-16 ENCOUNTER — Encounter: Payer: Self-pay | Admitting: Internal Medicine

## 2020-09-16 ENCOUNTER — Other Ambulatory Visit: Payer: Self-pay

## 2020-09-16 ENCOUNTER — Ambulatory Visit (INDEPENDENT_AMBULATORY_CARE_PROVIDER_SITE_OTHER): Payer: Medicare Other | Admitting: Internal Medicine

## 2020-09-16 VITALS — BP 138/76 | HR 61 | Temp 97.6°F | Ht 60.0 in | Wt 223.0 lb

## 2020-09-16 DIAGNOSIS — I10 Essential (primary) hypertension: Secondary | ICD-10-CM

## 2020-09-16 DIAGNOSIS — J4520 Mild intermittent asthma, uncomplicated: Secondary | ICD-10-CM

## 2020-09-16 DIAGNOSIS — F5101 Primary insomnia: Secondary | ICD-10-CM | POA: Diagnosis not present

## 2020-09-16 DIAGNOSIS — G2581 Restless legs syndrome: Secondary | ICD-10-CM | POA: Diagnosis not present

## 2020-09-16 MED ORDER — GABAPENTIN 300 MG PO CAPS
600.0000 mg | ORAL_CAPSULE | Freq: Every day | ORAL | 1 refills | Status: DC
Start: 2020-09-16 — End: 2021-04-25

## 2020-09-16 MED ORDER — ALBUTEROL SULFATE HFA 108 (90 BASE) MCG/ACT IN AERS: 2.0000 | INHALATION_SPRAY | Freq: Four times a day (QID) | RESPIRATORY_TRACT | 1 refills | Status: DC | PRN

## 2020-09-16 NOTE — Progress Notes (Signed)
Date:  09/16/2020   Name:  Katrina Poole   DOB:  May 25, 1946   MRN:  [id number]   Chief Complaint: Hypertension, Insomnia, and restless leg (Requip is not working )  Insomnia The problem occurs nightly. The problem has been gradually improving (much better since taking ambien just before bed) since onset. Past treatments include medication (was taking medication too early - times adjusted last month).  Asthma She complains of chest tightness, cough and wheezing. There is no shortness of breath. This is a recurrent problem. The problem occurs intermittently. Pertinent negatives include no fever. Her symptoms are alleviated by beta-agonist (needs refill). Her past medical history is significant for asthma.  Restless leg syndrome - medications changed last visit.  Gabapentin stopped and Requip started. However, she was waking up too early on Requip so went back to gabapentin.  Lab Results  Component Value Date   CREATININE 0.95 04/22/2020   BUN 25 (H) 04/22/2020   NA 138 04/22/2020   K 3.9 04/22/2020   CL 103 04/22/2020   CO2 25 04/22/2020   Lab Results  Component Value Date   CHOL 200 04/02/2020   HDL 57 04/02/2020   LDLCALC 119 04/02/2020   TRIG 119 04/02/2020   No results found for: TSH No results found for: HGBA1C Lab Results  Component Value Date   WBC 5.9 04/22/2020   HGB 12.1 04/22/2020   HCT 37.5 04/22/2020   MCV 93.5 04/22/2020   PLT 307 04/22/2020   Lab Results  Component Value Date   ALT 16 08/06/2020   AST 18 08/06/2020     Review of Systems  Constitutional: Negative for chills, fatigue and fever.  Respiratory: Positive for cough and wheezing. Negative for chest tightness and shortness of breath.   Psychiatric/Behavioral: The patient has insomnia.     Patient Active Problem List   Diagnosis Date Noted  . Essential hypertension 08/16/2020  . Meniere's disease 08/16/2020  . Restless leg syndrome 08/16/2020  . Primary insomnia 08/16/2020  . Mild  intermittent asthma without complication 08/16/2020  . Diverticulosis of colon 08/16/2020  . HNP (herniated nucleus pulposus), lumbar 04/29/2020  . Gastroesophageal reflux disease without esophagitis 01/24/2020  . Anemia of chronic disease 01/24/2020  . 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 Osteoarthritis of knee 02/06/2018  . S/P total hip arthroplasty 11/25/2015  . Status post arthroscopic surgery of left knee 08/15/2015    Allergies  Allergen Reactions  . Sulfa Antibiotics Other (See Comments)    Hallucinations  . Zithromax [Azithromycin] Hives  . Oxycodone Other (See Comments)    Makes head foggy and interferes with Meniere's syndrome.    . Beef-Derived Products Other (See Comments)    Tears up my stomach.    Past Surgical History:  Procedure Laterality Date  . ANTERIOR CERVICAL DECOMP/DISCECTOMY FUSION N/A 03/16/2018   Procedure: Anterior Cervical Discectomy Fusion - Cervical Three- Cervical Four, Cervical Four- Cervical Five, Cervical Five-Cervical Six, Cervical Six- Cerival Seven;  Surgeon: Donalee Citrin, MD;  Location: [facility] OR;  Service: Neurosurgery;  Laterality: N/A;  Anterior Cervical Discectomy Fusion - Cervical Three- Cervical Four, Cervical Four- Cervical Five, Cervical Five-Cervical Six, Cervical Six- Ceriva  . CARDIAC CATHETERIZATION     x2 both normal  . CARPAL TUNNEL RELEASE    . COLONOSCOPY    . COLONOSCOPY WITH PROPOFOL N/A 06/04/2016   Procedure: COLONOSCOPY WITH PROPOFOL;  Surgeon: Christena Deem, MD;  Location[facility] END[facility]COPY;  Service: Endoscopy;  Laterality: N/A;  . DILATION  AND CURETTAGE OF UTERUS    . ECTOPIC PREGNANCY SURGERY    . ESOPHAGOGASTRODUODENOSCOPY (EGD) WITH PROPOFOL N/A 06/04/2016   Procedure: ESOPHAGOGASTRODUODENOSCOPY (EGD) WITH PROPOFOL;  Surgeon: Christena Deem, MD;  Location: [facility] ENDOSCOPY;  Service: Endoscopy;  Laterality: N/A;  . exploratory laparoscopy     infertility surgery  . HALLUX VALGUS CORRECTION Bilateral   . JOINT  REPLACEMENT Bilateral    partial knee  . KNEE ARTHROSCOPY Left 07/19/2015   Procedure: Arthroscopic limited synovectomy along with lateral compartment and retropatellar chondral debridement plus removal of several small loose bodies;  Surgeon: Erin Sons, MD;  Location: [facility];  Service: Orthopedics;  Laterality: Left;  1ST CASE PER CECE  . LUMBAR LAMINECTOMY/DECOMPRESSION MICRODISCECTOMY Right 04/29/2020   Procedure: Microdiscectomy - Lumbar Three-Lumbar Four - Lumbar Four-Lumbar Five - right;  Surgeon: Donalee Citrin, MD;  Location: [facility] OR;  Service: Neurosurgery;  Laterality: Right;  Microdiscectomy - Lumbar Three-Lumbar Four - Lumbar Four-Lumbar Five - right  . MEDIAL PARTIAL KNEE REPLACEMENT Bilateral   . SHOULDER SURGERY Right   . TOTAL HIP ARTHROPLASTY Left 11/25/2015   Procedure: TOTAL HIP ARTHROPLASTY;  Surgeon: Donato Heinz, MD;  Location: [facility] ORS;  Service: Orthopedics;  Laterality: Left;  . TRIGGER FINGER RELEASE Bilateral     Social History   Tobacco Use  . Smoking status: Former Smoker    Packs/day: 1.50    Years: 30.00    Pack years: 45.00    Types: Cigarettes    Quit date: 11/12/1990    Years since quitting: 29.8  . Smokeless tobacco: Never Used  Vaping Use  . Vaping Use: Never used  Substance Use Topics  . Alcohol use: Yes    Alcohol/week: 1.0 standard drink    Types: 1 Standard drinks or equivalent per week    Comment: 1 standard drink a week.   . Drug use: No     Medication list has been reviewed and updated.  Current Meds  Medication Sig  . acetaminophen (TYLENOL) 500 MG tablet Take 1,500 mg by mouth 3 (three) times daily as needed for moderate pain.   Marland Kitchen acetaminophen-codeine (TYLENOL #3) 300-30 MG tablet Take 1 tablet by mouth every 4 (four) hours as needed for moderate pain.  Marland Kitchen albuterol (PROVENTIL HFA;VENTOLIN HFA) 108 (90 Base) MCG/ACT inhaler Inhale 2 puffs into the lungs every 6 (six) hours as needed for wheezing or shortness of breath.   Marland Kitchen alum hydroxide-mag trisilicate (GAVISCON) 80-20 MG CHEW chewable tablet Chew 1 tablet by mouth daily as needed for indigestion or heartburn.   Marland Kitchen amLODipine (NORVASC) 5 MG tablet Take 5 mg by mouth every morning.   . cephALEXin (KEFLEX) 500 MG capsule Take 500 mg by mouth 4 (four) times daily.  . cholecalciferol (VITAMIN D3) 25 MCG (1000 UNIT) tablet Take 1,000 Units by mouth at bedtime.  . clonazePAM (KLONOPIN) 0.5 MG tablet Take 1 tablet (0.5 mg total) by mouth daily as needed for anxiety.  . clotrimazole-betamethasone (LOTRISONE) cream Apply topically. Dr. Excell Seltzer- foot doctor  . fosinopril (MONOPRIL) 40 MG tablet Take 40 mg by mouth every morning.   . hydrochlorothiazide (HYDRODIURIL) 25 MG tablet Take 25 mg by mouth every morning.   . meclizine (ANTIVERT) 12.5 MG tablet Take 12.5 mg by mouth 3 (three) times daily as needed for dizziness. Dr. Eduard Clos  . meloxicam (MOBIC) 7.5 MG tablet Take 7.5 mg by mouth 2 (two) times daily.   . metoprolol succinate (TOPROL-XL) 50 MG 24 hr tablet Take 50 mg  by mouth daily. Take with or immediately following a meal.  . Multiple Vitamin (MULTIVITAMIN) tablet Take 1 tablet by mouth every morning.   Marland Kitchen. omeprazole (PRILOSEC) 20 MG capsule Take 20 mg by mouth 2 (two) times daily.   Marland Kitchen. rOPINIRole (REQUIP) 0.5 MG tablet Take 1-3 tablets (0.5-1.5 mg total) by mouth every evening.  . terbinafine (LAMISIL) 250 MG tablet Take 250 mg by mouth daily. Dr. Excell SeltzerBaker  . zolpidem (AMBIEN) 5 MG tablet Take 1 tablet (5 mg total) by mouth at bedtime.    PHQ 2/9 Scores 09/16/2020 08/16/2020  PHQ - 2 Score 0 0  PHQ- 9 Score 0 0    GAD 7 : Generalized Anxiety Score 09/16/2020 08/16/2020  Nervous, Anxious, on Edge 2 0  Control/stop worrying 0 0  Worry too much - different things 0 0  Trouble relaxing 0 0  Restless 0 0  Easily annoyed or irritable 0 0  Afraid - awful might happen 0 0  Total GAD 7 Score 2 0    BP Readings from Last 3 Encounters:  09/16/20 138/76  08/16/20  120/70  04/29/20 (!) 144/67    Physical Exam Vitals and nursing note reviewed.  Constitutional:      General: She is not in acute distress.    Appearance: She is well-developed.  HENT:     Head: Normocephalic and atraumatic.  Cardiovascular:     Rate and Rhythm: Normal rate and regular rhythm.     Pulses: Normal pulses.     Heart sounds: No murmur heard.   Pulmonary:     Effort: Pulmonary effort is normal. No respiratory distress.     Breath sounds: No wheezing or rhonchi.  Musculoskeletal:     Cervical back: Normal range of motion.     Right lower leg: No edema.     Left lower leg: No edema.  Lymphadenopathy:     Cervical: No cervical adenopathy.  Skin:    General: Skin is warm and dry.     Capillary Refill: Capillary refill takes less than 2 seconds.     Findings: No rash.     Comments: Dry itchy skin in winter  Neurological:     Mental Status: She is alert and oriented to person, place, and time.  Psychiatric:        Mood and Affect: Mood and affect normal.        Behavior: Behavior normal.        Thought Content: Thought content normal.     Wt Readings from Last 3 Encounters:  09/16/20 223 lb (101.2 kg)  08/16/20 223 lb (101.2 kg)  04/29/20 (!) 215 lb (97.5 kg)    BP 138/76   Pulse 61   Temp 97.6 F (36.4 C) (Oral)   Ht 5' (1.524 m)   Wt 223 lb (101.2 kg)   SpO2 97%   BMI 43.55 kg/m   Assessment and Plan: 1. Restless leg syndrome Now back on gabapentin - continue dose of 600 mg qhs - gabapentin (NEURONTIN) 300 MG capsule; Take 2 capsules (600 mg total) by mouth at bedtime.  Dispense: 180 capsule; Refill: 1  2. Primary insomnia Much improved after adjusting the timing of Ambien dose  3. Essential hypertension Clinically stable exam with well controlled BP. Tolerating medications without side effects at this time. Pt to continue current regimen and low sodium diet; benefits of regular exercise as able discussed.  4. Mild intermittent asthma  without complication - albuterol (VENTOLIN HFA) 108 (90 Base)  MCG/ACT inhaler; Inhale 2 puffs into the lungs every 6 (six) hours as needed for wheezing or shortness of breath.  Dispense: 18 g; Refill: 1   Partially dictated using Animal nutritionist. Any errors are unintentional.  Bari Edward, MD [facility]  09/16/2020

## 2020-09-16 NOTE — Patient Instructions (Signed)
Try Hemp lotion for dry itchy skin  Try coconut oil for dryness/itching in genital area

## 2020-10-02 ENCOUNTER — Telehealth: Payer: Self-pay

## 2020-10-02 NOTE — Telephone Encounter (Unsigned)
Copied from CRM [id number]. Topic: General - Other >> Oct 02, 2020  3:32 PM Gwenlyn Fudge wrote: Reason for CRM: Pt called and is requesting to speak with nurse regarding her appts that she has scheduled. She states that she would like to be seen less due to work, and she has some questions. Please advise.

## 2020-10-03 NOTE — Telephone Encounter (Signed)
Patient said she was confused about appt she did not make on 01/03 for AW[facility]. She wants to do this appt on the phone. Explained she iwll need to do these visits yearly for insurance purposes and she verbalized understanding.

## 2020-10-06 ENCOUNTER — Encounter: Payer: Self-pay | Admitting: Internal Medicine

## 2020-10-06 ENCOUNTER — Other Ambulatory Visit: Payer: Self-pay | Admitting: Internal Medicine

## 2020-10-06 DIAGNOSIS — F411 Generalized anxiety disorder: Secondary | ICD-10-CM

## 2020-10-07 ENCOUNTER — Ambulatory Visit (INDEPENDENT_AMBULATORY_CARE_PROVIDER_SITE_OTHER): Payer: Medicare Other

## 2020-10-07 DIAGNOSIS — Z0000 Encounter for general adult medical examination without abnormal findings: Secondary | ICD-10-CM

## 2020-10-07 MED ORDER — CLONAZEPAM 0.5 MG PO TABS
0.5000 mg | ORAL_TABLET | Freq: Every day | ORAL | 2 refills | Status: DC | PRN
Start: 2020-10-07 — End: 2020-12-30

## 2020-10-07 NOTE — Progress Notes (Signed)
Subjective:   Katrina Poole is a 75 y.o. female who presents for an Initial Medicare Annual Wellness Visit.  Virtual Visit via Telephone Note  I connected with  Katrina Poole on 10/07/20 at  2:40 PM EST by telephone and verified that I am speaking with the correct person using two identifiers.  Location: Patient: home Provider: [facility] Persons participating in the virtual visit: patient/Nurse Health Advisor   I discussed the limitations, risks, security and privacy concerns of performing an evaluation and management service by telephone and the availability of in person appointments. The patient expressed understanding and agreed to proceed.  Interactive audio and video telecommunications were attempted between this nurse and patient, however failed, due to patient having technical difficulties OR patient did not have access to video capability.  We continued and completed visit with audio only.  Some vital signs may be absent or patient reported.   Katrina Littler, LPN   Review of Systems     Cardiac Risk Factors include: advanced age (>78men, >31 women);hypertension;obesity (BMI >30kg/m2)     Objective:    Today's Vitals   10/07/20 1445  PainSc: 4    There is no height or weight on file to calculate BMI.  Advanced Directives 10/07/2020 04/29/2020 04/22/2020 09/03/2019 06/10/2019 01/21/2019 08/06/2018  Does Patient Have a Medical Advance Directive? Yes Yes Yes Yes No No No  Type of Estate agent of [place];Living will Healthcare Power of [place];Living will Healthcare Power of [place];Living will Living will - - -  Does patient want to make changes to medical advance directive? - No - Patient declined No - Patient declined - - - -  Copy of Healthcare Power of Attorney in Chart? No - copy requested No - copy requested No - copy requested - - - -  Would patient like information on creating a medical advance directive? - - - - - - No - Patient declined     Current Medications (verified) Outpatient Encounter Medications as of 10/07/2020  Medication Sig  . acetaminophen-codeine (TYLENOL #3) 300-30 MG tablet Take 1 tablet by mouth every 4 (four) hours as needed for moderate pain.  Marland Kitchen albuterol (VENTOLIN HFA) 108 (90 Base) MCG/ACT inhaler Inhale 2 puffs into the lungs every 6 (six) hours as needed for wheezing or shortness of breath.  Marland Kitchen alum hydroxide-mag trisilicate (GAVISCON) 80-20 MG CHEW chewable tablet Chew 1 tablet by mouth daily as needed for indigestion or heartburn.   Marland Kitchen amLODipine (NORVASC) 5 MG tablet Take 5 mg by mouth every morning.   . cholecalciferol (VITAMIN D3) 25 MCG (1000 UNIT) tablet Take 1,000 Units by mouth at bedtime.  . clonazePAM (KLONOPIN) 0.5 MG tablet Take 1 tablet (0.5 mg total) by mouth daily as needed for anxiety.  . fosinopril (MONOPRIL) 40 MG tablet Take 40 mg by mouth every morning.   . gabapentin (NEURONTIN) 300 MG capsule Take 2 capsules (600 mg total) by mouth at bedtime.  . Ginger, Zingiber officinalis, (GINGER PO) Take by mouth.  . hydrochlorothiazide (HYDRODIURIL) 25 MG tablet Take 25 mg by mouth every morning.   . meclizine (ANTIVERT) 12.5 MG tablet Take 12.5 mg by mouth 3 (three) times daily as needed for dizziness. Dr. Eduard Clos  . meloxicam (MOBIC) 7.5 MG tablet Take 7.5 mg by mouth 2 (two) times daily.   . metoprolol succinate (TOPROL-XL) 50 MG 24 hr tablet Take 50 mg by mouth daily. Take with or immediately following a meal.  . Multiple Vitamin (MULTIVITAMIN) tablet Take  1 tablet by mouth every morning.   Marland Kitchen omeprazole (PRILOSEC) 20 MG capsule Take 20 mg by mouth 2 (two) times daily.   Marland Kitchen zolpidem (AMBIEN) 5 MG tablet Take 1 tablet (5 mg total) by mouth at bedtime.  . [DISCONTINUED] acetaminophen (TYLENOL) 500 MG tablet Take 1,500 mg by mouth 3 (three) times daily as needed for moderate pain.   . [DISCONTINUED] cephALEXin (KEFLEX) 500 MG capsule Take 500 mg by mouth 4 (four) times daily.  . [DISCONTINUED]  clonazePAM (KLONOPIN) 0.5 MG tablet Take 1 tablet (0.5 mg total) by mouth daily as needed for anxiety.  . [DISCONTINUED] clotrimazole-betamethasone (LOTRISONE) cream Apply topically. Dr. Excell Seltzer- foot doctor  . [DISCONTINUED] terbinafine (LAMISIL) 250 MG tablet Take 250 mg by mouth daily. Dr. Excell Seltzer   No facility-administered encounter medications on file as of 10/07/2020.    Allergies (verified) Sulfa antibiotics, Zithromax [azithromycin], Oxycodone, and Beef-derived products   History: Past Medical History:  Diagnosis Date  . Arthritis    osteo of left hip  . Asthma    humidity and exercise during summer  . GERD (gastroesophageal reflux disease)   . Hypertension    controlled on meds  . Meniere syndrome    hearing deaf right ear  . Neuromuscular disorder (HCC)    finger tips go numb, neck vertebrae  . Vertigo   . Wears dentures    full dentures on top, partial bottom   Past Surgical History:  Procedure Laterality Date  . ANTERIOR CERVICAL DECOMP/DISCECTOMY FUSION N/A 03/16/2018   Procedure: Anterior Cervical Discectomy Fusion - Cervical Three- Cervical Four, Cervical Four- Cervical Five, Cervical Five-Cervical Six, Cervical Six- Cerival Seven;  Surgeon: Donalee Citrin, MD;  Location: [facility] OR;  Service: Neurosurgery;  Laterality: N/A;  Anterior Cervical Discectomy Fusion - Cervical Three- Cervical Four, Cervical Four- Cervical Five, Cervical Five-Cervical Six, Cervical Six- Ceriva  . CARDIAC CATHETERIZATION     x2 both normal  . CARPAL TUNNEL RELEASE    . COLONOSCOPY    . COLONOSCOPY WITH PROPOFOL N/A 06/04/2016   Procedure: COLONOSCOPY WITH PROPOFOL;  Surgeon: Christena Deem, MD;  Location: [facility] ENDOSCOPY;  Service: Endoscopy;  Laterality: N/A;  . DILATION AND CURETTAGE OF UTERUS    . ECTOPIC PREGNANCY SURGERY    . ESOPHAGOGASTRODUODENOSCOPY (EGD) WITH PROPOFOL N/A 06/04/2016   Procedure: ESOPHAGOGASTRODUODENOSCOPY (EGD) WITH PROPOFOL;  Surgeon: Christena Deem, MD;  Location: [facility]  ENDOSCOPY;  Service: Endoscopy;  Laterality: N/A;  . exploratory laparoscopy     infertility surgery  . HALLUX VALGUS CORRECTION Bilateral   . JOINT REPLACEMENT Bilateral    partial knee  . KNEE ARTHROSCOPY Left 07/19/2015   Procedure: Arthroscopic limited synovectomy along with lateral compartment and retropatellar chondral debridement plus removal of several small loose bodies;  Surgeon: Erin Sons, MD;  Location: [facility];  Service: Orthopedics;  Laterality: Left;  1ST CASE PER CECE  . LUMBAR LAMINECTOMY/DECOMPRESSION MICRODISCECTOMY Right 04/29/2020   Procedure: Microdiscectomy - Lumbar Three-Lumbar Four - Lumbar Four-Lumbar Five - right;  Surgeon: Donalee Citrin, MD;  Location[facility] OR;  Service: Neurosurgery;  Laterality: Right;  Microdiscectomy - Lumbar Three-Lumbar Four - Lumbar Four-Lumbar Five - right  . MEDIAL PARTIAL KNEE REPLACEMENT Bilateral   . SHOULDER SURGERY Right   . TOTAL HIP ARTHROPLASTY Left 11/25/2015   Procedure: TOTAL HIP ARTHROPLASTY;  Surgeon: Donato Heinz, MD;  Location: [facility] ORS;  Service: Orthopedics;  Laterality: Left;  . TRIGGER FINGER RELEASE Bilateral    Family History  Problem Relation Age of  Onset  . Heart failure Mother   . COPD Mother   . Hypertension Mother   . Cancer Father   . Stroke Brother    Social History   Socioeconomic History  . Marital status: Married    Spouse name: Not on file  . Number of children: Not on file  . Years of education: Not on file  . Highest education level: Not on file  Occupational History  . Not on file  Tobacco Use  . Smoking status: Former Smoker    Packs/day: 1.50    Years: 30.00    Pack years: 45.00    Types: Cigarettes    Quit date: 11/12/1990    Years since quitting: 29.9  . Smokeless tobacco: Never Used  Vaping Use  . Vaping Use: Never used  Substance and Sexual Activity  . Alcohol use: Yes    Alcohol/week: 1.0 standard drink    Types: 1 Standard drinks or equivalent per week     Comment: 1 standard drink a week.   . Drug use: No  . Sexual activity: Not Currently  Other Topics Concern  . Not on file  Social History Narrative  . Not on file   Social Determinants of Health   Financial Resource Strain: Low Risk   . Difficulty of Paying Living Expenses: Not hard at all  Food Insecurity: No Food Insecurity  . Worried About Programme researcher, broadcasting/film/video in the Last Year: Never true  . Ran Out of Food in the Last Year: Never true  Transportation Needs: No Transportation Needs  . Lack of Transportation (Medical): No  . Lack of Transportation (Non-Medical): No  Physical Activity: Inactive  . Days of Exercise per Week: 0 days  . Minutes of Exercise per Session: 0 min  Stress: No Stress Concern Present  . Feeling of Stress : Not at all  Social Connections: Moderately Integrated  . Frequency of Communication with Friends and Family: More than three times a week  . Frequency of Social Gatherings with Friends and Family: Three times a week  . Attends Religious Services: Never  . Active Member of Clubs or Organizations: Yes  . Attends Banker Meetings: More than 4 times per year  . Marital Status: Married    Tobacco Counseling Counseling given: Not Answered   Clinical Intake:  Pre-visit preparation completed: Yes  Pain : 0-10 Pain Score: 4  Pain Type: Chronic pain Pain Location: Back Pain Orientation: Lower Pain Descriptors / Indicators: Aching,Sore Pain Onset: More than a month ago Pain Frequency: Constant     Nutritional Risks: None Diabetes: No  How often do you need to have someone help you when you read instructions, pamphlets, or other written materials from your doctor or pharmacy?: 1 - Never    Interpreter Needed?: No  Information entered by :: Katrina Littler LPN   Activities of Daily Living In your present state of health, do you have any difficulty performing the following activities: 10/07/2020 04/22/2020  Hearing? Malvin Johns  Comment  declines hearing aids deaf in right ear.  Vision? N N  Difficulty concentrating or making decisions? N N  Walking or climbing stairs? N N  Dressing or bathing? N N  Doing errands, shopping? N N  Preparing Food and eating ? N -  Using the Toilet? N -  In the past six months, have you accidently leaked urine? N -  Do you have problems with loss of bowel control? N -  Managing your Medications?  N -  Managing your Finances? N -  Housekeeping or managing your Housekeeping? N -  Some recent data might be hidden    Patient Care Team: Reubin Milan, MD as PCP - General (Internal Medicine) Donalee Citrin, MD as Consulting Physician (Neurosurgery) Pasty Arch (Inactive) as Referring Physician (Gastroenterology) Lonell Face, MD as Consulting Physician (Neurology) Ernest Pine, Illene Labrador, MD (Orthopedic Surgery)  Indicate any recent Medical Services you may have received from other than [facility] providers in the past year (date may be approximate).     Assessment:   This is a routine wellness examination for Krysta.  Hearing/Vision screen  Hearing Screening   125Hz  250Hz  500Hz  1000Hz  2000Hz  3000Hz  4000Hz  6000Hz  8000Hz   Right ear:           Left ear:           Comments: Pt states she is deaf in right ear but hearing okay in left ear despite constant white noise; does not tolerate hearing aids   Vision Screening Comments: Annual vision screenings done by Dr.  Dietary issues and exercise activities discussed: Current Exercise Habits: Home exercise routine, Type of exercise: Other - see comments (physical therapy), Exercise limited by: orthopedic condition(s);neurologic condition(s)  Goals    . Patient Stated     Patient states she would like to lose weight with healthy eating and physical activity after knee replacement in April 2022.       Depression Screen PHQ 2/9 Scores 10/07/2020 09/16/2020 08/16/2020  PHQ - 2 Score 0 0 0  PHQ- 9 Score - 0 0    Fall Risk Fall Risk  10/07/2020  09/16/2020 08/16/2020 08/16/2020  Falls in the past year? 1 1 1  0  Number falls in past yr: 0 0 0 -  Injury with Fall? 1 1 0 -  Risk for fall due to : History of fall(s);Impaired balance/gait - - -  Follow up Falls prevention discussed Falls evaluation completed Falls evaluation completed Falls evaluation completed    FALL RISK PREVENTION PERTAINING TO THE HOME:  Any stairs in or around the home? Yes  If so, are there any without handrails? No  Home free of loose throw rugs in walkways, pet beds, electrical cords, etc? Yes  Adequate lighting in your home to reduce risk of falls? Yes   ASSISTIVE DEVICES UTILIZED TO PREVENT FALLS:  Life alert? No  Use of a cane, walker or w/c? No  Grab bars in the bathroom? Yes  Shower chair or bench in shower? No  Elevated toilet seat or a handicapped toilet? Yes   TIMED UP AND GO:  Was the test performed? No . Telephonic visit.    Cognitive Function: Normal cognitive status assessed by direct observation by this Nurse Health Advisor. No abnormalities found.          Immunizations Immunization History  Administered Date(s) Administered  . Moderna Sars-Covid-2 Vaccination 11/19/2019, 12/18/2019, 08/21/2020  . Tdap 06/10/2019    TDAP status: Up to date  Flu Vaccine status: Declined, Education has been provided regarding the importance of this vaccine but patient still declined. Advised may receive this vaccine at local pharmacy or Health Dept. Aware to provide a copy of the vaccination record if obtained from local pharmacy or Health Dept. Verbalized acceptance and understanding.  Pneumococcal vaccine status: Up to date  Covid-19 vaccine status: Completed vaccines  Qualifies for Shingles Vaccine? Yes   Zostavax completed No   Shingrix Completed?: No.    Education has been  provided regarding the importance of this vaccine. Patient has been advised to call insurance company to determine out of pocket expense if they have not yet received  this vaccine. Advised may also receive vaccine at local pharmacy or Health Dept. Verbalized acceptance and understanding.  Screening Tests Health Maintenance  Topic Date Due  . Hepatitis C Screening  Never done  . PNA vac Low Risk Adult (1 of 2 - PCV13) Never done  . INFLUENZA VACCINE  01/02/2021 (Originally 05/05/2020)  . MAMMOGRAM  08/21/2021  . COLONOSCOPY (Pts 45-78yrs Insurance coverage will need to be confirmed)  06/04/2026  . TETANUS/TDAP  06/09/2029  . DEXA SCAN  Completed  . COVID-19 Vaccine  Completed    Health Maintenance  Health Maintenance Due  Topic Date Due  . Hepatitis C Screening  Never done  . PNA vac Low Risk Adult (1 of 2 - PCV13) Never done    Colorectal cancer screening: Type of screening: Colonoscopy. Completed 06/04/16. Repeat every 10 years  Mammogram status: Completed 08/22/19. Repeat every year. [facility]uled for 11/01/20 at [facility]  Bone Density status: Completed 05/03/19. Results reflect: Bone density results: OSTEOPENIA. Repeat every 2 years.  Lung Cancer Screening: (Low Dose CT Chest recommended if Age 54-80 years, 30 pack-year currently smoking OR have quit w/in 15years.) does not qualify.   Additional Screening:  Hepatitis C Screening: does qualify; postponed  Vision Screening: Recommended annual ophthalmology exams for early detection of glaucoma and other disorders of the eye. Is the patient up to date with their annual eye exam?  Yes  Who is the provider or what is the name of the office in which the patient attends annual eye exams? Dr. Ellin Mayhew  Dental Screening: Recommended annual dental exams for proper oral hygiene  Community Resource Referral / Chronic Care Management: CRR required this visit?  No   CCM required this visit?  No      Plan:     I have personally reviewed and noted the following in the patient's chart:   . Medical and social history . Use of alcohol, tobacco or illicit drugs  . Current medications and  supplements . Functional ability and status . Nutritional status . Physical activity . Advanced directives . List of other physicians . Hospitalizations, surgeries, and ER visits in previous 12 months . Vitals . Screenings to include cognitive, depression, and falls . Referrals and appointments  In addition, I have reviewed and discussed with patient certain preventive protocols, quality metrics, and best practice recommendations. A written personalized care plan for preventive services as well as general preventive health recommendations were provided to patient.     Katrina Marker, LPN   05/09/8849   Nurse Notes: none

## 2020-10-07 NOTE — Patient Instructions (Signed)
Katrina Poole , Thank you for taking time to come for your Medicare Wellness Visit. I appreciate your ongoing commitment to your health goals. Please review the following plan we discussed and let me know if I can assist you in the future.   Screening recommendations/referrals: Colonoscopy: done 06/04/16. Repeat in 2027 Mammogram: done 08/22/19; scheduled for 11/02/19 Bone Density: done 05/03/19 Recommended yearly ophthalmology/optometry visit for glaucoma screening and checkup Recommended yearly dental visit for hygiene and checkup  Vaccinations: Influenza vaccine: declined Pneumococcal vaccine: done; awaiting records  Tdap vaccine: done 06/10/19 Shingles vaccine: Shingrix discussed. Please contact your pharmacy for coverage information.  Covid-19: done 11/19/19, 12/18/19 & 08/21/20  Advanced directives: Please bring a copy of your health care power of attorney and living will to the office at your convenience.  Conditions/risks identified: Recommend healthy eating and physical activity as tolerated for desired weight loss  Next appointment: Follow up in one year for your annual wellness visit    Preventive Care 65 Years and Older, Female Preventive care refers to lifestyle choices and visits with your health care provider that can promote health and wellness. What does preventive care include?  A yearly physical exam. This is also called an annual well check.  Dental exams once or twice a year.  Routine eye exams. Ask your health care provider how often you should have your eyes checked.  Personal lifestyle choices, including:  Daily care of your teeth and gums.  Regular physical activity.  Eating a healthy diet.  Avoiding tobacco and drug use.  Limiting alcohol use.  Practicing safe sex.  Taking low-dose aspirin every day.  Taking vitamin and mineral supplements as recommended by your health care provider. What happens during an annual well check? The services and  screenings done by your health care provider during your annual well check will depend on your age, overall health, lifestyle risk factors, and family history of disease. Counseling  Your health care provider may ask you questions about your:  Alcohol use.  Tobacco use.  Drug use.  Emotional well-being.  Home and relationship well-being.  Sexual activity.  Eating habits.  History of falls.  Memory and ability to understand (cognition).  Work and work Astronomer.  Reproductive health. Screening  You may have the following tests or measurements:  Height, weight, and BMI.  Blood pressure.  Lipid and cholesterol levels. These may be checked every 5 years, or more frequently if you are over 36 years old.  Skin check.  Lung cancer screening. You may have this screening every year starting at age 53 if you have a 30-pack-year history of smoking and currently smoke or have quit within the past 15 years.  Fecal occult blood test (FOBT) of the stool. You may have this test every year starting at age 80.  Flexible sigmoidoscopy or colonoscopy. You may have a sigmoidoscopy every 5 years or a colonoscopy every 10 years starting at age 53.  Hepatitis C blood test.  Hepatitis B blood test.  Sexually transmitted disease (STD) testing.  Diabetes screening. This is done by checking your blood sugar (glucose) after you have not eaten for a while (fasting). You may have this done every 1-3 years.  Bone density scan. This is done to screen for osteoporosis. You may have this done starting at age 15.  Mammogram. This may be done every 1-2 years. Talk to your health care provider about how often you should have regular mammograms. Talk with your health care provider about your test  results, treatment options, and if necessary, the need for more tests. Vaccines  Your health care provider may recommend certain vaccines, such as:  Influenza vaccine. This is recommended every  year.  Tetanus, diphtheria, and acellular pertussis (Tdap, Td) vaccine. You may need a Td booster every 10 years.  Zoster vaccine. You may need this after age 61.  Pneumococcal 13-valent conjugate (PCV13) vaccine. One dose is recommended after age 69.  Pneumococcal polysaccharide (PPSV23) vaccine. One dose is recommended after age 93. Talk to your health care provider about which screenings and vaccines you need and how often you need them. This information is not intended to replace advice given to you by your health care provider. Make sure you discuss any questions you have with your health care provider. Document Released: 10/18/2015 Document Revised: 06/10/2016 Document Reviewed: 07/23/2015 Elsevier Interactive Patient Education  2017 [place] Prevention in the Home Falls can cause injuries. They can happen to people of all ages. There are many things you can do to make your home safe and to help prevent falls. What can I do on the outside of my home?  Regularly fix the edges of walkways and driveways and fix any cracks.  Remove anything that might make you trip as you walk through a door, such as a raised step or threshold.  Trim any bushes or trees on the path to your home.  Use bright outdoor lighting.  Clear any walking paths of anything that might make someone trip, such as rocks or tools.  Regularly check to see if handrails are loose or broken. Make sure that both sides of any steps have handrails.  Any raised decks and porches should have guardrails on the edges.  Have any leaves, snow, or ice cleared regularly.  Use sand or salt on walking paths during winter.  Clean up any spills in your garage right away. This includes oil or grease spills. What can I do in the bathroom?  Use night lights.  Install grab bars by the toilet and in the tub and shower. Do not use towel bars as grab bars.  Use non-skid mats or decals in the tub or shower.  If you  need to sit down in the shower, use a plastic, non-slip stool.  Keep the floor dry. Clean up any water that spills on the floor as soon as it happens.  Remove soap buildup in the tub or shower regularly.  Attach bath mats securely with double-sided non-slip rug tape.  Do not have throw rugs and other things on the floor that can make you trip. What can I do in the bedroom?  Use night lights.  Make sure that you have a light by your bed that is easy to reach.  Do not use any sheets or blankets that are too big for your bed. They should not hang down onto the floor.  Have a firm chair that has side arms. You can use this for support while you get dressed.  Do not have throw rugs and other things on the floor that can make you trip. What can I do in the kitchen?  Clean up any spills right away.  Avoid walking on wet floors.  Keep items that you use a lot in easy-to-reach places.  If you need to reach something above you, use a strong step stool that has a grab bar.  Keep electrical cords out of the way.  Do not use floor polish or wax that makes  floors slippery. If you must use wax, use non-skid floor wax.  Do not have throw rugs and other things on the floor that can make you trip. What can I do with my stairs?  Do not leave any items on the stairs.  Make sure that there are handrails on both sides of the stairs and use them. Fix handrails that are broken or loose. Make sure that handrails are as long as the stairways.  Check any carpeting to make sure that it is firmly attached to the stairs. Fix any carpet that is loose or worn.  Avoid having throw rugs at the top or bottom of the stairs. If you do have throw rugs, attach them to the floor with carpet tape.  Make sure that you have a light switch at the top of the stairs and the bottom of the stairs. If you do not have them, ask someone to add them for you. What else can I do to help prevent falls?  Wear shoes  that:  Do not have high heels.  Have rubber bottoms.  Are comfortable and fit you well.  Are closed at the toe. Do not wear sandals.  If you use a stepladder:  Make sure that it is fully opened. Do not climb a closed stepladder.  Make sure that both sides of the stepladder are locked into place.  Ask someone to hold it for you, if possible.  Clearly mark and make sure that you can see:  Any grab bars or handrails.  First and last steps.  Where the edge of each step is.  Use tools that help you move around (mobility aids) if they are needed. These include:  Canes.  Walkers.  Scooters.  Crutches.  Turn on the lights when you go into a dark area. Replace any light bulbs as soon as they burn out.  Set up your furniture so you have a clear path. Avoid moving your furniture around.  If any of your floors are uneven, fix them.  If there are any pets around you, be aware of where they are.  Review your medicines with your doctor. Some medicines can make you feel dizzy. This can increase your chance of falling. Ask your doctor what other things that you can do to help prevent falls. This information is not intended to replace advice given to you by your health care provider. Make sure you discuss any questions you have with your health care provider. Document Released: 07/18/2009 Document Revised: 02/27/2016 Document Reviewed: 10/26/2014 Elsevier Interactive Patient Education  2017 [employer].

## 2020-10-07 NOTE — Telephone Encounter (Signed)
Please review. Last office visit 09/16/2020.

## 2020-10-14 ENCOUNTER — Encounter: Payer: Self-pay | Admitting: Internal Medicine

## 2020-10-15 ENCOUNTER — Other Ambulatory Visit: Payer: Self-pay

## 2020-10-15 MED ORDER — OMEPRAZOLE 20 MG PO CPDR: 20.0000 mg | DELAYED_RELEASE_CAPSULE | Freq: Two times a day (BID) | ORAL | 1 refills | Status: DC

## 2020-10-15 MED ORDER — MELOXICAM 7.5 MG PO TABS: 7.5000 mg | ORAL_TABLET | Freq: Two times a day (BID) | ORAL | 1 refills | Status: DC

## 2020-10-27 ENCOUNTER — Encounter: Payer: Self-pay | Admitting: Internal Medicine

## 2020-10-28 ENCOUNTER — Other Ambulatory Visit: Payer: Self-pay

## 2020-10-28 MED ORDER — HYDROCHLOROTHIAZIDE 25 MG PO TABS: 25.0000 mg | ORAL_TABLET | ORAL | 1 refills | Status: DC

## 2020-11-23 ENCOUNTER — Encounter: Payer: Self-pay | Admitting: Internal Medicine

## 2020-11-23 ENCOUNTER — Other Ambulatory Visit: Payer: Self-pay | Admitting: Internal Medicine

## 2020-11-23 DIAGNOSIS — F5101 Primary insomnia: Secondary | ICD-10-CM

## 2020-11-23 NOTE — Telephone Encounter (Signed)
Requested medication (s) are due for refill today: Yes  Requested medication (s) are on the active medication list: Yes  Last refill:  08/16/20  Future visit scheduled: Yes  Notes to clinic:  Unable to refill per protocol, cannot delegate.      Requested Prescriptions  Pending Prescriptions Disp Refills   zolpidem (AMBIEN) 5 MG tablet [Pharmacy Med Name: Zolpidem Tartrate 5 MG Oral Tablet] 30 tablet 0    Sig: TAKE 1 TABLET BY MOUTH AT BEDTIME      Not Delegated - Psychiatry:  Anxiolytics/Hypnotics Failed - 11/23/2020 12:59 PM      Failed - This refill cannot be delegated      Failed - Urine Drug Screen completed in last 360 days      Passed - Valid encounter within last 6 months    Recent Outpatient Visits           2 months ago Restless leg syndrome   [facility] Reubin Milan, MD   3 months ago Essential hypertension   [facility] Reubin Milan, MD       Future Appointments             In 1 month Judithann Graves, Nyoka Cowden, MD [facility]

## 2020-11-24 ENCOUNTER — Other Ambulatory Visit: Payer: Self-pay | Admitting: Internal Medicine

## 2020-11-24 DIAGNOSIS — F5101 Primary insomnia: Secondary | ICD-10-CM

## 2020-11-24 MED ORDER — ZOLPIDEM TARTRATE 5 MG PO TABS
5.0000 mg | ORAL_TABLET | Freq: Every day | ORAL | 2 refills | Status: DC
Start: 2020-11-24 — End: 2021-02-26

## 2020-12-17 ENCOUNTER — Ambulatory Visit: Payer: Medicare Other | Admitting: Internal Medicine

## 2020-12-26 ENCOUNTER — Encounter: Payer: Self-pay | Admitting: Internal Medicine

## 2020-12-26 ENCOUNTER — Ambulatory Visit (INDEPENDENT_AMBULATORY_CARE_PROVIDER_SITE_OTHER): Payer: Medicare Other | Admitting: Internal Medicine

## 2020-12-26 ENCOUNTER — Other Ambulatory Visit: Payer: Self-pay

## 2020-12-26 VITALS — BP 130/70 | HR 61 | Temp 98.2°F | Ht 60.0 in | Wt 223.0 lb

## 2020-12-26 DIAGNOSIS — M1712 Unilateral primary osteoarthritis, left knee: Secondary | ICD-10-CM

## 2020-12-26 DIAGNOSIS — J4520 Mild intermittent asthma, uncomplicated: Secondary | ICD-10-CM

## 2020-12-26 DIAGNOSIS — I10 Essential (primary) hypertension: Secondary | ICD-10-CM | POA: Diagnosis not present

## 2020-12-26 NOTE — Progress Notes (Signed)
Date:  12/26/2020   Name:  Katrina Poole   DOB:  [birth year]   MRN:  [id number]   Chief Complaint: Hypertension  Hypertension This is a chronic problem. The problem is controlled. Pertinent negatives include no chest pain, headaches, palpitations or shortness of breath. Past treatments include diuretics, beta blockers, calcium channel blockers and angiotensin blockers. The current treatment provides significant improvement.  Asthma There is no cough, shortness of breath or wheezing. Associated symptoms include myalgias. Pertinent negatives include no chest pain or headaches. Her past medical history is significant for asthma.  Insomnia Primary symptoms: sleep disturbance (better with appropriate use of Ambien).  The problem has been gradually improving (since taking Ambien appropriately) since onset.    Lab Results  Component Value Date   CREATININE 0.95 04/22/2020   BUN 25 (H) 04/22/2020   NA 138 04/22/2020   K 3.9 04/22/2020   CL 103 04/22/2020   CO2 25 04/22/2020   Lab Results  Component Value Date   CHOL 200 04/02/2020   HDL 57 04/02/2020   LDLCALC 119 04/02/2020   TRIG 119 04/02/2020   No results found for: TSH No results found for: HGBA1C Lab Results  Component Value Date   WBC 5.9 04/22/2020   HGB 12.1 04/22/2020   HCT 37.5 04/22/2020   MCV 93.5 04/22/2020   PLT 307 04/22/2020   Lab Results  Component Value Date   ALT 16 08/06/2020   AST 18 08/06/2020     Review of Systems  Constitutional: Negative for fatigue and unexpected weight change.  HENT: Negative for nosebleeds.   Eyes: Negative for visual disturbance.  Respiratory: Negative for cough, chest tightness, shortness of breath and wheezing.   Cardiovascular: Negative for chest pain, palpitations and leg swelling.  Gastrointestinal: Negative for abdominal pain, constipation and diarrhea.  Musculoskeletal: Positive for arthralgias, gait problem and myalgias.  Neurological: Negative for  dizziness, weakness, light-headedness and headaches.  Psychiatric/Behavioral: Positive for sleep disturbance (better with appropriate use of Ambien). The patient has insomnia.     Patient Active Problem List   Diagnosis Date Noted  . Essential hypertension 08/16/2020  . Meniere's disease 08/16/2020  . Restless leg syndrome 08/16/2020  . Primary insomnia 08/16/2020  . Mild intermittent asthma without complication 08/16/2020  . Diverticulosis of colon 08/16/2020  . HNP (herniated nucleus pulposus), lumbar 04/29/2020  . Gastroesophageal reflux disease without esophagitis 01/24/2020  . Anemia of chronic disease 01/24/2020  . 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 Osteoarthritis of knee 02/06/2018  . S/P total hip arthroplasty 11/25/2015  . Status post arthroscopic surgery of left knee 08/15/2015    Allergies  Allergen Reactions  . Sulfa Antibiotics Other (See Comments)    Hallucinations  . Zithromax [Azithromycin] Hives  . Oxycodone Other (See Comments)    Makes head foggy and interferes with Meniere's syndrome.    . Beef-Derived Products Other (See Comments)    Tears up my stomach.    Past Surgical History:  Procedure Laterality Date  . ANTERIOR CERVICAL DECOMP/DISCECTOMY FUSION N/A 03/16/2018   Procedure: Anterior Cervical Discectomy Fusion - Cervical Three- Cervical Four, Cervical Four- Cervical Five, Cervical Five-Cervical Six, Cervical Six- Cerival Seven;  Surgeon: Donalee Citrin, MD;  Location: [facility] OR;  Service: Neurosurgery;  Laterality: N/A;  Anterior Cervical Discectomy Fusion - Cervical Three- Cervical Four, Cervical Four- Cervical Five, Cervical Five-Cervical Six, Cervical Six- Ceriva  . CARDIAC CATHETERIZATION     x2 both normal  . CARPAL TUNNEL RELEASE    .  COLONOSCOPY    . COLONOSCOPY WITH PROPOFOL N/A 06/04/2016   Procedure: COLONOSCOPY WITH PROPOFOL;  Surgeon: Christena Deem, MD;  Location: [facility] ENDOSCOPY;  Service: Endoscopy;  Laterality: N/A;  . DILATION AND  CURETTAGE OF UTERUS    . ECTOPIC PREGNANCY SURGERY    . ESOPHAGOGASTRODUODENOSCOPY (EGD) WITH PROPOFOL N/A 06/04/2016   Procedure: ESOPHAGOGASTRODUODENOSCOPY (EGD) WITH PROPOFOL;  Surgeon: Christena Deem, MD;  Location: [facility] ENDOSCOPY;  Service: Endoscopy;  Laterality: N/A;  . exploratory laparoscopy     infertility surgery  . HALLUX VALGUS CORRECTION Bilateral   . JOINT REPLACEMENT Bilateral    partial knee  . KNEE ARTHROSCOPY Left 07/19/2015   Procedure: Arthroscopic limited synovectomy along with lateral compartment and retropatellar chondral debridement plus removal of several small loose bodies;  Surgeon: Erin Sons, MD;  Location[facility];  Service: Orthopedics;  Laterality: Left;  1ST CASE PER CECE  . LUMBAR LAMINECTOMY/DECOMPRESSION MICRODISCECTOMY Right 04/29/2020   Procedure: Microdiscectomy - Lumbar Three-Lumbar Four - Lumbar Four-Lumbar Five - right;  Surgeon: Donalee Citrin, MD;  Location: [facility] OR;  Service: Neurosurgery;  Laterality: Right;  Microdiscectomy - Lumbar Three-Lumbar Four - Lumbar Four-Lumbar Five - right  . MEDIAL PARTIAL KNEE REPLACEMENT Bilateral   . SHOULDER SURGERY Right   . TOTAL HIP ARTHROPLASTY Left 11/25/2015   Procedure: TOTAL HIP ARTHROPLASTY;  Surgeon: Donato Heinz, MD;  Location: [facility] ORS;  Service: Orthopedics;  Laterality: Left;  . TRIGGER FINGER RELEASE Bilateral     Social History   Tobacco Use  . Smoking status: Former Smoker    Packs/day: 1.50    Years: 30.00    Pack years: 45.00    Types: Cigarettes    Quit date: 11/12/1990    Years since quitting: 30.1  . Smokeless tobacco: Never Used  Vaping Use  . Vaping Use: Never used  Substance Use Topics  . Alcohol use: Yes    Alcohol/week: 1.0 standard drink    Types: 1 Standard drinks or equivalent per week    Comment: 1 standard drink a week.   . Drug use: No     Medication list has been reviewed and updated.  Current Meds  Medication Sig  . albuterol (VENTOLIN HFA) 108  (90 Base) MCG/ACT inhaler Inhale 2 puffs into the lungs every 6 (six) hours as needed for wheezing or shortness of breath.  Marland Kitchen alum hydroxide-mag trisilicate (GAVISCON) 80-20 MG CHEW chewable tablet Chew 1 tablet by mouth daily as needed for indigestion or heartburn.   Marland Kitchen amLODipine (NORVASC) 5 MG tablet Take 5 mg by mouth every morning.   . cholecalciferol (VITAMIN D3) 25 MCG (1000 UNIT) tablet Take 1,000 Units by mouth at bedtime.  . clonazePAM (KLONOPIN) 0.5 MG tablet Take 1 tablet (0.5 mg total) by mouth daily as needed for anxiety.  . fosinopril (MONOPRIL) 40 MG tablet Take 40 mg by mouth every morning.   . gabapentin (NEURONTIN) 300 MG capsule Take 2 capsules (600 mg total) by mouth at bedtime.  . hydrochlorothiazide (HYDRODIURIL) 25 MG tablet Take 1 tablet (25 mg total) by mouth every morning.  . meclizine (ANTIVERT) 12.5 MG tablet Take 12.5 mg by mouth 3 (three) times daily as needed for dizziness. Dr. Eduard Clos  . meloxicam (MOBIC) 7.5 MG tablet Take 1 tablet (7.5 mg total) by mouth 2 (two) times daily.  . metoprolol succinate (TOPROL-XL) 50 MG 24 hr tablet Take 50 mg by mouth daily. Take with or immediately following a meal.  . Multiple Vitamin (MULTIVITAMIN) tablet  Take 1 tablet by mouth every morning.   Marland Kitchen omeprazole (PRILOSEC) 20 MG capsule Take 1 capsule (20 mg total) by mouth 2 (two) times daily before a meal.  . zolpidem (AMBIEN) 5 MG tablet Take 1 tablet (5 mg total) by mouth at bedtime.    PHQ 2/9 Scores 12/26/2020 10/07/2020 09/16/2020 08/16/2020  PHQ - 2 Score 0 0 0 0  PHQ- 9 Score 0 - 0 0    GAD 7 : Generalized Anxiety Score 12/26/2020 09/16/2020 08/16/2020  Nervous, Anxious, on Edge 0 2 0  Control/stop worrying 0 0 0  Worry too much - different things 0 0 0  Trouble relaxing 0 0 0  Restless 0 0 0  Easily annoyed or irritable 0 0 0  Afraid - awful might happen 0 0 0  Total GAD 7 Score 0 2 0    BP Readings from Last 3 Encounters:  12/26/20 130/70  09/16/20 138/76  08/16/20  120/70    Physical Exam Vitals and nursing note reviewed.  Constitutional:      General: She is not in acute distress.    Appearance: She is well-developed.  HENT:     Head: Normocephalic and atraumatic.  Cardiovascular:     Rate and Rhythm: Normal rate and regular rhythm.     Pulses: Normal pulses.  Pulmonary:     Effort: Pulmonary effort is normal. No respiratory distress.     Breath sounds: No wheezing or rhonchi.  Musculoskeletal:     Cervical back: Normal range of motion.     Right lower leg: Edema (trace) present.     Left lower leg: Edema (trace) present.  Lymphadenopathy:     Cervical: No cervical adenopathy.  Skin:    General: Skin is warm and dry.     Findings: No rash.  Neurological:     Mental Status: She is alert and oriented to person, place, and time.  Psychiatric:        Mood and Affect: Mood normal.        Behavior: Behavior normal.     Wt Readings from Last 3 Encounters:  12/26/20 223 lb (101.2 kg)  09/16/20 223 lb (101.2 kg)  08/16/20 223 lb (101.2 kg)    BP 130/70   Pulse 61   Temp 98.2 F (36.8 C) (Oral)   Ht 5' (1.524 m)   Wt 223 lb (101.2 kg)   SpO2 93%   BMI 43.55 kg/m   Assessment and Plan: 1. Essential hypertension Clinically stable exam with well controlled BP on four medications. Tolerating medications without side effects at this time. Pt to continue current regimen and low sodium diet; benefits of regular exercise as able discussed. Consider stopping amlodipine due to mild edema  2. Primary osteoarthritis of left knee Planning surgery in April  3. Mild intermittent asthma without complication Asthma is well controlled on albuterol PRN without controller medication.   Partially dictated using Animal nutritionist. Any errors are unintentional.  Bari Edward, MD [facility]  12/26/2020

## 2020-12-29 ENCOUNTER — Encounter: Payer: Self-pay | Admitting: Internal Medicine

## 2020-12-30 ENCOUNTER — Encounter: Payer: Self-pay | Admitting: Internal Medicine

## 2020-12-30 ENCOUNTER — Other Ambulatory Visit: Payer: Self-pay | Admitting: Internal Medicine

## 2020-12-30 DIAGNOSIS — F411 Generalized anxiety disorder: Secondary | ICD-10-CM

## 2020-12-30 MED ORDER — CLONAZEPAM 0.5 MG PO TABS: 0.5000 mg | ORAL_TABLET | Freq: Every day | ORAL | 2 refills | Status: DC | PRN

## 2021-01-16 NOTE — Discharge Instructions (Signed)
Instructions after Total Knee Replacement   Adams Hinch P. Bralin Garry, Jr., M.D.     Dept. of Orthopaedics & Sports Medicine  [facility]  [address]  [place], [place]  [address]  Phone: [phone]   Fax: [phone]    DIET: Drink plenty of non-alcoholic fluids. Resume your normal diet. Include foods high in fiber.  ACTIVITY:  You may use crutches or a walker with weight-bearing as tolerated, unless instructed otherwise. You may be weaned off of the walker or crutches by your Physical Therapist.  Do NOT place pillows under the knee. Anything placed under the knee could limit your ability to straighten the knee.   Continue doing gentle exercises. Exercising will reduce the pain and swelling, increase motion, and prevent muscle weakness.   Please continue to use the TED compression stockings for 6 weeks. You may remove the stockings at night, but should reapply them in the morning. Do not drive or operate any equipment until instructed.  WOUND CARE:  Continue to use the PolarCare or ice packs periodically to reduce pain and swelling. You may bathe or shower after the staples are removed at the first office visit following surgery.  MEDICATIONS: You may resume your regular medications. Please take the pain medication as prescribed on the medication. Do not take pain medication on an empty stomach. You have been given a prescription for a blood thinner (Lovenox or Coumadin). Please take the medication as instructed. (NOTE: After completing a 2 week course of Lovenox, take one Enteric-coated aspirin once a day. This along with elevation will help reduce the possibility of phlebitis in your operated leg.) Do not drive or drink alcoholic beverages when taking pain medications.  CALL THE OFFICE FOR: Temperature above 101 degrees Excessive bleeding or drainage on the dressing. Excessive swelling, coldness, or paleness of the toes. Persistent nausea and vomiting.  FOLLOW-UP:  You  should have an appointment to return to the office in 10-14 days after surgery. Arrangements have been made for continuation of Physical Therapy (either home therapy or outpatient therapy).   [facility] Department Directory         [email or web address]       [email or web address]          Cardiology  Appointments: [place] - [phone] [place] - [phone]  Endocrinology  Appointments: [place] - [phone] [place] - [phone]  Gastroenterology  Appointments: [place] - [phone] [place] - [phone]        General Surgery   Appointments: [place] - [phone]  Internal Medicine/Family Medicine  Appointments: [place] - [phone] [place] - [phone] [place] - [phone]  Metabolic and Weigh Loss Surgery  Appointments: [place] - [phone]        Neurology  Appointments: [place] - [phone] [place] - [phone]  Neurosurgery  Appointments: [place] - [phone]  Obstetrics & Gynecology  Appointments: [place] - [phone] [place] - [phone]        Pediatrics  Appointments: [place] - [phone] [place] - [phone]  Physiatry  Appointments: [place] -[phone]  Physical Therapy  Appointments: [place] - [phone] [place] - [phone]        Podiatry  Appointments: [place] - [phone] [place] - [phone]  Pulmonology  Appointments: [place] - [phone]  Rheumatology  Appointments: [place] - [phone]        [place] Location: [facility]  [address] , [place]  [address]  [place] Location: [facility] [address] [place], [place]  [address]  [place] Location: [facility] [address] [place], [place]  [address]    

## 2021-01-20 ENCOUNTER — Encounter: Payer: Self-pay | Admitting: Internal Medicine

## 2021-01-20 ENCOUNTER — Other Ambulatory Visit: Payer: Medicare Other

## 2021-01-22 ENCOUNTER — Encounter
Admission: RE | Admit: 2021-01-22 | Discharge: 2021-01-22 | Disposition: A | Discharge status: Home / Self Care | Payer: Medicare Other | Source: Ambulatory Visit | Attending: Orthopedic Surgery | Admitting: Orthopedic Surgery

## 2021-01-22 ENCOUNTER — Other Ambulatory Visit: Payer: Self-pay

## 2021-01-22 DIAGNOSIS — Z01818 Encounter for other preprocedural examination: Secondary | ICD-10-CM | POA: Diagnosis not present

## 2021-01-22 HISTORY — DX: Restless legs syndrome: G25.81

## 2021-01-22 LAB — URINALYSIS, ROUTINE W REFLEX MICROSCOPIC
Bilirubin Urine: NEGATIVE
Glucose, UA: NEGATIVE mg/dL
Hgb urine dipstick: NEGATIVE
Ketones, ur: NEGATIVE mg/dL
Leukocytes,Ua: NEGATIVE
Nitrite: NEGATIVE
Protein, ur: NEGATIVE mg/dL
Specific Gravity, Urine: 1.016 (ref 1.005–1.030)
pH: 5 (ref 5.0–8.0)

## 2021-01-22 LAB — COMPREHENSIVE METABOLIC PANEL
ALT: 18 U/L (ref 0–44)
AST: 19 U/L (ref 15–41)
Albumin: 4.3 g/dL (ref 3.5–5.0)
Alkaline Phosphatase: 86 U/L (ref 38–126)
Anion gap: 12 (ref 5–15)
BUN: 21 mg/dL (ref 8–23)
CO2: 25 mmol/L (ref 22–32)
Calcium: 9.3 mg/dL (ref 8.9–10.3)
Chloride: 98 mmol/L (ref 98–111)
Creatinine, Ser: 0.86 mg/dL (ref 0.44–1.00)
GFR, Estimated: >60 mL/min (ref >60)
Glucose, Bld: 98 mg/dL (ref 70–99)
Potassium: 3.7 mmol/L (ref 3.5–5.1)
Sodium: 135 mmol/L (ref 135–145)
Total Bilirubin: 0.7 mg/dL (ref 0.3–1.2)
Total Protein: 7.4 g/dL (ref 6.5–8.1)

## 2021-01-22 LAB — CBC
HCT: 37.4 % (ref 36.0–46.0)
Hemoglobin: 12.5 g/dL (ref 12.0–15.0)
MCH: 30.4 pg (ref 26.0–34.0)
MCHC: 33.4 g/dL (ref 30.0–36.0)
MCV: 91 fL (ref 80.0–100.0)
Platelets: 295 10*3/uL (ref 150–400)
RBC: 4.11 MIL/uL (ref 3.87–5.11)
RDW: 13.3 % (ref 11.5–15.5)
WBC: 7.4 10*3/uL (ref 4.0–10.5)
nRBC: 0 % (ref 0.0–0.2)

## 2021-01-22 LAB — TYPE AND SCREEN
ABO/RH(D): A POS
Antibody Screen: NEGATIVE

## 2021-01-22 LAB — SEDIMENTATION RATE: Sed Rate: 35 mm/hr — ABNORMAL HIGH (ref 0–30)

## 2021-01-22 LAB — PROTIME-INR
INR: 1.1 (ref 0.8–1.2)
Prothrombin Time: 13.7 seconds (ref 11.4–15.2)

## 2021-01-22 LAB — SURGICAL PCR SCREEN
MRSA, PCR: NEGATIVE
Staphylococcus aureus: NEGATIVE

## 2021-01-22 LAB — C-REACTIVE PROTEIN: CRP: 1 mg/dL — ABNORMAL HIGH (ref <1.0)

## 2021-01-22 LAB — APTT: aPTT: 29 seconds (ref 24–36)

## 2021-01-22 NOTE — Patient Instructions (Addendum)
Your procedure is scheduled on:01-27-21 MONDAY Report to the Registration Desk on the 1st floor of the [facility]-Then proceed to the 2nd floor Surgery Desk in the Medical [facility] To find out your arrival time, please call [phone] between 1PM - 3PM on:01-24-21 FRIDAY  REMEMBER: Instructions that are not followed completely may result in serious medical risk, up to and including death; or upon the discretion of your surgeon and anesthesiologist your surgery may need to be rescheduled.  Do not eat food after midnight the night before surgery.  No gum chewing, lozengers or hard candies.  You may however, drink CLEAR liquids up to 2 hours before you are scheduled to arrive for your surgery. Do not drink anything within 2 hours of your scheduled arrival time.  Clear liquids include: - water  - apple juice without pulp - gatorade - black coffee or tea (Do NOT add milk or creamers to the coffee or tea) Do NOT drink anything that is not on this list  In addition, your doctor has ordered for you to drink the provided  Ensure Pre-Surgery Clear Carbohydrate Drink  Drinking this carbohydrate drink up to two hours before surgery helps to reduce insulin resistance and improve patient outcomes. Please complete drinking 2 hours prior to scheduled arrival time.  TAKE THESE MEDICATIONS THE MORNING OF SURGERY WITH A SIP OF WATER: -NORVASC (AMLODIPINE) -METOPROLOL (TOPROL) -PRILOSEC (OMEPRAZOLE)  USE YOUR ALBUTEROL INHALER THE MORNING OF SURGERY AND BRING ALBUTEROL INHALER TO HOSPITAL  One week prior to surgery: Stop Anti-inflammatories (NSAIDS) such as MOBIC (MELOXICAM), Advil, Aleve, Ibuprofen, Motrin, Naproxen, Naprosyn and Aspirin based products such as Excedrin, Goodys Powder, BC Powder-OK TO TAKE TYLENOL IF NEEDED  Stop ANY OVER THE COUNTER supplements until after surgery-HOWEVER, YOU MAY CONTINUE YOUR MULTIVITAMIN AND VITAMIN D3 UP UNTIL THE DAY PRIOR TO SURGERY  No Alcohol for 24 hours  before or after surgery.  No Smoking including e-cigarettes for 24 hours prior to surgery.  No chewable tobacco products for at least 6 hours prior to surgery.  No nicotine patches on the day of surgery.  Do not use any "recreational" drugs for at least a week prior to your surgery.  Please be advised that the combination of cocaine and anesthesia may have negative outcomes, up to and including death. If you test positive for cocaine, your surgery will be cancelled.  On the morning of surgery brush your teeth with toothpaste and water, you may rinse your mouth with mouthwash if you wish. Do not swallow any toothpaste or mouthwash.  Do not wear jewelry, make-up, hairpins, clips or nail polish.  Do not wear lotions, powders, or perfumes.   Do not shave body from the neck down 48 hours prior to surgery just in case you cut yourself which could leave a site for infection.  Also, freshly shaved skin may become irritated if using the CHG soap.  Contact lenses, hearing aids and dentures may not be worn into surgery.  Do not bring valuables to the hospital. [facility] is not responsible for any missing/lost belongings or valuables.   Use CHG Soap as directed on instruction sheet.  Notify your doctor if there is any change in your medical condition (cold, fever, infection).  Wear comfortable clothing (specific to your surgery type) to the hospital.  Plan for stool softeners for home use; pain medications have a tendency to cause constipation. You can also help prevent constipation by eating foods high in fiber such as fruits and vegetables and  drinking plenty of fluids as your diet allows.  After surgery, you can help prevent lung complications by doing breathing exercises.  Take deep breaths and cough every 1-2 hours. Your doctor may order a device called an Incentive Spirometer to help you take deep breaths. When coughing or sneezing, hold a pillow firmly against your incision with both  hands. This is called "splinting." Doing this helps protect your incision. It also decreases belly discomfort.  If you are being admitted to the hospital overnight, leave your suitcase in the car. After surgery it may be brought to your room.  If you are being discharged the day of surgery, you will not be allowed to drive home. You will need a responsible adult (18 years or older) to drive you home and stay with you that night.   If you are taking public transportation, you will need to have a responsible adult (18 years or older) with you. Please confirm with your physician that it is acceptable to use public transportation.   Please call the Pre-admissions Testing Dept. at [phone] if you have any questions about these instructions.  Surgery Visitation Policy:  Patients undergoing a surgery or procedure may have one family member or support person with them as long as that person is not COVID-19 positive or experiencing its symptoms.  That person may remain in the waiting area during the procedure.  Inpatient Visitation:    Visiting hours are 7 a.m. to 8 p.m. Inpatients will be allowed two visitors daily. The visitors may change each day during the patient's stay. No visitors under the age of 46. Any visitor under the age of 8 must be accompanied by an adult. The visitor must pass COVID-19 screenings, use hand sanitizer when entering and exiting the patient's room and wear a mask at all times, including in the patient's room. Patients must also wear a mask when staff or their visitor are in the room. Masking is required regardless of vaccination status.

## 2021-01-24 ENCOUNTER — Other Ambulatory Visit
Admission: RE | Admit: 2021-01-24 | Discharge: 2021-01-24 | Disposition: A | Discharge status: Home / Self Care | Payer: Medicare Other | Source: Ambulatory Visit | Attending: Orthopedic Surgery | Admitting: Orthopedic Surgery

## 2021-01-24 ENCOUNTER — Other Ambulatory Visit: Payer: Self-pay

## 2021-01-24 DIAGNOSIS — Z01812 Encounter for preprocedural laboratory examination: Secondary | ICD-10-CM | POA: Insufficient documentation

## 2021-01-24 DIAGNOSIS — Z20822 Contact with and (suspected) exposure to covid-19: Secondary | ICD-10-CM | POA: Diagnosis not present

## 2021-01-24 LAB — URINE CULTURE
Culture: <10000 — AB
Special Requests: NORMAL

## 2021-01-24 LAB — SARS CORONAVIRUS 2 (TAT 6-24 HRS): SARS Coronavirus 2: NEGATIVE

## 2021-01-27 ENCOUNTER — Observation Stay: Payer: Medicare Other

## 2021-01-27 ENCOUNTER — Observation Stay
Admission: RE | Admit: 2021-01-27 | Discharge: 2021-01-28 | Disposition: A | Discharge status: Home / Self Care | Payer: Medicare Other | Attending: Orthopedic Surgery | Admitting: Orthopedic Surgery

## 2021-01-27 ENCOUNTER — Encounter: Payer: Self-pay | Admitting: Orthopedic Surgery

## 2021-01-27 ENCOUNTER — Other Ambulatory Visit: Payer: Self-pay

## 2021-01-27 ENCOUNTER — Ambulatory Visit: Payer: Medicare Other | Admitting: Anesthesiology

## 2021-01-27 ENCOUNTER — Encounter
Admission: RE | Disposition: A | Discharge status: Home / Self Care | Payer: Self-pay | Source: Home / Self Care | Attending: Orthopedic Surgery

## 2021-01-27 DIAGNOSIS — M1712 Unilateral primary osteoarthritis, left knee: Secondary | ICD-10-CM | POA: Diagnosis not present

## 2021-01-27 DIAGNOSIS — N189 Chronic kidney disease, unspecified: Secondary | ICD-10-CM | POA: Diagnosis not present

## 2021-01-27 DIAGNOSIS — Z79899 Other long term (current) drug therapy: Secondary | ICD-10-CM | POA: Diagnosis not present

## 2021-01-27 DIAGNOSIS — Z96659 Presence of unspecified artificial knee joint: Secondary | ICD-10-CM | POA: Diagnosis present

## 2021-01-27 DIAGNOSIS — I129 Hypertensive chronic kidney disease with stage 1 through stage 4 chronic kidney disease, or unspecified chronic kidney disease: Secondary | ICD-10-CM | POA: Insufficient documentation

## 2021-01-27 DIAGNOSIS — J45909 Unspecified asthma, uncomplicated: Secondary | ICD-10-CM | POA: Insufficient documentation

## 2021-01-27 DIAGNOSIS — Z96642 Presence of left artificial hip joint: Secondary | ICD-10-CM | POA: Diagnosis not present

## 2021-01-27 DIAGNOSIS — M25562 Pain in left knee: Secondary | ICD-10-CM | POA: Diagnosis present

## 2021-01-27 DIAGNOSIS — Z87891 Personal history of nicotine dependence: Secondary | ICD-10-CM | POA: Diagnosis not present

## 2021-01-27 HISTORY — PX: KNEE ARTHROPLASTY: SHX992

## 2021-01-27 HISTORY — DX: Presence of unspecified artificial knee joint: Z96.659

## 2021-01-27 LAB — GRAM STAIN: Gram Stain: NONE SEEN

## 2021-01-27 SURGERY — ARTHROPLASTY, KNEE, TOTAL, USING IMAGELESS COMPUTER-ASSISTED NAVIGATION
Anesthesia: Spinal | Site: Knee | Laterality: Left

## 2021-01-27 MED ORDER — PROPOFOL 10 MG/ML IV BOLUS
INTRAVENOUS | Status: DC | PRN
  Administered 2021-01-27: 50 mg via INTRAVENOUS
  Administered 2021-01-27: 30 mg via INTRAVENOUS

## 2021-01-27 MED ORDER — MAGNESIUM HYDROXIDE 400 MG/5ML PO SUSP
30.0000 mL | Freq: Every day | ORAL | Status: DC
  Filled 2021-01-27: qty 30

## 2021-01-27 MED ORDER — DEXAMETHASONE SODIUM PHOSPHATE 10 MG/ML IJ SOLN
8.0000 mg | Freq: Once | INTRAMUSCULAR | Status: AC
Start: 2021-01-27 — End: 2021-01-27

## 2021-01-27 MED ORDER — METOPROLOL SUCCINATE ER 50 MG PO TB24
50.0000 mg | ORAL_TABLET | Freq: Every day | ORAL | Status: DC
  Filled 2021-01-27: qty 1

## 2021-01-27 MED ORDER — AMLODIPINE BESYLATE 5 MG PO TABS
5.0000 mg | ORAL_TABLET | Freq: Every day | ORAL | Status: DC
  Administered 2021-01-28: 5 mg via ORAL
  Filled 2021-01-27: qty 1

## 2021-01-27 MED ORDER — BUPIVACAINE HCL (PF) 0.25 % IJ SOLN
INTRAMUSCULAR | Status: DC | PRN
  Administered 2021-01-27: 60 mL

## 2021-01-27 MED ORDER — TRANEXAMIC ACID-NACL 1000-0.7 MG/100ML-% IV SOLN
INTRAVENOUS | Status: AC
  Filled 2021-01-27: qty 100

## 2021-01-27 MED ORDER — ALUM HYDROXIDE-MAG TRISILICATE 80-20 MG PO CHEW: 1.0000 | CHEWABLE_TABLET | Freq: Every day | ORAL | Status: DC | PRN

## 2021-01-27 MED ORDER — TRANEXAMIC ACID-NACL 1000-0.7 MG/100ML-% IV SOLN: 1000.0000 mg | INTRAVENOUS | Status: AC

## 2021-01-27 MED ORDER — PHENOL 1.4 % MT LIQD
1.0000 | OROMUCOSAL | Status: DC | PRN
  Filled 2021-01-27: qty 177

## 2021-01-27 MED ORDER — MECLIZINE HCL 12.5 MG PO TABS
12.5000 mg | ORAL_TABLET | Freq: Three times a day (TID) | ORAL | Status: DC | PRN
  Filled 2021-01-27: qty 1

## 2021-01-27 MED ORDER — METOCLOPRAMIDE HCL 10 MG PO TABS
10.0000 mg | ORAL_TABLET | Freq: Three times a day (TID) | ORAL | Status: DC
  Administered 2021-01-27 – 2021-01-28 (×3): 10 mg via ORAL
  Filled 2021-01-27 (×4): qty 1

## 2021-01-27 MED ORDER — CELECOXIB 200 MG PO CAPS
200.0000 mg | ORAL_CAPSULE | Freq: Two times a day (BID) | ORAL | Status: DC
  Administered 2021-01-27 – 2021-01-28 (×2): 200 mg via ORAL
  Filled 2021-01-27 (×2): qty 1

## 2021-01-27 MED ORDER — GABAPENTIN 300 MG PO CAPS: 300.0000 mg | ORAL_CAPSULE | Freq: Once | ORAL | Status: AC

## 2021-01-27 MED ORDER — ONDANSETRON HCL 4 MG PO TABS: 4.0000 mg | ORAL_TABLET | Freq: Four times a day (QID) | ORAL | Status: DC | PRN

## 2021-01-27 MED ORDER — ACETAMINOPHEN 10 MG/ML IV SOLN
INTRAVENOUS | Status: AC
  Filled 2021-01-27: qty 100

## 2021-01-27 MED ORDER — FLEET ENEMA 7-19 GM/118ML RE ENEM: 1.0000 | ENEMA | Freq: Once | RECTAL | Status: DC | PRN

## 2021-01-27 MED ORDER — ONDANSETRON HCL 4 MG/2ML IJ SOLN
4.0000 mg | Freq: Once | INTRAMUSCULAR | Status: DC | PRN
Start: 2021-01-27 — End: 2021-01-27

## 2021-01-27 MED ORDER — SODIUM CHLORIDE 0.9 % IV SOLN: INTRAVENOUS | Status: DC

## 2021-01-27 MED ORDER — ACETAMINOPHEN 10 MG/ML IV SOLN
INTRAVENOUS | Status: DC | PRN
  Administered 2021-01-27: 1000 mg via INTRAVENOUS

## 2021-01-27 MED ORDER — ENSURE PRE-SURGERY PO LIQD
296.0000 mL | Freq: Once | ORAL | Status: DC
  Filled 2021-01-27: qty 296

## 2021-01-27 MED ORDER — FENTANYL CITRATE (PF) 100 MCG/2ML IJ SOLN
INTRAMUSCULAR | Status: DC | PRN
  Administered 2021-01-27 (×4): 50 ug via INTRAVENOUS

## 2021-01-27 MED ORDER — FOSINOPRIL SODIUM 20 MG PO TABS: 40.0000 mg | ORAL_TABLET | ORAL | Status: DC

## 2021-01-27 MED ORDER — DIPHENHYDRAMINE HCL 12.5 MG/5ML PO ELIX: 12.5000 mg | ORAL_SOLUTION | ORAL | Status: DC | PRN

## 2021-01-27 MED ORDER — CEFAZOLIN SODIUM-DEXTROSE 2-4 GM/100ML-% IV SOLN
INTRAVENOUS | Status: AC
  Administered 2021-01-28: 2 g via INTRAVENOUS
  Filled 2021-01-27: qty 100

## 2021-01-27 MED ORDER — SUGAMMADEX SODIUM 200 MG/2ML IV SOLN
INTRAVENOUS | Status: DC | PRN
  Administered 2021-01-27: 200 mg via INTRAVENOUS

## 2021-01-27 MED ORDER — CLONAZEPAM 0.5 MG PO TABS
0.5000 mg | ORAL_TABLET | Freq: Every day | ORAL | Status: DC
  Administered 2021-01-27: 0.5 mg via ORAL
  Filled 2021-01-27: qty 1

## 2021-01-27 MED ORDER — DEXAMETHASONE SODIUM PHOSPHATE 10 MG/ML IJ SOLN
INTRAMUSCULAR | Status: AC
  Administered 2021-01-27: 8 mg via INTRAVENOUS
  Filled 2021-01-27: qty 1

## 2021-01-27 MED ORDER — TRAMADOL HCL 50 MG PO TABS
50.0000 mg | ORAL_TABLET | ORAL | Status: DC | PRN
  Administered 2021-01-27 – 2021-01-28 (×4): 50 mg via ORAL
  Filled 2021-01-27: qty 1
  Filled 2021-01-27: qty 2
  Filled 2021-01-27 (×2): qty 1

## 2021-01-27 MED ORDER — DEXMEDETOMIDINE (PRECEDEX) IN NS 20 MCG/5ML (4 MCG/ML) IV SYRINGE
PREFILLED_SYRINGE | INTRAVENOUS | Status: DC | PRN
  Administered 2021-01-27: 12 ug via INTRAVENOUS

## 2021-01-27 MED ORDER — ADULT MULTIVITAMIN W/MINERALS CH
1.0000 | ORAL_TABLET | ORAL | Status: DC
  Administered 2021-01-28: 1 via ORAL
  Filled 2021-01-27: qty 1

## 2021-01-27 MED ORDER — CHLORHEXIDINE GLUCONATE 4 % EX LIQD: 60.0000 mL | Freq: Once | CUTANEOUS | Status: DC

## 2021-01-27 MED ORDER — ALBUTEROL SULFATE HFA 108 (90 BASE) MCG/ACT IN AERS
2.0000 | INHALATION_SPRAY | Freq: Four times a day (QID) | RESPIRATORY_TRACT | Status: DC | PRN
  Filled 2021-01-27: qty 6.7

## 2021-01-27 MED ORDER — MORPHINE SULFATE (PF) 2 MG/ML IV SOLN: 0.5000 mg | INTRAVENOUS | Status: DC | PRN

## 2021-01-27 MED ORDER — HYDROCODONE-ACETAMINOPHEN 5-325 MG PO TABS
1.0000 | ORAL_TABLET | ORAL | Status: DC | PRN
  Filled 2021-01-27: qty 2

## 2021-01-27 MED ORDER — CELECOXIB 200 MG PO CAPS
ORAL_CAPSULE | ORAL | Status: AC
  Administered 2021-01-27: 400 mg via ORAL
  Filled 2021-01-27: qty 2

## 2021-01-27 MED ORDER — MENTHOL 3 MG MT LOZG
1.0000 | LOZENGE | OROMUCOSAL | Status: DC | PRN
  Filled 2021-01-27: qty 9

## 2021-01-27 MED ORDER — ONDANSETRON HCL 4 MG/2ML IJ SOLN: 4.0000 mg | Freq: Four times a day (QID) | INTRAMUSCULAR | Status: DC | PRN

## 2021-01-27 MED ORDER — BUPIVACAINE HCL (PF) 0.5 % IJ SOLN
INTRAMUSCULAR | Status: DC | PRN
  Administered 2021-01-27: 3 mL

## 2021-01-27 MED ORDER — HYDROCHLOROTHIAZIDE 25 MG PO TABS
25.0000 mg | ORAL_TABLET | Freq: Every day | ORAL | Status: DC
  Administered 2021-01-28: 25 mg via ORAL
  Filled 2021-01-27: qty 1

## 2021-01-27 MED ORDER — ENOXAPARIN SODIUM 30 MG/0.3ML ~~LOC~~ SOLN
30.0000 mg | Freq: Two times a day (BID) | SUBCUTANEOUS | Status: DC
  Administered 2021-01-28: 30 mg via SUBCUTANEOUS
  Filled 2021-01-27: qty 0.3

## 2021-01-27 MED ORDER — CLOTRIMAZOLE 1 % EX CREA
1.0000 "application " | TOPICAL_CREAM | Freq: Every day | CUTANEOUS | Status: DC | PRN
  Filled 2021-01-27: qty 15

## 2021-01-27 MED ORDER — ZOLPIDEM TARTRATE 5 MG PO TABS
5.0000 mg | ORAL_TABLET | Freq: Every day | ORAL | Status: DC
  Administered 2021-01-27: 5 mg via ORAL
  Filled 2021-01-27: qty 1

## 2021-01-27 MED ORDER — ORAL CARE MOUTH RINSE: 15.0000 mL | Freq: Once | OROMUCOSAL | Status: AC

## 2021-01-27 MED ORDER — PROPOFOL 500 MG/50ML IV EMUL
INTRAVENOUS | Status: DC | PRN
  Administered 2021-01-27: 100 ug/kg/min via INTRAVENOUS

## 2021-01-27 MED ORDER — LACTATED RINGERS IV SOLN: INTRAVENOUS | Status: DC

## 2021-01-27 MED ORDER — FENTANYL CITRATE (PF) 100 MCG/2ML IJ SOLN
25.0000 ug | INTRAMUSCULAR | Status: DC | PRN
  Administered 2021-01-27: 50 ug via INTRAVENOUS

## 2021-01-27 MED ORDER — CELECOXIB 200 MG PO CAPS: 400.0000 mg | ORAL_CAPSULE | Freq: Once | ORAL | Status: AC

## 2021-01-27 MED ORDER — FENTANYL CITRATE (PF) 100 MCG/2ML IJ SOLN
INTRAMUSCULAR | Status: AC
  Filled 2021-01-27: qty 2

## 2021-01-27 MED ORDER — DEXMEDETOMIDINE (PRECEDEX) IN NS 20 MCG/5ML (4 MCG/ML) IV SYRINGE
PREFILLED_SYRINGE | INTRAVENOUS | Status: AC
  Filled 2021-01-27: qty 5

## 2021-01-27 MED ORDER — CHLORHEXIDINE GLUCONATE 0.12 % MT SOLN: 15.0000 mL | Freq: Once | OROMUCOSAL | Status: AC

## 2021-01-27 MED ORDER — SODIUM CHLORIDE 0.9 % IV SOLN
INTRAVENOUS | Status: DC | PRN
  Administered 2021-01-27: 50 ug/min via INTRAVENOUS

## 2021-01-27 MED ORDER — MIDAZOLAM HCL 2 MG/2ML IJ SOLN
INTRAMUSCULAR | Status: AC
  Filled 2021-01-27: qty 2

## 2021-01-27 MED ORDER — TRANEXAMIC ACID-NACL 1000-0.7 MG/100ML-% IV SOLN: 1000.0000 mg | Freq: Once | INTRAVENOUS | Status: DC

## 2021-01-27 MED ORDER — PANTOPRAZOLE SODIUM 40 MG PO TBEC
40.0000 mg | DELAYED_RELEASE_TABLET | Freq: Two times a day (BID) | ORAL | Status: DC
  Administered 2021-01-27 – 2021-01-28 (×2): 40 mg via ORAL
  Filled 2021-01-27 (×2): qty 1

## 2021-01-27 MED ORDER — TRANEXAMIC ACID-NACL 1000-0.7 MG/100ML-% IV SOLN
INTRAVENOUS | Status: DC | PRN
  Administered 2021-01-27: 1000 mg via INTRAVENOUS

## 2021-01-27 MED ORDER — SURGIPHOR WOUND IRRIGATION SYSTEM - OPTIME
TOPICAL | Status: DC | PRN
  Administered 2021-01-27: 1 via TOPICAL

## 2021-01-27 MED ORDER — PROPOFOL 1000 MG/100ML IV EMUL
INTRAVENOUS | Status: AC
  Filled 2021-01-27: qty 100

## 2021-01-27 MED ORDER — GABAPENTIN 300 MG PO CAPS
ORAL_CAPSULE | ORAL | Status: AC
  Administered 2021-01-27: 300 mg via ORAL
  Filled 2021-01-27: qty 1

## 2021-01-27 MED ORDER — FENTANYL CITRATE (PF) 100 MCG/2ML IJ SOLN
INTRAMUSCULAR | Status: AC
  Administered 2021-01-27: 50 ug via INTRAVENOUS
  Filled 2021-01-27: qty 2

## 2021-01-27 MED ORDER — NEOMYCIN-POLYMYXIN B GU 40-200000 IR SOLN
Status: DC | PRN
  Administered 2021-01-27: 16 mL

## 2021-01-27 MED ORDER — DEXAMETHASONE SODIUM PHOSPHATE 4 MG/ML IJ SOLN
INTRAMUSCULAR | Status: DC | PRN
  Administered 2021-01-27: 10 mg via INTRAVENOUS

## 2021-01-27 MED ORDER — ALUM & MAG HYDROXIDE-SIMETH 200-200-20 MG/5ML PO SUSP: 30.0000 mL | ORAL | Status: DC | PRN

## 2021-01-27 MED ORDER — VITAMIN D 25 MCG (1000 UNIT) PO TABS
1000.0000 [IU] | ORAL_TABLET | Freq: Every day | ORAL | Status: DC
  Administered 2021-01-27: 1000 [IU] via ORAL
  Filled 2021-01-27: qty 1

## 2021-01-27 MED ORDER — ROCURONIUM BROMIDE 100 MG/10ML IV SOLN
INTRAVENOUS | Status: DC | PRN
  Administered 2021-01-27 (×2): 50 mg via INTRAVENOUS
  Administered 2021-01-27: 30 mg via INTRAVENOUS
  Administered 2021-01-27: 50 mg via INTRAVENOUS

## 2021-01-27 MED ORDER — BISACODYL 10 MG RE SUPP: 10.0000 mg | Freq: Every day | RECTAL | Status: DC | PRN

## 2021-01-27 MED ORDER — CEFAZOLIN SODIUM-DEXTROSE 2-4 GM/100ML-% IV SOLN
2.0000 g | Freq: Four times a day (QID) | INTRAVENOUS | Status: AC
  Filled 2021-01-27 (×5): qty 100

## 2021-01-27 MED ORDER — TRANEXAMIC ACID-NACL 1000-0.7 MG/100ML-% IV SOLN
INTRAVENOUS | Status: AC
  Administered 2021-01-27: 1000 mg via INTRAVENOUS
  Filled 2021-01-27: qty 100

## 2021-01-27 MED ORDER — ACETAMINOPHEN 325 MG PO TABS: 325.0000 mg | ORAL_TABLET | Freq: Four times a day (QID) | ORAL | Status: DC | PRN

## 2021-01-27 MED ORDER — CEFAZOLIN SODIUM-DEXTROSE 2-4 GM/100ML-% IV SOLN
2.0000 g | INTRAVENOUS | Status: AC
Start: 2021-01-27 — End: 2021-01-27
  Administered 2021-01-27 (×2): 2 g via INTRAVENOUS

## 2021-01-27 MED ORDER — GABAPENTIN 300 MG PO CAPS
600.0000 mg | ORAL_CAPSULE | Freq: Every day | ORAL | Status: DC
  Administered 2021-01-27: 600 mg via ORAL
  Filled 2021-01-27: qty 2

## 2021-01-27 MED ORDER — SENNOSIDES-DOCUSATE SODIUM 8.6-50 MG PO TABS
1.0000 | ORAL_TABLET | Freq: Two times a day (BID) | ORAL | Status: DC
  Administered 2021-01-27: 1 via ORAL
  Filled 2021-01-27 (×2): qty 1

## 2021-01-27 MED ORDER — HYDROCODONE-ACETAMINOPHEN 7.5-325 MG PO TABS: 1.0000 | ORAL_TABLET | ORAL | Status: DC | PRN

## 2021-01-27 MED ORDER — ACETAMINOPHEN 10 MG/ML IV SOLN
1000.0000 mg | Freq: Four times a day (QID) | INTRAVENOUS | Status: DC
  Administered 2021-01-28 (×3): 1000 mg via INTRAVENOUS
  Filled 2021-01-27 (×4): qty 100

## 2021-01-27 MED ORDER — LISINOPRIL 20 MG PO TABS
40.0000 mg | ORAL_TABLET | Freq: Every day | ORAL | Status: DC
  Administered 2021-01-28: 40 mg via ORAL
  Filled 2021-01-27: qty 2

## 2021-01-27 MED ORDER — ONDANSETRON HCL 4 MG/2ML IJ SOLN
INTRAMUSCULAR | Status: DC | PRN
  Administered 2021-01-27 (×2): 4 mg via INTRAVENOUS

## 2021-01-27 MED ORDER — FERROUS SULFATE 325 (65 FE) MG PO TABS
325.0000 mg | ORAL_TABLET | Freq: Two times a day (BID) | ORAL | Status: DC
  Filled 2021-01-27 (×2): qty 1

## 2021-01-27 MED ORDER — ACETAMINOPHEN 10 MG/ML IV SOLN: 1000.0000 mg | Freq: Once | INTRAVENOUS | Status: DC | PRN

## 2021-01-27 MED ORDER — CHLORHEXIDINE GLUCONATE 0.12 % MT SOLN
OROMUCOSAL | Status: AC
  Administered 2021-01-27: 15 mL via OROMUCOSAL
  Filled 2021-01-27: qty 15

## 2021-01-27 MED ORDER — MIDAZOLAM HCL 5 MG/5ML IJ SOLN
INTRAMUSCULAR | Status: DC | PRN
  Administered 2021-01-27: 2 mg via INTRAVENOUS

## 2021-01-27 MED ORDER — SODIUM CHLORIDE 0.9 % IV SOLN
INTRAVENOUS | Status: DC | PRN
  Administered 2021-01-27: 60 mL

## 2021-01-27 SURGICAL SUPPLY — 82 items
ATTUNE PSRP INSR SZ4 12 KNEE (Insert) ×1 IMPLANT
AUGMENT DISTAL FEM SZ4 4 KNEE (Miscellaneous) ×1 IMPLANT
AUGMENT DISTAL FEM SZ4 8 HIP (Miscellaneous) ×1 IMPLANT
BASEPLATE TIBIAL ROTATING SZ 4 (Knees) ×1 IMPLANT
BATTERY INSTRU NAVIGATION (MISCELLANEOUS) ×8 IMPLANT
BLADE MED AGGRESSIVE (BLADE) ×2 IMPLANT
BLADE SAW 70X12.5 (BLADE) ×2 IMPLANT
BLADE SAW 90X13X1.19 OSCILLAT (BLADE) ×2 IMPLANT
BLADE SAW 90X25X1.19 OSCILLAT (BLADE) ×2 IMPLANT
BONE CEMENT GENTAMICIN (Cement) ×4 IMPLANT
BUR 4X55 1 (BURR) ×1 IMPLANT
CEMENT BONE GENTAMICIN 40 (Cement) IMPLANT
COMP FEM ATTUNE CRS SZ4 LT (Femur) ×2 IMPLANT
COMPONENT FEM ATN CR SZ4 LT (Femur) IMPLANT
COOLER POLAR GLACIER W/PUMP (MISCELLANEOUS) ×2 IMPLANT
COVER WAND RF STERILE (DRAPES) ×2 IMPLANT
CUFF TOURN SGL QUICK 34 (TOURNIQUET CUFF) ×1
CUFF TRNQT CYL 34X4X40X1 (TOURNIQUET CUFF) IMPLANT
DRAPE 3/4 80X56 (DRAPES) ×4 IMPLANT
DRAPE INCISE IOBAN 66X45 STRL (DRAPES) ×1 IMPLANT
DRESSING PEEL AND PLAC PRVNA20 (GAUZE/BANDAGES/DRESSINGS) ×1 IMPLANT
DRSG DERMACEA 8X12 NADH (GAUZE/BANDAGES/DRESSINGS) ×2 IMPLANT
DRSG MEPILEX SACRM 8.7X9.8 (GAUZE/BANDAGES/DRESSINGS) ×2 IMPLANT
DRSG OPSITE POSTOP 4X14 (GAUZE/BANDAGES/DRESSINGS) ×2 IMPLANT
DRSG PEEL AND PLACE PREVENA 20 (GAUZE/BANDAGES/DRESSINGS) ×2
DRSG TEGADERM 4X4.75 (GAUZE/BANDAGES/DRESSINGS) ×2 IMPLANT
DURAPREP 26ML APPLICATOR (WOUND CARE) ×4 IMPLANT
ELECT CAUTERY BLADE 6.4 (BLADE) ×2 IMPLANT
ELECT REM PT RETURN 9FT ADLT (ELECTROSURGICAL) ×2
ELECTRODE REM PT RTRN 9FT ADLT (ELECTROSURGICAL) ×1 IMPLANT
EX-PIN ORTHOLOCK NAV 4X150 (PIN) ×4 IMPLANT
GLOVE SURG ENC MOIS LTX SZ7.5 (GLOVE) ×4 IMPLANT
GLOVE SURG ENC TEXT LTX SZ7.5 (GLOVE) ×4 IMPLANT
GLOVE SURG UNDER LTX SZ8 (GLOVE) ×2 IMPLANT
GLOVE SURG UNDER POLY LF SZ7.5 (GLOVE) ×2 IMPLANT
GOWN STRL REUS W/ TWL LRG LVL3 (GOWN DISPOSABLE) ×2 IMPLANT
GOWN STRL REUS W/ TWL XL LVL3 (GOWN DISPOSABLE) ×1 IMPLANT
GOWN STRL REUS W/TWL LRG LVL3 (GOWN DISPOSABLE) ×2
GOWN STRL REUS W/TWL XL LVL3 (GOWN DISPOSABLE) ×1
HEMOVAC 400CC 10FR (MISCELLANEOUS) ×2 IMPLANT
HOLDER FOLEY CATH W/STRAP (MISCELLANEOUS) ×2 IMPLANT
HOOD PEEL AWAY FLYTE STAYCOOL (MISCELLANEOUS) ×5 IMPLANT
IRRIGATION SURGIPHOR STRL (IV SOLUTION) ×2 IMPLANT
IV NS IRRIG 3000ML ARTHROMATIC (IV SOLUTION) ×2 IMPLANT
KIT TURNOVER KIT A (KITS) ×2 IMPLANT
KNIFE SCULPS 14X20 (INSTRUMENTS) ×2 IMPLANT
LABEL OR SOLS (LABEL) ×2 IMPLANT
MANIFOLD NEPTUNE II (INSTRUMENTS) ×4 IMPLANT
NDL SAFETY ECLIPSE 18X1.5 (NEEDLE) ×1 IMPLANT
NDL SPNL 20GX3.5 QUINCKE YW (NEEDLE) ×2 IMPLANT
NEEDLE HYPO 18GX1.5 SHARP (NEEDLE) ×1
NEEDLE SPNL 20GX3.5 QUINCKE YW (NEEDLE) ×4 IMPLANT
NS IRRIG 500ML POUR BTL (IV SOLUTION) ×2 IMPLANT
OSTEOTOME THIN 10.0 1.5 (INSTRUMENTS) ×1 IMPLANT
OSTEOTOME THIN 6.0 1.5 (INSTRUMENTS) ×1 IMPLANT
PACK TOTAL KNEE (MISCELLANEOUS) ×2 IMPLANT
PAD WRAPON POLOR MULTI XL (MISCELLANEOUS) IMPLANT
PATELLA MEDIAL ATTUN 35MM KNEE (Knees) ×1 IMPLANT
PENCIL SMOKE EVACUATOR COATED (MISCELLANEOUS) ×2 IMPLANT
PIN DRILL QUICK PACK ×2 IMPLANT
PIN FIXATION 1/8DIA X 3INL (PIN) ×6 IMPLANT
PULSAVAC PLUS IRRIG FAN TIP (DISPOSABLE) ×2
SOL PREP PVP 2OZ (MISCELLANEOUS) ×2
SOLUTION PREP PVP 2OZ (MISCELLANEOUS) ×1 IMPLANT
SPONGE DRAIN TRACH 4X4 STRL 2S (GAUZE/BANDAGES/DRESSINGS) ×2 IMPLANT
STAPLER SKIN PROX 35W (STAPLE) ×2 IMPLANT
STEM STR ATTUNE PF 18X60 (Knees) ×1 IMPLANT
STOCKINETTE IMPERV 14X48 (MISCELLANEOUS) IMPLANT
STRAP TIBIA SHORT (MISCELLANEOUS) ×2 IMPLANT
SUCTION FRAZIER HANDLE 10FR (MISCELLANEOUS) ×1
SUCTION TUBE FRAZIER 10FR DISP (MISCELLANEOUS) ×1 IMPLANT
SUT VIC AB 0 CT1 36 (SUTURE) ×4 IMPLANT
SUT VIC AB 1 CT1 36 (SUTURE) ×4 IMPLANT
SUT VIC AB 2-0 CT2 27 (SUTURE) ×2 IMPLANT
SYR 20ML LL LF (SYRINGE) ×2 IMPLANT
SYR 30ML LL (SYRINGE) ×4 IMPLANT
TIP FAN IRRIG PULSAVAC PLUS (DISPOSABLE) ×1 IMPLANT
TOWEL OR 17X26 4PK STRL BLUE (TOWEL DISPOSABLE) ×2 IMPLANT
TOWER CARTRIDGE SMART MIX (DISPOSABLE) ×2 IMPLANT
TRAY FOLEY MTR SLVR 16FR STAT (SET/KITS/TRAYS/PACK) ×2 IMPLANT
WRAP-ON POLOR PAD MULTI XL (MISCELLANEOUS) ×1
WRAPON POLOR PAD MULTI XL (MISCELLANEOUS) ×1

## 2021-01-27 NOTE — Transfer of Care (Signed)
Immediate Anesthesia Transfer of Care Note  Patient: Katrina Poole  Procedure(s) Performed: CONVERSION OF LEFT PARTIAL KNEE TO LEFT TOTAL KNEE. (Left Knee)  Patient Location: PACU  Anesthesia Type:Spinal  Level of Consciousness: awake, alert  and oriented  Airway & Oxygen Therapy: Patient Spontanous Breathing and Patient connected to face mask oxygen  Post-op Assessment: Report given to RN and Post -op Vital signs reviewed and stable  Post vital signs: Reviewed and stable  Last Vitals:  Vitals Value Taken Time  BP 156/85 01/27/21 1836  Temp 36.2 C 01/27/21 1835  Pulse 73 01/27/21 1840  Resp 15 01/27/21 1840  SpO2 98 % 01/27/21 1840  Vitals shown include unvalidated device data.  Last Pain:  Vitals:   01/27/21 1835  TempSrc:   PainSc: Asleep         Complications: No complications documented.

## 2021-01-27 NOTE — Anesthesia Postprocedure Evaluation (Signed)
Anesthesia Post Note  Patient: Adiana G Siegman  Procedure(s) Performed: CONVERSION OF LEFT PARTIAL KNEE TO LEFT TOTAL KNEE. (Left Knee)  Patient location during evaluation: PACU Anesthesia Type: Combined General/Spinal Level of consciousness: awake and alert Pain management: pain level controlled Vital Signs Assessment: post-procedure vital signs reviewed and stable Respiratory status: spontaneous breathing, nonlabored ventilation, respiratory function stable and patient connected to nasal cannula oxygen Cardiovascular status: blood pressure returned to baseline and stable Postop Assessment: no apparent nausea or vomiting and patient able to bend at knees Anesthetic complications: no   No complications documented.   Last Vitals:  Vitals:   01/27/21 1940 01/27/21 1945  BP: 139/81   Pulse: 72 67  Resp: 17 19  Temp:    SpO2: 100% 97%    Last Pain:  Vitals:   01/27/21 1920  TempSrc:   PainSc: 3                  Cleda Mccreedy Nejla Reasor

## 2021-01-27 NOTE — Anesthesia Procedure Notes (Signed)
Date/Time: 01/27/2021 11:56 AM Performed by: Junious Silk, CRNA Pre-anesthesia Checklist: Patient identified, Emergency Drugs available, Suction available, Patient being monitored and Timeout performed Oxygen Delivery Method: Simple face mask

## 2021-01-27 NOTE — Anesthesia Procedure Notes (Signed)
Procedure Name: Intubation Date/Time: 01/27/2021 2:27 PM Performed by: Nelda Marseille, CRNA Pre-anesthesia Checklist: Patient identified, Patient being monitored, Timeout performed, Emergency Drugs available and Suction available Patient Re-evaluated:Patient Re-evaluated prior to induction Oxygen Delivery Method: Circle system utilized Preoxygenation: Pre-oxygenation with 100% oxygen Induction Type: IV induction Ventilation: Mask ventilation without difficulty Laryngoscope Size: Mac, 3 and McGraph Grade View: Grade I Tube type: Oral Tube size: 7.0 mm Number of attempts: 1 Airway Equipment and Method: Stylet Placement Confirmation: ETT inserted through vocal cords under direct vision,  positive ETCO2 and breath sounds checked- equal and bilateral Secured at: 21 cm Tube secured with: Tape Dental Injury: Teeth and Oropharynx as per pre-operative assessment  Comments: Conversion to general ETT from Spinal anesthetic

## 2021-01-27 NOTE — Op Note (Signed)
OPERATIVE NOTE  DATE OF SURGERY:  01/27/2021  PATIENT NAME:  Katrina Poole   DOB: [birth year]  MRN: [id number]  PRE-OPERATIVE DIAGNOSIS: Degenerative arthrosis of the left knee, status post left medial unicompartmental knee arthroplasty  POST-OPERATIVE DIAGNOSIS:  Same  PROCEDURE:  Removal of left medial unicompartmental knee arthroplasty implants and conversion to a left total knee arthroplasty using computer-assisted navigation  SURGEON:  Jena Gauss. M.D.  ASSISTANT: Baldwin Jamaica, PA-C (present and scrubbed throughout the case, critical for assistance with exposure, retraction, instrumentation, and closure)  ANESTHESIA: spinal and general  ESTIMATED BLOOD LOSS: 150 mL  FLUIDS REPLACED: 2300 mL of crystalloid  TOURNIQUET TIME: #1-120 minutes #2-29 minutes  DRAINS: 2 medium Hemovac drains  SOFT TISSUE RELEASES: Anterior cruciate ligament, posterior cruciate ligament, deep medial collateral ligament, patellofemoral ligament  IMPLANTS UTILIZED: DePuy Attune size 4 revision CRS femoral component (cemented), 18 mm x 60 mm Attune revision press-fit stem, 8 mm medial distal femoral augment, 4 mm Attune lateral distal femoral augment size 4 rotating platform tibial component (cemented), 35 mm medialized dome patella (cemented), and a 12 mm stabilized rotating platform polyethylene insert.  INDICATIONS FOR SURGERY: Katrina Poole is a 75 y.o. year old female previously underwent a left medial unicompartmental knee arthroplasty by another surgeon.  She was having increasing pain and radiographs demonstrated progressive degenerative changes to the lateral compartment as well as the patellofemoral compartment.. X-rays demonstrated severe degenerative changes in tricompartmental fashion. The patient had not seen any significant improvement despite conservative nonsurgical intervention. After discussion of the risks and benefits of surgical intervention, the patient expressed understanding  of the risks benefits and agree with plans for conversion /revision of the left medial unicompartmental knee arthroplasty to a total knee arthroplasty.   The risks, benefits, and alternatives were discussed at length including but not limited to the risks of infection, bleeding, nerve injury, stiffness, blood clots, the need for revision surgery, cardiopulmonary complications, among others, and they were willing to proceed.  PROCEDURE IN DETAIL: The patient was brought into the operating room and, after adequate spinal and general anesthesia was achieved, a tourniquet was placed on the patient's upper thigh. The patient's knee and leg were cleaned and prepped with alcohol and DuraPrep and draped in the usual sterile fashion. A "timeout" was performed as per usual protocol. The lower extremity was exsanguinated using an Esmarch, and the tourniquet was inflated to 300 mmHg. An anterior longitudinal incision was made followed by a standard medial parapatellar approach.  Swabs were obtained and submitted to pathology for stat gram stain, culture, and sensitivity.  The deep fibers of the medial collateral ligament were elevated in a subperiosteal fashion off of the medial flare of the tibia so as to maintain a continuous soft tissue sleeve. The patella was subluxed laterally and the patellofemoral ligament was incised. Inspection of the knee demonstrated severe degenerative changes with full-thickness loss of articular cartilage. Osteophytes were debrided using a rongeur. Anterior and posterior cruciate ligaments were excised. Two 4.0 mm Schanz pins were inserted in the femur and into the tibia for attachment of the array of trackers used for computer-assisted navigation.  Attention was directed to the medial unicompartmental implants.  A curette was used to debride the periphery of both the tibial and femoral implants.  A TPS saw was used at the implant cement interface of the tibial component.  Thin flexible  osteotomes were then used to further loosen the tibial implant.  The tibial component was removed without  significant bone loss.  Inspection of the polyethylene showed no significant wear.  Next attention was directed to the femoral component.  In a similar fashion the TPS saw was used to establish an interface between the implant and the cement.  The thin flexible osteotomes were used to carefully debonded the implant at the implant cement interface.  The implant was eventually removed with minimal bone loss distally and to the posterior condyle.  Remnants of cement were removed from both the femoral and tibial sites.  The tourniquet was deflated after initial tourniquet time of 120 minutes.  Hemostasis was achieved using electrocautery.  Hip center was identified using a circumduction technique. Distal landmarks were mapped using the computer. The distal femur and proximal tibia were mapped using the computer. The distal femoral cutting guide was positioned using computer-assisted navigation so as to achieve a 5 distal valgus cut. The femur was sized and it was felt that a size 4 femoral component was appropriate. A size 4 femoral cutting guide was positioned and the anterior cut was performed and verified using the computer. This was followed by completion of the posterior and chamfer cuts. Femoral cutting guide for the central box was then positioned in the center box cut was performed.  Attention was then directed to the proximal tibia. Medial and lateral menisci were excised. The extramedullary tibial cutting guide was positioned using computer-assisted navigation so as to achieve a 0 varus-valgus alignment and 3 posterior slope. The cut was performed and verified using the computer. The proximal tibia was sized and it was felt that a size 4 tibial tray was appropriate. Tibial and femoral trials were inserted followed by insertion of a 12 mm polyethylene insert.  The knee was placed through range of  motion and there was marked hyperextension and laxity in full extension although good balance was noted with the knee in flexion.  It was thus elected to proceed with distal augmentation of the femoral implant.  The tibial and femoral trackers were removed at this point.  The distal femoral canal was then reamed in a sequential fashion up to a 19 mm diameter so as to allow for a revision stem.  An 18 mm x 60 mm femoral stem was attached to the revision femoral component.  A 4 mm lateral distal femoral augment and an 8 mm medial distal femoral augment were placed on the trial.  The trial was inserted and a 12 mm polyethylene was inserted.  This allowed for full extension and excellent balance both in full extension and in flexion.  Finally, the patella was cut and prepared so as to accommodate a 35 mm medialized dome patella. A patella trial was placed and the knee was placed through a range of motion with excellent patellar tracking appreciated.  The left lower extremity was exsanguinated using an Esmarch and the tourniquet was reinflated to 300 mmHg.  The femoral trial was removed after debridement of posterior osteophytes. The central post-hole for the tibial component was reamed followed by insertion of a keel punch. Tibial trials were then removed. Cut surfaces of bone were irrigated with copious amounts of normal saline using pulsatile lavage and then suctioned dry. Polymethylmethacrylate cement with gentamicin was prepared in the usual fashion using a vacuum mixer. Cement was applied to the cut surface of the proximal tibia as well as along the undersurface of a size 4 rotating platform tibial component. Tibial component was positioned and impacted into place. Excess cement was removed using Personal assistant. Cement  was then applied to the cut surfaces of the femur as well as along the posterior flanges of the size 4 femoral revision construct. The femoral component was positioned and impacted into place.  Excess cement was removed using Personal assistant. A 12 mm polyethylene trial was inserted and the knee was brought into full extension with steady axial compression applied. Finally, cement was applied to the backside of a 35 mm medialized dome patella and the patellar component was positioned and patellar clamp applied. Excess cement was removed using Personal assistant. After adequate curing of the cement, the tourniquet was deflated after a second tourniquet time of 29 minutes. Hemostasis was achieved using electrocautery. The knee was irrigated with copious amounts of normal saline using pulsatile lavage followed by 500 ml of Surgiphor and then suctioned dry. 20 mL of 1.3% Exparel and 60 mL of 0.25% Marcaine in 40 mL of normal saline was injected along the posterior capsule, medial and lateral gutters, and along the arthrotomy site. A 12 mm stabilized rotating platform polyethylene insert was inserted and the knee was placed through a range of motion with excellent mediolateral soft tissue balancing appreciated and excellent patellar tracking noted. 2 medium drains were placed in the wound bed and brought out through separate stab incisions. The medial parapatellar portion of the incision was reapproximated using interrupted sutures of #1 Vicryl. Subcutaneous tissue was approximated in layers using first #0 Vicryl followed #2-0 Vicryl. The skin was approximated with skin staples. A sterile dressing was applied.  The patient tolerated the procedure well and was transported to the recovery room in stable condition.    Luisfernando Brightwell P. Angie Fava., M.D.

## 2021-01-27 NOTE — Anesthesia Preprocedure Evaluation (Signed)
Anesthesia Evaluation  Patient identified by MRN, date of birth, ID band Patient awake    Reviewed: Allergy & Precautions, NPO status , Patient's Chart, lab work & pertinent test results  History of Anesthesia Complications Negative for: history of anesthetic complications  Airway Mallampati: II  TM Distance: >3 FB Neck ROM: Full    Dental  (+) Upper Dentures, Partial Lower   Pulmonary asthma , neg sleep apnea, neg COPD, Patient abstained from smoking.Not current smoker, former smoker,  Takes albuterol couple times per day. Is not on any other daily inhalers   breath sounds clear to auscultation       Cardiovascular Exercise Tolerance: Poor METShypertension, (-) CAD and (-) Past MI (-) dysrhythmias  Rhythm:Regular Rate:Normal     Neuro/Psych  Neuromuscular disease negative psych ROS   GI/Hepatic Neg liver ROS, GERD  Medicated and Controlled,  Endo/Other  neg diabetesMorbid obesity  Renal/GU negative Renal ROS     Musculoskeletal  (+) Arthritis , Osteoarthritis,    Abdominal (+) + obese,   Peds  Hematology   Anesthesia Other Findings Past Medical History: No date: Arthritis     Comment:  osteo of left hip No date: Asthma     Comment:  humidity and exercise during summer No date: GERD (gastroesophageal reflux disease) No date: Hypertension     Comment:  controlled on meds 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 No date: Neuromuscular disorder (HCC)     Comment:  finger tips go numb, neck vertebrae No date: Restless leg syndrome No date: Vertigo No date: Wears dentures     Comment:  full dentures on top, partial bottom  Reproductive/Obstetrics                             Anesthesia Physical  Anesthesia Plan  ASA: III  Anesthesia Plan: General/Spinal   Post-op Pain Management:    Induction: Intravenous  PONV Risk Score and Plan: 3 and Dexamethasone,  Ondansetron and Treatment may vary due to age or medical condition  Airway Management Planned: Oral ETT  Additional Equipment: None  Intra-op Plan:   Post-operative Plan:   Informed Consent: I have reviewed the patients History and Physical, chart, labs and discussed the procedure including the risks, benefits and alternatives for the proposed anesthesia with the patient or authorized representative who has indicated his/her understanding and acceptance.     Dental advisory given  Plan Discussed with: Anesthesiologist and CRNA  Anesthesia Plan Comments: (Discussed R/B/A of neuraxial anesthesia technique with patient: - rare risks of spinal/epidural hematoma, nerve damage, infection - Risk of PDPH - Risk of nausea and vomiting - Risk of conversion to general anesthesia and its associated risks, including sore throat, damage to lips/teeth/oropharynx, and rare risks such as cardiac and respiratory events.  Patient voiced understanding.)        Anesthesia Quick Evaluation

## 2021-01-27 NOTE — H&P (Signed)
The patient has been re-examined, and the chart reviewed, and there have been no interval changes to the documented history and physical.    The risks, benefits, and alternatives have been discussed at length. The patient expressed understanding of the risks benefits and agreed with plans for surgical intervention.  Makenzye Troutman P. Kevin Space, Jr. M.D.    

## 2021-01-27 NOTE — H&P (Signed)
ORTHOPAEDIC HISTORY & PHYSICAL Michelene Gardener, [place] - 01/21/2021 1:45 PM EDT Formatting of this note is different from the original. [facility] Chief Complaint:   Chief Complaint  Patient presents with  . Knee Pain  H & P LEFT KNEE   History of Present Illness:   Katrina Poole is a 75 y.o. female that presents to clinic today for her preoperative history and evaluation. Patient presents unaccompanied. The patient is scheduled to undergo a conversion of a left medial unicompartmental knee arthroplasty to a left total knee arthroplasty on 01/27/2021 by Dr. Ernest Pine. Patient is status post left medial unicompartmental knee arthroplasty by Dr. Erin Sons on 02/18/2015. Her pain began over the last several months. The pain is located primarily along the lateral aspect of the knee. She describes her pain as worse with weightbearing. She reports associated activity related swelling. She denies associated numbness or tingling.   The patient's symptoms have progressed to the point that they decrease her quality of life.   Patient has not made her weight loss goal. She states that she would like to go home the same day as her surgery as she cannot sleep in the hospital. She plans to attend physical therapy post-operatively at [facility] physical therapy. Patient also states that she would like to take Tylenol with codeine after surgery as she cannot tolerate oxycodone.  Patient denies history of blood clots, she has had lumbar surgery but denies any retained hardware. Denies significant cardiac history.   Past Medical, Surgical, Family, Social History, Allergies, Medications:   Past Medical History:  Past Medical History:  Diagnosis Date  . Arthritis  . Asthma without status asthmaticus  . Diverticulosis 06/04/2016  . Gastritis 06/04/2016  . History of chickenpox  . Hives  . Hypertension  . Meniere's disease  . Proximal humerus fracture  01/21/2019  Dr Martha Clan   Past Surgical History:  Past Surgical History:  Procedure Laterality Date  . ACF C3-7 03/16/2018  Dr. Wynetta Emery  . Arthroscopic limited synovectomy along with lateral compartment and retropatellar chondral debridement plus removal of several small loose bodies Left 07/19/2015  Dr. Erin Sons  . ARTHROSCOPY SHOULDER  . BUNION CORRECTION Bilateral  . COLONOSCOPY 06/04/2016  Diverticulosis/Leiomyoma polyp/Repeat 2 to 4 months/MUS (Patient declines any sort of repeat per 06/29/2016 office visit)  . colonscopy  . EGD 06/04/2016  Gastritis/No Repeat/MUS  . ENDOSCOPIC CARPAL TUNNEL RELEASE  . heart catherization  x 2  . INCISION TENDON SHEATH FOR TRIGGER FINGER Bilateral  . Lateral compartment MAKOplasty procedure on the right Right 05/02/2014  Dr De Blanch  . Left medial unicompartmental arthroplasty 02/18/2015  Dr De Blanch  . Left total hip arthroplasty 11/25/2015  Dr Ernest Pine  . OTHER SURGERY  bilateral feet  . OTHER SURGERY  exploratory surgery  . UNLISTED LAPAROSCOPY PROCEDURE OVIDUCT/OVARY   Current Medications:  Current Outpatient Medications  Medication Sig Dispense Refill  . acetaminophen (TYLENOL) 500 MG tablet Take 1,000 mg by mouth every 6 (six) hours as needed for Pain  . albuterol 90 mcg/actuation inhaler Inhale 2 inhalations into the lungs every 4 (four) hours as needed for Wheezing  . amLODIPine (NORVASC) 5 MG tablet Take 5 mg by mouth once daily.  . cholecalciferol, vitamin D3, (VITAMIN D3) 125 mcg (5,000 unit) tablet Take 5,000 Units by mouth once daily  . clonazePAM (KLONOPIN) 0.5 MG tablet 0.5 mg Take 1/2 to 1 tablet by mouth every night as needed [for anxiety].  Marland Kitchen  fosinopril (MONOPRIL) 40 MG tablet Take 40 mg by mouth once daily.  Marland Kitchen gabapentin (NEURONTIN) 300 MG capsule TAKE ONE CAPSULE BY MOUTH ONCE NIGHTLY AS NEEDED AND TWO AT BEDTIME (Patient taking differently: 600 mg nightly) 60 capsule 0  . hydrochlorothiazide (HYDRODIURIL) 25  MG tablet Take 25 mg by mouth once daily.  . meclizine (ANTIVERT) 12.5 mg tablet Take 12.5 mg by mouth 3 (three) times daily as needed for Dizziness  . meloxicam (MOBIC) 7.5 MG tablet TAKE ONE TABLET BY MOUTH TWICE DAILY 60 tablet 3  . metoprolol succinate (TOPROL-XL) 50 MG XL tablet Take 50 mg by mouth once daily.  . multivitamin tablet Take 1 tablet by mouth once daily.  Marland Kitchen omeprazole (PRILOSEC) 20 MG DR capsule Take 20 mg by mouth 2 (two) times daily.   Marland Kitchen zolpidem (AMBIEN) 5 MG tablet Take 5 mg by mouth nightly   No current facility-administered medications for this visit.   Allergies:  Allergies  Allergen Reactions  . Zithromax [Azithromycin] Hallucination  . Other Other (See Comments)  Beef-Tears up my stomach.  . Sulfa (Sulfonamide Antibiotics) Rash   Social History:  Social History   Socioeconomic History  . Marital status: Married  Spouse name: Sharlotte Alamo  . Number of children: 2  . Years of education: 13  Occupational History  . Occupation: Retired- Seasonal- taxes  Tobacco Use  . Smoking status: Former Smoker  Packs/day: 1.50  Years: 27.00  Pack years: 40.50  Quit date: 03/27/1994  Years since quitting: 26.8  . Smokeless tobacco: Never Used  Vaping Use  . Vaping Use: Never used  Substance and Sexual Activity  . Alcohol use: Yes  Alcohol/week: 1.0 standard drink  Types: 1 Standard drinks or equivalent per week  Comment: Socially 1 or less a week  . Drug use: No  . Sexual activity: Yes  Partners: Male   Family History:  Family History  Problem Relation Age of Onset  . Myocardial Infarction (Heart attack) Father  . High blood pressure (Hypertension) Father  . COPD Mother  . High blood pressure (Hypertension) Mother  . Heart disease Mother  . High blood pressure (Hypertension) Brother  . Heart disease Brother  . Epilepsy Brother   Review of Systems:   A 10+ ROS was performed, reviewed, and the pertinent orthopaedic findings are documented in the  HPI.   Physical Examination:   BP (!) 150/90 (BP Location: Left upper arm, Patient Position: Sitting, BP Cuff Size: Large Adult)  Ht 152.4 cm (5')  Wt 100.5 kg (221 lb 9.6 oz)  LMP (LMP Unknown)  BMI 43.28 kg/m   Patient is a well-developed, well-nourished female in no acute distress. Patient has normal mood and affect. Patient is alert and oriented to person, place, and time.   HEENT: Atraumatic, normocephalic. Pupils equal and reactive to light. Extraocular motion intact. Noninjected sclera.  Cardiovascular: Regular rate and rhythm, with no murmurs, rubs, or gallops. Distal pulses palpable. No bruits.   Respiratory: Lungs clear to auscultation bilaterally.   Left Knee:  Soft tissue swelling: mild Effusion: none Erythema: none Crepitance: none Tenderness: lateral joint line Alignment: normal Mediolateral laxity: stable Atrophy: No significant atrophy.  Quadriceps tone was fair to good. Range of Motion: 107 degrees of flexion with 5 degrees of hyperextension  Sensation intact over the saphenous, lateral sural cutaneous, superficial fibular, and deep fibular nerve distributions.  Tests Performed/Reviewed:  X-rays  No new radiographs were obtained today. Previous radiographs were reviewed of the left knee and revealed  significant degenerative changes to the lateral compartment with loss of joint space and osteophyte formation noted. Medial compartment unicompartmental arthroplasty noted. Loss of patellofemoral joint space noted.  Impression:   ICD-10-CM  1. 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 Primary osteoarthritis of left knee M17.12  3. Severe obesity (BMI >= 40) (CMS-HCC) E66.01   Plan:   The patient has end-stage degenerative changes of the left knee in the lateral compartment. It was explained to the patient that the condition is progressive in nature. Having failed conservative treatment, the patient has elected to proceed with a conversion from a  unicompartmental knee arthroplasty to a left total knee arthroplasty. The patient will undergo a total joint arthroplasty with Dr. Ernest Pine. The risks of surgery, including blood clot and infection, were discussed with the patient. Measures to reduce these risks, including the use of anticoagulation, perioperative antibiotics, and early ambulation were discussed. The importance of postoperative physical therapy was discussed with the patient. The patient elects to proceed with surgery. The patient is instructed to stop all blood thinners prior to surgery. The patient is instructed to call the hospital the day before surgery to learn of the proper arrival time.   Contact our office with any questions or concerns. Follow up as indicated, or sooner should any new problems arise, if conditions worsen, or if they are otherwise concerned.   Michelene Gardener, PA-C [facility] [address] [place], [place] [address] Phone: [phone]  This note was generated in part with voice recognition software and I apologize for any typographical errors that were not detected and corrected.  Electronically signed by Michelene Gardener, PA at 01/22/2021 6:35 PM EDT

## 2021-01-27 NOTE — Anesthesia Procedure Notes (Signed)
Spinal  Patient location during procedure: OR Start time: 01/27/2021 11:30 AM End time: 01/27/2021 11:36 AM Reason for block: surgical anesthesia Staffing Performed: resident/CRNA  Resident/CRNA: Nelda Marseille, CRNA Preanesthetic Checklist Completed: patient identified, IV checked, site marked, risks and benefits discussed, surgical consent, monitors and equipment checked, pre-op evaluation and timeout performed Spinal Block Patient position: sitting Prep: Betadine Patient monitoring: heart rate, continuous pulse ox, blood pressure and cardiac monitor Approach: midline Location: L3-4 Injection technique: single-shot Needle Needle type: Whitacre and Introducer  Needle gauge: 25 G Needle length: 9 cm Assessment Events: CSF return Additional Notes Negative paresthesia. Negative blood return. Positive free-flowing CSF. Expiration date of kit checked and confirmed. Patient tolerated procedure well, without complications.

## 2021-01-28 ENCOUNTER — Encounter: Payer: Self-pay | Admitting: Orthopedic Surgery

## 2021-01-28 DIAGNOSIS — M1712 Unilateral primary osteoarthritis, left knee: Secondary | ICD-10-CM | POA: Diagnosis not present

## 2021-01-28 MED ORDER — HYDROCODONE-ACETAMINOPHEN 5-325 MG PO TABS: 1.0000 | ORAL_TABLET | ORAL | 0 refills | Status: DC | PRN

## 2021-01-28 MED ORDER — CELECOXIB 200 MG PO CAPS: 200.0000 mg | ORAL_CAPSULE | Freq: Two times a day (BID) | ORAL | 0 refills | Status: DC

## 2021-01-28 MED ORDER — ENOXAPARIN SODIUM 40 MG/0.4ML ~~LOC~~ SOLN: 40.0000 mg | SUBCUTANEOUS | 0 refills | Status: DC

## 2021-01-28 MED ORDER — ACETAMINOPHEN 500 MG PO TABS: 1000.0000 mg | ORAL_TABLET | Freq: Four times a day (QID) | ORAL | 0 refills | Status: DC | PRN

## 2021-01-28 NOTE — Progress Notes (Signed)
Met with the patient in the room, She has RW and single point cane at home, She is going to get a 3 in 1 brought to the room to take home She is set up with [facility], she has an appointment for [facility] in 2 weeks for Outpatient PT, she wants to go to [place] instead, I encouraged her to let the Doctor know She has transportation and can afford her medications

## 2021-01-28 NOTE — Progress Notes (Signed)
Physical Therapy Treatment Patient Details Name: Katrina Poole MRN: [id number] DOB: [birth year] Today's Date: 01/28/2021    History of Present Illness Pt is a 75 y.o. female s/p removal of L medial unicompartmental knee arthroplasty implants and conversion to L TKA secondary degenerative arthrosis L knee.  PMH includes asthma, htn, OA, Meniere syndrome, deaf R ear, neuromuscular disorder (finger tips go numb), RLS, vertigo, proximal humerus fx 01/21/2019, ACF C3-7 09/2018, carpal tunnel release, heart cath, L THA 11/25/2015, L medial unicompartmental knee arthroplasty 02/18/2015, and L3-5 microdiscectomy.    PT Comments    Pt was long sitting in bed with supportive spouse at bedside. Agrees to PT session and is cooperative and pleasant throughout. Was able to complete all acute PT goals and is cleared for safe DC from PT standpoint. Pt will benefit from continued skilled PT going forward to address deficits while maximizing independence.    Follow Up Recommendations  Home health PT;Other (comment);Outpatient PT (Pt wants HHPT prior to outpatient PT)     Equipment Recommendations  Rolling walker with 5" wheels;3in1 (PT)    Recommendations for Other Services OT consult     Precautions / Restrictions Precautions Precautions: Fall Required Braces or Orthoses: Knee Immobilizer - Left Knee Immobilizer - Left: Discontinue once straight leg raise with < 10 degree lag Restrictions Weight Bearing Restrictions: Yes LLE Weight Bearing: Weight bearing as tolerated    Mobility  Bed Mobility Overal bed mobility: Needs Assistance Bed Mobility: Supine to Sit     Supine to sit: Supervision     General bed mobility comments: Pt was able to exit bed with increased time from flat bedheight    Transfers Overall transfer level: Needs assistance Equipment used: Rolling walker (2 wheeled) Transfers: Sit to/from Stand Sit to Stand: Supervision         General transfer comment: No physical  assistance to stand from lowest bed height  Ambulation/Gait Ambulation/Gait assistance: Min guard;Supervision Gait Distance (Feet): 200 Feet Assistive device: Rolling walker (2 wheeled) Gait Pattern/deviations: Step-to pattern;Step-through pattern Gait velocity: decreased   General Gait Details: pt was able to progress form step to pattern to step through pattern. No LOB or difficulty.   Stairs Stairs: Yes Stairs assistance: Supervision Stair Management: Sideways;One rail Left Number of Stairs: 4 General stair comments: Pt was able to ascend/descend 4 stair without difficulty or safety concern      Balance Overall balance assessment: Needs assistance Sitting-balance support: No upper extremity supported;Feet supported Sitting balance-Leahy Scale: Good Sitting balance - Comments: steady sitting reaching within BOS   Standing balance support: Bilateral upper extremity supported;During functional activity Standing balance-Leahy Scale: Good Standing balance comment: no LOB with BUE support       Cognition Arousal/Alertness: Awake/alert Behavior During Therapy: WFL for tasks assessed/performed Overall Cognitive Status: Within Functional Limits for tasks assessed    General Comments: Pt is A and O x 4 with supportive spouse present throughout.      Exercises Total Joint Exercises Ankle Circles/Pumps: AROM;Strengthening;Both;10 reps;Supine Quad Sets: AROM;Strengthening;Both;10 reps;Supine Short Arc Quad: AAROM;Strengthening;Left;10 reps;Supine Heel Slides: AAROM;Strengthening;Left;10 reps;Supine Hip ABduction/ADduction: AAROM;Strengthening;Left;10 reps;Supine Straight Leg Raises: AROM;AAROM;Strengthening;Left;10 reps;Supine Goniometric ROM: L knee AROM 0-75 degrees    General Comments General comments (skin integrity, edema, etc.): L LE hemovac and dressings in place      Pertinent Vitals/Pain Pain Assessment: 0-10 Pain Score: 3  Pain Location: L knee Pain  Descriptors / Indicators: Aching;Discomfort;Tender;Sore Pain Intervention(s): Limited activity within patient's tolerance;Monitored during session;Premedicated before session;Repositioned;Ice applied  Home Living Family/patient expects to be discharged to:: Private residence Living Arrangements: Spouse/significant other Available Help at Discharge: Family;Available 24 hours/day Type of Home: House Home Access: Stairs to enter Entrance Stairs-Rails: Right;Left (no rail on small threshold into home--holds onto door typically for support) Home Layout: One level Home Equipment: Walker - 2 wheels;Cane - single point;Other (comment);Adaptive equipment (long handled reacher)      Prior Function Level of Independence: Independent      Comments: No falls in past 6 months.   PT Goals (current goals can now be found in the care plan section) Acute Rehab PT Goals Patient Stated Goal: to go home today PT Goal Formulation: With patient Time For Goal Achievement: 02/11/21 Potential to Achieve Goals: Fair Progress towards PT goals: Progressing toward goals    Frequency    BID      PT Plan Current plan remains appropriate       AM-PAC PT "6 Clicks" Mobility   Outcome Measure  Help needed turning from your back to your side while in a flat bed without using bedrails?: None Help needed moving from lying on your back to sitting on the side of a flat bed without using bedrails?: A Little Help needed moving to and from a bed to a chair (including a wheelchair)?: A Little Help needed standing up from a chair using your arms (e.g., wheelchair or bedside chair)?: A Little Help needed to walk in hospital room?: A Little Help needed climbing 3-5 steps with a railing? : A Little 6 Click Score: 19    End of Session Equipment Utilized During Treatment: Gait belt Activity Tolerance: Patient tolerated treatment well Patient left: in chair;with call bell/phone within reach;with chair alarm  set Nurse Communication: Mobility status;Precautions;Weight bearing status;Patient requests pain meds PT Visit Diagnosis: Other abnormalities of gait and mobility (R26.89);Muscle weakness (generalized) (M62.81);Difficulty in walking, not elsewhere classified (R26.2);Pain Pain - Right/Left: Left Pain - part of body: Knee     Time: 6237-6283 PT Time Calculation (min) (ACUTE ONLY): 35 min  Charges:  $Gait Training: 8-22 mins $Therapeutic Exercise: 8-22 mins                     Jetta Lout PTA 01/28/21, 4:03 PM

## 2021-01-28 NOTE — Progress Notes (Signed)
Pt and husband provided discharge instructions and tedhose applied to BLE. All questions and concerns addressed at this time. Pts mode of transport at discharge is wheelchair accompanied by her husband.   01/28/21 1732  Vitals  Temp 98 F (36.7 C)  BP 135/76  MAP (mmHg) 90  BP Location Right Arm  BP Method Automatic  Patient Position (if appropriate) Sitting  Pulse Rate 72  Pulse Rate Source Monitor  Resp 16  MEWS COLOR  MEWS Score Color Green  Oxygen Therapy  SpO2 96 %  O2 Device Room Air

## 2021-01-28 NOTE — Progress Notes (Signed)
  Subjective: 1 Day Post-Op Procedure(s) (LRB): CONVERSION OF LEFT PARTIAL KNEE TO LEFT TOTAL KNEE. (Left) Patient reports pain as well-controlled.   Patient is well, and has had no acute complaints or problems Plan is to go Home after hospital stay. Negative for chest pain and shortness of breath Fever: no Gastrointestinal: negative for nausea and vomiting.   Patient has not had a bowel movement.  Objective: Vital signs in last 24 hours: Temp:  [97 F (36.1 C)-97.8 F (36.6 C)] 97.6 F (36.4 C) (04/26 0417) Pulse Rate:  [61-74] 64 (04/26 0417) Resp:  [10-24] 16 (04/26 0417) BP: (130-168)/(65-107) 136/66 (04/26 0417) SpO2:  [90 %-100 %] 96 % (04/26 0417) Weight:  [99.3 kg-100.3 kg] 99.3 kg (04/25 2000)  Intake/Output from previous day:  Intake/Output Summary (Last 24 hours) at 01/28/2021 0827 Last data filed at 01/28/2021 6144 Gross per 24 hour  Intake 3130 ml  Output 600 ml  Net 2530 ml    Intake/Output this shift: No intake/output data recorded.  Labs: No results for input(s): HGB in the last 72 hours. No results for input(s): WBC, RBC, HCT, PLT in the last 72 hours. No results for input(s): NA, K, CL, CO2, BUN, CREATININE, GLUCOSE, CALCIUM in the last 72 hours. No results for input(s): LABPT, INR in the last 72 hours.   EXAM General - Patient is Alert, Appropriate and Oriented Extremity - Neurovascular intact Dorsiflexion/Plantar flexion intact Compartment soft Dressing/Incision -Postoperative dressing remains in place., Polar Care in place and working. , Hemovac in place.  Motor Function - intact, moving foot and toes well on exam.  Cardiovascular- Regular rate and rhythm, no murmurs/rubs/gallops Respiratory- Lungs clear to auscultation bilaterally Gastrointestinal- soft, nontender and hypoactive bowel sounds   Assessment/Plan: 1 Day Post-Op Procedure(s) (LRB): CONVERSION OF LEFT PARTIAL KNEE TO LEFT TOTAL KNEE. (Left) Active Problems:   History of revision  of total knee arthroplasty  Estimated body mass index is 44.22 kg/m as calculated from the following:   Height as of this encounter: 4\' 11"  (1.499 m).   Weight as of this encounter: 99.3 kg. Advance diet Up with therapy  Possible d/c this PM pending therapy progress.  DVT Prophylaxis - Lovenox, Ted hose and foot pumps Weight-Bearing as tolerated to left leg  , PA-C [facility] Orthopa[facility] Surgery 01/28/2021, 8:27 AM

## 2021-01-28 NOTE — Evaluation (Signed)
Physical Therapy Evaluation Patient Details Name: Katrina Poole MRN: [id number] DOB: 03/08/1946 Today's Date: 01/28/2021   History of Present Illness  Pt is a 75 y.o. female s/p removal of L medial unicompartmental knee arthroplasty implants and conversion to L TKA secondary degenerative arthrosis L knee.  PMH includes asthma, htn, OA, Meniere syndrome, deaf R ear, neuromuscular disorder (finger tips go numb), RLS, vertigo, proximal humerus fx 01/21/2019, ACF C3-7 09/2018, carpal tunnel release, heart cath, L THA 11/25/2015, L medial unicompartmental knee arthroplasty 02/18/2015, and L3-5 microdiscectomy.  Clinical Impression  Prior to hospital admission, pt was independent with ambulation; lives with her husband in 1 level home with multiple steps to enter.  Currently pt is CGA with transfers and ambulation 60 feet with RW (limited distance d/t L knee pain).  L knee pain 7/10 at rest beginning of session and 9/10 at rest end of session (pt received pain medication prior to PT session and nurse notified pt requesting pain medication end of PT session).  L knee AROM 0-75 degrees.  Tolerated LE ex's fairly well with assist as needed.  Pt reporting only getting "20 minutes" of sleep last night and is hopeful to discharge home today where she can get better rest.  Pt would benefit from skilled PT to address noted impairments and functional limitations (see below for any additional details).  Pt reports having OP PT set up already.    Follow Up Recommendations Outpatient PT    Equipment Recommendations  Rolling walker with 5" wheels;3in1 (PT)    Recommendations for Other Services OT consult     Precautions / Restrictions Precautions Precautions: Fall Required Braces or Orthoses: Knee Immobilizer - Left Knee Immobilizer - Left: Discontinue once straight leg raise with < 10 degree lag Restrictions Weight Bearing Restrictions: Yes LLE Weight Bearing: Weight bearing as tolerated      Mobility   Bed Mobility         General bed mobility comments: Deferred (pt up in recliner beginning/end of session)    Transfers Overall transfer level: Needs assistance Equipment used: Rolling walker (2 wheeled) Transfers: Sit to/from Stand Sit to Stand: Min guard Stand pivot transfers: Min guard       General transfer comment: occasional vc's for UE/LE positioning  Ambulation/Gait Ambulation/Gait assistance: Min guard Gait Distance (Feet): 60 Feet Assistive device: Rolling walker (2 wheeled) Gait Pattern/deviations: Step-to pattern Gait velocity: decreased   General Gait Details: initial vc's for gait technique; steady with RW; decreased stance time L LE  Stairs            Wheelchair Mobility    Modified Rankin (Stroke Patients Only)       Balance Overall balance assessment: Needs assistance Sitting-balance support: No upper extremity supported;Feet supported Sitting balance-Leahy Scale: Good Sitting balance - Comments: steady sitting reaching within BOS   Standing balance support: Single extremity supported Standing balance-Leahy Scale: Fair Standing balance comment: pt requiring at least single UE support for static standing balance                             Pertinent Vitals/Pain Pain Assessment: 0-10 Pain Score: 9  Pain Location: L knee Pain Descriptors / Indicators: Aching;Discomfort;Tender;Sore Pain Intervention(s): Limited activity within patient's tolerance;Monitored during session;Premedicated before session;Repositioned;Patient requesting pain meds-RN notified;Other (comment) (polar care applied and activated)  Vitals (HR and O2 on room air) stable and WFL throughout treatment session.    Home Living Family/patient expects to be  discharged to:: Private residence Living Arrangements: Spouse/significant other Available Help at Discharge: Family;Available 24 hours/day Type of Home: House Home Access: Stairs to enter Entrance Stairs-Rails:  Right;Left (no rail on small threshold into home--holds onto door typically for support) Entrance Stairs-Number of Steps: 3 steps to platform then 3 more steps to platform and then 1 small 2 inch threshold into home Home Layout: One level Home Equipment: Walker - 2 wheels;Cane - single point;Other (comment);Adaptive equipment (long handled reacher)      Prior Function Level of Independence: Independent         Comments: No falls in past 6 months.     Hand Dominance   Dominant Hand: Right    Extremity/Trunk Assessment   Upper Extremity Assessment Upper Extremity Assessment: Overall WFL for tasks assessed    Lower Extremity Assessment Lower Extremity Assessment: LLE deficits/detail (R LE WFL) LLE Deficits / Details: good L quad set isometric contraction; able to perform L LE SLR independently LLE: Unable to fully assess due to pain    Cervical / Trunk Assessment Cervical / Trunk Assessment: Normal  Communication   Communication: No difficulties  Cognition Arousal/Alertness: Awake/alert Behavior During Therapy: WFL for tasks assessed/performed Overall Cognitive Status: Within Functional Limits for tasks assessed                                 General Comments: pleasant and appearing motivated      General Comments General comments (skin integrity, edema, etc.): L LE hemovac and dressings in place.  Nursing cleared pt for participation in physical therapy.  Pt agreeable to PT session.    Exercises Total Joint Exercises Ankle Circles/Pumps: AROM;Strengthening;Both;10 reps;Supine Quad Sets: AROM;Strengthening;Both;10 reps;Supine Short Arc Quad: AAROM;Strengthening;Left;10 reps;Supine Heel Slides: AAROM;Strengthening;Left;10 reps;Supine Hip ABduction/ADduction: AAROM;Strengthening;Left;10 reps;Supine Straight Leg Raises: AROM;AAROM;Strengthening;Left;10 reps;Supine Goniometric ROM: L knee AROM 0-75 degrees   Assessment/Plan    PT Assessment Patient  needs continued PT services  PT Problem List Decreased strength;Decreased range of motion;Decreased activity tolerance;Decreased balance;Decreased mobility;Decreased knowledge of use of DME;Decreased knowledge of precautions;Pain;Decreased skin integrity       PT Treatment Interventions DME instruction;Gait training;Stair training;Functional mobility training;Therapeutic activities;Therapeutic exercise;Balance training;Patient/family education    PT Goals (Current goals can be found in the Care Plan section)  Acute Rehab PT Goals Patient Stated Goal: to go home today PT Goal Formulation: With patient Time For Goal Achievement: 02/11/21 Potential to Achieve Goals: Fair    Frequency BID   Barriers to discharge        Co-evaluation               AM-PAC PT "6 Clicks" Mobility  Outcome Measure Help needed turning from your back to your side while in a flat bed without using bedrails?: None Help needed moving from lying on your back to sitting on the side of a flat bed without using bedrails?: A Little Help needed moving to and from a bed to a chair (including a wheelchair)?: A Little Help needed standing up from a chair using your arms (e.g., wheelchair or bedside chair)?: A Little Help needed to walk in hospital room?: A Little Help needed climbing 3-5 steps with a railing? : A Little 6 Click Score: 19    End of Session Equipment Utilized During Treatment: Gait belt Activity Tolerance: Patient limited by pain Patient left: in chair;with call bell/phone within reach;with chair alarm set;with SCD's reapplied;Other (comment) (L heel floating via towel roll;  R heel floating via pillow support) Nurse Communication: Mobility status;Precautions;Weight bearing status;Patient requests pain meds PT Visit Diagnosis: Other abnormalities of gait and mobility (R26.89);Muscle weakness (generalized) (M62.81);Difficulty in walking, not elsewhere classified (R26.2);Pain Pain - Right/Left:  Left Pain - part of body: Knee    Time: 2563-8937 PT Time Calculation (min) (ACUTE ONLY): 44 min   Charges:   PT Evaluation $PT Eval Low Complexity: 1 Low PT Treatments $Gait Training: 8-22 mins $Therapeutic Exercise: 8-22 mins       Hendricks Limes, PT 01/28/21, 12:49 PM

## 2021-01-28 NOTE — Discharge Summary (Signed)
Physician Discharge Summary  Patient ID: Katrina Poole MRN: [id number] DOB/AGE: July 12, 1946 75 y.o.  Admit date: 01/27/2021 Discharge date: 01/28/2021  Admission Diagnoses:  History of revision of total knee arthroplasty [Z96.659]  Surgeries:Procedure(s):  Removal of left medial unicompartmental knee arthroplasty implants and conversion to a left total knee arthroplasty using computer-assisted navigation  SURGEON:  Jena Gauss. M.D.  ASSISTANT: Baldwin Jamaica, PA-C (present and scrubbed throughout the case, critical for assistance with exposure, retraction, instrumentation, and closure)  ANESTHESIA: spinal and general  ESTIMATED BLOOD LOSS: 150 mL  FLUIDS REPLACED: 2300 mL of crystalloid  TOURNIQUET TIME: #1-120 minutes           #2-29 minutes  DRAINS: 2 medium Hemovac drains  SOFT TISSUE RELEASES: Anterior cruciate ligament, posterior cruciate ligament, deep medial collateral ligament, patellofemoral ligament  IMPLANTS UTILIZED: DePuy Attune size 4 revision CRS femoral component (cemented), 18 mm x 60 mm Attune revision press-fit stem, 8 mm medial distal femoral augment, 4 mm Attune lateral distal femoral augment size 4 rotating platform tibial component (cemented), 35 mm medialized dome patella (cemented), and a 12 mm stabilized rotating platform polyethylene insert.  Discharge Diagnoses: Patient Active Problem List   Diagnosis Date Noted  . 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  . Essential hypertension 08/16/2020  . Meniere's disease 08/16/2020  . Restless leg syndrome 08/16/2020  . Primary insomnia 08/16/2020  . Mild intermittent asthma without complication 08/16/2020  . Diverticulosis of colon 08/16/2020  . HNP (herniated nucleus pulposus), lumbar 04/29/2020  . Gastroesophageal reflux disease without esophagitis 01/24/2020  . Anemia of chronic disease 01/24/2020  . 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 Osteoarthritis of knee  02/06/2018  . S/P total hip arthroplasty 11/25/2015  . Status post arthroscopic surgery of left knee 08/15/2015    Past Medical History:  Diagnosis Date  . Arthritis    osteo of left hip  . Asthma    humidity and exercise during summer  . GERD (gastroesophageal reflux disease)   . Hypertension    controlled on meds  . Meniere syndrome    hearing deaf right ear  . Neuromuscular disorder (HCC)    finger tips go numb, neck vertebrae  . Restless leg syndrome   . Vertigo   . Wears dentures    full dentures on top, partial bottom     Transfusion:    Consultants (if any):   Discharged Condition: Improved  Hospital Course: Katrina Poole is an 75 y.o. female who was admitted 01/27/2021 with a diagnosis of left knee osteoarthritis, s/p medial unicompartmental knee arthroplasty  and went to the operating room on 01/27/2021 and underwent conversion to left total knee arthoplasty. The patient received perioperative antibiotics for prophylaxis (see below). The patient tolerated the procedure well and was transported to PACU in stable condition. After meeting PACU criteria, the patient was subsequently transferred to the Orthopaedics/Rehabilitation unit.   The patient received DVT prophylaxis in the form of early mobilization, Lovenox, Foot Pumps and TED hose. A sacral pad had been placed and heels were elevated off of the bed with rolled towels in order to protect skin integrity. Foley catheter was discontinued on postoperative day #0. Wound drains were discontinued on postoperative day #2. The surgical incision was healing well without signs of infection.  Physical therapy was initiated postoperatively for transfers, gait training, and strengthening. Occupational therapy was initiated for activities of daily living and evaluation for assisted devices. Rehabilitation goals were reviewed in detail with the  patient. The patient made steady progress with physical therapy and physical therapy  recommended discharge to Home.   The patient achieved the preliminary goals of this hospitalization and was felt to be medically and orthopaedically appropriate for discharge.  She was given perioperative antibiotics:  Anti-infectives (From admission, onward)   Start     Dose/Rate Route Frequency Ordered Stop   01/27/21 2115  ceFAZolin (ANCEF) IVPB 2g/100 mL premix        2 g 200 mL/hr over 30 Minutes Intravenous Every 6 hours 01/27/21 2021 01/28/21 0914   01/27/21 0941  ceFAZolin (ANCEF) 2-4 GM/100ML-% IVPB       Note to Pharmacy: Rayann Heman   : cabinet override      01/27/21 0941 01/28/21 0408   01/27/21 0600  ceFAZolin (ANCEF) IVPB 2g/100 mL premix        2 g 200 mL/hr over 30 Minutes Intravenous On call to O.R. 01/27/21 0109 01/27/21 1544    .  Recent vital signs:  Vitals:   01/28/21 1123 01/28/21 1541  BP: 131/67 (!) 132/58  Pulse: (!) 59 64  Resp: 15 16  Temp: 98.1 F (36.7 C) 97.7 F (36.5 C)  SpO2: 96% 94%    Recent laboratory studies:  No results for input(s): WBC, HGB, HCT, PLT, K, CL, CO2, BUN, CREATININE, GLUCOSE, CALCIUM, LABPT, INR in the last 72 hours.  Diagnostic Studies: DG Knee Left Port  Result Date: 01/27/2021 CLINICAL DATA:  Status post total left knee replacement. EXAM: PORTABLE LEFT KNEE - 1-2 VIEW COMPARISON:  None. FINDINGS: Left knee arthroplasty in expected alignment. No periprosthetic lucency or fracture. There has been patellar resurfacing. Recent postsurgical change includes air and edema in the soft tissues and joint space. Surgical drain in the suprapatellar joint space. Anterior skin staples. Ghost tracks in the proximal tibia. IMPRESSION: Left knee arthroplasty without immediate postoperative complication. Electronically Signed   By: Narda Rutherford M.D.   On: 01/27/2021 19:41    Discharge Medications:   Allergies as of 01/28/2021      Reactions   Sulfa Antibiotics Other (See Comments)   Hallucinations   Zithromax [azithromycin]  Hives   Oxycodone Other (See Comments)   Makes head foggy and interferes with Meniere's syndrome.     Beef-derived Products Other (See Comments)   Tears up my stomach.      Medication List    STOP taking these medications   meloxicam 7.5 MG tablet Commonly known as: MOBIC     TAKE these medications   acetaminophen 500 MG tablet Commonly known as: TYLENOL Take 2 tablets (1,000 mg total) by mouth every 6 (six) hours as needed (DO NOT EXCEED 4000 MG DAILY FROM ALL SOURCES). What changed: reasons to take this   albuterol 108 (90 Base) MCG/ACT inhaler Commonly known as: VENTOLIN HFA Inhale 2 puffs into the lungs every 6 (six) hours as needed for wheezing or shortness of breath.   alum hydroxide-mag trisilicate 80-20 MG Chew chewable tablet Commonly known as: GAVISCON Chew 1 tablet by mouth daily as needed for indigestion or heartburn.   amLODipine 5 MG tablet Commonly known as: NORVASC Take 5 mg by mouth every morning.   celecoxib 200 MG capsule Commonly known as: CELEBREX Take 1 capsule (200 mg total) by mouth 2 (two) times daily.   cephALEXin 500 MG capsule Commonly known as: KEFLEX Take 500 mg by mouth as needed. TAKE 4 PRIOR TO DENTAL PROCEDURES   cholecalciferol 25 MCG (1000 UNIT) tablet Commonly known as:  VITAMIN D3 Take 1,000 Units by mouth at bedtime.   clonazePAM 0.5 MG tablet Commonly known as: KLONOPIN Take 1 tablet (0.5 mg total) by mouth daily as needed for anxiety. What changed: when to take this   clotrimazole 1 % cream Commonly known as: LOTRIMIN Apply 1 application topically daily as needed.   enoxaparin 40 MG/0.4ML injection Commonly known as: LOVENOX Inject 0.4 mLs (40 mg total) into the skin daily for 14 days.   fosinopril 40 MG tablet Commonly known as: MONOPRIL Take 40 mg by mouth every morning.   gabapentin 300 MG capsule Commonly known as: NEURONTIN Take 2 capsules (600 mg total) by mouth at bedtime.   hydrochlorothiazide 25 MG  tablet Commonly known as: HYDRODIURIL Take 1 tablet (25 mg total) by mouth every morning.   HYDROcodone-acetaminophen 5-325 MG tablet Commonly known as: NORCO/VICODIN Take 1-2 tablets by mouth every 4 (four) hours as needed for moderate pain (pain score 4-6).   meclizine 12.5 MG tablet Commonly known as: ANTIVERT Take 12.5 mg by mouth 3 (three) times daily as needed for dizziness. Dr. Eduard Clos   metoprolol succinate 50 MG 24 hr tablet Commonly known as: TOPROL-XL Take 50 mg by mouth every morning. Take with or immediately following a meal.   multivitamin tablet Take 1 tablet by mouth every morning.   omeprazole 20 MG capsule Commonly known as: PRILOSEC Take 1 capsule (20 mg total) by mouth 2 (two) times daily before a meal.   zolpidem 5 MG tablet Commonly known as: AMBIEN Take 1 tablet (5 mg total) by mouth at bedtime.            Durable Medical Equipment  (From admission, onward)         Start     Ordered   01/27/21 2021  DME Walker rolling  Once       Question:  Patient needs a walker to treat with the following condition  Answer:  Total knee replacement status   01/27/21 2021   01/27/21 2021  DME Bedside commode  Once       Question:  Patient needs a bedside commode to treat with the following condition  Answer:  Total knee replacement status   01/27/21 2021          Disposition: Home with home health PT     Follow-up Information    Tera Partridge, [place] On 02/10/2021.   Specialty: Physician Assistant Why: at 1:15pm Contact information: [address] [place] [address] [phone]        Donato Heinz, MD On 03/18/2021.   Specialty: Orthopedic Surgery Why: at 2:30pm Contact information: [address] [facility] [address] [place] [address] [phone]                Lasandra Beech, PA-C 01/28/2021, 5:31 PM

## 2021-01-28 NOTE — Progress Notes (Signed)
Patient attempted using the bone foam. Pt is not able to tolerate use after an hour.

## 2021-01-28 NOTE — Evaluation (Signed)
Occupational Therapy Evaluation Patient Details Name: Katrina Poole MRN: [id number] DOB: 04-05-1946 Today's Date: 01/28/2021    History of Present Illness Pt is 75 y/o female s/p : L TKA. PMH includes HTN,vertigo, asthma and L 3-5 microdiscetomy.   Clinical Impression   Patient presenting with decreased I in self care, balance, functional mobility/transfers, endurance, and safety awareness. Patient reports being independent and living with husband  PTA. Pt reports having 7 STE her 1 story home. She has SPC, RW, sock aide, and LH reacher but did not use prior to admission. Patient currently functioning at min A overall with use of RW for transfer onto [facility]. Pt was unable to void this session. Pt ambulates 10' from [facility] to recliner chair with min guard and RW. Pt does not appear to be putting full weight through L LE with mobility secondary to pain. Pt is very motivated to return home with husband who is available to assist as needed 24/7 per pt report. OT educated and demonstrated how to utilize polar care at home with paper handout being provided as well. Pt verbalized understanding.   Patient will benefit from acute OT to increase overall independence in the areas of ADLs, functional mobility, and safety awareness in order to safely discharge home with husband.    Follow Up Recommendations  No OT follow up;Supervision - Intermittent    Equipment Recommendations  3 in 1 bedside commode;Other (comment) (for use in shower)       Precautions / Restrictions Precautions Precautions: Fall Restrictions Weight Bearing Restrictions: Yes LLE Weight Bearing: Weight bearing as tolerated      Mobility Bed Mobility Overal bed mobility: Needs Assistance Bed Mobility: Supine to Sit     Supine to sit: Min guard;HOB elevated     General bed mobility comments: min cuing for technique    Transfers Overall transfer level: Needs assistance Equipment used: Rolling walker (2 wheeled) Transfers:  Sit to/from [employer] Sit to Stand: Min guard Stand pivot transfers: Min guard            Balance Overall balance assessment: Needs assistance Sitting-balance support: Feet unsupported Sitting balance-Leahy Scale: Good Sitting balance - Comments: feet do not touch secondary to pt being 4'11     Standing balance-Leahy Scale: Fair Standing balance comment: reliance on UE support                           ADL either performed or assessed with clinical judgement   ADL Overall ADL's : Needs assistance/impaired Eating/Feeding: Independent   Grooming: Wash/dry hands;Wash/dry face;Set up;Oral care;Sitting               Lower Body Dressing: Minimal assistance   Toilet Transfer: Min guard;BSC;RW;Ambulation     Toileting - Clothing Manipulation Details (indicate cue type and reason): unable to void     Functional mobility during ADLs: Min guard;Rolling walker       Vision Patient Visual Report: No change from baseline              Pertinent Vitals/Pain Pain Assessment: 0-10 Pain Score: 7  Pain Location: L knee Pain Descriptors / Indicators: Aching;Discomfort;Dull Pain Intervention(s): Limited activity within patient's tolerance;Repositioned;Monitored during session;RN gave pain meds during session     Hand Dominance Right   Extremity/Trunk Assessment Upper Extremity Assessment Upper Extremity Assessment: Overall WFL for tasks assessed   Lower Extremity Assessment Lower Extremity Assessment: Defer to PT evaluation  Communication Communication Communication: No difficulties   Cognition Arousal/Alertness: Awake/alert Behavior During Therapy: WFL for tasks assessed/performed Overall Cognitive Status: Within Functional Limits for tasks assessed                                 General Comments: very pleasant and cooperative              Home Living Family/patient expects to be discharged to:: Private  residence Living Arrangements: Spouse/significant other Available Help at Discharge: Family;Available 24 hours/day Type of Home: House Home Access: Stairs to enter [employer] of Steps: 7 STE ( 3 steps-platform-3 more steps-platform- 1step into door) Entrance Stairs-Rails: Left;Right Home Layout: One level     Bathroom Shower/Tub: Producer, television/film/video: Standard     Home Equipment: Environmental consultant - 2 wheels;Cane - single point;Other (comment);Adaptive equipment Adaptive Equipment: Reacher;Sock aid        Prior Functioning/Environment Level of Independence: Independent                 OT Problem List: Decreased strength;Impaired balance (sitting and/or standing);Decreased knowledge of precautions;Pain;Decreased activity tolerance;Decreased knowledge of use of DME or AE;Decreased safety awareness      OT Treatment/Interventions: Self-care/ADL training;DME and/or AE instruction;Manual therapy;Therapeutic exercise;Balance training;Patient/family education;Therapeutic activities;Energy conservation    OT Goals(Current goals can be found in the care plan section) Acute Rehab OT Goals Patient Stated Goal: to go home today OT Goal Formulation: With patient Time For Goal Achievement: 02/11/21 Potential to Achieve Goals: Good ADL Goals Pt Will Perform Grooming: with modified independence;standing Pt Will Perform Lower Body Dressing: sit to/from stand;with supervision;with adaptive equipment Pt Will Transfer to Toilet: with supervision;ambulating Pt Will Perform Toileting - Clothing Manipulation and hygiene: with supervision;sit to/from stand  OT Frequency: Min 2X/week   Barriers to D/C:    none known at this time          AM-PAC OT "6 Clicks" Daily Activity     Outcome Measure Help from another person eating meals?: None Help from another person taking care of personal grooming?: None Help from another person toileting, which includes using toliet, bedpan,  or urinal?: A Little Help from another person bathing (including washing, rinsing, drying)?: A Little Help from another person to put on and taking off regular upper body clothing?: None Help from another person to put on and taking off regular lower body clothing?: A Little 6 Click Score: 21   End of Session Equipment Utilized During Treatment: Rolling walker;Other (comment) [facility]) Nurse Communication: Mobility status;Precautions  Activity Tolerance: Patient tolerated treatment well Patient left: in chair;with call bell/phone within reach;with nursing/sitter in room  OT Visit Diagnosis: Unsteadiness on feet (R26.81);Muscle weakness (generalized) (M62.81);Pain Pain - Right/Left: Left Pain - part of body: Knee                Time: 4854-6270 OT Time Calculation (min): 28 min Charges:  OT General Charges $OT Visit: 1 Visit OT Evaluation $OT Eval Low Complexity: 1 Low OT Treatments $Self Care/Home Management : 8-22 mins  Jackquline Denmark, MS, OTR/L , CBIS ascom [phone]  01/28/21, 9:51 AM

## 2021-01-28 NOTE — Progress Notes (Signed)
Pt sat up on side of bed for approximately 45 min, from 0540-0625am.

## 2021-02-02 ENCOUNTER — Encounter: Payer: Self-pay | Admitting: Internal Medicine

## 2021-02-02 LAB — AEROBIC/ANAEROBIC CULTURE W GRAM STAIN (SURGICAL/DEEP WOUND): Culture: NO GROWTH

## 2021-02-26 ENCOUNTER — Encounter: Payer: Self-pay | Admitting: Internal Medicine

## 2021-02-26 ENCOUNTER — Other Ambulatory Visit: Payer: Self-pay | Admitting: Internal Medicine

## 2021-02-26 DIAGNOSIS — F5101 Primary insomnia: Secondary | ICD-10-CM

## 2021-02-26 MED ORDER — ZOLPIDEM TARTRATE 5 MG PO TABS: 5.0000 mg | ORAL_TABLET | Freq: Every day | ORAL | 1 refills | Status: DC

## 2021-03-30 ENCOUNTER — Other Ambulatory Visit: Payer: Self-pay | Admitting: Internal Medicine

## 2021-03-30 ENCOUNTER — Encounter: Payer: Self-pay | Admitting: Internal Medicine

## 2021-03-30 DIAGNOSIS — F411 Generalized anxiety disorder: Secondary | ICD-10-CM

## 2021-03-30 MED ORDER — ACETAMINOPHEN 500 MG PO TABS: 1000.0000 mg | ORAL_TABLET | Freq: Four times a day (QID) | ORAL | 2 refills | Status: AC | PRN

## 2021-03-30 MED ORDER — CLONAZEPAM 0.5 MG PO TABS: 0.5000 mg | ORAL_TABLET | Freq: Every day | ORAL | 2 refills | Status: DC

## 2021-04-17 ENCOUNTER — Ambulatory Visit (INDEPENDENT_AMBULATORY_CARE_PROVIDER_SITE_OTHER): Payer: Medicare Other | Admitting: Internal Medicine

## 2021-04-17 ENCOUNTER — Encounter: Payer: Self-pay | Admitting: Internal Medicine

## 2021-04-17 ENCOUNTER — Other Ambulatory Visit: Payer: Self-pay

## 2021-04-17 VITALS — BP 134/66 | HR 61 | Ht 59.0 in | Wt 220.0 lb

## 2021-04-17 DIAGNOSIS — Z6841 Body Mass Index (BMI) 40.0 and over, adult: Secondary | ICD-10-CM

## 2021-04-17 DIAGNOSIS — F5101 Primary insomnia: Secondary | ICD-10-CM | POA: Diagnosis not present

## 2021-04-17 DIAGNOSIS — G2581 Restless legs syndrome: Secondary | ICD-10-CM

## 2021-04-17 DIAGNOSIS — I10 Essential (primary) hypertension: Secondary | ICD-10-CM | POA: Diagnosis not present

## 2021-04-17 NOTE — Progress Notes (Addendum)
Date:  04/17/2021   Name:  Katrina Poole   DOB:  March 06, 1946   MRN:  [id number]   Chief Complaint: Hypertension  Hypertension This is a chronic problem. The problem is controlled (at other offices it has been 112/78). Pertinent negatives include no chest pain, headaches, palpitations or shortness of breath. Past treatments include beta blockers, calcium channel blockers, diuretics and ACE inhibitors. The current treatment provides significant improvement. There are no compliance problems.   Insomnia The problem occurs nightly. Treatments tried: ambien and clonazepam.  RLS - on gabapentin - reduced dose to 300mg .  Can consider lowering further if needed.  Lab Results  Component Value Date   CREATININE 0.86 01/22/2021   BUN 21 01/22/2021   NA 135 01/22/2021   K 3.7 01/22/2021   CL 98 01/22/2021   CO2 25 01/22/2021   Lab Results  Component Value Date   CHOL 200 04/02/2020   HDL 57 04/02/2020   LDLCALC 119 04/02/2020   TRIG 119 04/02/2020   No results found for: TSH No results found for: HGBA1C Lab Results  Component Value Date   WBC 7.4 01/22/2021   HGB 12.5 01/22/2021   HCT 37.4 01/22/2021   MCV 91.0 01/22/2021   PLT 295 01/22/2021   Lab Results  Component Value Date   ALT 18 01/22/2021   AST 19 01/22/2021   ALKPHOS 86 01/22/2021   BILITOT 0.7 01/22/2021     Review of Systems  Constitutional:  Negative for fatigue and unexpected weight change.  HENT:  Negative for nosebleeds.   Eyes:  Negative for visual disturbance.  Respiratory:  Negative for cough, chest tightness, shortness of breath and wheezing.   Cardiovascular:  Negative for chest pain, palpitations and leg swelling.  Gastrointestinal:  Negative for abdominal pain, constipation and diarrhea.  Neurological:  Negative for dizziness, weakness, light-headedness and headaches.  Psychiatric/Behavioral:  The patient has insomnia.    Patient Active Problem List   Diagnosis Date Noted   History of  revision of total knee arthroplasty 01/27/2021   Essential hypertension 08/16/2020   Meniere's disease 08/16/2020   Restless leg syndrome 08/16/2020   Primary insomnia 08/16/2020   Mild intermittent asthma without complication 08/16/2020   Diverticulosis of colon 08/16/2020   HNP (herniated nucleus pulposus), lumbar 04/29/2020   Gastroesophageal reflux disease without esophagitis 01/24/2020   Anemia of chronic disease 01/24/2020   Spondylosis of cervical spine 03/16/2018   Osteoarthritis of knee 02/06/2018   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     Beef-Derived Products Other (See Comments)    Tears up my stomach.    Past Surgical History:  Procedure Laterality Date   ANTERIOR CERVICAL DECOMP/DISCECTOMY FUSION N/A 03/16/2018   Procedure: Anterior Cervical Discectomy Fusion - Cervical Three- Cervical Four, Cervical Four- Cervical Five, Cervical Five-Cervical Six, Cervical Six- Cerival Seven;  Surgeon: 05/16/2018, MD;  Location: [facility] OR;  Service: Neurosurgery;  Laterality: N/A;  Anterior Cervical Discectomy Fusion - Cervical Three- Cervical Four, Cervical Four- Cervical Five, Cervical Five-Cervical Six, Cervical Six- Ceriva   CARDIAC CATHETERIZATION     x2 both normal   CARPAL TUNNEL RELEASE     COLONOSCOPY     COLONOSCOPY WITH PROPOFOL N/A  06/04/2016   Procedure: COLONOSCOPY WITH PROPOFOL;  Surgeon: Christena DeemMartin U Skulskie, MD;  [facility];  Service: Endoscopy;  Laterality: N/A;   DILATION AND CURETTAGE OF UTERUS     ECTOPIC PREGNANCY SURGERY     ESOPHAGOGASTRODUODENOSCOPY (EGD) WITH PROPOFOL N/A 06/04/2016   Procedure: ESOPHAGOGASTRODUODENOSCOPY (EGD) WITH PROPOFOL;  Surgeon: Christena DeemMartin U Skulskie, MD;   [facility]COPY;  Service: Endoscopy;  Laterality: N/A;   exploratory laparoscopy     infertility surgery   HALLUX VALGUS CORRECTION Bilateral    JOINT REPLACEMENT Bilateral    partial knee   KNEE ARTHROPLASTY Left 01/27/2021   Procedure: CONVERSION OF LEFT PARTIAL KNEE TO LEFT TOTAL KNEE.;  Surgeon: Donato HeinzHooten, James P, MD;  Location: [facility] ORS;  Service: Orthopedics;  Laterality: Left;   KNEE ARTHROSCOPY Left 07/19/2015   Procedure: Arthroscopic limited synovectomy along with lateral compartment and retropatellar chondral debridement plus removal of several small loose bodies;  Surgeon: Erin SonsHarold Kernodle, MD;  [facility];  Service: Orthopedics;  Laterality: Left;  1ST CASE PER CECE   LUMBAR LAMINECTOMY/DECOMPRESSION MICRODISCECTOMY Right 04/29/2020   Procedure: Microdiscectomy - Lumbar Three-Lumbar Four - Lumbar Four-Lumbar Five - right;  Surgeon: Donalee Citrinram, Gary, MD;  [facility];  Service: Neurosurgery;  Laterality: Right;  Microdiscectomy - Lumbar Three-Lumbar Four - Lumbar Four-Lumbar Five - right   MEDIAL PARTIAL KNEE REPLACEMENT Bilateral    SHOULDER SURGERY Right    TOTAL HIP ARTHROPLASTY Left 11/25/2015   Procedure: TOTAL HIP ARTHROPLASTY;  Surgeon: Donato HeinzJames P Hooten, MD;  Location: [facility] ORS;  Service: Orthopedics;  Laterality: Left;   TRIGGER FINGER RELEASE Bilateral     Social History   Tobacco Use   Smoking status: Former    Packs/day: 1.50    Years: 30.00    Pack years: 45.00    Types: Cigarettes    Quit date: 11/12/1990    Years since quitting: 30.4   Smokeless tobacco: Never  Vaping Use   Vaping Use: Never used  Substance Use Topics   Alcohol use: Yes    Alcohol/week: 1.0 standard drink    Types: 1 Standard drinks or equivalent per week    Comment: OCC [place]   Drug use: No     Medication list has been reviewed and updated.  Current Meds  Medication Sig   acetaminophen (TYLENOL) 500 MG tablet Take 2 tablets (1,000 mg total) by mouth  every 6 (six) hours as needed (DO NOT EXCEED 4000 MG DAILY FROM ALL SOURCES).   albuterol (VENTOLIN HFA) 108 (90 Base) MCG/ACT inhaler Inhale 2 puffs into the lungs every 6 (six) hours as needed for wheezing or shortness of breath.   alum hydroxide-mag trisilicate (GAVISCON) 80-20 MG CHEW chewable tablet Chew 1 tablet by mouth daily as needed for indigestion or heartburn.    amLODipine (NORVASC) 5 MG tablet Take 5 mg by mouth every morning.    cephALEXin (KEFLEX) 500 MG capsule Take 500 mg by mouth as needed. TAKE 4 PRIOR TO DENTAL PROCEDURES   cholecalciferol (VITAMIN D3) 25 MCG (1000 UNIT) tablet Take 1,000 Units by mouth at bedtime.   clonazePAM (KLONOPIN) 0.5 MG tablet Take 1 tablet (0.5 mg total) by mouth at bedtime.   clotrimazole (LOTRIMIN) 1 % cream Apply 1 application topically daily as needed.   fosinopril (MONOPRIL) 40 MG tablet Take 40 mg by mouth every morning.    gabapentin (NEURONTIN) 300 MG capsule Take 2 capsules (600 mg total) by mouth at bedtime.   hydrochlorothiazide (HYDRODIURIL) 25  MG tablet Take 1 tablet (25 mg total) by mouth every morning.   meclizine (ANTIVERT) 12.5 MG tablet Take 12.5 mg by mouth 3 (three) times daily as needed for dizziness. Dr. Eduard Clos   metoprolol succinate (TOPROL-XL) 50 MG 24 hr tablet Take 50 mg by mouth every morning. Take with or immediately following a meal.   Multiple Vitamin (MULTIVITAMIN) tablet Take 1 tablet by mouth every morning.    omeprazole (PRILOSEC) 20 MG capsule Take 1 capsule (20 mg total) by mouth 2 (two) times daily before a meal.   zolpidem (AMBIEN) 5 MG tablet Take 1 tablet (5 mg total) by mouth at bedtime.    PHQ 2/9 Scores 04/17/2021 12/26/2020 10/07/2020 09/16/2020  PHQ - 2 Score 0 0 0 0  PHQ- 9 Score 1 0 - 0    GAD 7 : Generalized Anxiety Score 04/17/2021 12/26/2020 09/16/2020 08/16/2020  Nervous, Anxious, on Edge 0 0 2 0  Control/stop worrying 0 0 0 0  Worry too much - different things 0 0 0 0  Trouble relaxing 0 0 0 0   Restless 0 0 0 0  Easily annoyed or irritable 0 0 0 0  Afraid - awful might happen 0 0 0 0  Total GAD 7 Score 0 0 2 0  Anxiety Difficulty Not difficult at all - - -    BP Readings from Last 3 Encounters:  04/17/21 134/66  01/28/21 135/76  01/22/21 (!) 138/59    Physical Exam Vitals and nursing note reviewed.  Constitutional:      General: She is not in acute distress.    Appearance: She is well-developed.  HENT:     Head: Normocephalic and atraumatic.  Cardiovascular:     Rate and Rhythm: Normal rate and regular rhythm.     Pulses: Normal pulses.     Heart sounds: No murmur heard. Pulmonary:     Effort: Pulmonary effort is normal. No respiratory distress.     Breath sounds: No wheezing or rhonchi.  Musculoskeletal:     Cervical back: Normal range of motion.     Right lower leg: No edema.     Left lower leg: No edema.  Lymphadenopathy:     Cervical: No cervical adenopathy.  Skin:    General: Skin is warm and dry.     Capillary Refill: Capillary refill takes less than 2 seconds.     Findings: No rash.  Neurological:     General: No focal deficit present.     Mental Status: She is alert and oriented to person, place, and time.  Psychiatric:        Mood and Affect: Mood normal.        Behavior: Behavior normal.    Wt Readings from Last 3 Encounters:  04/17/21 220 lb (99.8 kg)  01/27/21 218 lb 14.7 oz (99.3 kg)  01/22/21 221 lb 1.9 oz (100.3 kg)    BP 134/66 (BP Location: Right Arm, Patient Position: Sitting, Cuff Size: Large)   Pulse 61   Ht 4\' 11"  (1.499 m)   Wt 220 lb (99.8 kg)   SpO2 97%   BMI 44.43 kg/m   Assessment and Plan: 1. Essential hypertension Clinically stable exam with well controlled BP. Tolerating medications without side effects at this time. Pt to continue current regimen and low sodium diet; benefits of regular exercise as able discussed.  2. Primary insomnia Doing well on Ambien Also taking clonazepam in the evening  3. Restless  leg syndrome On gabapentin 600  mg previously - recently decreased to 300 mg and doing well  4. Class 3 severe obesity without serious comorbidity with body mass index (BMI) of 40.0 to 44.9 in adult, unspecified obesity type (HCC) Seeing weight loss specialist in [place] who suggested that several of her medications where "setting her up for failure". I recommend continuing lower dose gabapentin. Stop metoprolol and monitor BP.  Then stop or lower the dose clonazepam. Continue Ambien   Partially dictated using Animal nutritionist. Any errors are unintentional.  Bari Edward, MD [facility]  04/17/2021

## 2021-04-17 NOTE — Patient Instructions (Signed)
Try stopping Metoprolol  Continue other medications then try stopping or cutting back on clonazepam

## 2021-04-25 ENCOUNTER — Encounter: Payer: Self-pay | Admitting: Internal Medicine

## 2021-04-25 ENCOUNTER — Other Ambulatory Visit: Payer: Self-pay

## 2021-04-25 DIAGNOSIS — G2581 Restless legs syndrome: Secondary | ICD-10-CM

## 2021-04-25 MED ORDER — AMLODIPINE BESYLATE 5 MG PO TABS: 5.0000 mg | ORAL_TABLET | ORAL | 1 refills | Status: DC

## 2021-04-25 MED ORDER — OMEPRAZOLE 20 MG PO CPDR: 20.0000 mg | DELAYED_RELEASE_CAPSULE | Freq: Two times a day (BID) | ORAL | 1 refills | Status: DC

## 2021-04-25 MED ORDER — GABAPENTIN 300 MG PO CAPS: 300.0000 mg | ORAL_CAPSULE | Freq: Every day | ORAL | 1 refills | Status: DC

## 2021-04-28 ENCOUNTER — Encounter: Payer: Self-pay | Admitting: Internal Medicine

## 2021-04-28 ENCOUNTER — Other Ambulatory Visit: Payer: Self-pay | Admitting: Internal Medicine

## 2021-04-28 DIAGNOSIS — F5101 Primary insomnia: Secondary | ICD-10-CM

## 2021-04-28 NOTE — Telephone Encounter (Signed)
Requested medication (s) are due for refill today:  yes  Requested medication (s) are on the active medication list: yes  Last refill:  03/30/2021  Future visit scheduled: yes  Notes to clinic:  This refill cannot be delegated   Requested Prescriptions  Pending Prescriptions Disp Refills   zolpidem (AMBIEN) 5 MG tablet [Pharmacy Med Name: Zolpidem Tartrate 5 MG Oral Tablet] 30 tablet 0    Sig: TAKE 1 TABLET BY MOUTH AT BEDTIME      Not Delegated - Psychiatry:  Anxiolytics/Hypnotics Failed - 04/28/2021 10:18 AM      Failed - This refill cannot be delegated      Failed - Urine Drug Screen completed in last 360 days      Passed - Valid encounter within last 6 months    Recent Outpatient Visits           1 week ago Essential hypertension   [facility] Reubin Milan, MD   4 months ago Essential hypertension   [facility] Reubin Milan, MD   7 months ago Restless leg syndrome   [facility] Reubin Milan, MD   8 months ago Essential hypertension   [facility] Reubin Milan, MD       Future Appointments             In 5 months Judithann Graves Nyoka Cowden, MD [facility]

## 2021-05-17 ENCOUNTER — Encounter: Payer: Self-pay | Admitting: Internal Medicine

## 2021-05-19 ENCOUNTER — Other Ambulatory Visit: Payer: Self-pay

## 2021-05-19 MED ORDER — HYDROCHLOROTHIAZIDE 25 MG PO TABS: 25.0000 mg | ORAL_TABLET | ORAL | 1 refills | Status: DC

## 2021-05-28 ENCOUNTER — Other Ambulatory Visit: Payer: Self-pay | Admitting: Internal Medicine

## 2021-05-28 ENCOUNTER — Encounter: Payer: Self-pay | Admitting: Internal Medicine

## 2021-05-28 DIAGNOSIS — F5101 Primary insomnia: Secondary | ICD-10-CM

## 2021-05-28 MED ORDER — ZOLPIDEM TARTRATE 5 MG PO TABS: 5.0000 mg | ORAL_TABLET | Freq: Every day | ORAL | 1 refills | Status: DC

## 2021-05-28 NOTE — Telephone Encounter (Signed)
Please review. Last office visit 04/17/2021.  KP

## 2021-06-11 ENCOUNTER — Encounter: Payer: Self-pay | Admitting: Internal Medicine

## 2021-06-11 ENCOUNTER — Other Ambulatory Visit: Payer: Self-pay | Admitting: Internal Medicine

## 2021-06-11 DIAGNOSIS — I10 Essential (primary) hypertension: Secondary | ICD-10-CM

## 2021-06-11 MED ORDER — FOSINOPRIL SODIUM 40 MG PO TABS: 40.0000 mg | ORAL_TABLET | Freq: Every day | ORAL | 0 refills | Status: DC

## 2021-06-11 NOTE — Telephone Encounter (Signed)
Please review.  KP

## 2021-06-12 ENCOUNTER — Other Ambulatory Visit: Payer: Self-pay | Admitting: Neurosurgery

## 2021-06-12 ENCOUNTER — Other Ambulatory Visit (HOSPITAL_COMMUNITY): Payer: Self-pay | Admitting: Neurosurgery

## 2021-06-12 DIAGNOSIS — M5440 Lumbago with sciatica, unspecified side: Secondary | ICD-10-CM | POA: Insufficient documentation

## 2021-06-12 DIAGNOSIS — Z6841 Body Mass Index (BMI) 40.0 and over, adult: Secondary | ICD-10-CM | POA: Insufficient documentation

## 2021-06-12 HISTORY — DX: Lumbago with sciatica, unspecified side: M54.40

## 2021-06-18 ENCOUNTER — Ambulatory Visit
Admission: RE | Admit: 2021-06-18 | Discharge: 2021-06-18 | Disposition: A | Discharge status: Home / Self Care | Payer: Medicare Other | Source: Ambulatory Visit | Attending: Neurosurgery | Admitting: Neurosurgery

## 2021-06-18 ENCOUNTER — Other Ambulatory Visit: Payer: Self-pay

## 2021-06-18 DIAGNOSIS — M5440 Lumbago with sciatica, unspecified side: Secondary | ICD-10-CM | POA: Insufficient documentation

## 2021-06-18 MED ORDER — GADOBUTROL 1 MMOL/ML IV SOLN
10.0000 mL | Freq: Once | INTRAVENOUS | Status: AC | PRN
  Administered 2021-06-18: 10 mL via INTRAVENOUS

## 2021-06-26 ENCOUNTER — Encounter: Payer: Self-pay | Admitting: Internal Medicine

## 2021-06-26 ENCOUNTER — Ambulatory Visit: Payer: Self-pay | Admitting: *Deleted

## 2021-06-26 ENCOUNTER — Other Ambulatory Visit: Payer: Self-pay | Admitting: Internal Medicine

## 2021-06-26 DIAGNOSIS — Z792 Long term (current) use of antibiotics: Secondary | ICD-10-CM

## 2021-06-26 MED ORDER — CEPHALEXIN 500 MG PO CAPS: 500.0000 mg | ORAL_CAPSULE | ORAL | 0 refills | Status: DC | PRN
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
## 2021-06-26 NOTE — Telephone Encounter (Signed)
Noted  KP 

## 2021-06-26 NOTE — Telephone Encounter (Signed)
Please review.  KP
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
## 2021-06-30 ENCOUNTER — Other Ambulatory Visit: Payer: Self-pay | Admitting: Otolaryngology

## 2021-06-30 ENCOUNTER — Telehealth: Payer: Self-pay | Admitting: Internal Medicine

## 2021-06-30 ENCOUNTER — Other Ambulatory Visit: Payer: Self-pay | Admitting: Internal Medicine

## 2021-06-30 DIAGNOSIS — R42 Dizziness and giddiness: Secondary | ICD-10-CM

## 2021-06-30 DIAGNOSIS — H918X9 Other specified hearing loss, unspecified ear: Secondary | ICD-10-CM

## 2021-06-30 NOTE — Telephone Encounter (Signed)
Patient Katrina Poole has an appt tomorrow on 07/01/2021 and stated that Dr. Nile Riggs rm 210 in this building wants her to tell Dr.Bergland to give her a full blood work up showing vitaman k etc.

## 2021-07-01 ENCOUNTER — Encounter: Payer: Self-pay | Admitting: Internal Medicine

## 2021-07-01 ENCOUNTER — Other Ambulatory Visit: Payer: Self-pay

## 2021-07-01 ENCOUNTER — Ambulatory Visit (INDEPENDENT_AMBULATORY_CARE_PROVIDER_SITE_OTHER): Payer: Medicare Other | Admitting: Internal Medicine

## 2021-07-01 VITALS — BP 138/82 | HR 81 | Ht 59.0 in | Wt 218.0 lb

## 2021-07-01 DIAGNOSIS — H539 Unspecified visual disturbance: Secondary | ICD-10-CM

## 2021-07-01 DIAGNOSIS — F411 Generalized anxiety disorder: Secondary | ICD-10-CM

## 2021-07-01 DIAGNOSIS — R002 Palpitations: Secondary | ICD-10-CM

## 2021-07-01 DIAGNOSIS — I10 Essential (primary) hypertension: Secondary | ICD-10-CM

## 2021-07-01 DIAGNOSIS — K219 Gastro-esophageal reflux disease without esophagitis: Secondary | ICD-10-CM | POA: Diagnosis not present

## 2021-07-01 DIAGNOSIS — E782 Mixed hyperlipidemia: Secondary | ICD-10-CM | POA: Insufficient documentation

## 2021-07-01 MED ORDER — CLONAZEPAM 0.5 MG PO TABS: 0.5000 mg | ORAL_TABLET | Freq: Every day | ORAL | 0 refills | Status: DC

## 2021-07-01 NOTE — Telephone Encounter (Signed)
Pt said there is misunderstanding. Dr. Elenore Rota didn't request Blood Work. He just told her to see Dr. Judithann Graves as the visit and discuss any concerns, and if needed have blood work to make sure nothing is out of range.  I told her We do not do labs for other providers, and if Dr Elenore Rota wanted labs for his office and diagnosis' then he will need to order them and send her to the lab.   She said no labs are needed, he said to discuss everything with Dr. Judithann Graves.
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
## 2021-07-02 LAB — COMPREHENSIVE METABOLIC PANEL
ALT: 19 IU/L (ref 0–32)
AST: 18 IU/L (ref 0–40)
Albumin/Globulin Ratio: 2 (ref 1.2–2.2)
Albumin: 4.6 g/dL (ref 3.7–4.7)
Alkaline Phosphatase: 119 IU/L (ref 44–121)
BUN/Creatinine Ratio: 21 (ref 12–28)
BUN: 22 mg/dL (ref 8–27)
Bilirubin Total: 0.3 mg/dL (ref 0.0–1.2)
CO2: 24 mmol/L (ref 20–29)
Calcium: 9.5 mg/dL (ref 8.7–10.3)
Chloride: 96 mmol/L (ref 96–106)
Creatinine, Ser: 1.03 mg/dL — ABNORMAL HIGH (ref 0.57–1.00)
Globulin, Total: 2.3 g/dL (ref 1.5–4.5)
Glucose: 99 mg/dL (ref 70–99)
Potassium: 4 mmol/L (ref 3.5–5.2)
Sodium: 136 mmol/L (ref 134–144)
Total Protein: 6.9 g/dL (ref 6.0–8.5)
eGFR: 57 mL/min/{1.73_m2} — ABNORMAL LOW (ref >59)

## 2021-07-02 LAB — CBC WITH DIFFERENTIAL/PLATELET
Basophils Absolute: 0.1 10*3/uL (ref 0.0–0.2)
Basos: 1 %
EOS (ABSOLUTE): 0.2 10*3/uL (ref 0.0–0.4)
Eos: 2 %
Hematocrit: 33.2 % — ABNORMAL LOW (ref 34.0–46.6)
Hemoglobin: 11.5 g/dL (ref 11.1–15.9)
Immature Grans (Abs): 0 10*3/uL (ref 0.0–0.1)
Immature Granulocytes: 0 %
Lymphocytes Absolute: 1.6 10*3/uL (ref 0.7–3.1)
Lymphs: 17 %
MCH: 29.9 pg (ref 26.6–33.0)
MCHC: 34.6 g/dL (ref 31.5–35.7)
MCV: 86 fL (ref 79–97)
Monocytes Absolute: 0.7 10*3/uL (ref 0.1–0.9)
Monocytes: 7 %
Neutrophils Absolute: 7.1 10*3/uL — ABNORMAL HIGH (ref 1.4–7.0)
Neutrophils: 73 %
Platelets: 334 10*3/uL (ref 150–450)
RBC: 3.85 x10E6/uL (ref 3.77–5.28)
RDW: 14.6 % (ref 11.7–15.4)
WBC: 9.8 10*3/uL (ref 3.4–10.8)

## 2021-07-02 LAB — LIPID PANEL
Chol/HDL Ratio: 3.3 ratio (ref 0.0–4.4)
Cholesterol, Total: 224 mg/dL — ABNORMAL HIGH (ref 100–199)
HDL: 68 mg/dL (ref >39)
LDL Chol Calc (NIH): 125 mg/dL — ABNORMAL HIGH (ref 0–99)
Triglycerides: 179 mg/dL — ABNORMAL HIGH (ref 0–149)
VLDL Cholesterol Cal: 31 mg/dL (ref 5–40)

## 2021-07-02 LAB — VITAMIN B12: Vitamin B-12: 522 pg/mL (ref 232–1245)

## 2021-07-02 LAB — TSH+FREE T4
Free T4: 1.14 ng/dL (ref 0.82–1.77)
TSH: 1.61 u[IU]/mL (ref 0.450–4.500)

## 2021-07-02 LAB — ANA W/REFLEX IF POSITIVE: Anti Nuclear Antibody (ANA): NEGATIVE

## 2021-07-02 LAB — SEDIMENTATION RATE: Sed Rate: 18 mm/hr (ref 0–40)

## 2021-07-09 ENCOUNTER — Ambulatory Visit
Admission: RE | Admit: 2021-07-09 | Discharge: 2021-07-09 | Disposition: A | Discharge status: Home / Self Care | Payer: Medicare Other | Source: Ambulatory Visit | Attending: Otolaryngology | Admitting: Otolaryngology

## 2021-07-09 ENCOUNTER — Other Ambulatory Visit: Payer: Self-pay

## 2021-07-09 DIAGNOSIS — H918X9 Other specified hearing loss, unspecified ear: Secondary | ICD-10-CM | POA: Diagnosis present

## 2021-07-09 DIAGNOSIS — R42 Dizziness and giddiness: Secondary | ICD-10-CM | POA: Diagnosis present

## 2021-07-09 MED ORDER — GADOBUTROL 1 MMOL/ML IV SOLN
10.0000 mL | Freq: Once | INTRAVENOUS | Status: AC | PRN
  Administered 2021-07-09: 10 mL via INTRAVENOUS

## 2021-07-21 DIAGNOSIS — M5416 Radiculopathy, lumbar region: Secondary | ICD-10-CM | POA: Insufficient documentation

## 2021-07-21 DIAGNOSIS — M48061 Spinal stenosis, lumbar region without neurogenic claudication: Secondary | ICD-10-CM | POA: Diagnosis present

## 2021-07-23 ENCOUNTER — Other Ambulatory Visit: Payer: Self-pay

## 2021-07-23 ENCOUNTER — Encounter: Payer: Self-pay | Admitting: Internal Medicine

## 2021-07-23 MED ORDER — MELOXICAM 7.5 MG PO TABS: 7.5000 mg | ORAL_TABLET | Freq: Two times a day (BID) | ORAL | 0 refills | Status: DC

## 2021-07-26 ENCOUNTER — Other Ambulatory Visit: Payer: Self-pay | Admitting: Internal Medicine

## 2021-07-26 DIAGNOSIS — F5101 Primary insomnia: Secondary | ICD-10-CM

## 2021-07-27 NOTE — Telephone Encounter (Signed)
Requested medication (s) are due for refill today: yes  Requested medication (s) are on the active medication list: yes  Last refill:  05/28/21 #30 1 RF   Future visit scheduled: yes  Notes to clinic:  med not delegated to NT to RF   Requested Prescriptions  Pending Prescriptions Disp Refills   zolpidem (AMBIEN) 5 MG tablet [Pharmacy Med Name: Zolpidem Tartrate 5 MG Oral Tablet] 30 tablet 0    Sig: TAKE 1 TABLET BY MOUTH AT BEDTIME     Not Delegated - Psychiatry:  Anxiolytics/Hypnotics Failed - 07/26/2021 11:58 AM      Failed - This refill cannot be delegated      Failed - Urine Drug Screen completed in last 360 days      Passed - Valid encounter within last 6 months    Recent Outpatient Visits           3 weeks ago Essential hypertension   [facility] Reubin Milan, MD   3 months ago Essential hypertension   [facility] Reubin Milan, MD   7 months ago Essential hypertension   [facility] Reubin Milan, MD   10 months ago Restless leg syndrome   [facility] Reubin Milan, MD   11 months ago Essential hypertension   [facility] Reubin Milan, MD       Future Appointments             In 1 week Agbor-Etang, Arlys John, MD [facility]   In 2 months Judithann Graves, Nyoka Cowden, MD [facility], [facility]

## 2021-07-28 ENCOUNTER — Other Ambulatory Visit: Payer: Self-pay | Admitting: Internal Medicine

## 2021-07-28 ENCOUNTER — Encounter: Payer: Self-pay | Admitting: Internal Medicine

## 2021-08-04 ENCOUNTER — Other Ambulatory Visit: Payer: Self-pay

## 2021-08-04 ENCOUNTER — Encounter: Payer: Self-pay | Admitting: Cardiology

## 2021-08-04 ENCOUNTER — Ambulatory Visit (INDEPENDENT_AMBULATORY_CARE_PROVIDER_SITE_OTHER): Payer: Medicare Other | Admitting: Cardiology

## 2021-08-04 ENCOUNTER — Ambulatory Visit (INDEPENDENT_AMBULATORY_CARE_PROVIDER_SITE_OTHER): Payer: Medicare Other

## 2021-08-04 VITALS — BP 150/78 | HR 74 | Ht 60.0 in | Wt 214.4 lb

## 2021-08-04 DIAGNOSIS — I10 Essential (primary) hypertension: Secondary | ICD-10-CM

## 2021-08-04 DIAGNOSIS — H538 Other visual disturbances: Secondary | ICD-10-CM | POA: Diagnosis not present

## 2021-08-04 NOTE — Patient Instructions (Signed)
Medication Instructions:   Your physician recommends that you continue on your current medications as directed. Please refer to the Current Medication list given to you today.  *If you need a refill on your cardiac medications before your next appointment, please call your pharmacy*   Lab Work:  None Ordered  If you have labs (blood work) drawn today and your tests are completely normal, you will receive your results only by: MyChart Message (if you have MyChart) OR A paper copy in the mail If you have any lab test that is abnormal or we need to change your treatment, we will call you to review the results.   Testing/Procedures:  Your physician has requested that you have a carotid duplex. This test is an ultrasound of the carotid arteries in your neck. It looks at blood flow through these arteries that supply the brain with blood. Allow one hour for this exam. There are no restrictions or special instructions.   2. Your physician has recommended that you wear a Zio monitor.   This monitor is a medical device that records the heart's electrical activity. Doctors most often use these monitors to diagnose arrhythmias. Arrhythmias are problems with the speed or rhythm of the heartbeat. The monitor is a small device applied to your chest. You can wear one while you do your normal daily activities. While wearing this monitor if you have any symptoms to push the button and record what you felt. Once you have worn this monitor for the period of time provider prescribed (Usually 14 days), you will return the monitor device in the postage paid box. Once it is returned they will download the data collected and provide [place] with a report which the provider will then review and we will call you with those results. Important tips:  Avoid showering during the first 24 hours of wearing the monitor. Avoid excessive sweating to help maximize wear time. Do not submerge the device, no hot tubs, and no swimming  pools. Keep any lotions or oils away from the patch. After 24 hours you may shower with the patch on. Take brief showers with your back facing the shower head.  Do not remove patch once it has been placed because that will interrupt data and decrease adhesive wear time. Push the button when you have any symptoms and write down what you were feeling. Once you have completed wearing your monitor, remove and place into box which has postage paid and place in your outgoing mailbox.  If for some reason you have misplaced your box then call our office and we can provide another box and/or mail it off for you.   Follow-Up: At [facility], you and your health needs are our priority.  As part of our continuing mission to provide you with exceptional heart care, we have created designated Provider Care Teams.  These Care Teams include your primary Cardiologist (physician) and Advanced Practice Providers (APPs -  Physician Assistants and Nurse Practitioners) who all work together to provide you with the care you need, when you need it.  We recommend signing up for the patient portal called "MyChart".  Sign up information is provided on this After Visit Summary.  MyChart is used to connect with patients for Virtual Visits (Telemedicine).  Patients are able to view lab/test results, encounter notes, upcoming appointments, etc.  Non-urgent messages can be sent to your provider as well.   To learn more about what you can do with MyChart, go to [email or web address].  Your next appointment:   As Needed
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
## 2021-08-12 ENCOUNTER — Encounter (HOSPITAL_COMMUNITY): Payer: Self-pay | Admitting: Radiology

## 2021-08-12 DIAGNOSIS — H538 Other visual disturbances: Secondary | ICD-10-CM | POA: Diagnosis not present

## 2021-08-18 ENCOUNTER — Ambulatory Visit (HOSPITAL_COMMUNITY)
Admission: RE | Admit: 2021-08-18 | Discharge: 2021-08-18 | Disposition: A | Discharge status: Home / Self Care | Payer: Medicare Other | Source: Ambulatory Visit | Attending: Cardiology | Admitting: Cardiology

## 2021-08-18 ENCOUNTER — Other Ambulatory Visit: Payer: Self-pay

## 2021-08-18 DIAGNOSIS — H538 Other visual disturbances: Secondary | ICD-10-CM | POA: Insufficient documentation

## 2021-09-17 ENCOUNTER — Other Ambulatory Visit: Payer: Self-pay

## 2021-09-17 ENCOUNTER — Encounter: Payer: Self-pay | Admitting: Internal Medicine

## 2021-09-17 DIAGNOSIS — I10 Essential (primary) hypertension: Secondary | ICD-10-CM

## 2021-09-17 MED ORDER — FOSINOPRIL SODIUM 40 MG PO TABS: 40.0000 mg | ORAL_TABLET | Freq: Every day | ORAL | 0 refills | Status: DC

## 2021-09-23 ENCOUNTER — Other Ambulatory Visit: Payer: Self-pay | Admitting: Neurosurgery

## 2021-09-23 DIAGNOSIS — M5416 Radiculopathy, lumbar region: Secondary | ICD-10-CM

## 2021-10-01 ENCOUNTER — Ambulatory Visit
Admission: RE | Admit: 2021-10-01 | Discharge: 2021-10-01 | Disposition: A | Discharge status: Home / Self Care | Payer: Medicare Other | Source: Ambulatory Visit | Attending: Neurosurgery | Admitting: Neurosurgery

## 2021-10-01 ENCOUNTER — Other Ambulatory Visit: Payer: Self-pay

## 2021-10-01 DIAGNOSIS — M5416 Radiculopathy, lumbar region: Secondary | ICD-10-CM | POA: Diagnosis not present

## 2021-10-02 ENCOUNTER — Other Ambulatory Visit: Payer: Medicare Other

## 2021-10-13 ENCOUNTER — Other Ambulatory Visit: Payer: Self-pay | Admitting: Internal Medicine

## 2021-10-13 ENCOUNTER — Ambulatory Visit (INDEPENDENT_AMBULATORY_CARE_PROVIDER_SITE_OTHER): Payer: Medicare Other

## 2021-10-13 DIAGNOSIS — Z1231 Encounter for screening mammogram for malignant neoplasm of breast: Secondary | ICD-10-CM

## 2021-10-13 DIAGNOSIS — Z0000 Encounter for general adult medical examination without abnormal findings: Secondary | ICD-10-CM | POA: Diagnosis not present

## 2021-10-13 DIAGNOSIS — F411 Generalized anxiety disorder: Secondary | ICD-10-CM

## 2021-10-13 NOTE — Telephone Encounter (Signed)
Please review. Last office visit 07/01/2021.  KP

## 2021-10-13 NOTE — Patient Instructions (Addendum)
Katrina Poole , Thank you for taking time to come for your Medicare Wellness Visit. I appreciate your ongoing commitment to your health goals. Please review the following plan we discussed and let me know if I can assist you in the future.   Screening recommendations/referrals: Colonoscopy: done 06/04/16. Repeat 05/2026 Mammogram: done 11/01/20. Please call [phone] to schedule your mammogram.  Bone Density: done 05/03/19 Recommended yearly ophthalmology/optometry visit for glaucoma screening and checkup Recommended yearly dental visit for hygiene and checkup  Vaccinations: Influenza vaccine: declined Pneumococcal vaccine: done 01/22/15 Tdap vaccine: done 06/10/19 Shingles vaccine: Shingrix discussed. Please contact your pharmacy for coverage information.  Covid-19: done 11/19/19, 12/18/19 & 08/21/20  Advanced directives: Please bring a copy of your health care power of attorney and living will to the office at your convenience.   Conditions/risks identified: Recommend increasing physical activity as tolerated  Next appointment: Follow up in one year for your annual wellness visit    Preventive Care 65 Years and Older, Female Preventive care refers to lifestyle choices and visits with your health care provider that can promote health and wellness. What does preventive care include? A yearly physical exam. This is also called an annual well check. Dental exams once or twice a year. Routine eye exams. Ask your health care provider how often you should have your eyes checked. Personal lifestyle choices, including: Daily care of your teeth and gums. Regular physical activity. Eating a healthy diet. Avoiding tobacco and drug use. Limiting alcohol use. Practicing safe sex. Taking low-dose aspirin every day. Taking vitamin and mineral supplements as recommended by your health care provider. What happens during an annual well check? The services and screenings done by your health care  provider during your annual well check will depend on your age, overall health, lifestyle risk factors, and family history of disease. Counseling  Your health care provider may ask you questions about your: Alcohol use. Tobacco use. Drug use. Emotional well-being. Home and relationship well-being. Sexual activity. Eating habits. History of falls. Memory and ability to understand (cognition). Work and work Astronomer. Reproductive health. Screening  You may have the following tests or measurements: Height, weight, and BMI. Blood pressure. Lipid and cholesterol levels. These may be checked every 5 years, or more frequently if you are over 74 years old. Skin check. Lung cancer screening. You may have this screening every year starting at age 74 if you have a 30-pack-year history of smoking and currently smoke or have quit within the past 15 years. Fecal occult blood test (FOBT) of the stool. You may have this test every year starting at age 74. Flexible sigmoidoscopy or colonoscopy. You may have a sigmoidoscopy every 5 years or a colonoscopy every 10 years starting at age 74. Hepatitis C blood test. Hepatitis B blood test. Sexually transmitted disease (STD) testing. Diabetes screening. This is done by checking your blood sugar (glucose) after you have not eaten for a while (fasting). You may have this done every 1-3 years. Bone density scan. This is done to screen for osteoporosis. You may have this done starting at age 74. Mammogram. This may be done every 1-2 years. Talk to your health care provider about how often you should have regular mammograms. Talk with your health care provider about your test results, treatment options, and if necessary, the need for more tests. Vaccines  Your health care provider may recommend certain vaccines, such as: Influenza vaccine. This is recommended every year. Tetanus, diphtheria, and acellular pertussis (Tdap, Td) vaccine. You  may need a Td  booster every 10 years. Zoster vaccine. You may need this after age 74. Pneumococcal 13-valent conjugate (PCV13) vaccine. One dose is recommended after age 74. Pneumococcal polysaccharide (PPSV23) vaccine. One dose is recommended after age 74. Talk to your health care provider about which screenings and vaccines you need and how often you need them. This information is not intended to replace advice given to you by your health care provider. Make sure you discuss any questions you have with your health care provider. Document Released: 10/18/2015 Document Revised: 06/10/2016 Document Reviewed: 07/23/2015 Elsevier Interactive Patient Education  2017 [place] Prevention in the Home Falls can cause injuries. They can happen to people of all ages. There are many things you can do to make your home safe and to help prevent falls. What can I do on the outside of my home? Regularly fix the edges of walkways and driveways and fix any cracks. Remove anything that might make you trip as you walk through a door, such as a raised step or threshold. Trim any bushes or trees on the path to your home. Use bright outdoor lighting. Clear any walking paths of anything that might make someone trip, such as rocks or tools. Regularly check to see if handrails are loose or broken. Make sure that both sides of any steps have handrails. Any raised decks and porches should have guardrails on the edges. Have any leaves, snow, or ice cleared regularly. Use sand or salt on walking paths during winter. Clean up any spills in your garage right away. This includes oil or grease spills. What can I do in the bathroom? Use night lights. Install grab bars by the toilet and in the tub and shower. Do not use towel bars as grab bars. Use non-skid mats or decals in the tub or shower. If you need to sit down in the shower, use a plastic, non-slip stool. Keep the floor dry. Clean up any water that spills on the floor  as soon as it happens. Remove soap buildup in the tub or shower regularly. Attach bath mats securely with double-sided non-slip rug tape. Do not have throw rugs and other things on the floor that can make you trip. What can I do in the bedroom? Use night lights. Make sure that you have a light by your bed that is easy to reach. Do not use any sheets or blankets that are too big for your bed. They should not hang down onto the floor. Have a firm chair that has side arms. You can use this for support while you get dressed. Do not have throw rugs and other things on the floor that can make you trip. What can I do in the kitchen? Clean up any spills right away. Avoid walking on wet floors. Keep items that you use a lot in easy-to-reach places. If you need to reach something above you, use a strong step stool that has a grab bar. Keep electrical cords out of the way. Do not use floor polish or wax that makes floors slippery. If you must use wax, use non-skid floor wax. Do not have throw rugs and other things on the floor that can make you trip. What can I do with my stairs? Do not leave any items on the stairs. Make sure that there are handrails on both sides of the stairs and use them. Fix handrails that are broken or loose. Make sure that handrails are as long as the  stairways. Check any carpeting to make sure that it is firmly attached to the stairs. Fix any carpet that is loose or worn. Avoid having throw rugs at the top or bottom of the stairs. If you do have throw rugs, attach them to the floor with carpet tape. Make sure that you have a light switch at the top of the stairs and the bottom of the stairs. If you do not have them, ask someone to add them for you. What else can I do to help prevent falls? Wear shoes that: Do not have high heels. Have rubber bottoms. Are comfortable and fit you well. Are closed at the toe. Do not wear sandals. If you use a stepladder: Make sure that it is  fully opened. Do not climb a closed stepladder. Make sure that both sides of the stepladder are locked into place. Ask someone to hold it for you, if possible. Clearly mark and make sure that you can see: Any grab bars or handrails. First and last steps. Where the edge of each step is. Use tools that help you move around (mobility aids) if they are needed. These include: Canes. Walkers. Scooters. Crutches. Turn on the lights when you go into a dark area. Replace any light bulbs as soon as they burn out. Set up your furniture so you have a clear path. Avoid moving your furniture around. If any of your floors are uneven, fix them. If there are any pets around you, be aware of where they are. Review your medicines with your doctor. Some medicines can make you feel dizzy. This can increase your chance of falling. Ask your doctor what other things that you can do to help prevent falls. This information is not intended to replace advice given to you by your health care provider. Make sure you discuss any questions you have with your health care provider. Document Released: 07/18/2009 Document Revised: 02/27/2016 Document Reviewed: 10/26/2014 Elsevier Interactive Patient Education  2017 [employer].

## 2021-10-13 NOTE — Progress Notes (Signed)
Subjective:   Katrina Poole is a 76 y.o. female who presents for Medicare Annual (Subsequent) preventive examination.  Virtual Visit via Telephone Note  I connected with  Katrina Poole on 10/13/21 at  8:00 AM EST by telephone and verified that I am speaking with the correct person using two identifiers.  Location: Patient: home [facility]: [facility] Persons participating in the virtual visit: patient/Nurse Health Advisor   I discussed the limitations, risks, security and privacy concerns of performing an evaluation and management service by telephone and the availability of in person appointments. The patient expressed understanding and agreed to proceed.  Interactive audio and video telecommunications were attempted between this nurse and patient, however failed, due to patient having technical difficulties OR patient did not have access to video capability.  We continued and completed visit with audio only.  Some vital signs may be absent or patient reported.   Reather LittlerKasey Chadley Dziedzic, LPN   Review of Systems     Cardiac Risk Factors include: advanced age (>[id number], 34>65 women);hypertension;obesity (BMI >30kg/m2)     Objective:    Today's Vitals   10/13/21 0827  PainSc: 5    There is no height or weight on file to calculate BMI.  Advanced Directives 10/13/2021 01/27/2021 01/27/2021 01/22/2021 10/07/2020 04/29/2020 04/22/2020  Does Patient Have a Medical Advance Directive? Yes Yes Yes Yes Yes Yes Yes  Type of Estate agentAdvance Directive Healthcare Power of RodmanAttorney;Living will Healthcare Power of PostAttorney;Living will Healthcare Power of [place]orney;Living will - Healthcare Power of [place]orney;Living will Healthcare Power of [place]orney;Living will Healthcare Power of [place]orney;Living will  Does patient want to make changes to medical advance directive? - No - Patient declined No - Patient declined - - No - Patient declined No - Patient declined  Copy of Healthcare Power of Attorney in Chart? No - copy requested No -  copy requested No - copy requested - No - copy requested No - copy requested No - copy requested  Would patient like information on creating a medical advance directive? - - - - - - -    Current Medications (verified) Outpatient Encounter Medications as of 10/13/2021  Medication Sig   acetaminophen (TYLENOL) 500 MG tablet Take 2 tablets (1,000 mg total) by mouth every 6 (six) hours as needed (DO NOT EXCEED 4000 MG DAILY FROM ALL SOURCES).   albuterol (VENTOLIN HFA) 108 (90 Base) MCG/ACT inhaler Inhale 2 puffs into the lungs every 6 (six) hours as needed for wheezing or shortness of breath.   alum hydroxide-mag trisilicate (GAVISCON) 80-20 MG CHEW chewable tablet Chew 1 tablet by mouth daily as needed for indigestion or heartburn.    amLODipine (NORVASC) 5 MG tablet Take 1 tablet (5 mg total) by mouth every morning.   cephALEXin (KEFLEX) 500 MG capsule Take 1 capsule (500 mg total) by mouth as needed. TAKE 4 PRIOR TO DENTAL PROCEDURES   cholecalciferol (VITAMIN D3) 25 MCG (1000 UNIT) tablet Take 1,000 Units by mouth at bedtime.   clonazePAM (KLONOPIN) 0.5 MG tablet Take 1 tablet (0.5 mg total) by mouth at bedtime.   fosinopril (MONOPRIL) 40 MG tablet Take 1 tablet (40 mg total) by mouth daily.   gabapentin (NEURONTIN) 300 MG capsule Take 1-2 capsules (300-600 mg total) by mouth at bedtime.   hydrochlorothiazide (HYDRODIURIL) 25 MG tablet Take 1 tablet (25 mg total) by mouth every morning.   meclizine (ANTIVERT) 12.5 MG tablet Take 12.5 mg by mouth 3 (three) times daily as needed for dizziness. Dr. Eduard ClosShoh  meloxicam (MOBIC) 7.5 MG tablet Take 1 tablet (7.5 mg total) by mouth 2 (two) times daily.   Multiple Vitamin (MULTIVITAMIN) tablet Take 1 tablet by mouth every morning.    omeprazole (PRILOSEC) 20 MG capsule Take 1 capsule (20 mg total) by mouth 2 (two) times daily before a meal.   traMADol (ULTRAM) 50 MG tablet Take 50 mg by mouth every 6 (six) hours as needed.   zolpidem (AMBIEN) 5 MG tablet  TAKE 1 TABLET BY MOUTH AT BEDTIME   No facility-administered encounter medications on file as of 10/13/2021.    Allergies (verified) Sulfa antibiotics, Zithromax [azithromycin], Oxycodone, and Beef-derived products   History: Past Medical History:  Diagnosis Date   Arthritis    osteo of left hip   Asthma    humidity and exercise during summer   GERD (gastroesophageal reflux disease)    Hypertension    controlled on meds   Meniere syndrome    hearing deaf right ear   Neuromuscular disorder (HCC)    finger tips go numb, neck vertebrae   Restless leg syndrome    Vertigo    Wears dentures    full dentures on top, partial bottom   Past Surgical History:  Procedure Laterality Date   ANTERIOR CERVICAL DECOMP/DISCECTOMY FUSION N/A 03/16/2018   Procedure: Anterior Cervical Discectomy Fusion - Cervical Three- Cervical Four, Cervical Four- Cervical Five, Cervical Five-Cervical Six, Cervical Six- Cerival Seven;  Surgeon: Donalee Citrin, MD;  Location: [facility] OR;  Service: Neurosurgery;  Laterality: N/A;  Anterior Cervical Discectomy Fusion - Cervical Three- Cervical Four, Cervical Four- Cervical Five, Cervical Five-Cervical Six, Cervical Six- Ceriva   CARDIAC CATHETERIZATION     x2 both normal   CARPAL TUNNEL RELEASE     COLONOSCOPY     COLONOSCOPY WITH PROPOFOL N/A 06/04/2016   Procedure: COLONOSCOPY WITH PROPOFOL;  Surgeon: Christena Deem, MD;  Location: [facility] ENDOSCOPY;  Service: Endoscopy;  Laterality: N/A;   DILATION AND CURETTAGE OF UTERUS     ECTOPIC PREGNANCY SURGERY     ESOPHAGOGASTRODUODENOSCOPY (EGD) WITH PROPOFOL N/A 06/04/2016   Procedure: ESOPHAGOGASTRODUODENOSCOPY (EGD) WITH PROPOFOL;  Surgeon: Christena Deem, MD;  Location: [facility] ENDOSCOPY;  Service: Endoscopy;  Laterality: N/A;   exploratory laparoscopy     infertility surgery   HALLUX VALGUS CORRECTION Bilateral    JOINT REPLACEMENT Bilateral    partial knee   KNEE ARTHROPLASTY Left 01/27/2021   Procedure: CONVERSION OF  LEFT PARTIAL KNEE TO LEFT TOTAL KNEE.;  Surgeon: Donato Heinz, MD;  Location: [facility] ORS;  Service: Orthopedics;  Laterality: Left;   KNEE ARTHROSCOPY Left 07/19/2015   Procedure: Arthroscopic limited synovectomy along with lateral compartment and retropatellar chondral debridement plus removal of several small loose bodies;  Surgeon: Erin Sons, MD;  Location: [facility];  Service: Orthopedics;  Laterality: Left;  1ST CASE PER CECE   LUMBAR LAMINECTOMY/DECOMPRESSION MICRODISCECTOMY Right 04/29/2020   Procedure: Microdiscectomy - Lumbar Three-Lumbar Four - Lumbar Four-Lumbar Five - right;  Surgeon: Donalee Citrin, MD;  Location: [facility] OR;  Service: Neurosurgery;  Laterality: Right;  Microdiscectomy - Lumbar Three-Lumbar Four - Lumbar Four-Lumbar Five - right   MEDIAL PARTIAL KNEE REPLACEMENT Bilateral    SHOULDER SURGERY Right    TOTAL HIP ARTHROPLASTY Left 11/25/2015   Procedure: TOTAL HIP ARTHROPLASTY;  Surgeon: Donato Heinz, MD;  Location: [facility] ORS;  Service: Orthopedics;  Laterality: Left;   TRIGGER FINGER RELEASE Bilateral    Family History  Problem Relation Age of Onset   Heart failure  Mother    COPD Mother    Hypertension Mother    Cancer Father    Stroke Brother    Social History   Socioeconomic History   Marital status: Married    Spouse name: Not on file   Number of children: Not on file   Years of education: Not on file   Highest education level: Not on file  Occupational History   Not on file  Tobacco Use   Smoking status: Former    Packs/day: 1.50    Years: 30.00    Pack years: 45.00    Types: Cigarettes    Quit date: 11/12/1990    Years since quitting: 30.9   Smokeless tobacco: Never  Vaping Use   Vaping Use: Never used  Substance and Sexual Activity   Alcohol use: Yes    Alcohol/week: 1.0 standard drink    Types: 1 Standard drinks or equivalent per week    Comment: OCC [place]   Drug use: No   Sexual activity: Not Currently  Other Topics  Concern   Not on file  Social History Narrative   Not on file   Social Determinants of Health   Financial Resource Strain: Low Risk    Difficulty of Paying Living Expenses: Not hard at all  Food Insecurity: No Food Insecurity   Worried About Programme researcher, broadcasting/film/video in the Last Year: Never true   Ran Out of Food in the Last Year: Never true  Transportation Needs: No Transportation Needs   Lack of Transportation (Medical): No   Lack of Transportation (Non-Medical): No  Physical Activity: Inactive   Days of Exercise per Week: 0 days   Minutes of Exercise per Session: 0 min  Stress: No Stress Concern Present   Feeling of Stress : Not at all  Social Connections: Moderately Integrated   Frequency of Communication with Friends and Family: More than three times a week   Frequency of Social Gatherings with Friends and Family: Three times a week   Attends Religious Services: Never   Active Member of Clubs or Organizations: Yes   Attends Engineer, structural: More than 4 times per year   Marital Status: Married    Tobacco Counseling Counseling given: Not Answered   Clinical Intake:  Pre-visit preparation completed: Yes  Pain : 0-10 Pain Score: 5  Pain Type: Chronic pain Pain Location: Leg Pain Orientation: Right (sciatica) Pain Descriptors / Indicators: Aching, Sore, Constant Pain Onset: More than a month ago Pain Frequency: Constant     Nutritional Risks: None Diabetes: No  How often do you need to have someone help you when you read instructions, pamphlets, or other written materials from your doctor or pharmacy?: 1 - Never    Interpreter Needed?: No  Information entered by :: Reather Littler LPN   Activities of Daily Living In your present state of health, do you have any difficulty performing the following activities: 10/13/2021 07/01/2021  Hearing? Y Y  Comment does not require hearing aids -  Vision? N N  Difficulty concentrating or making decisions? N N   Walking or climbing stairs? N Y  Dressing or bathing? N N  Doing errands, shopping? N N  Preparing Food and eating ? N -  Using the Toilet? N -  In the past six months, have you accidently leaked urine? N -  Do you have problems with loss of bowel control? Y -  Comment IBS -  Managing your Medications? N -  Managing your Finances? N -  Housekeeping or managing your Housekeeping? N -  Some recent data might be hidden    Patient Care Team: Reubin MilanBerglund, Laura H, MD as PCP - General (Internal Medicine) Donalee Citrinram, Gary, MD as Consulting Physician (Neurosurgery) Pasty Archang, Julia W (Inactive) as Referring Physician (Gastroenterology) Lonell FaceShah, Hemang K, MD as Consulting Physician (Neurology) Ernest PineHooten, Illene LabradorJames P, MD (Orthopedic Surgery)  Indicate any recent Medical Services you may have received from other than [facility] providers in the past year (date may be approximate).     Assessment:   This is a routine wellness examination for Katrina Poole.  Hearing/Vision screen Hearing Screening - Comments:: Pt states she is deaf in right ear but hearing okay in left ear despite constant white noise; does not tolerate hearing aids  Vision Screening - Comments:: Annual vision screenings done by Dr. Clydene PughWoodard  Dietary issues and exercise activities discussed: Current Exercise Habits: The patient does not participate in regular exercise at present, Exercise limited by: orthopedic condition(s)   Goals Addressed             This Visit's Progress    Increase physical activity       Recommend increasing physical activity as tolereated       Depression Screen PHQ 2/9 Scores 10/13/2021 07/01/2021 04/17/2021 12/26/2020 10/07/2020 09/16/2020 08/16/2020  PHQ - 2 Score 0 0 0 0 0 0 0  PHQ- 9 Score - 1 1 0 - 0 0    Fall Risk Fall Risk  10/13/2021 07/01/2021 04/17/2021 12/26/2020 10/07/2020  Falls in the past year? 0 1 1 0 1  Number falls in past yr: 0 1 0 - 0  Injury with Fall? 0 0 1 - 1  Risk for fall due to : Orthopedic patient No  Fall Risks History of fall(s);Impaired balance/gait - History of fall(s);Impaired balance/gait  Follow up Falls prevention discussed Falls evaluation completed Falls evaluation completed Falls evaluation completed Falls prevention discussed    FALL RISK PREVENTION PERTAINING TO THE HOME:  Any stairs in or around the home? Yes  If so, are there any without handrails? No  Home free of loose throw rugs in walkways, pet beds, electrical cords, etc? Yes  Adequate lighting in your home to reduce risk of falls? Yes   ASSISTIVE DEVICES UTILIZED TO PREVENT FALLS:  Life alert? No  Use of a cane, walker or w/c? No  Grab bars in the bathroom? Yes  Shower chair or bench in shower? Yes  Elevated toilet seat or a handicapped toilet? Yes   TIMED UP AND GO:  Was the test performed? No . Telephonic visit.   Cognitive Function: Normal cognitive status assessed by direct observation by this Nurse Health Advisor. No abnormalities found.          Immunizations Immunization History  Administered Date(s) Administered   [employer] Sars-Covid-2 Vaccination 11/19/2019, 12/18/2019, 08/21/2020   Pneumococcal Conjugate-13 12/26/2013   Pneumococcal Polysaccharide-23 01/22/2015   Tdap 06/10/2019    TDAP status: Up to date  Flu Vaccine status: Declined, Education has been provided regarding the importance of this vaccine but patient still declined. Advised may receive this vaccine at local pharmacy or Health Dept. Aware to provide a copy of the vaccination record if obtained from local pharmacy or Health Dept. Verbalized acceptance and understanding.  Pneumococcal vaccine status: Up to date  Covid-19 vaccine status: Completed vaccines  Qualifies for Shingles Vaccine? Yes   Zostavax completed No   Shingrix Completed?: No.    Education has been provided regarding  the importance of this vaccine. Patient has been advised to call insurance company to determine out of pocket expense if they have not yet  received this vaccine. Advised may also receive vaccine at local pharmacy or Health Dept. Verbalized acceptance and understanding.  Screening Tests Health Maintenance  Topic Date Due   Hepatitis C Screening  Never done   Zoster Vaccines- Shingrix (1 of 2) Never done   COVID-19 Vaccine (4 - Booster for Moderna series) 10/16/2020   INFLUENZA VACCINE  01/02/2022 (Originally 05/05/2021)   MAMMOGRAM  11/01/2022   COLONOSCOPY (Pts 45-6yrs Insurance coverage will need to be confirmed)  06/04/2026   TETANUS/TDAP  06/09/2029   Pneumonia Vaccine 77+ Years old  Completed   DEXA SCAN  Completed   HPV VACCINES  Aged Out    Health Maintenance  Health Maintenance Due  Topic Date Due   Hepatitis C Screening  Never done   Zoster Vaccines- Shingrix (1 of 2) Never done   COVID-19 Vaccine (4 - Booster for Moderna series) 10/16/2020    Colorectal cancer screening: Type of screening: Colonoscopy. Completed 06/04/16. Repeat every 10 years  Mammogram status: Completed 11/01/20. Repeat every year  Bone Density status: Completed 05/03/19. Results reflect: Bone density results: NORMAL. Repeat every 2 years. Pt declines repeat screening at this time.   Lung Cancer Screening: (Low Dose CT Chest recommended if Age 84-80 years, 30 pack-year currently smoking OR have quit w/in 15years.) does not qualify.   Additional Screening:  Hepatitis C Screening: does qualify; postponed  Vision Screening: Recommended annual ophthalmology exams for early detection of glaucoma and other disorders of the eye. Is the patient up to date with their annual eye exam?  Yes  Who is the provider or what is the name of the office in which the patient attends annual eye exams? Dr. Clydene Pugh.   Dental Screening: Recommended annual dental exams for proper oral hygiene  Community Resource Referral / Chronic Care Management: CRR required this visit?  No   CCM required this visit?  No      Plan:     I have personally reviewed and  noted the following in the patients chart:   Medical and social history Use of alcohol, tobacco or illicit drugs  Current medications and supplements including opioid prescriptions.  Functional ability and status Nutritional status Physical activity Advanced directives List of other physicians Hospitalizations, surgeries, and ER visits in previous 12 months Vitals Screenings to include cognitive, depression, and falls Referrals and appointments  In addition, I have reviewed and discussed with patient certain preventive protocols, quality metrics, and best practice recommendations. A written personalized care plan for preventive services as well as general preventive health recommendations were provided to patient.   Due to this being a telephonic visit, the after visit summary with patients personalized plan was offered to patient via my-chart.   Reather Littler, LPN   02/09/2619   Nurse Notes: none

## 2021-10-14 ENCOUNTER — Other Ambulatory Visit: Payer: Self-pay | Admitting: Internal Medicine

## 2021-10-14 DIAGNOSIS — F411 Generalized anxiety disorder: Secondary | ICD-10-CM

## 2021-10-14 NOTE — Telephone Encounter (Signed)
Requested medications are due for refill today.  Unsure - refill was refused yesterday.  Requested medications are on the active medications list.  yes  Last refill. 07/01/2021 #30 with 0 refills  Future visit scheduled.   yes  Notes to clinic.  Medication not delegated. Requested was refused yesterday for "requesting refill too soon".    Requested Prescriptions  Pending Prescriptions Disp Refills   clonazePAM (KLONOPIN) 0.5 MG tablet [Pharmacy Med Name: clonazePAM 0.5 MG Oral Tablet] 30 tablet 0    Sig: TAKE 1 TABLET BY MOUTH AT BEDTIME     Not Delegated - Psychiatry:  Anxiolytics/Hypnotics Failed - 10/14/2021  3:55 PM      Failed - This refill cannot be delegated      Failed - Urine Drug Screen completed in last 360 days      Passed - Valid encounter within last 6 months    Recent Outpatient Visits           3 months ago Essential hypertension   [facility] Reubin Milan, MD   6 months ago Essential hypertension   [facility] Reubin Milan, MD   9 months ago Essential hypertension   [facility] Reubin Milan, MD   1 year ago Restless leg syndrome   [facility] Reubin Milan, MD   1 year ago Essential hypertension   [facility] Reubin Milan, MD       Future Appointments             In 1 week Judithann Graves Nyoka Cowden, MD [facility]

## 2021-10-15 NOTE — Telephone Encounter (Signed)
Please review. Last office visit 07/01/2021. ° °KP

## 2021-10-17 ENCOUNTER — Encounter: Payer: Self-pay | Admitting: Internal Medicine

## 2021-10-17 ENCOUNTER — Other Ambulatory Visit: Payer: Self-pay | Admitting: Internal Medicine

## 2021-10-17 DIAGNOSIS — F411 Generalized anxiety disorder: Secondary | ICD-10-CM

## 2021-10-17 MED ORDER — CLONAZEPAM 0.5 MG PO TABS: 0.5000 mg | ORAL_TABLET | Freq: Every evening | ORAL | 0 refills | Status: DC | PRN

## 2021-10-20 ENCOUNTER — Telehealth: Payer: Self-pay

## 2021-10-20 NOTE — Telephone Encounter (Signed)
Reminder: Please call and schedule mammogram [phone]. ° °PEC nurse may give results to patient if they return call to clinic, a CRM has been created. ° °KP °

## 2021-10-23 ENCOUNTER — Encounter: Payer: Self-pay | Admitting: Internal Medicine

## 2021-10-23 ENCOUNTER — Ambulatory Visit (INDEPENDENT_AMBULATORY_CARE_PROVIDER_SITE_OTHER): Payer: Medicare Other | Admitting: Internal Medicine

## 2021-10-23 ENCOUNTER — Other Ambulatory Visit: Payer: Self-pay

## 2021-10-23 VITALS — BP 138/80 | HR 75 | Ht 61.0 in | Wt 215.0 lb

## 2021-10-23 DIAGNOSIS — G2581 Restless legs syndrome: Secondary | ICD-10-CM | POA: Diagnosis not present

## 2021-10-23 DIAGNOSIS — Z0000 Encounter for general adult medical examination without abnormal findings: Secondary | ICD-10-CM

## 2021-10-23 DIAGNOSIS — E782 Mixed hyperlipidemia: Secondary | ICD-10-CM | POA: Diagnosis not present

## 2021-10-23 DIAGNOSIS — Z1159 Encounter for screening for other viral diseases: Secondary | ICD-10-CM

## 2021-10-23 DIAGNOSIS — K219 Gastro-esophageal reflux disease without esophagitis: Secondary | ICD-10-CM | POA: Diagnosis not present

## 2021-10-23 DIAGNOSIS — I10 Essential (primary) hypertension: Secondary | ICD-10-CM

## 2021-10-23 DIAGNOSIS — Z1231 Encounter for screening mammogram for malignant neoplasm of breast: Secondary | ICD-10-CM | POA: Diagnosis not present

## 2021-10-23 DIAGNOSIS — Z6841 Body Mass Index (BMI) 40.0 and over, adult: Secondary | ICD-10-CM | POA: Diagnosis not present

## 2021-10-23 MED ORDER — GABAPENTIN 300 MG PO CAPS: 300.0000 mg | ORAL_CAPSULE | Freq: Every day | ORAL | 1 refills | Status: DC

## 2021-10-23 MED ORDER — AMLODIPINE BESYLATE 5 MG PO TABS: 5.0000 mg | ORAL_TABLET | ORAL | 1 refills | Status: DC

## 2021-10-23 MED ORDER — OMEPRAZOLE 20 MG PO CPDR: 20.0000 mg | DELAYED_RELEASE_CAPSULE | Freq: Two times a day (BID) | ORAL | 1 refills | Status: DC

## 2021-10-23 MED ORDER — MELOXICAM 7.5 MG PO TABS: 7.5000 mg | ORAL_TABLET | Freq: Two times a day (BID) | ORAL | 0 refills | Status: DC

## 2021-10-23 NOTE — Progress Notes (Signed)
Date:  10/23/2021   Name:  Katrina Poole   DOB:  June 25, 1946   MRN:  [id number]   Chief Complaint: Annual Exam Katrina Poole is a 76 y.o. female who presents today for her Complete Annual Exam. She feels well. She reports exercising - work. She reports she is sleeping fairly well. Breast complaints - none.  Mammogram: 10/2020 [facility] DEXA: 04/2019 osteopenia Colonoscopy: 05/2016 repeat 10 yrs  Immunization History  Administered Date(s) Administered   Moderna Sars-Covid-2 Vaccination 11/19/2019, 12/18/2019, 08/21/2020   Pneumococcal Conjugate-13 12/26/2013   Pneumococcal Polysaccharide-23 01/22/2015   Tdap 06/10/2019   Episode blurred vision - in September had one episode that has not recurred.  Normal MRI brain, negative carotid US and normal Zio cardiac monitor.  She has had no further problems.  She believes it was caused by stress which she is managing.  Hypertension This is a chronic problem. The problem is controlled. Pertinent negatives include no chest pain, headaches, palpitations or shortness of breath. Past treatments include calcium channel blockers, ACE inhibitors and diuretics (normal at home since stopping beta blockers). The current treatment provides significant improvement. Hypertensive end-organ damage includes kidney disease. There is no history of CAD/MI or CVA.  Gastroesophageal Reflux She complains of heartburn. She reports no abdominal pain, no chest pain, no coughing, no dysphagia, no globus sensation, no sore throat or no wheezing. This is a recurrent problem. The problem occurs rarely. Pertinent negatives include no fatigue or weight loss. She has tried a PPI for the symptoms. The treatment provided significant relief.  Hyperlipidemia This is a chronic problem. The problem is uncontrolled. Pertinent negatives include no chest pain or shortness of breath. She is currently on no antihyperlipidemic treatment.   Lab Results  Component Value Date   NA 136  07/01/2021   K 4.0 07/01/2021   CO2 24 07/01/2021   GLUCOSE 99 07/01/2021   BUN 22 07/01/2021   CREATININE 1.03 (H) 07/01/2021   CALCIUM 9.5 07/01/2021   EGFR 57 (L) 07/01/2021   GFRNONAA >60 01/22/2021   Lab Results  Component Value Date   CHOL 224 (H) 07/01/2021   HDL 68 07/01/2021   LDLCALC 125 (H) 07/01/2021   TRIG 179 (H) 07/01/2021   CHOLHDL 3.3 07/01/2021   Lab Results  Component Value Date   TSH 1.610 07/01/2021   No results found for: HGBA1C Lab Results  Component Value Date   WBC 9.8 07/01/2021   HGB 11.5 07/01/2021   HCT 33.2 (L) 07/01/2021   MCV 86 07/01/2021   PLT 334 07/01/2021   Lab Results  Component Value Date   ALT 19 07/01/2021   AST 18 07/01/2021   ALKPHOS 119 07/01/2021   BILITOT 0.3 07/01/2021   No results found for: 25OHVITD2, 25OHVITD3, VD25OH   Review of Systems  Constitutional:  Negative for chills, fatigue, fever and weight loss.  HENT:  Negative for congestion, hearing loss, sore throat, tinnitus, trouble swallowing and voice change.   Eyes:  Negative for visual disturbance.  Respiratory:  Negative for cough, chest tightness, shortness of breath and wheezing.   Cardiovascular:  Negative for chest pain, palpitations and leg swelling.  Gastrointestinal:  Positive for heartburn. Negative for abdominal pain, constipation, diarrhea, dysphagia and vomiting.  Endocrine: Negative for polydipsia and polyuria.  Genitourinary:  Negative for dysuria, frequency, genital sores, vaginal bleeding and vaginal discharge.  Musculoskeletal:  Positive for back pain and gait problem. Negative for arthralgias and joint swelling.  Skin:  Negative for  color change and rash.  Neurological:  Negative for dizziness, tremors, light-headedness and headaches.  Hematological:  Negative for adenopathy. Does not bruise/bleed easily.  Psychiatric/Behavioral:  Positive for sleep disturbance. Negative for dysphoric mood. The patient is not nervous/anxious.    Patient  Active Problem List   Diagnosis Date Noted   Lumbar radiculopathy 07/21/2021   Heart palpitations 07/01/2021   Mixed hyperlipidemia 07/01/2021   Generalized anxiety disorder 07/01/2021   Body mass index (BMI) 40.0-44.9, adult ([place]) 06/12/2021   Lumbago of lumbar region with sciatica 06/12/2021   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   Essential hypertension 08/16/2020   Meniere's disease 08/16/2020   Restless leg syndrome 08/16/2020   Primary insomnia 08/16/2020   Mild intermittent asthma without complication 14/60/4799   Diverticulosis of colon 08/16/2020   HNP (herniated nucleus pulposus), lumbar 04/29/2020   Gastroesophageal reflux disease without esophagitis 01/24/2020   Spondylosis of cervical spine 03/16/2018   Osteoarthritis of knee 02/06/2018   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     Beef-Derived Products Other (See Comments)    Tears up my stomach.    Past Surgical History:  Procedure Laterality Date   ANTERIOR CERVICAL DECOMP/DISCECTOMY FUSION N/A 03/16/2018   Procedure: Anterior Cervical Discectomy Fusion - Cervical Three- Cervical Four, Cervical Four- Cervical Five, Cervical Five-Cervical Six, Cervical Six- Cerival Seven;  Surgeon: Kary Kos, MD;  Location: [facility];  Service: Neurosurgery;  Laterality: N/A;  Anterior Cervical Discectomy Fusion - Cervical Three- Cervical Four, Cervical Four- Cervical Five, Cervical Five-Cervical Six, Cervical Six- Ceriva   CARDIAC CATHETERIZATION     x2 both normal   CARPAL TUNNEL RELEASE     COLONOSCOPY     COLONOSCOPY WITH PROPOFOL N/A 06/04/2016   Procedure: COLONOSCOPY WITH PROPOFOL;  Surgeon: Lollie Sails, MD;  Location: [facility]  ENDOSCOPY;  Service: Endoscopy;  Laterality: N/A;   DILATION AND CURETTAGE OF UTERUS     ECTOPIC PREGNANCY SURGERY     ESOPHAGOGASTRODUODENOSCOPY (EGD) WITH PROPOFOL N/A 06/04/2016   Procedure: ESOPHAGOGASTRODUODENOSCOPY (EGD) WITH PROPOFOL;  Surgeon: Lollie Sails, MD;  Location: [facility] ENDOSCOPY;  Service: Endoscopy;  Laterality: N/A;   exploratory laparoscopy     infertility surgery   HALLUX VALGUS CORRECTION Bilateral    JOINT REPLACEMENT Bilateral    partial knee   KNEE ARTHROPLASTY Left 01/27/2021   Procedure: CONVERSION OF LEFT PARTIAL KNEE TO LEFT TOTAL KNEE.;  Surgeon: Dereck Leep, MD;  Location: [facility] ORS;  Service: Orthopedics;  Laterality: Left;   KNEE ARTHROSCOPY Left 07/19/2015   Procedure: Arthroscopic limited synovectomy along with lateral compartment and retropatellar chondral debridement plus removal of several small loose bodies;  Surgeon: Leanor Kail, MD;  Location: [facility];  Service: Orthopedics;  Laterality: Left;  1ST CASE PER CECE   LUMBAR LAMINECTOMY/DECOMPRESSION MICRODISCECTOMY Right 04/29/2020   Procedure: Microdiscectomy - Lumbar Three-Lumbar Four - Lumbar Four-Lumbar Five - right;  Surgeon: Kary Kos, MD;  Location: [facility];  Service: Neurosurgery;  Laterality: Right;  Microdiscectomy - Lumbar Three-Lumbar Four - Lumbar Four-Lumbar Five - right   MEDIAL PARTIAL KNEE REPLACEMENT Bilateral    SHOULDER SURGERY Right    TOTAL HIP ARTHROPLASTY Left 11/25/2015   Procedure: TOTAL HIP ARTHROPLASTY;  Surgeon: Dereck Leep, MD;  Location: [facility] ORS;  Service: Orthopedics;  Laterality: Left;   TRIGGER FINGER RELEASE Bilateral     Social History   Tobacco Use   Smoking status: Former    Packs/day: 1.50    Years: 30.00    Pack years: 45.00    Types: Cigarettes    Quit date: 11/12/1990    Years since quitting: 30.9   Smokeless tobacco: Never  Vaping Use   Vaping Use: Never used  Substance Use Topics   Alcohol use: Yes    Alcohol/week: 1.0 standard  drink    Types: 1 Standard drinks or equivalent per week    Comment: [facility] [place] [facility] [place]   Drug use: No     Medication list has been reviewed and updated.  Current Meds  Medication Sig   acetaminophen (TYLENOL) 500 MG tablet Take 2 tablets (1,000 mg total) by mouth every 6 (six) hours as needed (DO NOT EXCEED 4000 MG DAILY FROM ALL SOURCES).   albuterol (VENTOLIN HFA) 108 (90 Base) MCG/ACT inhaler Inhale 2 puffs into the lungs every 6 (six) hours as needed for wheezing or shortness of breath.   alum hydroxide-mag trisilicate (GAVISCON) 54-65 MG CHEW chewable tablet Chew 1 tablet by mouth daily as needed for indigestion or heartburn.    amLODipine (NORVASC) 5 MG tablet Take 1 tablet (5 mg total) by mouth every morning.   cephALEXin (KEFLEX) 500 MG capsule Take 1 capsule (500 mg total) by mouth as needed. TAKE 4 PRIOR TO DENTAL PROCEDURES   cholecalciferol (VITAMIN D3) 25 MCG (1000 UNIT) tablet Take 1,000 Units by mouth at bedtime.   clonazePAM (KLONOPIN) 0.5 MG tablet Take 1 tablet (0.5 mg total) by mouth at bedtime as needed for anxiety.   fosinopril (MONOPRIL) 40 MG tablet Take 1 tablet (40 mg total) by mouth daily.   gabapentin (NEURONTIN) 300 MG capsule Take 1-2 capsules (300-600 mg total) by mouth at bedtime.   hydrochlorothiazide (HYDRODIURIL) 25 MG tablet Take 1 tablet (25 mg total) by mouth every morning.   meclizine (ANTIVERT) 12.5 MG tablet Take 12.5 mg by mouth 3 (three) times daily as needed for dizziness. Dr. Marlyce Huge   meloxicam (MOBIC) 7.5 MG tablet Take 1 tablet (7.5 mg total) by mouth 2 (two) times daily.   Multiple Vitamin (MULTIVITAMIN) tablet Take 1 tablet by mouth every morning.    omeprazole (PRILOSEC) 20 MG capsule Take 1 capsule (20 mg total) by mouth 2 (two) times daily before a meal.   traMADol (ULTRAM) 50 MG tablet Take 50 mg by mouth every 6 (six) hours as needed.   zolpidem (AMBIEN) 5 MG tablet TAKE 1 TABLET BY MOUTH AT BEDTIME    PHQ 2/9 Scores 10/23/2021 10/13/2021  07/01/2021 04/17/2021  PHQ - 2 Score 0 0 0 0  PHQ- 9 Score 1 - 1 1    GAD 7 : Generalized Anxiety Score 10/23/2021 07/01/2021 04/17/2021 12/26/2020  Nervous, Anxious, on Edge 0 1 0 0  Control/stop worrying 0 1 0 0  Worry too much - different things 0 1 0 0  Trouble relaxing 1 1 0 0  Restless 1 1 0 0  Easily annoyed or irritable 0 2 0 0  Afraid - awful might happen 1 0 0 0  Total GAD 7 Score 3 7 0 0  Anxiety Difficulty - Not difficult at all Not difficult at all -    BP Readings from Last 3 Encounters:  10/23/21 138/80  08/04/21 (!) 150/78  07/01/21 138/82    Physical Exam Vitals and nursing note reviewed.  Constitutional:      General: She is not in acute distress.    Appearance: She is well-developed.  HENT:     Head: Normocephalic and atraumatic.     Right Ear: Tympanic membrane and ear canal normal.     Left Ear: Tympanic membrane and ear canal normal.     Nose:     Right Sinus: No maxillary sinus tenderness.     Left Sinus: No maxillary sinus tenderness.  Eyes:     General: No scleral icterus.       Right eye: No discharge.        Left eye: No discharge.     Conjunctiva/sclera: Conjunctivae normal.  Neck:     Thyroid: No thyromegaly.     Vascular: No carotid bruit.  Cardiovascular:     Rate and Rhythm: Normal rate and regular rhythm.     Pulses: Normal pulses.     Heart sounds: Normal heart sounds.  Pulmonary:     Effort: Pulmonary effort is normal. No respiratory distress.     Breath sounds: No wheezing.  Chest:  Breasts:    Right: No mass, nipple discharge, skin change or tenderness.     Left: No mass, nipple discharge, skin change or tenderness.  Abdominal:     General: Bowel sounds are normal.     Palpations: Abdomen is soft.     Tenderness: There is no abdominal tenderness.  Musculoskeletal:     Cervical back: Normal range of motion. No erythema.     Lumbar back: Decreased range of motion.     Right lower leg: No edema.     Left lower leg: No edema.   Lymphadenopathy:     Cervical: No cervical adenopathy.  Skin:    General: Skin is warm and dry.     Findings: No rash.  Neurological:     General: No focal deficit present.     Mental Status: She is alert and oriented to person, place, and time.     Cranial Nerves: No cranial nerve deficit.     Sensory: No sensory deficit.     Motor: Motor function is intact.     Coordination: Coordination is intact.     Deep Tendon Reflexes: Reflexes are normal and symmetric.  Psychiatric:        Attention and Perception: Attention normal.        Mood and Affect: Mood normal.    Wt Readings from Last 3 Encounters:  10/23/21 215 lb (97.5 kg)  08/04/21 214 lb 6 oz (97.2 kg)  07/01/21 218 lb (98.9 kg)    BP 138/80    Pulse 75    Ht 5' 1" (1.549 m)    Wt 215 lb (97.5 kg)    SpO2 95%    BMI 40.62 kg/m   Assessment and Plan: 1. Annual physical exam Exam is normal except for weight. Encourage regular exercise and appropriate dietary changes. Up to date on screenings and immunizations. Declines flu vaccine - Hemoglobin A1c  2. Encounter for screening mammogram for breast cancer To be scheduled at [facility]  3. Essential hypertension Clinically stable exam with well controlled BP. Tolerating medications without side effects at this time. Pt to continue current regimen and low sodium diet; benefits of regular exercise as able discussed. - Comprehensive metabolic panel - TSH - POCT urinalysis dipstick - amLODipine (NORVASC) 5 MG tablet; Take 1 tablet (5 mg  total) by mouth every morning.  Dispense: 90 tablet; Refill: 1  4. Gastroesophageal reflux disease without esophagitis Symptoms well controlled on daily PPI No red flag signs such as weight loss, n/v, melena Will continue omeprazole. - CBC with Differential/Platelet - omeprazole (PRILOSEC) 20 MG capsule; Take 1 capsule (20 mg total) by mouth 2 (two) times daily before a meal.  Dispense: 180 capsule; Refill: 1  5. Mixed  hyperlipidemia Check labs and advise - Lipid panel  6. Restless leg syndrome Sleeping well most nights Uses clonazepam PRN when travelling or camping - gabapentin (NEURONTIN) 300 MG capsule; Take 1-2 capsules (300-600 mg total) by mouth at bedtime.  Dispense: 180 capsule; Refill: 1  7. Need for hepatitis C screening test - Hepatitis C antibody  8. BMI 40.0-44.9, adult ([facility]) Weight loss encouraged.   Partially dictated using Editor, commissioning. Any errors are unintentional.  Halina Maidens, MD [facility]  10/23/2021

## 2021-10-24 LAB — COMPREHENSIVE METABOLIC PANEL
ALT: 12 IU/L (ref 0–32)
AST: 19 IU/L (ref 0–40)
Albumin/Globulin Ratio: 2 (ref 1.2–2.2)
Albumin: 4.4 g/dL (ref 3.7–4.7)
Alkaline Phosphatase: 104 IU/L (ref 44–121)
BUN/Creatinine Ratio: 19 (ref 12–28)
BUN: 16 mg/dL (ref 8–27)
Bilirubin Total: 0.4 mg/dL (ref 0.0–1.2)
CO2: 25 mmol/L (ref 20–29)
Calcium: 9.5 mg/dL (ref 8.7–10.3)
Chloride: 98 mmol/L (ref 96–106)
Creatinine, Ser: 0.86 mg/dL (ref 0.57–1.00)
Globulin, Total: 2.2 g/dL (ref 1.5–4.5)
Glucose: 94 mg/dL (ref 70–99)
Potassium: 4.4 mmol/L (ref 3.5–5.2)
Sodium: 137 mmol/L (ref 134–144)
Total Protein: 6.6 g/dL (ref 6.0–8.5)
eGFR: 71 mL/min/{1.73_m2} (ref >59)

## 2021-10-24 LAB — CBC WITH DIFFERENTIAL/PLATELET
Basophils Absolute: 0.1 10*3/uL (ref 0.0–0.2)
Basos: 1 %
EOS (ABSOLUTE): 0.1 10*3/uL (ref 0.0–0.4)
Eos: 2 %
Hematocrit: 33.9 % — ABNORMAL LOW (ref 34.0–46.6)
Hemoglobin: 11.5 g/dL (ref 11.1–15.9)
Immature Grans (Abs): 0 10*3/uL (ref 0.0–0.1)
Immature Granulocytes: 0 %
Lymphocytes Absolute: 1 10*3/uL (ref 0.7–3.1)
Lymphs: 16 %
MCH: 30.3 pg (ref 26.6–33.0)
MCHC: 33.9 g/dL (ref 31.5–35.7)
MCV: 89 fL (ref 79–97)
Monocytes Absolute: 0.5 10*3/uL (ref 0.1–0.9)
Monocytes: 9 %
Neutrophils Absolute: 4.2 10*3/uL (ref 1.4–7.0)
Neutrophils: 72 %
Platelets: 282 10*3/uL (ref 150–450)
RBC: 3.79 x10E6/uL (ref 3.77–5.28)
RDW: 13.1 % (ref 11.7–15.4)
WBC: 5.9 10*3/uL (ref 3.4–10.8)

## 2021-10-24 LAB — HEMOGLOBIN A1C
Est. average glucose Bld gHb Est-mCnc: 117 mg/dL
Hgb A1c MFr Bld: 5.7 % — ABNORMAL HIGH (ref 4.8–5.6)

## 2021-10-24 LAB — LIPID PANEL
Chol/HDL Ratio: 3 ratio (ref 0.0–4.4)
Cholesterol, Total: 211 mg/dL — ABNORMAL HIGH (ref 100–199)
HDL: 70 mg/dL (ref >39)
LDL Chol Calc (NIH): 123 mg/dL — ABNORMAL HIGH (ref 0–99)
Triglycerides: 103 mg/dL (ref 0–149)
VLDL Cholesterol Cal: 18 mg/dL (ref 5–40)

## 2021-10-24 LAB — HEPATITIS C ANTIBODY: Hep C Virus Ab: <0.1 s/co ratio (ref 0.0–0.9)

## 2021-10-24 LAB — TSH: TSH: 1.9 u[IU]/mL (ref 0.450–4.500)

## 2021-11-19 ENCOUNTER — Other Ambulatory Visit: Payer: Self-pay | Admitting: Internal Medicine

## 2021-11-19 NOTE — Telephone Encounter (Signed)
Requested Prescriptions  Pending Prescriptions Disp Refills   hydrochlorothiazide (HYDRODIURIL) 25 MG tablet [Pharmacy Med Name: hydroCHLOROthiazide 25 MG Oral Tablet] 90 tablet 1    Sig: TAKE 1 TABLET BY MOUTH ONCE DAILY IN THE MORNING     Cardiovascular: Diuretics - Thiazide Passed - 11/19/2021  8:02 AM      Passed - Cr in normal range and within 180 days    Creatinine, Ser  Date Value Ref Range Status  10/23/2021 0.86 0.57 - 1.00 mg/dL Final         Passed - K in normal range and within 180 days    Potassium  Date Value Ref Range Status  10/23/2021 4.4 3.5 - 5.2 mmol/L Final         Passed - Na in normal range and within 180 days    Sodium  Date Value Ref Range Status  10/23/2021 137 134 - 144 mmol/L Final         Passed - Last BP in normal range    BP Readings from Last 1 Encounters:  10/23/21 138/80         Passed - Valid encounter within last 6 months    Recent Outpatient Visits          3 weeks ago Annual physical exam   [facility] Reubin Milan, MD   4 months ago Essential hypertension   [facility] Reubin Milan, MD   7 months ago Essential hypertension   [facility] Reubin Milan, MD   10 months ago Essential hypertension   [facility] Reubin Milan, MD   1 year ago Restless leg syndrome   [facility] Reubin Milan, MD      Future Appointments            In 5 months Judithann Graves Nyoka Cowden, MD [facility]

## 2021-12-16 DIAGNOSIS — M5126 Other intervertebral disc displacement, lumbar region: Secondary | ICD-10-CM | POA: Diagnosis not present

## 2021-12-16 DIAGNOSIS — I10 Essential (primary) hypertension: Secondary | ICD-10-CM | POA: Diagnosis not present

## 2021-12-18 ENCOUNTER — Other Ambulatory Visit: Payer: Self-pay | Admitting: Neurosurgery

## 2021-12-24 ENCOUNTER — Encounter: Payer: Self-pay | Admitting: Internal Medicine

## 2021-12-24 ENCOUNTER — Other Ambulatory Visit: Payer: Self-pay

## 2021-12-24 DIAGNOSIS — I10 Essential (primary) hypertension: Secondary | ICD-10-CM

## 2021-12-24 MED ORDER — FOSINOPRIL SODIUM 40 MG PO TABS: 40.0000 mg | ORAL_TABLET | Freq: Every day | ORAL | 0 refills | Status: DC

## 2022-01-05 IMAGING — CT CT L SPINE W/O CM
1 series · 12 of 14 positions shown, 15 images · non-contrast
Comparison: 06/18/2021 lumbar MRI

CLINICAL DATA: Herniated lumbar disc without myelopathy. Continued
pain in the right hip and leg.

EXAM:
CT LUMBAR SPINE WITHOUT CONTRAST
TECHNIQUE: Multidetector CT imaging of the lumbar spine was performed without
intravenous contrast administration. Multiplanar CT image
reconstructions were also generated.

[Series 3: l spine soft · axial · 0.35mm/px · z∈[-463,-245]mm · 12 of 129 slices shown, 15 images]
[im 10/129  soft-tissue]
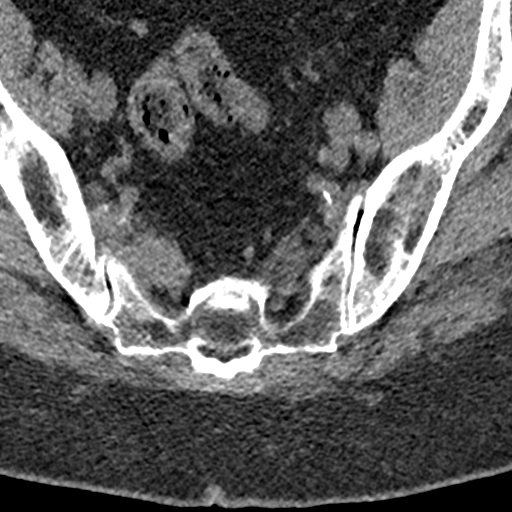
[im 10/129  bone]
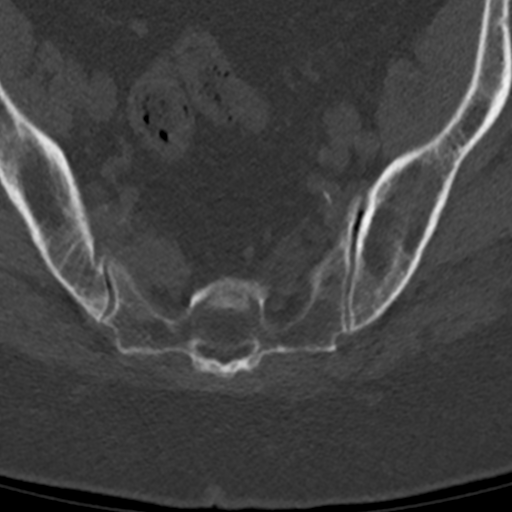
[im 20/129  bone]
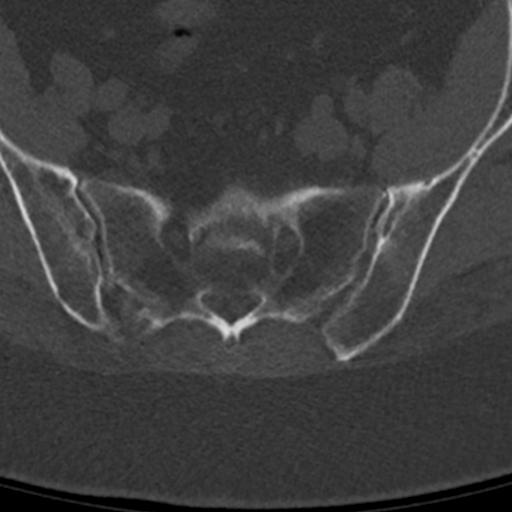
[im 30/129  bone]
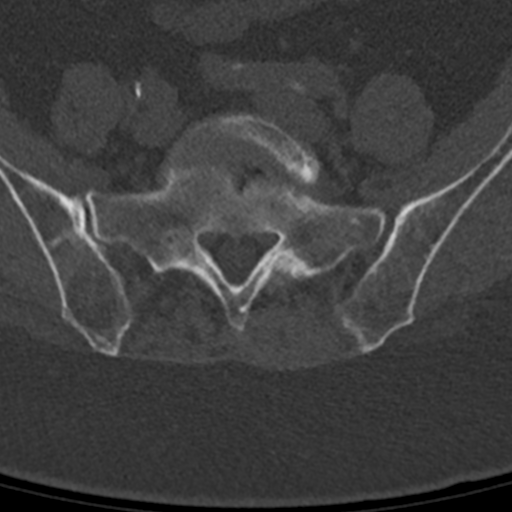
[im 40/129  bone]
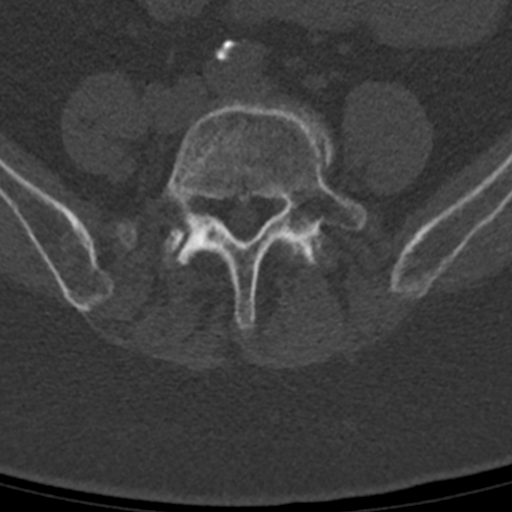
[im 50/129  soft-tissue]
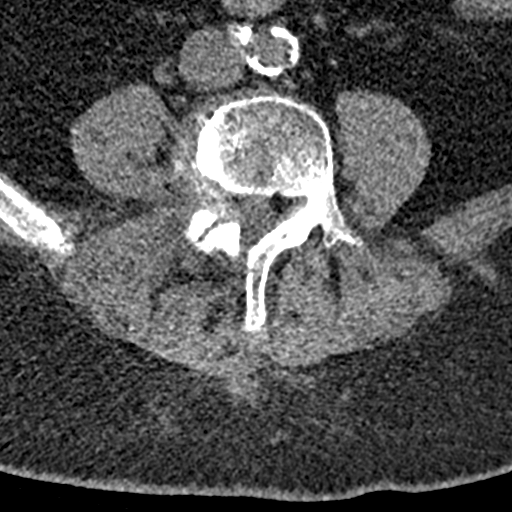
[im 50/129  bone]
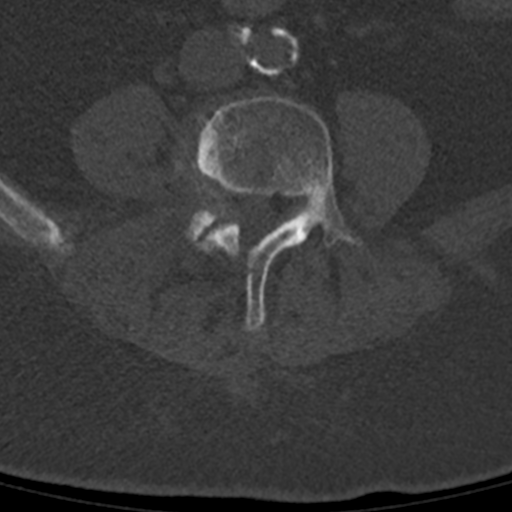
[im 60/129  bone]
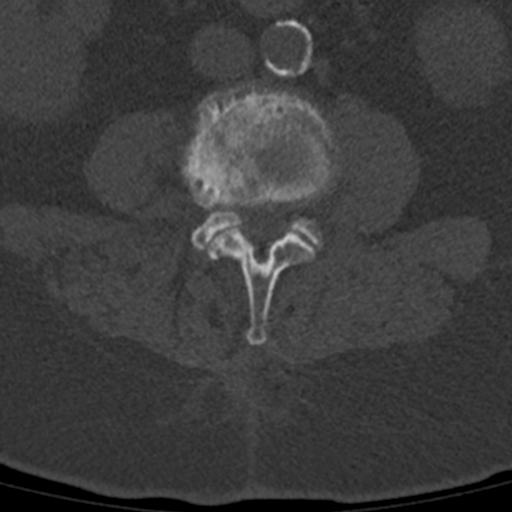
[im 69/129  bone]
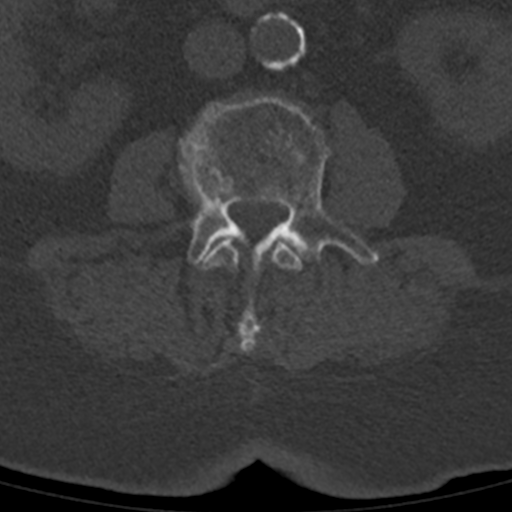
[im 79/129  bone]
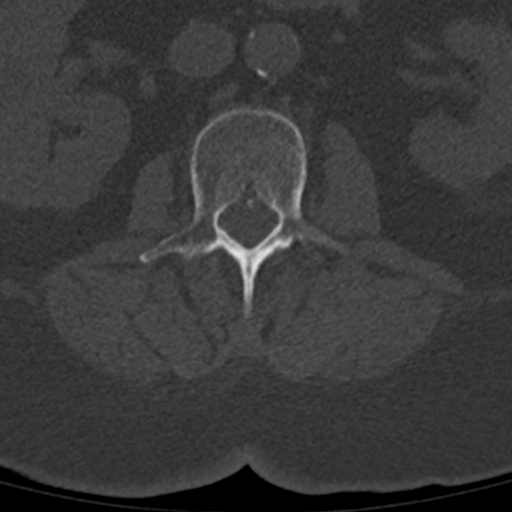
[im 89/129  soft-tissue]
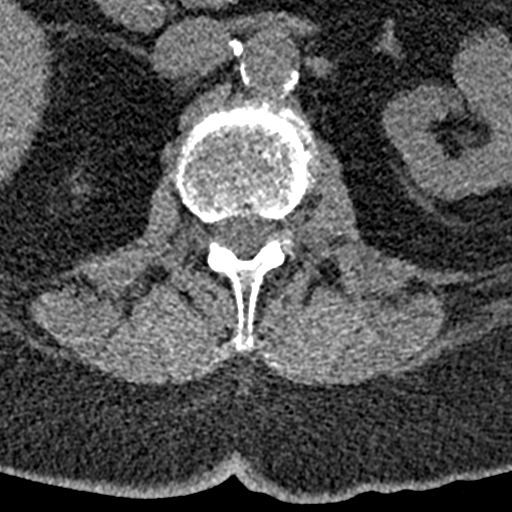
[im 89/129  bone]
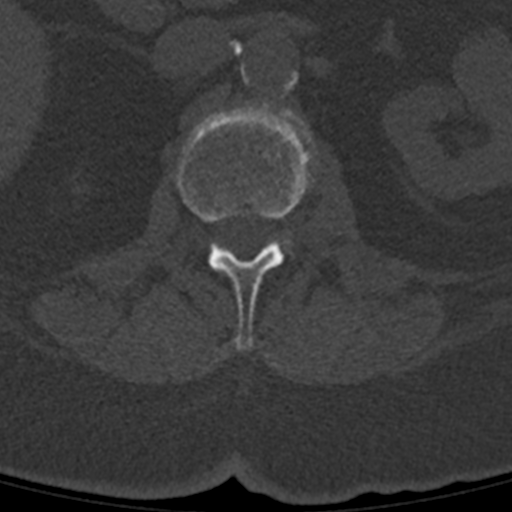
[im 99/129  bone]
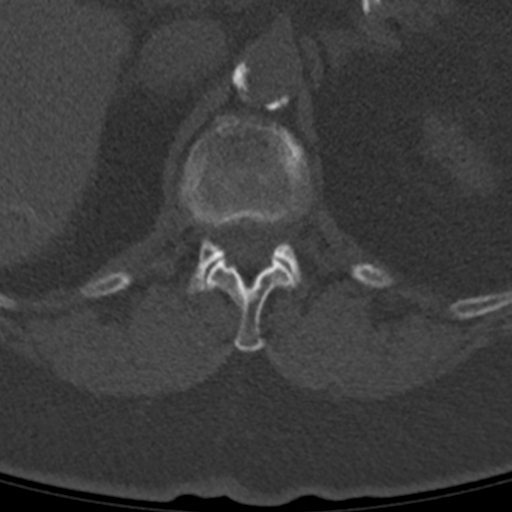
[im 109/129  bone]
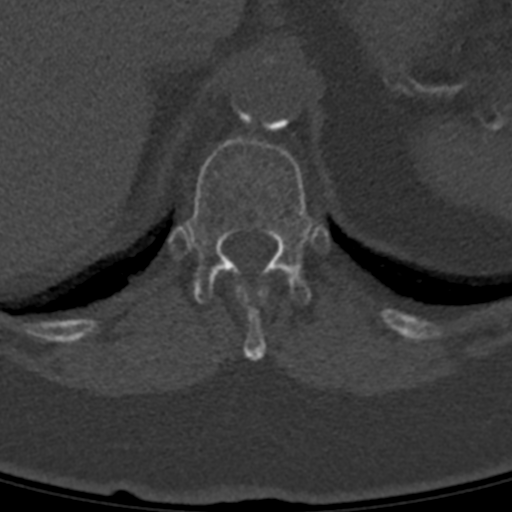
[im 119/129  bone]
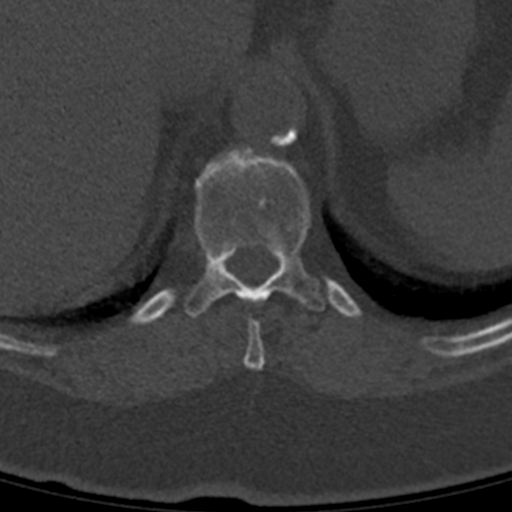

[12 of 14 positions shown; findings below may reference images not displayed]

FINDINGS: Segmentation: Standard lumbar numbering as previously assigned.

Alignment: Mild levoscoliosis. Slight L5-S1 anterolisthesis

Vertebrae: No acute fracture or focal pathologic process.

Paraspinal and other soft tissues: Atheromatous calcifications and
right renal cystic densities.

Disc levels:

T12- L1: Mild disc space narrowing

L1-L2: Disc space narrowing and mild endplate degeneration with
partially calcified disc that bulges into the bilateral foramina.
Bilateral foraminal stenosis is mild to moderate

L2-L3: Disc collapse with mild endplate degeneration. Bilateral
facet spurring. Mild spinal and bilateral foraminal stenosis.

L3-L4: Disc collapse with endplate ridging and degeneration
eccentric to the right where there is greater facet spurring. Right
foraminal impingement. Mild or moderate spinal stenosis

L4-L5: Disc collapse with large gas containing fissure eccentric to
the right where there is also greater endplate degeneration.
Circumferential disc bulging and facet spurring. Chronic right L4
pars defect, although new from 3634 lumbar MRI. Right foraminal
impingement. Moderate spinal stenosis.

L5-S1:Facet degeneration and anterolisthesis. Disc space narrowing
and bulging eccentric to the left. Spinal stenosis is mild.
IMPRESSION: 1. No significant change from proceeding lumbar MRI 06/18/2021.
[DATE]. Advanced lumbar spine degeneration with mild scoliosis and L5-S1
anterolisthesis.
3. Multilevel foraminal narrowing with high-grade impingement on the
right at L3-4 and L4-5.
4. Chronic (although acquired since 3634) right L4 pars defect.
5. Up to moderate spinal stenosis at L4-5.

## 2022-01-22 DIAGNOSIS — Z96643 Presence of artificial hip joint, bilateral: Secondary | ICD-10-CM | POA: Diagnosis not present

## 2022-01-22 DIAGNOSIS — M25551 Pain in right hip: Secondary | ICD-10-CM | POA: Diagnosis not present

## 2022-01-22 DIAGNOSIS — M5440 Lumbago with sciatica, unspecified side: Secondary | ICD-10-CM | POA: Diagnosis not present

## 2022-01-22 DIAGNOSIS — Z96653 Presence of artificial knee joint, bilateral: Secondary | ICD-10-CM | POA: Diagnosis not present

## 2022-01-23 ENCOUNTER — Encounter: Payer: Self-pay | Admitting: Internal Medicine

## 2022-01-23 ENCOUNTER — Other Ambulatory Visit: Payer: Self-pay | Admitting: Internal Medicine

## 2022-01-23 DIAGNOSIS — F5101 Primary insomnia: Secondary | ICD-10-CM

## 2022-01-23 MED ORDER — ZOLPIDEM TARTRATE 5 MG PO TABS: 5.0000 mg | ORAL_TABLET | Freq: Every day | ORAL | 5 refills | Status: DC

## 2022-02-02 ENCOUNTER — Other Ambulatory Visit (HOSPITAL_COMMUNITY): Payer: Medicare Other

## 2022-02-02 HISTORY — PX: POSTERIOR LUMBAR FUSION: SHX6036

## 2022-02-03 NOTE — Pre-Procedure Instructions (Signed)
Surgical Instructions ? ? ? Your procedure is scheduled on Monday, May 8th. ? Report to [facility] Main Entrance "A" at 07:30 A.M., then check in with the Admitting office. ? Call this number if you have problems the morning of surgery: ? [phone] ? ? If you have any questions prior to your surgery date call [phone]: Open Monday-Friday 8am-4pm ? ? ? Remember: ? Do not eat or drink after midnight the night before your surgery ? ?  ? Take these medicines the morning of surgery with A SIP OF WATER  ?amLODipine (NORVASC)  ?omeprazole (PRILOSEC)  ? ? ?If needed: ?acetaminophen (TYLENOL)  ?albuterol (VENTOLIN HFA)- if needed, bring inhaler with you on day of surgery ?alum hydroxide-mag trisilicate (GAVISCON) ?meclizine (ANTIVERT) ?traMADol Janean Sark)  ? ? ?As of today, STOP taking any Aspirin (unless otherwise instructed by your surgeon) Aleve, Naproxen, Ibuprofen, Motrin, Advil, Goody's, BC's, all herbal medications, fish oil, and all vitamins. This includes meloxicam (MOBIC). ?         ?           ?Do NOT Smoke (Tobacco/Vaping) for 24 hours prior to your procedure. ? ?If you use a CPAP at night, you may bring your mask/headgear for your overnight stay. ?  ?Contacts, glasses, piercing's, hearing aid's, dentures or partials may not be worn into surgery, please bring cases for these belongings.  ?  ?For patients admitted to the hospital, discharge time will be determined by your treatment team. ?  ?Patients discharged the day of surgery will not be allowed to drive home, and someone needs to stay with them for 24 hours. ? ?SURGICAL WAITING ROOM VISITATION ?Patients having surgery or a procedure may have two support people in the waiting room. These visitors may be switched out with other visitors if needed. ?Children under the age of 69 must have an adult accompany them who is not the patient. ?If the patient needs to stay at the hospital during part of their recovery, the visitor guidelines for inpatient rooms  apply. ? ?Please refer to the [facility] website for the visitor guidelines for Inpatients (after your surgery is over and you are in a regular room).  ? ? ?Special instructions:   ?[facility]- Preparing For Surgery ? ?Before surgery, you can play an important role. Because skin is not sterile, your skin needs to be as free of germs as possible. You can reduce the number of germs on your skin by washing with CHG (chlorahexidine gluconate) Soap before surgery.  CHG is an antiseptic cleaner which kills germs and bonds with the skin to continue killing germs even after washing.   ? ?Oral Hygiene is also important to reduce your risk of infection.  Remember - BRUSH YOUR TEETH THE MORNING OF SURGERY WITH YOUR REGULAR TOOTHPASTE ? ?Please do not use if you have an allergy to CHG or antibacterial soaps. If your skin becomes reddened/irritated stop using the CHG.  ?Do not shave (including legs and underarms) for at least 48 hours prior to first CHG shower. It is OK to shave your face. ? ?Please follow these instructions carefully. ?  ?Shower the NIGHT BEFORE SURGERY and the MORNING OF SURGERY ? ?If you chose to wash your hair, wash your hair first as usual with your normal shampoo. ? ?After you shampoo, rinse your hair and body thoroughly to remove the shampoo. ? ?Use CHG Soap as you would any other liquid soap. You can apply CHG directly to the skin and wash gently with  a scrungie or a clean washcloth.  ? ?Apply the CHG Soap to your body ONLY FROM THE NECK DOWN.  Do not use on open wounds or open sores. Avoid contact with your eyes, ears, mouth and genitals (private parts). Wash Face and genitals (private parts)  with your normal soap.  ? ?Wash thoroughly, paying special attention to the area where your surgery will be performed. ? ?Thoroughly rinse your body with warm water from the neck down. ? ?DO NOT shower/wash with your normal soap after using and rinsing off the CHG Soap. ? ?Pat yourself dry with a CLEAN  TOWEL. ? ?Wear CLEAN PAJAMAS to bed the night before surgery ? ?Place CLEAN SHEETS on your bed the night before your surgery ? ?DO NOT SLEEP WITH PETS. ? ? ?Day of Surgery: ?Take a shower with CHG soap. ?Do not wear jewelry or makeup ?Do not wear lotions, powders, perfumes, or deodorant. ?Do not shave 48 hours prior to surgery.   ?Do not bring valuables to the hospital.  ?[facility] is not responsible for any belongings or valuables. ?Do not wear nail polish, gel polish, artificial nails, or any other type of covering on natural nails (fingers and toes) ?If you have artificial nails or gel coating that need to be removed by a nail salon, please have this removed prior to surgery. Artificial nails or gel coating may interfere with anesthesia's ability to adequately monitor your vital signs. ?Wear Clean/Comfortable clothing the morning of surgery ?Remember to brush your teeth WITH YOUR REGULAR TOOTHPASTE. ?  ?Please read over the following fact sheets that you were given. ? ? ? ?If you received a COVID test during your pre-op visit  it is requested that you wear a mask when out in public, stay away from anyone that may not be feeling well and notify your surgeon if you develop symptoms. If you have been in contact with anyone that has tested positive in the last 10 days please notify you surgeon.  ?

## 2022-02-04 ENCOUNTER — Other Ambulatory Visit: Payer: Self-pay

## 2022-02-04 ENCOUNTER — Encounter (HOSPITAL_COMMUNITY): Payer: Self-pay

## 2022-02-04 ENCOUNTER — Encounter (HOSPITAL_COMMUNITY)
Admission: RE | Admit: 2022-02-04 | Discharge: 2022-02-04 | Disposition: A | Discharge status: Home / Self Care | Payer: Medicare Other | Source: Ambulatory Visit | Attending: Neurosurgery | Admitting: Neurosurgery

## 2022-02-04 VITALS — BP 148/90 | HR 77 | Temp 97.5°F | Resp 17 | Ht 60.0 in | Wt 210.0 lb

## 2022-02-04 DIAGNOSIS — Z01812 Encounter for preprocedural laboratory examination: Secondary | ICD-10-CM | POA: Insufficient documentation

## 2022-02-04 DIAGNOSIS — M5126 Other intervertebral disc displacement, lumbar region: Secondary | ICD-10-CM | POA: Diagnosis not present

## 2022-02-04 DIAGNOSIS — I10 Essential (primary) hypertension: Secondary | ICD-10-CM | POA: Insufficient documentation

## 2022-02-04 DIAGNOSIS — Z01818 Encounter for other preprocedural examination: Secondary | ICD-10-CM

## 2022-02-04 LAB — BASIC METABOLIC PANEL
Anion gap: 12 (ref 5–15)
BUN: 21 mg/dL (ref 8–23)
CO2: 25 mmol/L (ref 22–32)
Calcium: 9.7 mg/dL (ref 8.9–10.3)
Chloride: 101 mmol/L (ref 98–111)
Creatinine, Ser: 0.83 mg/dL (ref 0.44–1.00)
GFR, Estimated: >60 mL/min (ref >60)
Glucose, Bld: 102 mg/dL — ABNORMAL HIGH (ref 70–99)
Potassium: 3.6 mmol/L (ref 3.5–5.1)
Sodium: 138 mmol/L (ref 135–145)

## 2022-02-04 LAB — CBC
HCT: 35.6 % — ABNORMAL LOW (ref 36.0–46.0)
Hemoglobin: 11.4 g/dL — ABNORMAL LOW (ref 12.0–15.0)
MCH: 29 pg (ref 26.0–34.0)
MCHC: 32 g/dL (ref 30.0–36.0)
MCV: 90.6 fL (ref 80.0–100.0)
Platelets: 308 K/uL (ref 150–400)
RBC: 3.93 MIL/uL (ref 3.87–5.11)
RDW: 14.2 % (ref 11.5–15.5)
WBC: 6.4 K/uL (ref 4.0–10.5)
nRBC: 0 % (ref 0.0–0.2)

## 2022-02-04 LAB — SURGICAL PCR SCREEN
MRSA, PCR: NEGATIVE
Staphylococcus aureus: NEGATIVE

## 2022-02-04 LAB — TYPE AND SCREEN
ABO/RH(D): A POS
Antibody Screen: NEGATIVE

## 2022-02-04 NOTE — Progress Notes (Signed)
PCP - Dr. Halina Maidens ?Cardiologist - denies ? ?PPM/ICD - denies ? ? ?Chest x-ray - 09/03/19 (DG Ribs with right chest) ?EKG - 08/04/21 ?Stress Test - 2004 ?ECHO - 03/02/2008 ?Cardiac Cath - 12/2007 ? ?Sleep Study - denies ? ? ?DM- denies ? ?ASA/Blood Thinner Instructions: n/a ? ? ?ERAS Protcol - no, NPO ? ? ?COVID TEST- n/a ? ? ?Anesthesia review: no ? ?Patient denies shortness of breath, fever, cough and chest pain at PAT appointment ? ? ?All instructions explained to the patient, with a verbal understanding of the material. Patient agrees to go over the instructions while at home for a better understanding. Patient also instructed to notify surgeon of any contact with COVID+ person or if she develops any symptoms. The opportunity to ask questions was provided. ?  ?

## 2022-02-08 NOTE — Anesthesia Preprocedure Evaluation (Addendum)
Anesthesia Evaluation  ?Patient identified by MRN, date of birth, ID band ?Patient awake ? ? ? ?Reviewed: ?Allergy & Precautions, NPO status , Patient's Chart, lab work & pertinent test results ? ?History of Anesthesia Complications ?Negative for: history of anesthetic complications ? ?Airway ?Mallampati: II ? ? ?Neck ROM: Full ? ? ? Dental ? ?(+) Edentulous Upper, Partial Lower, Dental Advisory Given ?  ?Pulmonary ?asthma , COPD,  COPD inhaler, former smoker,  ?  ?breath sounds clear to auscultation ? ? ? ? ? ? Cardiovascular ?hypertension, Pt. 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 ?Rhythm:Regular Rate:Normal ? ?'04 cath: 50% D1, normal LVF ?'09 ECHO: normal LVF, normal RVF, no significant valvular abnormalities ?  ?Neuro/Psych ?Anxiety Meniere's ?  ? GI/Hepatic ?Neg liver ROS, GERD  Medicated and Controlled,  ?Endo/Other  ?Morbid obesity ? Renal/GU ?negative Renal ROS  ? ?  ?Musculoskeletal ? ?(+) Arthritis , Osteoarthritis,   ? Abdominal ?(+) + obese,   ?Peds ? Hematology ?negative hematology ROS ?(+)   ?Anesthesia Other Findings ? ? Reproductive/Obstetrics ? ?  ? ? ? ? ? ? ? ? ? ? ? ? ? ?  ?  ? ? ? ? ? ? ?Anesthesia Physical ?Anesthesia Plan ? ?ASA: 3 ? ?Anesthesia Plan: General  ? ?Post-op Pain Management: Tylenol PO (pre-op)*  ? ?Induction: Intravenous ? ?PONV Risk Score and Plan: 3 and Ondansetron, Dexamethasone and Treatment may vary due to age or medical condition ? ?Airway Management Planned: Oral ETT ? ?Additional Equipment: None ? ?Intra-op Plan:  ? ?Post-operative Plan: Extubation in OR ? ?Informed Consent: I have reviewed the patients History and Physical, chart, labs and discussed the procedure including the risks, benefits and alternatives for the proposed anesthesia with the patient or authorized representative who has indicated his/her understanding and acceptance.  ? ? ? ?Dental advisory given ? ?Plan Discussed with: CRNA and Surgeon ? ?Anesthesia  Plan Comments:   ? ? ? ? ? ?Anesthesia Quick Evaluation ? ?

## 2022-02-09 ENCOUNTER — Encounter (HOSPITAL_COMMUNITY): Payer: Self-pay | Admitting: Neurosurgery

## 2022-02-09 ENCOUNTER — Other Ambulatory Visit: Payer: Self-pay

## 2022-02-09 ENCOUNTER — Inpatient Hospital Stay (HOSPITAL_COMMUNITY)
Admission: RE | Admit: 2022-02-09 | Discharge: 2022-02-10 | DRG: 457 | Disposition: A | Discharge status: Home / Self Care | Payer: Medicare Other | Source: Ambulatory Visit | Attending: Neurosurgery | Admitting: Neurosurgery

## 2022-02-09 ENCOUNTER — Inpatient Hospital Stay (HOSPITAL_COMMUNITY): Payer: Medicare Other | Admitting: Anesthesiology

## 2022-02-09 ENCOUNTER — Inpatient Hospital Stay (HOSPITAL_COMMUNITY): Payer: Medicare Other

## 2022-02-09 ENCOUNTER — Encounter (HOSPITAL_COMMUNITY)
Admission: RE | Disposition: A | Discharge status: Home / Self Care | Payer: Self-pay | Source: Ambulatory Visit | Attending: Neurosurgery

## 2022-02-09 DIAGNOSIS — M47896 Other spondylosis, lumbar region: Secondary | ICD-10-CM | POA: Diagnosis not present

## 2022-02-09 DIAGNOSIS — Z825 Family history of asthma and other chronic lower respiratory diseases: Secondary | ICD-10-CM | POA: Diagnosis not present

## 2022-02-09 DIAGNOSIS — Z96642 Presence of left artificial hip joint: Secondary | ICD-10-CM | POA: Diagnosis present

## 2022-02-09 DIAGNOSIS — M5136 Other intervertebral disc degeneration, lumbar region: Secondary | ICD-10-CM

## 2022-02-09 DIAGNOSIS — Z823 Family history of stroke: Secondary | ICD-10-CM | POA: Diagnosis not present

## 2022-02-09 DIAGNOSIS — M4156 Other secondary scoliosis, lumbar region: Secondary | ICD-10-CM | POA: Diagnosis not present

## 2022-02-09 DIAGNOSIS — Z96652 Presence of left artificial knee joint: Secondary | ICD-10-CM | POA: Diagnosis present

## 2022-02-09 DIAGNOSIS — Z981 Arthrodesis status: Secondary | ICD-10-CM | POA: Diagnosis not present

## 2022-02-09 DIAGNOSIS — Z8249 Family history of ischemic heart disease and other diseases of the circulatory system: Secondary | ICD-10-CM

## 2022-02-09 DIAGNOSIS — M48062 Spinal stenosis, lumbar region with neurogenic claudication: Secondary | ICD-10-CM

## 2022-02-09 DIAGNOSIS — M4186 Other forms of scoliosis, lumbar region: Secondary | ICD-10-CM | POA: Diagnosis not present

## 2022-02-09 DIAGNOSIS — I2510 Atherosclerotic heart disease of native coronary artery without angina pectoris: Secondary | ICD-10-CM

## 2022-02-09 DIAGNOSIS — K219 Gastro-esophageal reflux disease without esophagitis: Secondary | ICD-10-CM | POA: Diagnosis not present

## 2022-02-09 DIAGNOSIS — Z87891 Personal history of nicotine dependence: Secondary | ICD-10-CM | POA: Diagnosis not present

## 2022-02-09 DIAGNOSIS — Y831 Surgical operation with implant of artificial internal device as the cause of abnormal reaction of the patient, or of later complication, without mention of misadventure at the time of the procedure: Secondary | ICD-10-CM | POA: Diagnosis present

## 2022-02-09 DIAGNOSIS — Z882 Allergy status to sulfonamides status: Secondary | ICD-10-CM | POA: Diagnosis not present

## 2022-02-09 DIAGNOSIS — M5126 Other intervertebral disc displacement, lumbar region: Secondary | ICD-10-CM | POA: Diagnosis not present

## 2022-02-09 DIAGNOSIS — M4326 Fusion of spine, lumbar region: Secondary | ICD-10-CM | POA: Diagnosis not present

## 2022-02-09 DIAGNOSIS — M48061 Spinal stenosis, lumbar region without neurogenic claudication: Secondary | ICD-10-CM | POA: Diagnosis not present

## 2022-02-09 DIAGNOSIS — G9741 Accidental puncture or laceration of dura during a procedure: Secondary | ICD-10-CM | POA: Diagnosis not present

## 2022-02-09 DIAGNOSIS — I10 Essential (primary) hypertension: Secondary | ICD-10-CM | POA: Diagnosis present

## 2022-02-09 DIAGNOSIS — Z9889 Other specified postprocedural states: Secondary | ICD-10-CM | POA: Diagnosis not present

## 2022-02-09 SURGERY — POSTERIOR LUMBAR FUSION 2 LEVEL
Anesthesia: General | Site: Back

## 2022-02-09 MED ORDER — MECLIZINE HCL 12.5 MG PO TABS
12.5000 mg | ORAL_TABLET | Freq: Three times a day (TID) | ORAL | Status: DC | PRN
  Filled 2022-02-09: qty 1

## 2022-02-09 MED ORDER — PANTOPRAZOLE SODIUM 40 MG IV SOLR: 40.0000 mg | Freq: Every day | INTRAVENOUS | Status: DC

## 2022-02-09 MED ORDER — HYDROMORPHONE HCL 1 MG/ML IJ SOLN
INTRAMUSCULAR | Status: AC
  Filled 2022-02-09: qty 1

## 2022-02-09 MED ORDER — PROPOFOL 10 MG/ML IV BOLUS
INTRAVENOUS | Status: AC
  Filled 2022-02-09: qty 20

## 2022-02-09 MED ORDER — OXYCODONE HCL 5 MG PO TABS: 10.0000 mg | ORAL_TABLET | ORAL | Status: DC | PRN

## 2022-02-09 MED ORDER — ACETAMINOPHEN 325 MG PO TABS: 650.0000 mg | ORAL_TABLET | ORAL | Status: DC | PRN

## 2022-02-09 MED ORDER — THROMBIN 20000 UNITS EX SOLR
CUTANEOUS | Status: AC
  Filled 2022-02-09: qty 20000

## 2022-02-09 MED ORDER — CHLORHEXIDINE GLUCONATE CLOTH 2 % EX PADS: 6.0000 | MEDICATED_PAD | Freq: Once | CUTANEOUS | Status: DC

## 2022-02-09 MED ORDER — ALBUTEROL SULFATE HFA 108 (90 BASE) MCG/ACT IN AERS: 2.0000 | INHALATION_SPRAY | Freq: Four times a day (QID) | RESPIRATORY_TRACT | Status: DC | PRN

## 2022-02-09 MED ORDER — HEMOSTATIC AGENTS (NO CHARGE) OPTIME
TOPICAL | Status: DC | PRN
  Administered 2022-02-09 (×2): 1 via TOPICAL

## 2022-02-09 MED ORDER — ONDANSETRON HCL 4 MG/2ML IJ SOLN
INTRAMUSCULAR | Status: AC
  Filled 2022-02-09: qty 2

## 2022-02-09 MED ORDER — LIDOCAINE-EPINEPHRINE 1 %-1:100000 IJ SOLN
INTRAMUSCULAR | Status: AC
  Filled 2022-02-09: qty 1

## 2022-02-09 MED ORDER — CHLORHEXIDINE GLUCONATE 0.12 % MT SOLN
OROMUCOSAL | Status: AC
  Administered 2022-02-09: 15 mL via OROMUCOSAL
  Filled 2022-02-09: qty 15

## 2022-02-09 MED ORDER — HYDROCHLOROTHIAZIDE 25 MG PO TABS
25.0000 mg | ORAL_TABLET | Freq: Every morning | ORAL | Status: DC
Start: 2022-02-10 — End: 2022-02-10
  Administered 2022-02-10: 25 mg via ORAL
  Filled 2022-02-09: qty 1

## 2022-02-09 MED ORDER — PANTOPRAZOLE SODIUM 40 MG PO TBEC
40.0000 mg | DELAYED_RELEASE_TABLET | Freq: Every day | ORAL | Status: DC
Start: 2022-02-10 — End: 2022-02-10
  Administered 2022-02-09 – 2022-02-10 (×2): 40 mg via ORAL
  Filled 2022-02-09 (×2): qty 1

## 2022-02-09 MED ORDER — PHENYLEPHRINE HCL-NACL 20-0.9 MG/250ML-% IV SOLN
INTRAVENOUS | Status: DC | PRN
  Administered 2022-02-09: 25 ug/min via INTRAVENOUS

## 2022-02-09 MED ORDER — VITAMIN D 25 MCG (1000 UNIT) PO TABS
1000.0000 [IU] | ORAL_TABLET | Freq: Every day | ORAL | Status: DC
  Administered 2022-02-09: 1000 [IU] via ORAL
  Filled 2022-02-09: qty 1

## 2022-02-09 MED ORDER — FENTANYL CITRATE (PF) 250 MCG/5ML IJ SOLN
INTRAMUSCULAR | Status: DC | PRN
  Administered 2022-02-09: 150 ug via INTRAVENOUS
  Administered 2022-02-09 (×2): 50 ug via INTRAVENOUS

## 2022-02-09 MED ORDER — THROMBIN (RECOMBINANT) 20000 UNITS EX SOLR
CUTANEOUS | Status: AC
  Filled 2022-02-09: qty 20000

## 2022-02-09 MED ORDER — SUGAMMADEX SODIUM 200 MG/2ML IV SOLN
INTRAVENOUS | Status: DC | PRN
  Administered 2022-02-09: 200 mg via INTRAVENOUS

## 2022-02-09 MED ORDER — CYCLOBENZAPRINE HCL 10 MG PO TABS
10.0000 mg | ORAL_TABLET | Freq: Three times a day (TID) | ORAL | Status: DC | PRN
  Administered 2022-02-09 – 2022-02-10 (×2): 10 mg via ORAL
  Filled 2022-02-09 (×2): qty 1

## 2022-02-09 MED ORDER — SODIUM CHLORIDE 0.9 % IV SOLN
250.0000 mL | INTRAVENOUS | Status: DC
  Administered 2022-02-09: 250 mL via INTRAVENOUS

## 2022-02-09 MED ORDER — ACETAMINOPHEN 500 MG PO TABS
ORAL_TABLET | ORAL | Status: AC
Start: 2022-02-09 — End: 2022-02-09
  Administered 2022-02-09: 1000 mg via ORAL
  Filled 2022-02-09: qty 2

## 2022-02-09 MED ORDER — PHENOL 1.4 % MT LIQD: 1.0000 | OROMUCOSAL | Status: DC | PRN

## 2022-02-09 MED ORDER — GABAPENTIN 300 MG PO CAPS
300.0000 mg | ORAL_CAPSULE | Freq: Every day | ORAL | Status: DC
  Administered 2022-02-09: 300 mg via ORAL
  Filled 2022-02-09: qty 1

## 2022-02-09 MED ORDER — MEPERIDINE HCL 25 MG/ML IJ SOLN: 6.2500 mg | INTRAMUSCULAR | Status: DC | PRN

## 2022-02-09 MED ORDER — ALBUTEROL SULFATE (2.5 MG/3ML) 0.083% IN NEBU: 2.5000 mg | INHALATION_SOLUTION | Freq: Four times a day (QID) | RESPIRATORY_TRACT | Status: DC | PRN

## 2022-02-09 MED ORDER — ALUM HYDROXIDE-MAG TRISILICATE 80-20 MG PO CHEW: 1.0000 | CHEWABLE_TABLET | Freq: Every day | ORAL | Status: DC | PRN

## 2022-02-09 MED ORDER — CEFAZOLIN SODIUM-DEXTROSE 2-4 GM/100ML-% IV SOLN
2.0000 g | Freq: Three times a day (TID) | INTRAVENOUS | Status: AC
  Administered 2022-02-09 – 2022-02-10 (×2): 2 g via INTRAVENOUS
  Filled 2022-02-09 (×2): qty 100

## 2022-02-09 MED ORDER — PHENYLEPHRINE 80 MCG/ML (10ML) SYRINGE FOR IV PUSH (FOR BLOOD PRESSURE SUPPORT)
PREFILLED_SYRINGE | INTRAVENOUS | Status: DC | PRN
  Administered 2022-02-09: 160 ug via INTRAVENOUS

## 2022-02-09 MED ORDER — ONDANSETRON HCL 4 MG/2ML IJ SOLN
4.0000 mg | Freq: Four times a day (QID) | INTRAMUSCULAR | Status: DC | PRN
  Administered 2022-02-09: 4 mg via INTRAVENOUS
  Filled 2022-02-09: qty 2

## 2022-02-09 MED ORDER — ZOLPIDEM TARTRATE 5 MG PO TABS
5.0000 mg | ORAL_TABLET | Freq: Every day | ORAL | Status: DC
Start: 2022-02-09 — End: 2022-02-10
  Administered 2022-02-09: 5 mg via ORAL
  Filled 2022-02-09: qty 1

## 2022-02-09 MED ORDER — ONDANSETRON HCL 4 MG PO TABS: 4.0000 mg | ORAL_TABLET | Freq: Four times a day (QID) | ORAL | Status: DC | PRN

## 2022-02-09 MED ORDER — PROPOFOL 10 MG/ML IV BOLUS
INTRAVENOUS | Status: DC | PRN
  Administered 2022-02-09: 30 mg via INTRAVENOUS
  Administered 2022-02-09: 160 mg via INTRAVENOUS
  Administered 2022-02-09: 40 mg via INTRAVENOUS
  Administered 2022-02-09 (×2): 30 mg via INTRAVENOUS

## 2022-02-09 MED ORDER — 0.9 % SODIUM CHLORIDE (POUR BTL) OPTIME
TOPICAL | Status: DC | PRN
Start: 2022-02-09 — End: 2022-02-09
  Administered 2022-02-09 (×2): 1000 mL

## 2022-02-09 MED ORDER — SODIUM CHLORIDE 0.9% FLUSH
3.0000 mL | Freq: Two times a day (BID) | INTRAVENOUS | Status: DC
  Administered 2022-02-10: 3 mL via INTRAVENOUS

## 2022-02-09 MED ORDER — LIDOCAINE 2% (20 MG/ML) 5 ML SYRINGE
INTRAMUSCULAR | Status: DC | PRN
  Administered 2022-02-09: 40 mg via INTRAVENOUS
  Administered 2022-02-09: 60 mg via INTRAVENOUS

## 2022-02-09 MED ORDER — OXYCODONE HCL 5 MG PO TABS
ORAL_TABLET | ORAL | Status: AC
  Filled 2022-02-09: qty 1

## 2022-02-09 MED ORDER — PHENYLEPHRINE 80 MCG/ML (10ML) SYRINGE FOR IV PUSH (FOR BLOOD PRESSURE SUPPORT)
PREFILLED_SYRINGE | INTRAVENOUS | Status: AC
  Filled 2022-02-09: qty 10

## 2022-02-09 MED ORDER — ORAL CARE MOUTH RINSE: 15.0000 mL | Freq: Once | OROMUCOSAL | Status: AC

## 2022-02-09 MED ORDER — DEXMEDETOMIDINE (PRECEDEX) IN NS 20 MCG/5ML (4 MCG/ML) IV SYRINGE
PREFILLED_SYRINGE | INTRAVENOUS | Status: DC | PRN
  Administered 2022-02-09: 8 ug via INTRAVENOUS

## 2022-02-09 MED ORDER — ACETAMINOPHEN-CODEINE #3 300-30 MG PO TABS
1.0000 | ORAL_TABLET | ORAL | Status: DC | PRN
  Administered 2022-02-09 – 2022-02-10 (×6): 2 via ORAL
  Filled 2022-02-09 (×6): qty 2

## 2022-02-09 MED ORDER — TRAMADOL HCL 50 MG PO TABS: 50.0000 mg | ORAL_TABLET | Freq: Four times a day (QID) | ORAL | Status: DC | PRN

## 2022-02-09 MED ORDER — ROCURONIUM BROMIDE 10 MG/ML (PF) SYRINGE
PREFILLED_SYRINGE | INTRAVENOUS | Status: DC | PRN
  Administered 2022-02-09: 40 mg via INTRAVENOUS
  Administered 2022-02-09: 60 mg via INTRAVENOUS

## 2022-02-09 MED ORDER — SODIUM CHLORIDE 0.9% FLUSH: 3.0000 mL | INTRAVENOUS | Status: DC | PRN

## 2022-02-09 MED ORDER — MIDAZOLAM HCL 2 MG/2ML IJ SOLN: 0.5000 mg | Freq: Once | INTRAMUSCULAR | Status: DC | PRN

## 2022-02-09 MED ORDER — CHLORHEXIDINE GLUCONATE 0.12 % MT SOLN: 15.0000 mL | Freq: Once | OROMUCOSAL | Status: AC

## 2022-02-09 MED ORDER — BUPIVACAINE LIPOSOME 1.3 % IJ SUSP
INTRAMUSCULAR | Status: DC | PRN
Start: 2022-02-09 — End: 2022-02-09
  Administered 2022-02-09: 20 mL

## 2022-02-09 MED ORDER — DEXAMETHASONE SODIUM PHOSPHATE 10 MG/ML IJ SOLN
INTRAMUSCULAR | Status: AC
  Filled 2022-02-09: qty 1

## 2022-02-09 MED ORDER — DEXAMETHASONE SODIUM PHOSPHATE 10 MG/ML IJ SOLN
INTRAMUSCULAR | Status: DC | PRN
  Administered 2022-02-09: 10 mg via INTRAVENOUS

## 2022-02-09 MED ORDER — ROCURONIUM BROMIDE 10 MG/ML (PF) SYRINGE
PREFILLED_SYRINGE | INTRAVENOUS | Status: AC
  Filled 2022-02-09: qty 10

## 2022-02-09 MED ORDER — FENTANYL CITRATE (PF) 250 MCG/5ML IJ SOLN
INTRAMUSCULAR | Status: AC
  Filled 2022-02-09: qty 5

## 2022-02-09 MED ORDER — ACETAMINOPHEN 500 MG PO TABS: 1000.0000 mg | ORAL_TABLET | Freq: Once | ORAL | Status: AC

## 2022-02-09 MED ORDER — OXYCODONE HCL 5 MG PO TABS: 5.0000 mg | ORAL_TABLET | Freq: Once | ORAL | Status: DC | PRN

## 2022-02-09 MED ORDER — THROMBIN (RECOMBINANT) 20000 UNITS EX SOLR
CUTANEOUS | Status: DC | PRN
  Administered 2022-02-09: 20000 [IU] via TOPICAL

## 2022-02-09 MED ORDER — ACETAMINOPHEN 500 MG PO TABS: 1000.0000 mg | ORAL_TABLET | Freq: Four times a day (QID) | ORAL | Status: DC | PRN

## 2022-02-09 MED ORDER — HYDROMORPHONE HCL 1 MG/ML IJ SOLN
0.2500 mg | INTRAMUSCULAR | Status: DC | PRN
  Administered 2022-02-09 (×4): 0.5 mg via INTRAVENOUS

## 2022-02-09 MED ORDER — PROBIOTIC DAILY PO CAPS
ORAL_CAPSULE | Freq: Every day | ORAL | Status: DC
Start: 2022-02-09 — End: 2022-02-09

## 2022-02-09 MED ORDER — MELOXICAM 7.5 MG PO TABS
7.5000 mg | ORAL_TABLET | Freq: Two times a day (BID) | ORAL | Status: DC
  Administered 2022-02-09 – 2022-02-10 (×2): 7.5 mg via ORAL
  Filled 2022-02-09 (×3): qty 1

## 2022-02-09 MED ORDER — CEFAZOLIN SODIUM-DEXTROSE 2-4 GM/100ML-% IV SOLN
2.0000 g | INTRAVENOUS | Status: AC
  Administered 2022-02-09: 2 g via INTRAVENOUS

## 2022-02-09 MED ORDER — CLONAZEPAM 0.5 MG PO TABS: 0.5000 mg | ORAL_TABLET | Freq: Every evening | ORAL | Status: DC | PRN

## 2022-02-09 MED ORDER — BUPIVACAINE LIPOSOME 1.3 % IJ SUSP
INTRAMUSCULAR | Status: AC
  Filled 2022-02-09: qty 20

## 2022-02-09 MED ORDER — HYDROMORPHONE HCL 1 MG/ML IJ SOLN: 0.5000 mg | INTRAMUSCULAR | Status: DC | PRN

## 2022-02-09 MED ORDER — BUPIVACAINE HCL (PF) 0.25 % IJ SOLN
INTRAMUSCULAR | Status: AC
  Filled 2022-02-09: qty 30

## 2022-02-09 MED ORDER — EPHEDRINE SULFATE-NACL 50-0.9 MG/10ML-% IV SOSY
PREFILLED_SYRINGE | INTRAVENOUS | Status: DC | PRN
  Administered 2022-02-09: 4 mg via INTRAVENOUS

## 2022-02-09 MED ORDER — CEFAZOLIN SODIUM-DEXTROSE 2-4 GM/100ML-% IV SOLN
INTRAVENOUS | Status: AC
  Filled 2022-02-09: qty 100

## 2022-02-09 MED ORDER — ACETAMINOPHEN 650 MG RE SUPP: 650.0000 mg | RECTAL | Status: DC | PRN

## 2022-02-09 MED ORDER — LISINOPRIL 20 MG PO TABS
40.0000 mg | ORAL_TABLET | Freq: Every day | ORAL | Status: DC
  Administered 2022-02-10: 40 mg via ORAL
  Filled 2022-02-09: qty 2

## 2022-02-09 MED ORDER — ALUM & MAG HYDROXIDE-SIMETH 200-200-20 MG/5ML PO SUSP: 30.0000 mL | Freq: Four times a day (QID) | ORAL | Status: DC | PRN

## 2022-02-09 MED ORDER — AMLODIPINE BESYLATE 5 MG PO TABS
5.0000 mg | ORAL_TABLET | ORAL | Status: DC
  Administered 2022-02-10: 5 mg via ORAL
  Filled 2022-02-09: qty 1

## 2022-02-09 MED ORDER — MENTHOL 3 MG MT LOZG: 1.0000 | LOZENGE | OROMUCOSAL | Status: DC | PRN

## 2022-02-09 MED ORDER — OXYCODONE HCL 5 MG/5ML PO SOLN: 5.0000 mg | Freq: Once | ORAL | Status: DC | PRN

## 2022-02-09 MED ORDER — RISAQUAD PO CAPS
1.0000 | ORAL_CAPSULE | Freq: Every day | ORAL | Status: DC
  Administered 2022-02-10: 1 via ORAL
  Filled 2022-02-09: qty 1

## 2022-02-09 MED ORDER — ADULT MULTIVITAMIN W/MINERALS CH
1.0000 | ORAL_TABLET | ORAL | Status: DC
  Administered 2022-02-10: 1 via ORAL
  Filled 2022-02-09: qty 1

## 2022-02-09 MED ORDER — FOSINOPRIL SODIUM 20 MG PO TABS
40.0000 mg | ORAL_TABLET | Freq: Every day | ORAL | Status: DC
  Filled 2022-02-09: qty 2

## 2022-02-09 MED ORDER — LIDOCAINE-EPINEPHRINE 1 %-1:100000 IJ SOLN
INTRAMUSCULAR | Status: DC | PRN
  Administered 2022-02-09: 10 mL

## 2022-02-09 MED ORDER — LIDOCAINE 2% (20 MG/ML) 5 ML SYRINGE
INTRAMUSCULAR | Status: AC
  Filled 2022-02-09: qty 5

## 2022-02-09 MED ORDER — LACTATED RINGERS IV SOLN: INTRAVENOUS | Status: DC

## 2022-02-09 SURGICAL SUPPLY — 85 items
BAG COUNTER SPONGE SURGICOUNT (BAG) ×3 IMPLANT
BASKET BONE COLLECTION (BASKET) ×2 IMPLANT
BENZOIN TINCTURE PRP APPL 2/3 (GAUZE/BANDAGES/DRESSINGS) ×2 IMPLANT
BIT DRILL 5.0/4.0 (BIT) IMPLANT
BLADE BONE MILL MEDIUM (MISCELLANEOUS) ×1 IMPLANT
BLADE CLIPPER SURG (BLADE) IMPLANT
BLADE SURG 11 STRL SS (BLADE) ×2 IMPLANT
BONE VIVIGEN FORMABLE 5.4CC (Bone Implant) ×2 IMPLANT
BUR CUTTER 7.0 ROUND (BURR) ×2 IMPLANT
BUR MATCHSTICK NEURO 3.0 LAGG (BURR) ×2 IMPLANT
CANISTER SUCT 3000ML PPV (MISCELLANEOUS) ×2 IMPLANT
CAP LOCKING THREADED (Cap) ×6 IMPLANT
CARTRIDGE OIL MAESTRO DRILL (MISCELLANEOUS) ×1 IMPLANT
CNTNR URN SCR LID CUP LEK RST (MISCELLANEOUS) ×1 IMPLANT
CONT SPEC 4OZ STRL OR WHT (MISCELLANEOUS) ×2
COVER BACK TABLE 60X90IN (DRAPES) ×2 IMPLANT
DECANTER SPIKE VIAL GLASS SM (MISCELLANEOUS) ×1 IMPLANT
DERMABOND ADVANCED (GAUZE/BANDAGES/DRESSINGS) ×1
DERMABOND ADVANCED .7 DNX12 (GAUZE/BANDAGES/DRESSINGS) ×1 IMPLANT
DIFFUSER DRILL AIR PNEUMATIC (MISCELLANEOUS) ×2 IMPLANT
DRAPE C-ARM 42X72 X-RAY (DRAPES) ×2 IMPLANT
DRAPE C-ARMOR (DRAPES) ×1 IMPLANT
DRAPE HALF SHEET 40X57 (DRAPES) IMPLANT
DRAPE LAPAROTOMY 100X72X124 (DRAPES) ×2 IMPLANT
DRAPE SURG 17X23 STRL (DRAPES) ×2 IMPLANT
DRILL 5.0/4.0 (BIT) ×2
DRSG OPSITE 4X5.5 SM (GAUZE/BANDAGES/DRESSINGS) ×1 IMPLANT
DRSG OPSITE POSTOP 4X6 (GAUZE/BANDAGES/DRESSINGS) ×1 IMPLANT
DRSG OPSITE POSTOP 4X8 (GAUZE/BANDAGES/DRESSINGS) ×1 IMPLANT
DURAPREP 26ML APPLICATOR (WOUND CARE) ×2 IMPLANT
ELECT REM PT RETURN 9FT ADLT (ELECTROSURGICAL) ×2
ELECTRODE REM PT RTRN 9FT ADLT (ELECTROSURGICAL) ×1 IMPLANT
EVACUATOR 3/16  PVC DRAIN (DRAIN)
EVACUATOR 3/16 PVC DRAIN (DRAIN) ×1 IMPLANT
GAUZE 4X4 16PLY ~~LOC~~+RFID DBL (SPONGE) ×1 IMPLANT
GAUZE SPONGE 4X4 12PLY STRL (GAUZE/BANDAGES/DRESSINGS) ×2 IMPLANT
GLOVE BIO SURGEON STRL SZ7 (GLOVE) ×1 IMPLANT
GLOVE BIO SURGEON STRL SZ8 (GLOVE) ×4 IMPLANT
GLOVE BIOGEL PI IND STRL 7.0 (GLOVE) IMPLANT
GLOVE BIOGEL PI INDICATOR 7.0 (GLOVE) ×1
GLOVE EXAM NITRILE XL STR (GLOVE) IMPLANT
GLOVE INDICATOR 8.5 STRL (GLOVE) ×4 IMPLANT
GLOVE SURG SS PI 6.5 STRL IVOR (GLOVE) ×4 IMPLANT
GLOVE SURG UNDER POLY LF SZ8.5 (GLOVE) ×4 IMPLANT
GOWN STRL REUS W/ TWL LRG LVL3 (GOWN DISPOSABLE) IMPLANT
GOWN STRL REUS W/ TWL XL LVL3 (GOWN DISPOSABLE) ×2 IMPLANT
GOWN STRL REUS W/TWL 2XL LVL3 (GOWN DISPOSABLE) IMPLANT
GOWN STRL REUS W/TWL LRG LVL3 (GOWN DISPOSABLE) ×2
GOWN STRL REUS W/TWL XL LVL3 (GOWN DISPOSABLE) ×2
GRAFT BNE MATRIX VG FRMBL MD 5 (Bone Implant) IMPLANT
GRAFT BONE PROTEIOS LRG 5CC (Orthopedic Implant) ×1 IMPLANT
GRAFT DURAGEN MATRIX 1WX1L (Tissue) ×1 IMPLANT
KIT BASIN OR (CUSTOM PROCEDURE TRAY) ×2 IMPLANT
KIT GRAFTMAG DEL NEURO DISP (NEUROSURGERY SUPPLIES) IMPLANT
KIT POSITION SURG JACKSON T1 (MISCELLANEOUS) ×2 IMPLANT
KIT TURNOVER KIT B (KITS) ×2 IMPLANT
MILL BONE PREP (MISCELLANEOUS) ×2 IMPLANT
NDL HYPO 21X1.5 SAFETY (NEEDLE) ×1 IMPLANT
NDL HYPO 25X1 1.5 SAFETY (NEEDLE) ×1 IMPLANT
NEEDLE HYPO 21X1.5 SAFETY (NEEDLE) ×2 IMPLANT
NEEDLE HYPO 25X1 1.5 SAFETY (NEEDLE) ×2 IMPLANT
NS IRRIG 1000ML POUR BTL (IV SOLUTION) ×3 IMPLANT
OIL CARTRIDGE MAESTRO DRILL (MISCELLANEOUS) ×2
PACK LAMINECTOMY NEURO (CUSTOM PROCEDURE TRAY) ×2 IMPLANT
PAD ARMBOARD 7.5X6 YLW CONV (MISCELLANEOUS) ×6 IMPLANT
ROD 65MM SPINAL (Rod) ×2 IMPLANT
ROD SPNL 5.5 CREO TI 65 (Rod) IMPLANT
SCREW PA THRD CREO TULIP 5.5X4 (Head) ×6 IMPLANT
SEALANT ADHERUS EXTEND TIP (MISCELLANEOUS) ×1 IMPLANT
SHAFT CREO 30MM (Neuro Prosthesis/Implant) ×6 IMPLANT
SPACER SUSTAIN RT 12 15D 9X26 (Spacer) ×2 IMPLANT
SPACER SUSTAIN RT 12 8D 9X26 (Spacer) ×2 IMPLANT
SPONGE SURGIFOAM ABS GEL 100 (HEMOSTASIS) ×2 IMPLANT
SPONGE T-LAP 4X18 ~~LOC~~+RFID (SPONGE) IMPLANT
STRIP CLOSURE SKIN 1/2X4 (GAUZE/BANDAGES/DRESSINGS) ×3 IMPLANT
SUT VIC AB 0 CT1 18XCR BRD8 (SUTURE) ×1 IMPLANT
SUT VIC AB 0 CT1 8-18 (SUTURE) ×1
SUT VIC AB 2-0 CT1 18 (SUTURE) ×2 IMPLANT
SUT VIC AB 4-0 PS2 27 (SUTURE) ×2 IMPLANT
SYR 20ML LL LF (SYRINGE) ×2 IMPLANT
TOWEL GREEN STERILE (TOWEL DISPOSABLE) ×2 IMPLANT
TOWEL GREEN STERILE FF (TOWEL DISPOSABLE) ×2 IMPLANT
TRAY FOLEY MTR SLVR 14FR STAT (SET/KITS/TRAYS/PACK) ×1 IMPLANT
TRAY FOLEY MTR SLVR 16FR STAT (SET/KITS/TRAYS/PACK) ×1 IMPLANT
WATER STERILE IRR 1000ML POUR (IV SOLUTION) ×2 IMPLANT

## 2022-02-09 NOTE — Transfer of Care (Signed)
Immediate Anesthesia Transfer of Care Note ? ?Patient: Katrina Poole ? ?Procedure(s) Performed: Posterior Lumbar Interbody Fusion  - Lumbar three-Lumbar four - Lumbar four-Lumbar five (Back) ? ?Patient Location: PACU ? ?Anesthesia Type:General ? ?Level of Consciousness: awake, alert , drowsy and patient cooperative ? ?Airway & Oxygen Therapy: Patient Spontanous Breathing and Patient connected to face mask oxygen ? ?Post-op Assessment: Report given to RN, Post -op Vital signs reviewed and stable and Patient moving all extremities X 4 ? ?Post vital signs: Reviewed and stable ? ?Last Vitals:  ?Vitals Value Taken Time  ?BP    ?Temp    ?Pulse 76 02/09/22 1337  ?Resp    ?SpO2 94 % 02/09/22 1337  ?Vitals shown include unvalidated device data. ? ?Last Pain:  ?Vitals:  ? 02/09/22 0831  ?PainSc: 4   ?   ? ?Patients Stated Pain Goal: 0 (02/09/22 0831) ? ?Complications: No notable events documented. ?

## 2022-02-09 NOTE — Plan of Care (Signed)

## 2022-02-09 NOTE — Op Note (Signed)
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 ?Postoperative diagnosis: Same ? ?Procedure: #1 redo decompressive lumbar laminectomies L3-4 L4-5 with complete medial facetectomies radical foraminotomies in excess and requiring more work than would be needed with a standard interbody fusion. ? ?2.  Posterior lumbar interbody fusions L3-4 L4-5 utilizing the globus insert and rotate titanium cages packed with locally harvested autograft mixed with Vivigen and Proteus ? ?3.  Cortical screw fixation utilizing the Creo amp modular cortical screw set L3-L5 ? ?Surgeon: Jillyn Hidden Nazeer Romney ? ?Assistant: Julien Girt ? ?Anesthesia: General ? ?EBL: Minimal ? ?HPI: 76 year old female with longstanding back and right leg pain previously undergone laminotomies at L3-4 and L4-5 on the right patient developed gross worsening back and right leg pain work-up revealed progressive degenerative collapse degenerative scoliosis severe spinal stenosis and recurrent herniated nucleus pulposus at both levels.  Patient due to failed conservative treatment imaging findings and progression of clinical syndrome I recommended redo decompressive laminectomies and interbody fusions.  I extensively reviewed the risks and benefits of the operation with her as well as perioperative course expectations of outcome and alternatives of surgery and she understands and agrees to proceed forward. ? ?Operative procedure: Patient was brought into the OR was induced under general anesthesia positioned prone on Jackson table her back was prepped and draped in routine sterile fashion.  Roll incision was opened up and extended cephalad caudally scar tissue was dissected free and subperiosteal dissection carried lamina of L to L3-L4 and L5 bilaterally.  Facet joints were then drilled down after interoperative x-ray confirmed identification appropriate level spinous  processes at L4 and L5 were removed central decompression was begun and then first working decompressing the left side complete medial facetectomies erotic foraminotomies were performed of the L3, L4, and L5 nerve root.  Then the scar tissue was freed up and complete medial facetectomies were performed at 3 4 and 4 5 with foraminotomies of the 3 4 and 5 root on the patient's right side.  Disc base was incised first working at L4-5 recurrent disc was noted and was markedly collapsed on the right but this was all cleaned up the disc base was opened up with sequential distraction I inserted 9 to 12 mm 8 degree 26 mm cages after adequate discectomy and endplate preparation with an extensive mount of autograft packed centrally.  Fluoroscopy confirmed good position of the implants while we are working and the 3 4 disc base there was an annular rent on the inferior ventral aspect of the dura that was not reparable.  But this did not stop all CSF egress with packing.  We finished out the discectomy and cleaned out the recurrent disc and prepared the endplates and utilized 8 to 1115 degree implants at L3-4 with an extensive mount of autograft packed centrally.  We then inserted all 6 cortical screws under fluoroscopy all screws had excellent purchase the L5 screw definitely was ultimately felt good but bone was somewhat soft.  But with the dual diameter screws felt like it anchored well and there.  They were reinspected all the foramina to confirm patency inspected the dural defect packed DuraGen along the undersurface of the dura inject injected the green glue throughout and packed Gelfoam on top and some more green glue.  We then assembled the heads of the screws compressed L4 against L5 and L3 against L4 anchored everything in place.  Meticulous hemostasis was maintained the wound was copiously irrigated  I then closed the wound in layers and after the fascia was tightly closed Exparel was injected in the fascia and the  skin was closed running 4 subcuticular Dermabond benzoin Steri-Strips and a sterile dressing was applied patient recovery in stable condition.  At the end the case all needle counts and sponge counts were correct. ?

## 2022-02-09 NOTE — Anesthesia Postprocedure Evaluation (Signed)
Anesthesia Post Note ? ?Patient: Bryar Rennie Viets ? ?Procedure(s) Performed: Posterior Lumbar Interbody Fusion  - Lumbar three-Lumbar four - Lumbar four-Lumbar five (Back) ? ?  ? ?Patient location during evaluation: PACU ?Anesthesia Type: General ?Level of consciousness: awake and alert, patient cooperative and oriented ?Pain management: pain level controlled ?Vital Signs Assessment: post-procedure vital signs reviewed and stable ?Respiratory status: spontaneous breathing, nonlabored ventilation and respiratory function stable ?Cardiovascular status: blood pressure returned to baseline and stable ?Postop Assessment: no apparent nausea or vomiting and adequate PO intake ?Anesthetic complications: no ? ? ?No notable events documented. ? ?Last Vitals:  ?Vitals:  ? 02/09/22 1440 02/09/22 1510  ?BP: 122/67 125/65  ?Pulse: 84 71  ?Resp: 14 12  ?Temp: 36.6 ?C   ?SpO2: 96% 98%  ?  ?Last Pain:  ?Vitals:  ? 02/09/22 1440  ?PainSc: 4   ? ? ?  ?  ?  ?  ?  ?  ? ?Macey Wurtz,E. Terina Mcelhinny ? ? ? ? ?

## 2022-02-09 NOTE — Anesthesia Procedure Notes (Signed)
Procedure Name: Intubation ?Date/Time: 02/09/2022 9:54 AM ?Performed by: Rande Brunt, CRNA ?Pre-anesthesia Checklist: Patient identified, Patient being monitored, Timeout performed, Emergency Drugs available and Suction available ?Patient Re-evaluated:Patient Re-evaluated prior to induction ?Oxygen Delivery Method: Circle system utilized ?Preoxygenation: Pre-oxygenation with 100% oxygen ?Induction Type: IV induction ?Ventilation: Mask ventilation without difficulty ?Laryngoscope Size: Mac and 3 ?Grade View: Grade I ?Tube type: Oral ?Tube size: 7.0 mm ?Number of attempts: 1 ?Airway Equipment and Method: Stylet ?Placement Confirmation: ETT inserted through vocal cords under direct vision, positive ETCO2 and breath sounds checked- equal and bilateral ?Secured at: 22 cm ?Tube secured with: Tape ?Dental Injury: Teeth and Oropharynx as per pre-operative assessment  ? ? ? ? ?

## 2022-02-09 NOTE — H&P (Signed)
Katrina Poole is an 76 y.o. female.   ?Chief Complaint: Back right leg pain ?HPI: 76 year old female with longstanding back and right leg pain previously undergone laminotomy but has had progressive recurrence back and right leg pain work-up is revealed progressive spondylosis degenerative collapse spinal stenosis and recurrent herniated nucleus pulposus at L3-4 and L4-5 on the right.  So we recommended redo decompressive laminectomies at L3-4 and L4-5 on the right with and interbody fusions at those levels.  I extensively reviewed the risks and benefits of that procedure with her as well as perioperative course expectations of outcome and alternatives of surgery and she understood and agreed to proceed forward. ? ?Past Medical History:  ?Diagnosis Date  ? Arthritis   ? osteo of left hip  ? Asthma   ? humidity and exercise during summer  ? GERD (gastroesophageal reflux disease)   ? Hypertension   ? controlled on meds  ? Meniere syndrome   ? hearing deaf right ear  ? Neuromuscular disorder (HCC)   ? finger tips go numb, neck vertebrae  ? Restless leg syndrome   ? Vertigo   ? Wears dentures   ? full dentures on top, partial bottom  ? ? ?Past Surgical History:  ?Procedure Laterality Date  ? ANTERIOR CERVICAL DECOMP/DISCECTOMY FUSION N/A 03/16/2018  ? Procedure: Anterior Cervical Discectomy Fusion - Cervical Three- Cervical Four, Cervical Four- Cervical Five, Cervical Five-Cervical Six, Cervical Six- Cerival Seven;  Surgeon: Donalee Citrinram, Reann Dobias, MD;  [facility];  Service: Neurosurgery;  Laterality: N/A;  Anterior Cervical Discectomy Fusion - Cervical Three- Cervical Four, Cervical Four- Cervical Five, Cervical Five-Cervical Six, Cervical Six- Ceriva  ? CARDIAC CATHETERIZATION    ? x2 both normal  ? CARPAL TUNNEL RELEASE    ? COLONOSCOPY    ? COLONOSCOPY WITH PROPOFOL N/A 06/04/2016  ? Procedure: COLONOSCOPY WITH PROPOFOL;  Surgeon: Christena DeemMartin U Skulskie, MD;  [facility];  Service: Endoscopy;  Laterality: N/A;  ?  DILATION AND CURETTAGE OF UTERUS    ? ECTOPIC PREGNANCY SURGERY    ? ESOPHAGOGASTRODUODENOSCOPY (EGD) WITH PROPOFOL N/A 06/04/2016  ? Procedure: ESOPHAGOGASTRODUODENOSCOPY (EGD) WITH PROPOFOL;  Surgeon: Christena DeemMartin U Skulskie, MD;  [facility];  Service: Endoscopy;  Laterality: N/A;  ? exploratory laparoscopy    ? infertility surgery  ? HALLUX VALGUS CORRECTION Bilateral   ? JOINT REPLACEMENT Bilateral   ? partial knee  ? KNEE ARTHROPLASTY Left 01/27/2021  ? Procedure: CONVERSION OF LEFT PARTIAL KNEE TO LEFT TOTAL KNEE.;  Surgeon: Donato HeinzHooten, James P, MD;  Location: [facility] ORS;  Service: Orthopedics;  Laterality: Left;  ? KNEE ARTHROSCOPY Left 07/19/2015  ? Procedure: Arthroscopic limited synovectomy along with lateral compartment and retropatellar chondral debridement plus removal of several small loose bodies;  Surgeon: Erin SonsHarold Kernodle, MD;  [facility];  Service: Orthopedics;  Laterality: Left;  1ST CASE PER CECE  ? LUMBAR LAMINECTOMY/DECOMPRESSION MICRODISCECTOMY Right 04/29/2020  ? Procedure: Microdiscectomy - Lumbar Three-Lumbar Four - Lumbar Four-Lumbar Five - right;  Surgeon: Donalee Citrinram, Sameera Betton, MD;  [facility];  Service: Neurosurgery;  Laterality: Right;  Microdiscectomy - Lumbar Three-Lumbar Four - Lumbar Four-Lumbar Five - right  ? MEDIAL PARTIAL KNEE REPLACEMENT Bilateral   ? SHOULDER SURGERY Right   ? TOTAL HIP ARTHROPLASTY Left 11/25/2015  ? Procedure: TOTAL HIP ARTHROPLASTY;  Surgeon: Donato HeinzJames P Hooten, MD;  Location: [facility] ORS;  Service: Orthopedics;  Laterality: Left;  ? TRIGGER FINGER RELEASE Bilateral   ? ? ?Family History  ?Problem Relation Age of Onset  ?  Heart failure Mother   ? COPD Mother   ? Hypertension Mother   ? Cancer Father   ? Stroke Brother   ? ?Social History:  reports that she quit smoking about 31 years ago. Her smoking use included cigarettes. She has a 45.00 pack-year smoking history. She has never used smokeless tobacco. She reports current alcohol use of about 1.0  standard drink per week. She reports that she does not use drugs. ? ?Allergies:  ?Allergies  ?Allergen Reactions  ? Sulfa Antibiotics Other (See Comments)  ?  Hallucinations  ? Zithromax [Azithromycin] Hives  ? Oxycodone Other (See Comments)  ?  Makes head foggy and interferes with Meniere's syndrome.    ? Beef-Derived Products Other (See Comments)  ?  Tears up my stomach.  ? ? ?Medications Prior to Admission  ?Medication Sig Dispense Refill  ? acetaminophen (TYLENOL) 500 MG tablet Take 2 tablets (1,000 mg total) by mouth every 6 (six) hours as needed (DO NOT EXCEED 4000 MG DAILY FROM ALL SOURCES). 30 tablet 2  ? albuterol (VENTOLIN HFA) 108 (90 Base) MCG/ACT inhaler Inhale 2 puffs into the lungs every 6 (six) hours as needed for wheezing or shortness of breath. 18 g 1  ? amLODipine (NORVASC) 5 MG tablet Take 1 tablet (5 mg total) by mouth every morning. 90 tablet 1  ? cholecalciferol (VITAMIN D3) 25 MCG (1000 UNIT) tablet Take 1,000 Units by mouth at bedtime.    ? clonazePAM (KLONOPIN) 0.5 MG tablet Take 0.5 mg by mouth at bedtime as needed for anxiety.    ? fosinopril (MONOPRIL) 40 MG tablet Take 1 tablet (40 mg total) by mouth daily. 90 tablet 0  ? gabapentin (NEURONTIN) 300 MG capsule Take 1-2 capsules (300-600 mg total) by mouth at bedtime. 180 capsule 1  ? hydrochlorothiazide (HYDRODIURIL) 25 MG tablet TAKE 1 TABLET BY MOUTH ONCE DAILY IN THE MORNING 90 tablet 1  ? meloxicam (MOBIC) 7.5 MG tablet Take 1 tablet (7.5 mg total) by mouth 2 (two) times daily. 180 tablet 0  ? Multiple Vitamin (MULTIVITAMIN) tablet Take 1 tablet by mouth every morning.     ? omeprazole (PRILOSEC) 20 MG capsule Take 1 capsule (20 mg total) by mouth 2 (two) times daily before a meal. 180 capsule 1  ? Probiotic Product (PROBIOTIC DAILY PO) Take 1 capsule by mouth daily.    ? traMADol (ULTRAM) 50 MG tablet Take 50 mg by mouth every 6 (six) hours as needed for severe pain.    ? zolpidem (AMBIEN) 5 MG tablet Take 1 tablet (5 mg total) by  mouth at bedtime. 30 tablet 5  ? alum hydroxide-mag trisilicate (GAVISCON) 80-20 MG CHEW chewable tablet Chew 1 tablet by mouth daily as needed for indigestion or heartburn.     ? cephALEXin (KEFLEX) 500 MG capsule Take 1 capsule (500 mg total) by mouth as needed. TAKE 4 PRIOR TO DENTAL PROCEDURES 20 capsule 0  ? meclizine (ANTIVERT) 12.5 MG tablet Take 12.5 mg by mouth 3 (three) times daily as needed for dizziness. Dr. Eduard Clos    ? ? ?No results found for this or any previous visit (from the past 48 hour(s)). ?No results found. ? ?Review of Systems  ?Musculoskeletal:  Positive for back pain.  ?Neurological:  Positive for numbness.  ? ?Blood pressure (!) 151/70, pulse 74, temperature 98.2 ?F (36.8 ?C), resp. rate 17, height 5' (1.524 m), weight 95.3 kg, SpO2 94 %. ?Physical Exam ?HENT:  ?   Head: Normocephalic.  ?  Mouth/Throat:  ?   Mouth: Mucous membranes are moist.  ?Eyes:  ?   Pupils: Pupils are equal, round, and reactive to light.  ?Cardiovascular:  ?   Rate and Rhythm: Normal rate.  ?Pulmonary:  ?   Effort: Pulmonary effort is normal.  ?Abdominal:  ?   General: Abdomen is flat.  ?Musculoskeletal:     ?   General: Normal range of motion.  ?Skin: ?   General: Skin is warm.  ?Neurological:  ?   Mental Status: She is alert.  ?   Comments: Strength is 5 out of 5 iliopsoas, quads, hamstrings, gastrocs, into tibialis, and EHL.  ?  ? ?Assessment/Plan ?76 year old presents for decompressive laminectomy interbody fusions L3-4 L4-5 ? ?Mariam Dollar, MD ?02/09/2022, 9:33 AM ? ? ? ?

## 2022-02-09 NOTE — TOC CM/SW Note (Signed)
Dr Wynetta Emery office has arranged home health services with [facility].  ? ?[name] will follow for home health orders.  ? ?[facility] staff will provide any needed DME. ? ? ?Transition of Care (TOC) Screening Note ? ? ?Patient Details  ?Name: Katrina Poole ?Date of Birth: [birth year] ? ? ? ? ? ?Transition of Care Department [facility]) has reviewed patient and no TOC needs have been identified at this time. We will continue to monitor patient advancement through interdisciplinary progression rounds. If new patient transition needs arise, please place a TOC consult. ?  ?

## 2022-02-10 MED ORDER — ACETAMINOPHEN-CODEINE #3 300-30 MG PO TABS: 1.0000 | ORAL_TABLET | ORAL | 0 refills | Status: DC | PRN

## 2022-02-10 MED ORDER — CYCLOBENZAPRINE HCL 10 MG PO TABS: 10.0000 mg | ORAL_TABLET | Freq: Three times a day (TID) | ORAL | 0 refills | Status: DC | PRN

## 2022-02-10 NOTE — Evaluation (Signed)
Physical Therapy Evaluation and Discharge ? ?Patient Details ?Name: Katrina Poole ?MRN: [id number] ?DOB: 1946/03/13 ?Today's Date: 02/10/2022 ? ?History of Present Illness ? Pt is a 76 y/o female who presents s/p L3-L5 PLIF on 02/09/2022. PMH significant for HTN, Meniere Syndrome, restless leg syndrome, ACDF C3-C7, partial knee replacement, laminectomy/decompression L3-L5. ?  ?Clinical Impression ? Patient evaluated by Physical Therapy with no further acute PT needs identified. All education has been completed and the patient has no further questions. Pt was able to demonstrate transfers and ambulation with gross modified independence to supervision for safety. Hands on guarding provided for stair training. Pt was educated on precautions, brace application/wearing schedule, appropriate activity progression, and car transfer. See below for any follow-up Physical Therapy or equipment needs. PT is signing off. Thank you for this referral.    ?   ? ?Recommendations for follow up therapy are one component of a multi-disciplinary discharge planning process, led by the attending physician.  Recommendations may be updated based on patient status, additional functional criteria and insurance authorization. ? ?Follow Up Recommendations No PT follow up ? ?  ?Assistance Recommended at Discharge PRN  ?Patient can return home with the following ? A little help with walking and/or transfers;A little help with bathing/dressing/bathroom;Assistance with cooking/housework;Assist for transportation;Help with stairs or ramp for entrance ? ?  ?Equipment Recommendations None recommended by PT  ?Recommendations for Other Services ?    ?  ?Functional Status Assessment Patient has had a recent decline in their functional status and demonstrates the ability to make significant improvements in function in a reasonable and predictable amount of time.  ? ?  ?Precautions / Restrictions Precautions ?Precautions: Fall;Back ?Precaution Booklet Issued:  Yes (comment) ?Precaution Comments: Reviewed handout and pt was cued for precautions during functional mobility. ?Required Braces or Orthoses: Spinal Brace ?Spinal Brace: Lumbar corset;Applied in sitting position ?Restrictions ?Weight Bearing Restrictions: No  ? ?  ? ?Mobility ? Bed Mobility ?  ?  ?  ?  ?  ?  ?  ?General bed mobility comments: Verbally reviewed log roll technique. ?  ? ?Transfers ?Overall transfer level: Modified independent ?Equipment used: None ?Transfers: Sit to/from Stand ?  ?  ?  ?  ?  ?  ?General transfer comment: Pt demonstrated proper hand placement on seated surface for safety. No assist required. No unsteadiness or LOB noted. ?  ? ?Ambulation/Gait ?Ambulation/Gait assistance: Supervision ?Gait Distance (Feet): 350 Feet ?Assistive device: None ?Gait Pattern/deviations: Step-through pattern, Decreased stride length, Wide base of support ?Gait velocity: Decreased ?Gait velocity interpretation: <1.31 ft/sec, indicative of household ambulator ?  ?General Gait Details: Slow and guarded but overall without unsteadiness or overt LOB. ? ?Stairs ?Stairs: Yes ?Stairs assistance: Min guard ?Stair Management: One rail Right, Step to pattern, Forwards ?Number of Stairs: 7 ?General stair comments: VC's for sequencing and general safety. No assist required. Close guard for safety. ? ?Wheelchair Mobility ?  ? ?Modified Rankin (Stroke Patients Only) ?  ? ?  ? ?Balance   ?  ?  ?  ?  ?  ?  ?  ?  ?  ?  ?  ?  ?  ?  ?  ?  ?  ?  ?   ? ? ? ?Pertinent Vitals/Pain    ? ? ?Home Living Family/patient expects to be discharged to:: Private residence ?Living Arrangements: Spouse/significant other ?Available Help at Discharge: Family;Available 24 hours/day ?Type of Home: House ?Home Access: Stairs to enter ?Entrance Stairs-Rails: Right;Left ?Entrance Stairs-Number of  Steps: 7: 3, platform, 3, platform, 1 small step up to house. ?  ?Home Layout: One level ?Home Equipment: Conservation officer, nature (2 wheels);Cane - single  point;Adaptive equipment ?Additional Comments: Bidet  ?  ?Prior Function Prior Level of Function : Independent/Modified Independent ?  ?  ?  ?  ?  ?  ?Mobility Comments: Has been furniture walking. ?  ?  ? ? ?Hand Dominance  ? Dominant Hand: Right ? ?  ?Extremity/Trunk Assessment  ? Upper Extremity Assessment ?Upper Extremity Assessment: Overall WFL for tasks assessed ?  ? ?Lower Extremity Assessment ?Lower Extremity Assessment: Generalized weakness (MIld; consistent with pre-op diagnosis) ?  ? ?Cervical / Trunk Assessment ?Cervical / Trunk Assessment: Back Surgery  ?Communication  ? Communication: No difficulties  ?Cognition Arousal/Alertness: Awake/alert ?Behavior During Therapy: [facility] for tasks assessed/performed ?Overall Cognitive Status: Within Functional Limits for tasks assessed ?  ?  ?  ?  ?  ?  ?  ?  ?  ?  ?  ?  ?  ?  ?  ?  ?  ?  ?  ? ?  ?General Comments   ? ?  ?Exercises    ? ?Assessment/Plan  ?  ?PT Assessment Patient does not need any further PT services  ?PT Problem List   ? ?   ?  ?PT Treatment Interventions     ? ?PT Goals (Current goals can be found in the Care Plan section)  ?Acute Rehab PT Goals ?Patient Stated Goal: Home today ?PT Goal Formulation: All assessment and education complete, DC therapy ? ?  ?Frequency   ?  ? ? ?Co-evaluation   ?  ?  ?  ?  ? ? ?  ?AM-PAC PT "6 Clicks" Mobility  ?Outcome Measure Help needed turning from your back to your side while in a flat bed without using bedrails?: None ?Help needed moving from lying on your back to sitting on the side of a flat bed without using bedrails?: None ?Help needed moving to and from a bed to a chair (including a wheelchair)?: A Little ?Help needed standing up from a chair using your arms (e.g., wheelchair or bedside chair)?: A Little ?Help needed to walk in hospital room?: A Little ?Help needed climbing 3-5 steps with a railing? : A Little ?6 Click Score: 20 ? ?  ?End of Session Equipment Utilized During Treatment: Gait belt ?Activity  Tolerance: Patient tolerated treatment well ?Patient left: Other (comment) (Standing in room with husband present, preparing to get dressed and wash up) ?Nurse Communication: Mobility status ?PT Visit Diagnosis: Unsteadiness on feet (R26.81);Pain ?Pain - part of body:  (back) ?  ? ?Time: [id number] ?PT Time Calculation (min) (ACUTE ONLY): 24 min ? ? ?Charges:   PT Evaluation ?$PT Eval Low Complexity: 1 Low ?PT Treatments ?$Gait Training: 8-22 mins ?  ?   ? ? ?Rolinda Roan, PT, DPT ?Acute Rehabilitation Services ?Secure Chat Preferred ?Office: [phone]  ? ?Thelma Comp ?02/10/2022, 3:52 PM ? ?

## 2022-02-10 NOTE — Discharge Summary (Signed)
?Physician Discharge Summary  ?Patient ID: ?Katrina Poole ?MRN: [id number] ?DOB/AGE: November 25, 1945 76 y.o. ?Estimated body mass index is 41.01 kg/m? as calculated from the following: ?  Height as of this encounter: 5' (1.524 m). ?  Weight as of this encounter: 95.3 kg. ? ? ?Admit date: 02/09/2022 ?Discharge date: 02/10/2022 ? ?Admission Diagnoses: Lumbar spinal stenosis degenerative disc disease degenerative scoliosis and herniated nucleus pulposus L3-4 L4-5 ? ?Discharge Diagnoses: Same ?Principal Problem: ?  Spinal stenosis at L4-L5 level ? ? ?Discharged Condition: good ? ?Hospital Course: Patient is admitted to the hospital underwent decompressive laminectomy and interbody fusions at L3-4 and L4-5 postoperative patient did very well was kept flat overnight because of a intraoperative inadvertent dural tear however she was mobilized on the day of discharge with no headache ambulating well voiding spontaneously tolerating regular diet and stable for discharge home after being cleared by physical therapy.  Patient will be discharged scheduled follow-up in 1 to 2 weeks. ? ?Consults: ?Significant Diagnostic Studies: ?Treatments: Decompressive laminectomy interbody fusions L3-4 L4-5 ?Discharge Exam: ?Blood pressure (!) 142/77, pulse 94, temperature 98.2 ?F (36.8 ?C), temperature source Oral, resp. rate 18, height 5' (1.524 m), weight 95.3 kg, SpO2 98 %. ?Strength 5 out of 5 wound clean dry and intact ? ?Disposition: Home ? ? ?Allergies as of 02/10/2022   ? ?   Reactions  ? Sulfa Antibiotics Other (See Comments)  ? Hallucinations  ? Zithromax [azithromycin] Hives  ? Oxycodone Other (See Comments)  ? Makes head foggy and interferes with Meniere's syndrome.    ? Beef-derived Products Other (See Comments)  ? Tears up my stomach.  ? ?  ? ?  ?Medication List  ?  ? ?TAKE these medications   ? ?acetaminophen 500 MG tablet ?Commonly known as: TYLENOL ?Take 2 tablets (1,000 mg total) by mouth every 6 (six) hours as needed (DO NOT  EXCEED 4000 MG DAILY FROM ALL SOURCES). ?  ?acetaminophen-codeine 300-30 MG tablet ?Commonly known as: TYLENOL #3 ?Take 1-2 tablets by mouth every 4 (four) hours as needed for severe pain. ?  ?albuterol 108 (90 Base) MCG/ACT inhaler ?Commonly known as: VENTOLIN HFA ?Inhale 2 puffs into the lungs every 6 (six) hours as needed for wheezing or shortness of breath. ?  ?alum hydroxide-mag trisilicate [id number] MG Chew chewable tablet ?Commonly known as: GAVISCON ?Chew 1 tablet by mouth daily as needed for indigestion or heartburn. ?  ?amLODipine 5 MG tablet ?Commonly known as: NORVASC ?Take 1 tablet (5 mg total) by mouth every morning. ?  ?cephALEXin 500 MG capsule ?Commonly known as: KEFLEX ?Take 1 capsule (500 mg total) by mouth as needed. TAKE 4 PRIOR TO DENTAL PROCEDURES ?  ?cholecalciferol 25 MCG (1000 UNIT) tablet ?Commonly known as: VITAMIN D3 ?Take 1,000 Units by mouth at bedtime. ?  ?clonazePAM 0.5 MG tablet ?Commonly known as: KLONOPIN ?Take 0.5 mg by mouth at bedtime as needed for anxiety. ?  ?cyclobenzaprine 10 MG tablet ?Commonly known as: FLEXERIL ?Take 1 tablet (10 mg total) by mouth 3 (three) times daily as needed for muscle spasms. ?  ?fosinopril 40 MG tablet ?Commonly known as: MONOPRIL ?Take 1 tablet (40 mg total) by mouth daily. ?  ?gabapentin 300 MG capsule ?Commonly known as: NEURONTIN ?Take 1-2 capsules (300-600 mg total) by mouth at bedtime. ?  ?hydrochlorothiazide 25 MG tablet ?Commonly known as: HYDRODIURIL ?TAKE 1 TABLET BY MOUTH ONCE DAILY IN THE MORNING ?  ?meclizine 12.5 MG tablet ?Commonly known as: ANTIVERT ?Take 12.5 mg by mouth  3 (three) times daily as needed for dizziness. Dr. Marlyce Huge ?  ?meloxicam 7.5 MG tablet ?Commonly known as: MOBIC ?Take 1 tablet (7.5 mg total) by mouth 2 (two) times daily. ?  ?multivitamin tablet ?Take 1 tablet by mouth every morning. ?  ?omeprazole 20 MG capsule ?Commonly known as: PRILOSEC ?Take 1 capsule (20 mg total) by mouth 2 (two) times daily before a meal. ?   ?PROBIOTIC DAILY PO ?Take 1 capsule by mouth daily. ?  ?traMADol 50 MG tablet ?Commonly known as: ULTRAM ?Take 50 mg by mouth every 6 (six) hours as needed for severe pain. ?  ?zolpidem 5 MG tablet ?Commonly known as: AMBIEN ?Take 1 tablet (5 mg total) by mouth at bedtime. ?  ? ?  ? ? Follow-up Information   ? ? [place]. Follow up.   ?Contact information: ?[address] ?[place] [address] ?[phone] ? ? ?  ?  ? ?  ?  ? ?  ? ? ?Signed: ?Elaina Hoops ?02/10/2022, 7:25 AM ? ? ?

## 2022-02-10 NOTE — Progress Notes (Signed)
Patient in bed with HOB at 45 degrees with no c/o headache or dizziness. Patient comfortable and staff will continue to monitor. Aisha RN ?

## 2022-02-10 NOTE — Discharge Instructions (Signed)

## 2022-02-10 NOTE — Progress Notes (Signed)
Patient alert and oriented, mae's well, voiding adequate amount of urine, swallowing without difficulty, no c/o pain at time of discharge. Patient discharged home with husband. Script and discharged instructions given to patient. Patient and husband stated understanding of instructions given. Patient has an appointment with Dr. Wynetta Emery in 2 weeks ?

## 2022-02-11 DIAGNOSIS — G2581 Restless legs syndrome: Secondary | ICD-10-CM | POA: Diagnosis not present

## 2022-02-11 DIAGNOSIS — F5101 Primary insomnia: Secondary | ICD-10-CM | POA: Diagnosis not present

## 2022-02-11 DIAGNOSIS — R002 Palpitations: Secondary | ICD-10-CM | POA: Diagnosis not present

## 2022-02-11 DIAGNOSIS — M5440 Lumbago with sciatica, unspecified side: Secondary | ICD-10-CM | POA: Diagnosis not present

## 2022-02-11 DIAGNOSIS — M179 Osteoarthritis of knee, unspecified: Secondary | ICD-10-CM | POA: Diagnosis not present

## 2022-02-11 DIAGNOSIS — K219 Gastro-esophageal reflux disease without esophagitis: Secondary | ICD-10-CM | POA: Diagnosis not present

## 2022-02-11 DIAGNOSIS — J4520 Mild intermittent asthma, uncomplicated: Secondary | ICD-10-CM | POA: Diagnosis not present

## 2022-02-11 DIAGNOSIS — Z981 Arthrodesis status: Secondary | ICD-10-CM | POA: Diagnosis not present

## 2022-02-11 DIAGNOSIS — Z4789 Encounter for other orthopedic aftercare: Secondary | ICD-10-CM | POA: Diagnosis not present

## 2022-02-11 DIAGNOSIS — I10 Essential (primary) hypertension: Secondary | ICD-10-CM | POA: Diagnosis not present

## 2022-02-13 DIAGNOSIS — M5440 Lumbago with sciatica, unspecified side: Secondary | ICD-10-CM | POA: Diagnosis not present

## 2022-02-13 DIAGNOSIS — M179 Osteoarthritis of knee, unspecified: Secondary | ICD-10-CM | POA: Diagnosis not present

## 2022-02-13 DIAGNOSIS — F5101 Primary insomnia: Secondary | ICD-10-CM | POA: Diagnosis not present

## 2022-02-13 DIAGNOSIS — R002 Palpitations: Secondary | ICD-10-CM | POA: Diagnosis not present

## 2022-02-13 DIAGNOSIS — I10 Essential (primary) hypertension: Secondary | ICD-10-CM | POA: Diagnosis not present

## 2022-02-13 DIAGNOSIS — J4520 Mild intermittent asthma, uncomplicated: Secondary | ICD-10-CM | POA: Diagnosis not present

## 2022-02-13 DIAGNOSIS — K219 Gastro-esophageal reflux disease without esophagitis: Secondary | ICD-10-CM | POA: Diagnosis not present

## 2022-02-13 DIAGNOSIS — Z981 Arthrodesis status: Secondary | ICD-10-CM | POA: Diagnosis not present

## 2022-02-13 DIAGNOSIS — Z4789 Encounter for other orthopedic aftercare: Secondary | ICD-10-CM | POA: Diagnosis not present

## 2022-02-13 DIAGNOSIS — G2581 Restless legs syndrome: Secondary | ICD-10-CM | POA: Diagnosis not present

## 2022-02-16 ENCOUNTER — Emergency Department (HOSPITAL_COMMUNITY)
Admission: EM | Admit: 2022-02-16 | Discharge: 2022-02-17 | Disposition: A | Discharge status: Home / Self Care | Payer: Medicare Other | Attending: Emergency Medicine | Admitting: Emergency Medicine

## 2022-02-16 ENCOUNTER — Encounter (HOSPITAL_COMMUNITY): Payer: Self-pay | Admitting: Emergency Medicine

## 2022-02-16 ENCOUNTER — Ambulatory Visit
Admission: EM | Admit: 2022-02-16 | Discharge: 2022-02-16 | Disposition: A | Discharge status: Home / Self Care | Payer: Medicare Other

## 2022-02-16 DIAGNOSIS — Z4889 Encounter for other specified surgical aftercare: Secondary | ICD-10-CM

## 2022-02-16 DIAGNOSIS — J45909 Unspecified asthma, uncomplicated: Secondary | ICD-10-CM | POA: Insufficient documentation

## 2022-02-16 DIAGNOSIS — M5440 Lumbago with sciatica, unspecified side: Secondary | ICD-10-CM | POA: Diagnosis not present

## 2022-02-16 DIAGNOSIS — Z981 Arthrodesis status: Secondary | ICD-10-CM | POA: Diagnosis not present

## 2022-02-16 DIAGNOSIS — Z79899 Other long term (current) drug therapy: Secondary | ICD-10-CM | POA: Insufficient documentation

## 2022-02-16 DIAGNOSIS — J4520 Mild intermittent asthma, uncomplicated: Secondary | ICD-10-CM | POA: Diagnosis not present

## 2022-02-16 DIAGNOSIS — Z9889 Other specified postprocedural states: Secondary | ICD-10-CM

## 2022-02-16 DIAGNOSIS — K219 Gastro-esophageal reflux disease without esophagitis: Secondary | ICD-10-CM | POA: Diagnosis not present

## 2022-02-16 DIAGNOSIS — I10 Essential (primary) hypertension: Secondary | ICD-10-CM | POA: Insufficient documentation

## 2022-02-16 DIAGNOSIS — M179 Osteoarthritis of knee, unspecified: Secondary | ICD-10-CM | POA: Diagnosis not present

## 2022-02-16 DIAGNOSIS — F5101 Primary insomnia: Secondary | ICD-10-CM | POA: Diagnosis not present

## 2022-02-16 DIAGNOSIS — Z4801 Encounter for change or removal of surgical wound dressing: Secondary | ICD-10-CM | POA: Diagnosis not present

## 2022-02-16 DIAGNOSIS — L24A9 Irritant contact dermatitis due friction or contact with other specified body fluids: Secondary | ICD-10-CM

## 2022-02-16 DIAGNOSIS — G2581 Restless legs syndrome: Secondary | ICD-10-CM | POA: Diagnosis not present

## 2022-02-16 DIAGNOSIS — L7634 Postprocedural seroma of skin and subcutaneous tissue following other procedure: Secondary | ICD-10-CM | POA: Diagnosis not present

## 2022-02-16 DIAGNOSIS — Z48817 Encounter for surgical aftercare following surgery on the skin and subcutaneous tissue: Secondary | ICD-10-CM | POA: Insufficient documentation

## 2022-02-16 DIAGNOSIS — R002 Palpitations: Secondary | ICD-10-CM | POA: Diagnosis not present

## 2022-02-16 DIAGNOSIS — Z4789 Encounter for other orthopedic aftercare: Secondary | ICD-10-CM | POA: Diagnosis not present

## 2022-02-16 LAB — CBC WITH DIFFERENTIAL/PLATELET
Abs Immature Granulocytes: 0.03 10*3/uL (ref 0.00–0.07)
Basophils Absolute: 0 10*3/uL (ref 0.0–0.1)
Basophils Relative: 0 %
Eosinophils Absolute: 0.4 10*3/uL (ref 0.0–0.5)
Eosinophils Relative: 4 %
HCT: 28.1 % — ABNORMAL LOW (ref 36.0–46.0)
Hemoglobin: 9 g/dL — ABNORMAL LOW (ref 12.0–15.0)
Immature Granulocytes: 0 %
Lymphocytes Relative: 12 %
Lymphs Abs: 1.1 10*3/uL (ref 0.7–4.0)
MCH: 29.2 pg (ref 26.0–34.0)
MCHC: 32 g/dL (ref 30.0–36.0)
MCV: 91.2 fL (ref 80.0–100.0)
Monocytes Absolute: 0.7 10*3/uL (ref 0.1–1.0)
Monocytes Relative: 7 %
Neutro Abs: 6.7 10*3/uL (ref 1.7–7.7)
Neutrophils Relative %: 77 %
Platelets: 442 10*3/uL — ABNORMAL HIGH (ref 150–400)
RBC: 3.08 MIL/uL — ABNORMAL LOW (ref 3.87–5.11)
RDW: 14.4 % (ref 11.5–15.5)
WBC: 8.9 10*3/uL (ref 4.0–10.5)
nRBC: 0 % (ref 0.0–0.2)

## 2022-02-16 LAB — BASIC METABOLIC PANEL
Anion gap: 11 (ref 5–15)
BUN: 12 mg/dL (ref 8–23)
CO2: 25 mmol/L (ref 22–32)
Calcium: 9.3 mg/dL (ref 8.9–10.3)
Chloride: 98 mmol/L (ref 98–111)
Creatinine, Ser: 0.88 mg/dL (ref 0.44–1.00)
GFR, Estimated: >60 mL/min (ref >60)
Glucose, Bld: 113 mg/dL — ABNORMAL HIGH (ref 70–99)
Potassium: 3.8 mmol/L (ref 3.5–5.1)
Sodium: 134 mmol/L — ABNORMAL LOW (ref 135–145)

## 2022-02-16 NOTE — Discharge Instructions (Addendum)
Since you are 1 week post-op lumbar back surgery and are experiencing more drainage than usual along with warmth in the area of the incision, you need to go to [facility] ER now for further evaluation.  ?

## 2022-02-16 NOTE — ED Notes (Signed)
Patient is being discharged from the Urgent Care and sent to the Emergency Department via personal vehicle . Per Lelon Frohlich, NP, patient is in need of higher level of care due to further evaluation. Patient is aware and verbalizes understanding of plan of care.  ?Vitals:  ? 02/16/22 1716  ?BP: (!) 147/77  ?Pulse: 90  ?Resp: 20  ?Temp: 98.2 ?F (36.8 ?C)  ?SpO2: 98%  ?  ?

## 2022-02-16 NOTE — ED Provider Notes (Signed)
?[facility] ? ? ? ?CSN: [id number] ?Arrival date & time: 02/16/22  1634 ? ? ?  ? ?History   ?Chief Complaint ?Chief Complaint  ?Patient presents with  ? Back Pain  ? ? ?HPI ?NEVENA ROZENBERG is a 76 y.o. female.  ? ?76 year old female accompanied by her husband presents with concern over drainage from her incision site from lumbar surgery 1 week ago on 02/09/2022. She had surgery at [facility] with Dr. Wynetta Emery, Neurosurgeon, who fused L3-L4 and L4-L5. She did not have any immediate complications from surgery but the physician indicated that her cartilage was irritated around her spinal cord and concern over possible "nick" and leakage of spinal fluid. She came home the next day. She had PT come to her home the following day 5/10 and she was doing well. However on 5/11, she started experiencing more drainage and her steri-strips came off when trying to change her dressing. She had soaked the honeycomb dressing. On 5/12, she had another visit from PT and they were concerned about the increased drainage and tried to contact Dr. Lonie Peak office with no success. On Saturday 5/13 she tried to get in touch with the on-call physician but still no response. Today, Monday, 1 week after surgery, she had a visit from PT again and her dressing was soaked. She had to borrow a honeycomb dressing from her friend who had knee surgery to place over the incisional wound area. She denies any fever or headache. She denies any numbness and is able to move all extremities. She called Dr. Wynetta Emery again with no response. She is concerned over the excess drainage and wants to confirm it is not spinal fluid. No distinct increase in pain. Has been taking Tylenol #3 as needed for pain. Dr. Wynetta Emery has performed 2 other back/neck surgeries on her with no complications. Other chronic health issues include HTN, GERD, arthritis, Meniere's syndrome, asthma, anxiety and insomnia.  ? ?The history is provided by the patient and the spouse.   ? ?Past Medical History:  ?Diagnosis Date  ? Arthritis   ? osteo of left hip  ? Asthma   ? humidity and exercise during summer  ? GERD (gastroesophageal reflux disease)   ? Hypertension   ? controlled on meds  ? Meniere syndrome   ? hearing deaf right ear  ? Neuromuscular disorder (HCC)   ? finger tips go numb, neck vertebrae  ? Restless leg syndrome   ? Vertigo   ? Wears dentures   ? full dentures on top, partial bottom  ? ? ?Patient Active Problem List  ? Diagnosis Date Noted  ? Spinal stenosis at L4-L5 level 02/09/2022  ? Lumbar radiculopathy 07/21/2021  ? Heart palpitations 07/01/2021  ? Mixed hyperlipidemia 07/01/2021  ? Generalized anxiety disorder 07/01/2021  ? Body mass index (BMI) 40.0-44.9, adult (HCC) 06/12/2021  ? Lumbago of lumbar region with sciatica 06/12/2021  ? 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  ? Essential hypertension 08/16/2020  ? Meniere's disease 08/16/2020  ? Restless leg syndrome 08/16/2020  ? Primary insomnia 08/16/2020  ? Mild intermittent asthma without complication 08/16/2020  ? Diverticulosis of colon 08/16/2020  ? HNP (herniated nucleus pulposus), lumbar 04/29/2020  ? Gastroesophageal reflux disease without esophagitis 01/24/2020  ? Spondylosis of cervical spine 03/16/2018  ? Osteoarthritis of knee 02/06/2018  ? S/P total hip arthroplasty 11/25/2015  ? Status post arthroscopic surgery of left knee 08/15/2015  ? ? ?Past Surgical History:  ?Procedure Laterality Date  ?  ANTERIOR CERVICAL DECOMP/DISCECTOMY FUSION N/A 03/16/2018  ? Procedure: Anterior Cervical Discectomy Fusion - Cervical Three- Cervical Four, Cervical Four- Cervical Five, Cervical Five-Cervical Six, Cervical Six- Cerival Seven;  Surgeon: Donalee Citrinram, Gary, MD;  [facility];  Service: Neurosurgery;  Laterality: N/A;  Anterior Cervical Discectomy Fusion - Cervical Three- Cervical Four, Cervical Four- Cervical Five, Cervical Five-Cervical Six, Cervical Six- Ceriva  ? CARDIAC CATHETERIZATION    ? x2 both  normal  ? CARPAL TUNNEL RELEASE    ? COLONOSCOPY    ? COLONOSCOPY WITH PROPOFOL N/A 06/04/2016  ? Procedure: COLONOSCOPY WITH PROPOFOL;  Surgeon: Christena DeemMartin U Skulskie, MD;  [facility] END[facility]COPY;  Service: Endoscopy;  Laterality: N/A;  ? DILATION AND CURETTAGE OF UTERUS    ? ECTOPIC PREGNANCY SURGERY    ? ESOPHAGOGASTRODUODENOSCOPY (EGD) WITH PROPOFOL N/A 06/04/2016  ? Procedure: ESOPHAGOGASTRODUODENOSCOPY (EGD) WITH PROPOFOL;  Surgeon: Christena DeemMartin U Skulskie, MD;  [facility];  Service: Endoscopy;  Laterality: N/A;  ? exploratory laparoscopy    ? infertility surgery  ? HALLUX VALGUS CORRECTION Bilateral   ? JOINT REPLACEMENT Bilateral   ? partial knee  ? KNEE ARTHROPLASTY Left 01/27/2021  ? Procedure: CONVERSION OF LEFT PARTIAL KNEE TO LEFT TOTAL KNEE.;  Surgeon: Donato HeinzHooten, James P, MD;  Location: [facility] ORS;  Service: Orthopedics;  Laterality: Left;  ? KNEE ARTHROSCOPY Left 07/19/2015  ? Procedure: Arthroscopic limited synovectomy along with lateral compartment and retropatellar chondral debridement plus removal of several small loose bodies;  Surgeon: Erin SonsHarold Kernodle, MD;  [facility];  Service: Orthopedics;  Laterality: Left;  1ST CASE PER CECE  ? LUMBAR LAMINECTOMY/DECOMPRESSION MICRODISCECTOMY Right 04/29/2020  ? Procedure: Microdiscectomy - Lumbar Three-Lumbar Four - Lumbar Four-Lumbar Five - right;  Surgeon: Donalee Citrinram, Gary, MD;  [facility];  Service: Neurosurgery;  Laterality: Right;  Microdiscectomy - Lumbar Three-Lumbar Four - Lumbar Four-Lumbar Five - right  ? MEDIAL PARTIAL KNEE REPLACEMENT Bilateral   ? SHOULDER SURGERY Right   ? TOTAL HIP ARTHROPLASTY Left 11/25/2015  ? Procedure: TOTAL HIP ARTHROPLASTY;  Surgeon: Donato HeinzJames P Hooten, MD;  Location: [facility] ORS;  Service: Orthopedics;  Laterality: Left;  ? TRIGGER FINGER RELEASE Bilateral   ? ? ?OB History   ?No obstetric history on file. ?  ? ? ? ?Home Medications   ? ?Prior to Admission medications   ?Medication Sig Start Date End Date  Taking? Authorizing Provider  ?acetaminophen (TYLENOL) 500 MG tablet Take 2 tablets (1,000 mg total) by mouth every 6 (six) hours as needed (DO NOT EXCEED 4000 MG DAILY FROM ALL SOURCES). 03/30/21  Yes Reubin MilanBerglund, Laura H, MD  ?acetaminophen-codeine (TYLENOL #3) 300-30 MG tablet Take 1-2 tablets by mouth every 4 (four) hours as needed for severe pain. 02/10/22  Yes Donalee Citrinram, Gary, MD  ?albuterol (VENTOLIN HFA) 108 (90 Base) MCG/ACT inhaler Inhale 2 puffs into the lungs every 6 (six) hours as needed for wheezing or shortness of breath. 09/16/20  Yes Reubin MilanBerglund, Laura H, MD  ?alum hydroxide-mag trisilicate (GAVISCON) 80-20 MG CHEW chewable tablet Chew 1 tablet by mouth daily as needed for indigestion or heartburn.    Yes [provider]  ?amLODipine (NORVASC) 5 MG tablet Take 1 tablet (5 mg total) by mouth every morning. 10/23/21  Yes Reubin MilanBerglund, Laura H, MD  ?cholecalciferol (VITAMIN D3) 25 MCG (1000 UNIT) tablet Take 1,000 Units by mouth at bedtime.   Yes [provider]  ?cyclobenzaprine (FLEXERIL) 10 MG tablet Take 1 tablet (10 mg total) by mouth 3 (three) times daily as needed  for muscle spasms. 02/10/22  Yes Donalee Citrin, MD  ?fosinopril (MONOPRIL) 40 MG tablet Take 1 tablet (40 mg total) by mouth daily. 12/24/21  Yes Reubin Milan, MD  ?gabapentin (NEURONTIN) 300 MG capsule Take 1-2 capsules (300-600 mg total) by mouth at bedtime. 10/23/21  Yes Reubin Milan, MD  ?hydrochlorothiazide (HYDRODIURIL) 25 MG tablet TAKE 1 TABLET BY MOUTH ONCE DAILY IN THE MORNING 11/19/21  Yes Reubin Milan, MD  ?Multiple Vitamin (MULTIVITAMIN) tablet Take 1 tablet by mouth every morning.    Yes [provider]  ?traMADol (ULTRAM) 50 MG tablet Take 50 mg by mouth every 6 (six) hours as needed for severe pain. 09/24/21  Yes [provider]  ?zolpidem (AMBIEN) 5 MG tablet Take 1 tablet (5 mg total) by mouth at bedtime. 01/23/22  Yes Reubin Milan, MD  ?cephALEXin (KEFLEX) 500 MG capsule Take 1 capsule  (500 mg total) by mouth as needed. TAKE 4 PRIOR TO DENTAL PROCEDURES 06/26/21   Reubin Milan, MD  ?clonazePAM (KLONOPIN) 0.5 MG tablet Take 0.5 mg by mouth at bedtime as needed for anxiety.    Provider, Histori

## 2022-02-16 NOTE — ED Triage Notes (Signed)
Pt reported to ED for further evaluation of surgical site to lumbar spine. States she has been bleeding. Operation was last Monday. Has attempted to follow up with surgeon's office with no return phone calls.  ?

## 2022-02-16 NOTE — ED Provider Triage Note (Signed)
Emergency Medicine Provider Triage Evaluation Note ? ?Katrina Poole , a 76 y.o. female  was evaluated in triage.  Pt complains of bleeding from postop site.  She is 1 week post posterior lumbar fusion by Dr. Wynetta Emery.  She removed the honeycomb dressing today and bleeding from the area would not stop.  Try to get in touch with Dr. Lonie Peak office but was unable to do so.  Denies any significant pain in the area, having some lightheadedness but denies any other complaints. ? ?Review of Systems  ?Positive: Wound ?Negative: Pain ? ?Physical Exam  ?BP 132/85 (BP Location: Right Arm)   Pulse 87   Temp 98.9 ?F (37.2 ?C) (Oral)   Resp 17   SpO2 97%  ?Gen:   Awake, no distress   ?Resp:  Normal effort  ?MSK:   Moves extremities without difficulty  ?Other:  Honeycomb dressing noted on the back with dried blood in the lumbar area ? ?Medical Decision Making  ?Medically screening exam initiated at 9:56 PM.  Appropriate orders placed.  Elyna G Kundrat was informed that the remainder of the evaluation will be completed by another provider, this initial triage assessment does not replace that evaluation, and the importance of remaining in the ED until their evaluation is complete. ? ?We will order labs ?  ?Dietrich Pates, PA-C ?02/16/22 2156 ? ?

## 2022-02-16 NOTE — ED Triage Notes (Signed)
Patient is here for "Back Pain", 1 wk postop "orthopaedic surg". Went in on Monday "last week" for Surgery. Released on Tuesday, Wednesday in home care visited "that was fine" and then "back Friday". Friday started to have more drainage "so changed dressing", tried calling doctors office then "no phone call". Saturday morning patient called again, No phone from provider "again". Today when home care came again, dressing changed again, ok for a few hrs, now "excess drainage is there again". Nothing to replace this with "for the drainage". No fever.  ?

## 2022-02-17 NOTE — Discharge Instructions (Signed)
You were seen today for postoperative wound check.  Your wound does not appear infected.  Continue dressing changes.  You will be given some honeycomb dressings.  Follow-up closely with your neurosurgeon.  If you develop fevers, systemic symptoms, any new or worsening symptoms, you should be reevaluated. ?

## 2022-02-17 NOTE — ED Provider Notes (Signed)
? ?[facility] EMERGENCY DEPARTMENT ?Provider Note ? ? ?CSN: [id number] ?Arrival date & time: 02/16/22  2033 ? ?  ? ?History ? ?Chief Complaint  ?Patient presents with  ? Post-op Problem  ? ? ?Katrina Poole is a 76 y.o. female. ? ?HPI ? ?  ? ?This is a 76 year old female who presents with concerns for postop wound drainage.  Patient states that she had neurosurgery on 02/09/2022.  This was done by Dr. Wynetta Emery.  She had posterior interbody fusion and disc repair.  She did have intraoperative incidental dural tear which required patching.  She was kept overnight for this reason.  She has not had any headaches.  However, she has noted ongoing drainage from the incision site.  She states that the drainage is pink in color.  She has soaked through several honeycomb bandages and states that the Steri-Strips no longer are adherent because of the drainage.  She has not had any fevers.  She denies any significant pain.  She is concerned for potential infection.  She was seen and evaluated urgent care and sent here for further evaluation.    ? ?Home Medications ?Prior to Admission medications   ?Medication Sig Start Date End Date Taking? Authorizing Provider  ?acetaminophen (TYLENOL) 500 MG tablet Take 2 tablets (1,000 mg total) by mouth every 6 (six) hours as needed (DO NOT EXCEED 4000 MG DAILY FROM ALL SOURCES). 03/30/21   Reubin Milan, MD  ?acetaminophen-codeine (TYLENOL #3) 300-30 MG tablet Take 1-2 tablets by mouth every 4 (four) hours as needed for severe pain. 02/10/22   Donalee Citrin, MD  ?albuterol (VENTOLIN HFA) 108 (90 Base) MCG/ACT inhaler Inhale 2 puffs into the lungs every 6 (six) hours as needed for wheezing or shortness of breath. 09/16/20   Reubin Milan, MD  ?alum hydroxide-mag trisilicate (GAVISCON) 80-20 MG CHEW chewable tablet Chew 1 tablet by mouth daily as needed for indigestion or heartburn.     [provider]  ?amLODipine (NORVASC) 5 MG tablet Take 1 tablet (5 mg total) by  mouth every morning. 10/23/21   Reubin Milan, MD  ?cephALEXin (KEFLEX) 500 MG capsule Take 1 capsule (500 mg total) by mouth as needed. TAKE 4 PRIOR TO DENTAL PROCEDURES 06/26/21   Reubin Milan, MD  ?cholecalciferol (VITAMIN D3) 25 MCG (1000 UNIT) tablet Take 1,000 Units by mouth at bedtime.    [provider]  ?clonazePAM (KLONOPIN) 0.5 MG tablet Take 0.5 mg by mouth at bedtime as needed for anxiety.    [provider]  ?cyclobenzaprine (FLEXERIL) 10 MG tablet Take 1 tablet (10 mg total) by mouth 3 (three) times daily as needed for muscle spasms. 02/10/22   Donalee Citrin, MD  ?fosinopril (MONOPRIL) 40 MG tablet Take 1 tablet (40 mg total) by mouth daily. 12/24/21   Reubin Milan, MD  ?gabapentin (NEURONTIN) 300 MG capsule Take 1-2 capsules (300-600 mg total) by mouth at bedtime. 10/23/21   Reubin Milan, MD  ?hydrochlorothiazide (HYDRODIURIL) 25 MG tablet TAKE 1 TABLET BY MOUTH ONCE DAILY IN THE MORNING 11/19/21   Reubin Milan, MD  ?meclizine (ANTIVERT) 12.5 MG tablet Take 12.5 mg by mouth 3 (three) times daily as needed for dizziness. Dr. Eduard Clos    [provider]  ?meloxicam (MOBIC) 7.5 MG tablet Take 1 tablet (7.5 mg total) by mouth 2 (two) times daily. 10/23/21   Reubin Milan, MD  ?Multiple Vitamin (MULTIVITAMIN) tablet Take 1 tablet by mouth every morning.  [provider]  ?omeprazole (PRILOSEC) 20 MG capsule Take 1 capsule (20 mg total) by mouth 2 (two) times daily before a meal. 10/23/21   Reubin MilanBerglund, Laura H, MD  ?Probiotic Product (PROBIOTIC DAILY PO) Take 1 capsule by mouth daily.    [provider]  ?traMADol (ULTRAM) 50 MG tablet Take 50 mg by mouth every 6 (six) hours as needed for severe pain. 09/24/21   [provider]  ?zolpidem (AMBIEN) 5 MG tablet Take 1 tablet (5 mg total) by mouth at bedtime. 01/23/22   Reubin MilanBerglund, Laura H, MD  ?   ? ?Allergies    ?Sulfa antibiotics, Zithromax [azithromycin], Oxycodone, and Beef-derived products    ? ?Review of Systems   ?Review of Systems  ?Skin:  Positive for wound.  ?All other systems reviewed and are negative. ? ?Physical Exam ?Updated Vital Signs ?BP (!) 133/55   Pulse 81   Temp 98.9 ?F (37.2 ?C) (Oral)   Resp 18   SpO2 100%  ?Physical Exam ?Vitals and nursing note reviewed.  ?Constitutional:   ?   Appearance: She is well-developed. She is obese.  ?HENT:  ?   Head: Normocephalic and atraumatic.  ?   Mouth/Throat:  ?   Mouth: Mucous membranes are moist.  ?Eyes:  ?   Pupils: Pupils are equal, round, and reactive to light.  ?Cardiovascular:  ?   Rate and Rhythm: Normal rate and regular rhythm.  ?   Heart sounds: Normal heart sounds.  ?Pulmonary:  ?   Effort: Pulmonary effort is normal. No respiratory distress.  ?Abdominal:  ?   Palpations: Abdomen is soft.  ?Musculoskeletal:  ?   Cervical back: Neck supple.  ?   Comments: Posterior lumbar incision clean, serosanguineous drainage noted, unclear where the source is, no dehiscence of the wound, no adjacent erythema, no fluctuance, unable to express drainage  ?Skin: ?   General: Skin is warm and dry.  ?Neurological:  ?   Mental Status: She is alert and oriented to person, place, and time.  ?Psychiatric:     ?   Mood and Affect: Mood normal.  ? ? ? ?ED Results / Procedures / Treatments   ?Labs ?(all labs ordered are listed, but only abnormal results are displayed) ?Labs Reviewed  ?BASIC METABOLIC PANEL - Abnormal; Notable for the following components:  ?    Result Value  ? Sodium 134 (*)   ? Glucose, Bld 113 (*)   ? All other components within normal limits  ?CBC WITH DIFFERENTIAL/PLATELET - Abnormal; Notable for the following components:  ? RBC 3.08 (*)   ? Hemoglobin 9.0 (*)   ? HCT 28.1 (*)   ? Platelets 442 (*)   ? All other components within normal limits  ? ? ?EKG ?None ? ?Radiology ?No results found. ? ?Procedures ?Procedures  ? ? ?Medications Ordered in ED ?Medications - No data to display ? ?ED Course/ Medical Decision Making/ A&P ?Clinical Course  as of 02/17/22 0436  ?Tue Feb 17, 2022  ?0402 Spoke with Dr. Maurice Smallstergard.  He feels that she can follow-up in clinic. [CH]  ?  ?Clinical Course User Index ?[CH] Mairin Lindsley, Mayer Maskerourtney F, MD  ? ?                        ?Medical Decision Making ? ?This patient presents to the ED for concern of postoperative infection, this involves an extensive number of treatment options, and is a complaint that carries with it  a high risk of complications and morbidity.  I considered the following differential and admission for this acute, potentially life threatening condition.  The differential diagnosis includes postoperative infection, normal wound changes, seroma, wound dehiscence ? ?MDM:   ? ?This is a 76 year old female who presents with concern for postoperative infection.  She is nontoxic and vital signs are reassuring.  She is afebrile.  She denies systemic symptoms.  Mostly is concerned about ongoing drainage of the wound.  On my evaluation, there is serosanguineous drainage.  No purulence.  No obvious dehiscence.  No fluctuance to indicate abscess or seroma.  Aside from drainage, wound appears appropriately healing.  Patient does not have any other symptoms.  White count is 8.9 without a left shift.  Otherwise her metabolic panel is normal.  I discussed with neurosurgery on-call.  He recommends follow-up in the office.  Feel this is reasonable given there is no obvious infection.  Patient was provided with some honeycomb dressings. ? ?(Labs, imaging, consults) ? ?Labs: ?I Ordered, and personally interpreted labs.  The pertinent results include: CBC, BMP ? ?Imaging Studies ordered: ?I ordered imaging studies including none ?I independently visualized and interpreted imaging. ?I agree with the radiologist interpretation ? ?Additional history obtained from husband at the bedside.  External records from outside source obtained and reviewed including OR note ? ?Cardiac Monitoring: ?The patient was maintained on a cardiac monitor.  I  personally viewed and interpreted the cardiac monitored which showed an underlying rhythm of: Normal sinus rhythm ? ?Reevaluation: ?After the interventions noted above, I reevaluated the patient and found

## 2022-02-18 DIAGNOSIS — Z981 Arthrodesis status: Secondary | ICD-10-CM | POA: Diagnosis not present

## 2022-02-18 DIAGNOSIS — G2581 Restless legs syndrome: Secondary | ICD-10-CM | POA: Diagnosis not present

## 2022-02-18 DIAGNOSIS — I10 Essential (primary) hypertension: Secondary | ICD-10-CM | POA: Diagnosis not present

## 2022-02-18 DIAGNOSIS — R002 Palpitations: Secondary | ICD-10-CM | POA: Diagnosis not present

## 2022-02-18 DIAGNOSIS — F5101 Primary insomnia: Secondary | ICD-10-CM | POA: Diagnosis not present

## 2022-02-18 DIAGNOSIS — K219 Gastro-esophageal reflux disease without esophagitis: Secondary | ICD-10-CM | POA: Diagnosis not present

## 2022-02-18 DIAGNOSIS — M179 Osteoarthritis of knee, unspecified: Secondary | ICD-10-CM | POA: Diagnosis not present

## 2022-02-18 DIAGNOSIS — Z4789 Encounter for other orthopedic aftercare: Secondary | ICD-10-CM | POA: Diagnosis not present

## 2022-02-18 DIAGNOSIS — M5440 Lumbago with sciatica, unspecified side: Secondary | ICD-10-CM | POA: Diagnosis not present

## 2022-02-18 DIAGNOSIS — J4520 Mild intermittent asthma, uncomplicated: Secondary | ICD-10-CM | POA: Diagnosis not present

## 2022-02-20 DIAGNOSIS — G2581 Restless legs syndrome: Secondary | ICD-10-CM | POA: Diagnosis not present

## 2022-02-20 DIAGNOSIS — K219 Gastro-esophageal reflux disease without esophagitis: Secondary | ICD-10-CM | POA: Diagnosis not present

## 2022-02-20 DIAGNOSIS — J4520 Mild intermittent asthma, uncomplicated: Secondary | ICD-10-CM | POA: Diagnosis not present

## 2022-02-20 DIAGNOSIS — M179 Osteoarthritis of knee, unspecified: Secondary | ICD-10-CM | POA: Diagnosis not present

## 2022-02-20 DIAGNOSIS — M5440 Lumbago with sciatica, unspecified side: Secondary | ICD-10-CM | POA: Diagnosis not present

## 2022-02-20 DIAGNOSIS — Z4789 Encounter for other orthopedic aftercare: Secondary | ICD-10-CM | POA: Diagnosis not present

## 2022-02-20 DIAGNOSIS — F5101 Primary insomnia: Secondary | ICD-10-CM | POA: Diagnosis not present

## 2022-02-20 DIAGNOSIS — R002 Palpitations: Secondary | ICD-10-CM | POA: Diagnosis not present

## 2022-02-20 DIAGNOSIS — I10 Essential (primary) hypertension: Secondary | ICD-10-CM | POA: Diagnosis not present

## 2022-02-20 DIAGNOSIS — Z981 Arthrodesis status: Secondary | ICD-10-CM | POA: Diagnosis not present

## 2022-02-24 DIAGNOSIS — L24A9 Irritant contact dermatitis due friction or contact with other specified body fluids: Secondary | ICD-10-CM | POA: Diagnosis not present

## 2022-02-27 DIAGNOSIS — G2581 Restless legs syndrome: Secondary | ICD-10-CM | POA: Diagnosis not present

## 2022-02-27 DIAGNOSIS — J4520 Mild intermittent asthma, uncomplicated: Secondary | ICD-10-CM | POA: Diagnosis not present

## 2022-02-27 DIAGNOSIS — Z4789 Encounter for other orthopedic aftercare: Secondary | ICD-10-CM | POA: Diagnosis not present

## 2022-02-27 DIAGNOSIS — Z981 Arthrodesis status: Secondary | ICD-10-CM | POA: Diagnosis not present

## 2022-02-27 DIAGNOSIS — I10 Essential (primary) hypertension: Secondary | ICD-10-CM | POA: Diagnosis not present

## 2022-02-27 DIAGNOSIS — M5440 Lumbago with sciatica, unspecified side: Secondary | ICD-10-CM | POA: Diagnosis not present

## 2022-02-27 DIAGNOSIS — K219 Gastro-esophageal reflux disease without esophagitis: Secondary | ICD-10-CM | POA: Diagnosis not present

## 2022-02-27 DIAGNOSIS — F5101 Primary insomnia: Secondary | ICD-10-CM | POA: Diagnosis not present

## 2022-02-27 DIAGNOSIS — M179 Osteoarthritis of knee, unspecified: Secondary | ICD-10-CM | POA: Diagnosis not present

## 2022-02-27 DIAGNOSIS — R002 Palpitations: Secondary | ICD-10-CM | POA: Diagnosis not present

## 2022-03-09 ENCOUNTER — Other Ambulatory Visit: Payer: Self-pay | Admitting: Internal Medicine

## 2022-03-09 ENCOUNTER — Encounter: Payer: Self-pay | Admitting: Internal Medicine

## 2022-03-09 DIAGNOSIS — I10 Essential (primary) hypertension: Secondary | ICD-10-CM | POA: Diagnosis not present

## 2022-03-09 DIAGNOSIS — Z4789 Encounter for other orthopedic aftercare: Secondary | ICD-10-CM | POA: Diagnosis not present

## 2022-03-09 DIAGNOSIS — M5440 Lumbago with sciatica, unspecified side: Secondary | ICD-10-CM | POA: Diagnosis not present

## 2022-03-09 DIAGNOSIS — J4520 Mild intermittent asthma, uncomplicated: Secondary | ICD-10-CM | POA: Diagnosis not present

## 2022-03-09 DIAGNOSIS — K219 Gastro-esophageal reflux disease without esophagitis: Secondary | ICD-10-CM | POA: Diagnosis not present

## 2022-03-09 DIAGNOSIS — F5101 Primary insomnia: Secondary | ICD-10-CM | POA: Diagnosis not present

## 2022-03-09 DIAGNOSIS — M179 Osteoarthritis of knee, unspecified: Secondary | ICD-10-CM | POA: Diagnosis not present

## 2022-03-09 DIAGNOSIS — F411 Generalized anxiety disorder: Secondary | ICD-10-CM

## 2022-03-09 DIAGNOSIS — Z981 Arthrodesis status: Secondary | ICD-10-CM | POA: Diagnosis not present

## 2022-03-09 DIAGNOSIS — R002 Palpitations: Secondary | ICD-10-CM | POA: Diagnosis not present

## 2022-03-09 DIAGNOSIS — G2581 Restless legs syndrome: Secondary | ICD-10-CM | POA: Diagnosis not present

## 2022-03-09 MED ORDER — ALBUTEROL SULFATE HFA 108 (90 BASE) MCG/ACT IN AERS: 2.0000 | INHALATION_SPRAY | Freq: Four times a day (QID) | RESPIRATORY_TRACT | 1 refills | Status: DC | PRN

## 2022-03-09 MED ORDER — CLONAZEPAM 0.5 MG PO TABS: 0.5000 mg | ORAL_TABLET | Freq: Every evening | ORAL | 0 refills | Status: DC | PRN

## 2022-03-15 ENCOUNTER — Other Ambulatory Visit: Payer: Self-pay | Admitting: Internal Medicine

## 2022-03-15 DIAGNOSIS — I10 Essential (primary) hypertension: Secondary | ICD-10-CM

## 2022-03-16 NOTE — Telephone Encounter (Signed)
Requested Prescriptions  Pending Prescriptions Disp Refills  . fosinopril (MONOPRIL) 40 MG tablet [Pharmacy Med Name: Fosinopril Sodium 40 MG Oral Tablet] 90 tablet 0    Sig: Take 1 tablet by mouth once daily     Cardiovascular:  ACE Inhibitors Passed - 03/15/2022 11:05 AM      Passed - Cr in normal range and within 180 days    Creatinine, Ser  Date Value Ref Range Status  02/16/2022 0.88 0.44 - 1.00 mg/dL Final         Passed - K in normal range and within 180 days    Potassium  Date Value Ref Range Status  02/16/2022 3.8 3.5 - 5.1 mmol/L Final         Passed - Patient is not pregnant      Passed - Last BP in normal range    BP Readings from Last 1 Encounters:  02/17/22 (!) 133/55         Passed - Valid encounter within last 6 months    Recent Outpatient Visits          4 months ago Annual physical exam   [facility] Glean Hess, MD   8 months ago Essential hypertension   [place], [place]   11 months ago Essential hypertension   [facility] Glean Hess, MD   1 year ago Essential hypertension   [facility] Glean Hess, MD   1 year ago Restless leg syndrome   [facility] Glean Hess, MD      Future Appointments            In 1 month Army Melia, Jesse Sans, MD [facility], [facility]

## 2022-03-31 ENCOUNTER — Encounter: Payer: Self-pay | Admitting: Internal Medicine

## 2022-03-31 ENCOUNTER — Other Ambulatory Visit: Payer: Self-pay | Admitting: Internal Medicine

## 2022-03-31 DIAGNOSIS — M1712 Unilateral primary osteoarthritis, left knee: Secondary | ICD-10-CM

## 2022-03-31 DIAGNOSIS — M5440 Lumbago with sciatica, unspecified side: Secondary | ICD-10-CM | POA: Diagnosis not present

## 2022-03-31 MED ORDER — MELOXICAM 7.5 MG PO TABS: 7.5000 mg | ORAL_TABLET | Freq: Two times a day (BID) | ORAL | 0 refills | Status: DC

## 2022-04-20 ENCOUNTER — Encounter: Payer: Self-pay | Admitting: Internal Medicine

## 2022-04-20 ENCOUNTER — Other Ambulatory Visit: Payer: Self-pay | Admitting: Internal Medicine

## 2022-04-20 DIAGNOSIS — G2581 Restless legs syndrome: Secondary | ICD-10-CM

## 2022-04-20 DIAGNOSIS — K219 Gastro-esophageal reflux disease without esophagitis: Secondary | ICD-10-CM

## 2022-04-20 MED ORDER — GABAPENTIN 300 MG PO CAPS: 300.0000 mg | ORAL_CAPSULE | Freq: Every day | ORAL | 1 refills | Status: DC

## 2022-04-20 MED ORDER — OMEPRAZOLE 20 MG PO CPDR: 20.0000 mg | DELAYED_RELEASE_CAPSULE | Freq: Two times a day (BID) | ORAL | 1 refills | Status: DC

## 2022-04-22 ENCOUNTER — Encounter: Payer: Self-pay | Admitting: Internal Medicine

## 2022-04-22 ENCOUNTER — Ambulatory Visit (INDEPENDENT_AMBULATORY_CARE_PROVIDER_SITE_OTHER): Payer: Medicare Other | Admitting: Internal Medicine

## 2022-04-22 VITALS — BP 124/78 | HR 84 | Ht 60.0 in | Wt 210.0 lb

## 2022-04-22 DIAGNOSIS — I10 Essential (primary) hypertension: Secondary | ICD-10-CM | POA: Diagnosis not present

## 2022-04-22 DIAGNOSIS — R7303 Prediabetes: Secondary | ICD-10-CM

## 2022-04-22 LAB — POCT GLYCOSYLATED HEMOGLOBIN (HGB A1C): Hemoglobin A1C: 5.5 % (ref 4.0–5.6)

## 2022-04-22 NOTE — Progress Notes (Signed)
Date:  04/22/2022   Name:  Katrina Poole   DOB:  Jun 24, 1946   MRN:  [id number]   Chief Complaint: Hypertension and Diabetes  Hypertension This is a chronic problem. The problem is controlled. Pertinent negatives include no chest pain, headaches, palpitations or shortness of breath. Past treatments include calcium channel blockers, ACE inhibitors and diuretics. There is no history of kidney disease, CAD/MI or CVA.  Diabetes She presents for her follow-up diabetic visit. Diabetes type: prediabetes. Pertinent negatives for hypoglycemia include no dizziness, headaches or nervousness/anxiousness. Pertinent negatives for diabetes include no chest pain, no fatigue and no weakness. Pertinent negatives for diabetic complications include no CVA. Current diabetic treatment includes diet.  She has cut back on carbs but has not lost any weight. She has used [employer] in the recent past and lost 39 lbs.  Gained back 20 lbs when she had back surgery.  Lab Results  Component Value Date   NA 134 (L) 02/16/2022   K 3.8 02/16/2022   CO2 25 02/16/2022   GLUCOSE 113 (H) 02/16/2022   BUN 12 02/16/2022   CREATININE 0.88 02/16/2022   CALCIUM 9.3 02/16/2022   EGFR 71 10/23/2021   GFRNONAA >60 02/16/2022   Lab Results  Component Value Date   CHOL 211 (H) 10/23/2021   HDL 70 10/23/2021   LDLCALC 123 (H) 10/23/2021   TRIG 103 10/23/2021   CHOLHDL 3.0 10/23/2021   Lab Results  Component Value Date   TSH 1.900 10/23/2021   Lab Results  Component Value Date   HGBA1C 5.7 (H) 10/23/2021   Lab Results  Component Value Date   WBC 8.9 02/16/2022   HGB 9.0 (L) 02/16/2022   HCT 28.1 (L) 02/16/2022   MCV 91.2 02/16/2022   PLT 442 (H) 02/16/2022   Lab Results  Component Value Date   ALT 12 10/23/2021   AST 19 10/23/2021   ALKPHOS 104 10/23/2021   BILITOT 0.4 10/23/2021   No results found for: "25OHVITD2", "25OHVITD3", "VD25OH"   Review of Systems  Constitutional:  Negative for fatigue and  unexpected weight change.  HENT:  Negative for nosebleeds.   Eyes:  Negative for visual disturbance.  Respiratory:  Negative for cough, chest tightness, shortness of breath and wheezing.   Cardiovascular:  Negative for chest pain, palpitations and leg swelling.  Gastrointestinal:  Negative for abdominal pain, constipation and diarrhea.  Neurological:  Negative for dizziness, weakness, light-headedness and headaches.  Psychiatric/Behavioral:  Negative for dysphoric mood and sleep disturbance. The patient is not nervous/anxious.     Patient Active Problem List   Diagnosis Date Noted   Spinal stenosis at L4-L5 level 02/09/2022   Lumbar radiculopathy 07/21/2021   Heart palpitations 07/01/2021   Mixed hyperlipidemia 07/01/2021   Generalized anxiety disorder 07/01/2021   Body mass index (BMI) 40.0-44.9, adult ([place]) 06/12/2021   Lumbago of lumbar region with sciatica 06/12/2021   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   Essential hypertension 08/16/2020   Meniere's disease 08/16/2020   Restless leg syndrome 08/16/2020   Primary insomnia 08/16/2020   Mild intermittent asthma without complication 29/56/2130   Diverticulosis of colon 08/16/2020   HNP (herniated nucleus pulposus), lumbar 04/29/2020   Gastroesophageal reflux disease without esophagitis 01/24/2020   Spondylosis of cervical spine 03/16/2018   Osteoarthritis of knee 02/06/2018   S/P total hip arthroplasty 11/25/2015   Status post arthroscopic surgery of left knee 08/15/2015    Allergies  Allergen Reactions   Sulfa Antibiotics Other (See Comments)  Hallucinations   Zithromax [Azithromycin] Hives   Oxycodone Other (See Comments)    Makes head foggy and interferes with Meniere's syndrome.     Beef-Derived Products Other (See Comments)    Tears up my stomach.    Past Surgical History:  Procedure Laterality Date   ANTERIOR CERVICAL DECOMP/DISCECTOMY FUSION N/A 03/16/2018   Procedure: Anterior Cervical  Discectomy Fusion - Cervical Three- Cervical Four, Cervical Four- Cervical Five, Cervical Five-Cervical Six, Cervical Six- Cerival Seven;  Surgeon: Kary Kos, MD;  Location: [place];  Service: Neurosurgery;  Laterality: N/A;  Anterior Cervical Discectomy Fusion - Cervical Three- Cervical Four, Cervical Four- Cervical Five, Cervical Five-Cervical Six, Cervical Six- Ceriva   CARDIAC CATHETERIZATION     x2 both normal   CARPAL TUNNEL RELEASE     COLONOSCOPY     COLONOSCOPY WITH PROPOFOL N/A 06/04/2016   Procedure: COLONOSCOPY WITH PROPOFOL;  Surgeon: Lollie Sails, MD;  Location: [facility] ENDOSCOPY;  Service: Endoscopy;  Laterality: N/A;   DILATION AND CURETTAGE OF UTERUS     ECTOPIC PREGNANCY SURGERY     ESOPHAGOGASTRODUODENOSCOPY (EGD) WITH PROPOFOL N/A 06/04/2016   Procedure: ESOPHAGOGASTRODUODENOSCOPY (EGD) WITH PROPOFOL;  Surgeon: Lollie Sails, MD;  Location: [facility] ENDOSCOPY;  Service: Endoscopy;  Laterality: N/A;   exploratory laparoscopy     infertility surgery   HALLUX VALGUS CORRECTION Bilateral    JOINT REPLACEMENT Bilateral    partial knee   KNEE ARTHROPLASTY Left 01/27/2021   Procedure: CONVERSION OF LEFT PARTIAL KNEE TO LEFT TOTAL KNEE.;  Surgeon: Dereck Leep, MD;  Location: [facility] ORS;  Service: Orthopedics;  Laterality: Left;   KNEE ARTHROSCOPY Left 07/19/2015   Procedure: Arthroscopic limited synovectomy along with lateral compartment and retropatellar chondral debridement plus removal of several small loose bodies;  Surgeon: Leanor Kail, MD;  Location: [place];  Service: Orthopedics;  Laterality: Left;  1ST CASE PER CECE   LUMBAR LAMINECTOMY/DECOMPRESSION MICRODISCECTOMY Right 04/29/2020   Procedure: Microdiscectomy - Lumbar Three-Lumbar Four - Lumbar Four-Lumbar Five - right;  Surgeon: Kary Kos, MD;  Location: [place];  Service: Neurosurgery;  Laterality: Right;  Microdiscectomy - Lumbar Three-Lumbar Four - Lumbar Four-Lumbar Five - right   MEDIAL PARTIAL  KNEE REPLACEMENT Bilateral    POSTERIOR LUMBAR FUSION  02/2022   L3-4 and L4-5   SHOULDER SURGERY Right    TOTAL HIP ARTHROPLASTY Left 11/25/2015   Procedure: TOTAL HIP ARTHROPLASTY;  Surgeon: Dereck Leep, MD;  Location: [facility] OR[facility];  Service: Orthopedics;  Laterality: Left;   TRIGGER FINGER RELEASE Bilateral     Social History   Tobacco Use   Smoking status: Former    Packs/day: 1.50    Years: 30.00    Total pack years: 45.00    Types: Cigarettes    Quit date: 11/12/1990    Years since quitting: 31.4   Smokeless tobacco: Never  Vaping Use   Vaping Use: Never used  Substance Use Topics   Alcohol use: Yes    Alcohol/week: 1.0 standard drink of alcohol    Types: 1 Standard drinks or equivalent per week    Comment: OCC [place], maybe 1 drink a week   Drug use: No     Medication list has been reviewed and updated.  Current Meds  Medication Sig   acetaminophen (TYLENOL) 500 MG tablet Take 2 tablets (1,000 mg total) by mouth every 6 (six) hours as needed (DO NOT EXCEED 4000 MG DAILY FROM ALL SOURCES).   acetaminophen-codeine (TYLENOL #3) 300-30 MG tablet  Take 1-2 tablets by mouth every 4 (four) hours as needed for severe pain.   albuterol (VENTOLIN HFA) 108 (90 Base) MCG/ACT inhaler Inhale 2 puffs into the lungs every 6 (six) hours as needed for wheezing or shortness of breath.   alum hydroxide-mag trisilicate (GAVISCON) 00-17 MG CHEW chewable tablet Chew 1 tablet by mouth daily as needed for indigestion or heartburn.    amLODipine (NORVASC) 5 MG tablet Take 1 tablet (5 mg total) by mouth every morning.   cholecalciferol (VITAMIN D3) 25 MCG (1000 UNIT) tablet Take 1,000 Units by mouth at bedtime.   clonazePAM (KLONOPIN) 0.5 MG tablet Take 1 tablet (0.5 mg total) by mouth at bedtime as needed for anxiety.   fosinopril (MONOPRIL) 40 MG tablet Take 1 tablet by mouth once daily   gabapentin (NEURONTIN) 300 MG capsule Take 1-2 capsules (300-600 mg total) by mouth at bedtime.    hydrochlorothiazide (HYDRODIURIL) 25 MG tablet TAKE 1 TABLET BY MOUTH ONCE DAILY IN THE MORNING   meclizine (ANTIVERT) 12.5 MG tablet Take 12.5 mg by mouth 3 (three) times daily as needed for dizziness. Dr. Marlyce Huge   meloxicam (MOBIC) 7.5 MG tablet Take 1 tablet (7.5 mg total) by mouth 2 (two) times daily.   Multiple Vitamin (MULTIVITAMIN) tablet Take 1 tablet by mouth every morning.    omeprazole (PRILOSEC) 20 MG capsule Take 1 capsule (20 mg total) by mouth 2 (two) times daily before a meal.   Probiotic Product (PROBIOTIC DAILY PO) Take 1 capsule by mouth daily.   traMADol (ULTRAM) 50 MG tablet Take 50 mg by mouth every 6 (six) hours as needed for severe pain.   zolpidem (AMBIEN) 5 MG tablet Take 1 tablet (5 mg total) by mouth at bedtime.       04/22/2022    8:30 AM 10/23/2021    8:54 AM 07/01/2021    1:21 PM 04/17/2021    8:48 AM  GAD 7 : Generalized Anxiety Score  Nervous, Anxious, on Edge 1 0 1 0  Control/stop worrying 1 0 1 0  Worry too much - different things 1 0 1 0  Trouble relaxing 0 1 1 0  Restless 1 1 1  0  Easily annoyed or irritable 2 0 2 0  Afraid - awful might happen 0 1 0 0  Total GAD 7 Score 6 3 7  0  Anxiety Difficulty Not difficult at all  Not difficult at all Not difficult at all       04/22/2022    8:30 AM 10/23/2021    8:53 AM 10/13/2021    8:37 AM  Depression screen PHQ 2/9  Decreased Interest 0 0 0  Down, Depressed, Hopeless 1 0 0  PHQ - 2 Score 1 0 0  Altered sleeping 0 1   Tired, decreased energy 0 0   Change in appetite 0 0   Feeling bad or failure about yourself  0 0   Trouble concentrating 0 0   Moving slowly or fidgety/restless 1 0   Suicidal thoughts 0 0   PHQ-9 Score 2 1   Difficult doing work/chores Not difficult at all Not difficult at all     BP Readings from Last 3 Encounters:  04/22/22 138/80  02/17/22 (!) 133/55  02/16/22 (!) 147/77    Physical Exam Vitals and nursing note reviewed.  Constitutional:      General: She is not in acute  distress.    Appearance: She is well-developed.  HENT:     Head: Normocephalic and  atraumatic.  Neck:     Vascular: No carotid bruit.  Cardiovascular:     Rate and Rhythm: Normal rate and regular rhythm.     Heart sounds: No murmur heard. Pulmonary:     Effort: Pulmonary effort is normal. No respiratory distress.     Breath sounds: No wheezing or rhonchi.  Musculoskeletal:     Cervical back: Normal range of motion.     Right lower leg: No edema.     Left lower leg: No edema.  Lymphadenopathy:     Cervical: No cervical adenopathy.  Skin:    General: Skin is warm and dry.     Findings: No rash.  Neurological:     General: No focal deficit present.     Mental Status: She is alert and oriented to person, place, and time.  Psychiatric:        Mood and Affect: Mood normal.        Behavior: Behavior normal.     Wt Readings from Last 3 Encounters:  04/22/22 210 lb (95.3 kg)  02/16/22 210 lb 1.6 oz (95.3 kg)  02/09/22 210 lb (95.3 kg)    BP 138/80   Pulse 84   Ht 5' (1.524 m)   Wt 210 lb (95.3 kg)   SpO2 97%   BMI 41.01 kg/m   Assessment and Plan: 1. Prediabetes BS much improved with diet changes Will recheck in 6 mo at [facility] - POCT glycosylated hemoglobin (Hb A1C)  2. Essential hypertension Clinically stable exam with well controlled BP. Tolerating medications without side effects at this time. Pt to continue current regimen and low sodium diet; benefits of regular exercise as able discussed. Recommend resuming [employer] program.    Psychologist, occupational. Any errors are unintentional.  Halina Maidens, MD [facility]  04/22/2022

## 2022-05-09 ENCOUNTER — Other Ambulatory Visit: Payer: Self-pay | Admitting: Internal Medicine

## 2022-05-11 NOTE — Telephone Encounter (Signed)
Requested Prescriptions  Pending Prescriptions Disp Refills  . hydrochlorothiazide (HYDRODIURIL) 25 MG tablet [Pharmacy Med Name: hydroCHLOROthiazide 25 MG Oral Tablet] 90 tablet 0    Sig: TAKE 1 TABLET BY MOUTH ONCE DAILY IN THE MORNING     Cardiovascular: Diuretics - Thiazide Failed - 05/09/2022  2:59 PM      Failed - Na in normal range and within 180 days    Sodium  Date Value Ref Range Status  02/16/2022 134 (L) 135 - 145 mmol/L Final  10/23/2021 137 134 - 144 mmol/L Final         Passed - Cr in normal range and within 180 days    Creatinine, Ser  Date Value Ref Range Status  02/16/2022 0.88 0.44 - 1.00 mg/dL Final         Passed - K in normal range and within 180 days    Potassium  Date Value Ref Range Status  02/16/2022 3.8 3.5 - 5.1 mmol/L Final         Passed - Last BP in normal range    BP Readings from Last 1 Encounters:  04/22/22 124/78         Passed - Valid encounter within last 6 months    Recent Outpatient Visits          2 weeks ago Prediabetes   [facility] Reubin Milan, MD   6 months ago Annual physical exam   [facility] Reubin Milan, MD   10 months ago Essential hypertension   [facility] Reubin Milan, MD   1 year ago Essential hypertension   [facility] Reubin Milan, MD   1 year ago Essential hypertension   [facility] Reubin Milan, MD      Future Appointments            In 5 months Judithann Graves Nyoka Cowden, MD [facility], [facility]

## 2022-05-18 DIAGNOSIS — R262 Difficulty in walking, not elsewhere classified: Secondary | ICD-10-CM | POA: Diagnosis not present

## 2022-05-18 DIAGNOSIS — Z4789 Encounter for other orthopedic aftercare: Secondary | ICD-10-CM | POA: Diagnosis not present

## 2022-05-18 DIAGNOSIS — M5416 Radiculopathy, lumbar region: Secondary | ICD-10-CM | POA: Diagnosis not present

## 2022-05-20 DIAGNOSIS — Z4789 Encounter for other orthopedic aftercare: Secondary | ICD-10-CM | POA: Diagnosis not present

## 2022-05-20 DIAGNOSIS — R262 Difficulty in walking, not elsewhere classified: Secondary | ICD-10-CM | POA: Diagnosis not present

## 2022-05-20 DIAGNOSIS — M5416 Radiculopathy, lumbar region: Secondary | ICD-10-CM | POA: Diagnosis not present

## 2022-05-26 DIAGNOSIS — Z96652 Presence of left artificial knee joint: Secondary | ICD-10-CM | POA: Diagnosis not present

## 2022-05-26 DIAGNOSIS — Z4789 Encounter for other orthopedic aftercare: Secondary | ICD-10-CM | POA: Diagnosis not present

## 2022-05-26 DIAGNOSIS — R262 Difficulty in walking, not elsewhere classified: Secondary | ICD-10-CM | POA: Diagnosis not present

## 2022-05-26 DIAGNOSIS — M5416 Radiculopathy, lumbar region: Secondary | ICD-10-CM | POA: Diagnosis not present

## 2022-05-29 DIAGNOSIS — M5416 Radiculopathy, lumbar region: Secondary | ICD-10-CM | POA: Diagnosis not present

## 2022-05-29 DIAGNOSIS — R262 Difficulty in walking, not elsewhere classified: Secondary | ICD-10-CM | POA: Diagnosis not present

## 2022-05-29 DIAGNOSIS — Z4789 Encounter for other orthopedic aftercare: Secondary | ICD-10-CM | POA: Diagnosis not present

## 2022-06-04 DIAGNOSIS — R262 Difficulty in walking, not elsewhere classified: Secondary | ICD-10-CM | POA: Diagnosis not present

## 2022-06-04 DIAGNOSIS — Z4789 Encounter for other orthopedic aftercare: Secondary | ICD-10-CM | POA: Diagnosis not present

## 2022-06-04 DIAGNOSIS — M5416 Radiculopathy, lumbar region: Secondary | ICD-10-CM | POA: Diagnosis not present

## 2022-06-09 DIAGNOSIS — M5416 Radiculopathy, lumbar region: Secondary | ICD-10-CM | POA: Diagnosis not present

## 2022-06-09 DIAGNOSIS — Z4789 Encounter for other orthopedic aftercare: Secondary | ICD-10-CM | POA: Diagnosis not present

## 2022-06-09 DIAGNOSIS — R262 Difficulty in walking, not elsewhere classified: Secondary | ICD-10-CM | POA: Diagnosis not present

## 2022-06-11 ENCOUNTER — Other Ambulatory Visit: Payer: Self-pay | Admitting: Neurosurgery

## 2022-06-11 DIAGNOSIS — M5440 Lumbago with sciatica, unspecified side: Secondary | ICD-10-CM

## 2022-06-11 DIAGNOSIS — R262 Difficulty in walking, not elsewhere classified: Secondary | ICD-10-CM | POA: Diagnosis not present

## 2022-06-11 DIAGNOSIS — Z4789 Encounter for other orthopedic aftercare: Secondary | ICD-10-CM | POA: Diagnosis not present

## 2022-06-11 DIAGNOSIS — M5416 Radiculopathy, lumbar region: Secondary | ICD-10-CM | POA: Diagnosis not present

## 2022-06-15 ENCOUNTER — Ambulatory Visit
Admission: RE | Admit: 2022-06-15 | Discharge: 2022-06-15 | Disposition: A | Discharge status: Home / Self Care | Payer: Medicare Other | Source: Ambulatory Visit | Attending: Neurosurgery | Admitting: Neurosurgery

## 2022-06-15 DIAGNOSIS — M5440 Lumbago with sciatica, unspecified side: Secondary | ICD-10-CM | POA: Diagnosis not present

## 2022-06-15 DIAGNOSIS — M4727 Other spondylosis with radiculopathy, lumbosacral region: Secondary | ICD-10-CM | POA: Diagnosis not present

## 2022-06-15 DIAGNOSIS — Z4789 Encounter for other orthopedic aftercare: Secondary | ICD-10-CM | POA: Diagnosis not present

## 2022-06-15 DIAGNOSIS — M48061 Spinal stenosis, lumbar region without neurogenic claudication: Secondary | ICD-10-CM | POA: Diagnosis not present

## 2022-06-15 DIAGNOSIS — M5416 Radiculopathy, lumbar region: Secondary | ICD-10-CM | POA: Diagnosis not present

## 2022-06-15 DIAGNOSIS — M5117 Intervertebral disc disorders with radiculopathy, lumbosacral region: Secondary | ICD-10-CM | POA: Diagnosis not present

## 2022-06-15 DIAGNOSIS — R262 Difficulty in walking, not elsewhere classified: Secondary | ICD-10-CM | POA: Diagnosis not present

## 2022-06-15 MED ORDER — GADOBUTROL 1 MMOL/ML IV SOLN
9.0000 mL | Freq: Once | INTRAVENOUS | Status: AC | PRN
  Administered 2022-06-15: 9 mL via INTRAVENOUS

## 2022-06-17 DIAGNOSIS — R262 Difficulty in walking, not elsewhere classified: Secondary | ICD-10-CM | POA: Diagnosis not present

## 2022-06-17 DIAGNOSIS — Z4789 Encounter for other orthopedic aftercare: Secondary | ICD-10-CM | POA: Diagnosis not present

## 2022-06-17 DIAGNOSIS — M5416 Radiculopathy, lumbar region: Secondary | ICD-10-CM | POA: Diagnosis not present

## 2022-06-20 ENCOUNTER — Other Ambulatory Visit: Payer: Self-pay | Admitting: Internal Medicine

## 2022-06-20 DIAGNOSIS — M1712 Unilateral primary osteoarthritis, left knee: Secondary | ICD-10-CM

## 2022-06-22 NOTE — Telephone Encounter (Signed)
Requested medication (s) are due for refill today: yes  Requested medication (s) are on the active medication list: yes  Last refill:  03/31/22 #180  Future visit scheduled: yes  Notes to clinic:  last Hgb and HCT abnormal - sending back to office to make sure reorder is appropriate   Requested Prescriptions  Pending Prescriptions Disp Refills   meloxicam (MOBIC) 7.5 MG tablet [Pharmacy Med Name: Meloxicam 7.5 MG Oral Tablet] 180 tablet 0    Sig: Take 1 tablet by mouth twice daily     Analgesics:  COX2 Inhibitors Failed - 06/20/2022 10:00 AM      Failed - Manual Review: Labs are only required if the patient has taken medication for more than 8 weeks.      Failed - HGB in normal range and within 360 days    Hemoglobin  Date Value Ref Range Status  02/16/2022 9.0 (L) 12.0 - 15.0 g/dL Final  10/23/2021 11.5 11.1 - 15.9 g/dL Final         Failed - HCT in normal range and within 360 days    HCT  Date Value Ref Range Status  02/16/2022 28.1 (L) 36.0 - 46.0 % Final   Hematocrit  Date Value Ref Range Status  10/23/2021 33.9 (L) 34.0 - 46.6 % Final         Passed - Cr in normal range and within 360 days    Creatinine, Ser  Date Value Ref Range Status  02/16/2022 0.88 0.44 - 1.00 mg/dL Final         Passed - AST in normal range and within 360 days    AST  Date Value Ref Range Status  10/23/2021 19 0 - 40 IU/L Final         Passed - ALT in normal range and within 360 days    ALT  Date Value Ref Range Status  10/23/2021 12 0 - 32 IU/L Final         Passed - eGFR is 30 or above and within 360 days    GFR calc Af Amer  Date Value Ref Range Status  04/22/2020 >60 >60 mL/min Final   GFR, Estimated  Date Value Ref Range Status  02/16/2022 >60 >60 mL/min Final    Comment:    (NOTE) Calculated using the CKD-EPI Creatinine Equation (2021)    eGFR  Date Value Ref Range Status  10/23/2021 71 >59 mL/min/1.73 Final         Passed - Patient is not pregnant      Passed  - Valid encounter within last 12 months    Recent Outpatient Visits           2 months ago Prediabetes   [place] [facility] at [facility], Jesse Sans, MD   8 months ago Annual physical exam   [place] [facility] at [facility], Jesse Sans, MD   11 months ago Essential hypertension   [place] [facility] at [facility], Jesse Sans, MD   1 year ago Essential hypertension   [place] [facility] at [facility], Jesse Sans, MD   1 year ago Essential hypertension   [place] [facility] at [facility], Jesse Sans, MD       Future Appointments             In 4 months Halina Maidens  Lemmie Evens, MD [place] [facility] at [facility], [facility]

## 2022-06-23 DIAGNOSIS — Z4789 Encounter for other orthopedic aftercare: Secondary | ICD-10-CM | POA: Diagnosis not present

## 2022-06-23 DIAGNOSIS — R262 Difficulty in walking, not elsewhere classified: Secondary | ICD-10-CM | POA: Diagnosis not present

## 2022-06-23 DIAGNOSIS — M5416 Radiculopathy, lumbar region: Secondary | ICD-10-CM | POA: Diagnosis not present

## 2022-06-25 DIAGNOSIS — M5416 Radiculopathy, lumbar region: Secondary | ICD-10-CM | POA: Diagnosis not present

## 2022-06-25 DIAGNOSIS — Z4789 Encounter for other orthopedic aftercare: Secondary | ICD-10-CM | POA: Diagnosis not present

## 2022-06-25 DIAGNOSIS — R262 Difficulty in walking, not elsewhere classified: Secondary | ICD-10-CM | POA: Diagnosis not present

## 2022-06-29 DIAGNOSIS — M5416 Radiculopathy, lumbar region: Secondary | ICD-10-CM | POA: Diagnosis not present

## 2022-06-29 DIAGNOSIS — Z4789 Encounter for other orthopedic aftercare: Secondary | ICD-10-CM | POA: Diagnosis not present

## 2022-06-29 DIAGNOSIS — R262 Difficulty in walking, not elsewhere classified: Secondary | ICD-10-CM | POA: Diagnosis not present

## 2022-07-02 ENCOUNTER — Other Ambulatory Visit: Payer: Self-pay | Admitting: Internal Medicine

## 2022-07-02 DIAGNOSIS — R262 Difficulty in walking, not elsewhere classified: Secondary | ICD-10-CM | POA: Diagnosis not present

## 2022-07-02 DIAGNOSIS — Z4789 Encounter for other orthopedic aftercare: Secondary | ICD-10-CM | POA: Diagnosis not present

## 2022-07-02 DIAGNOSIS — I10 Essential (primary) hypertension: Secondary | ICD-10-CM

## 2022-07-02 DIAGNOSIS — M5416 Radiculopathy, lumbar region: Secondary | ICD-10-CM | POA: Diagnosis not present

## 2022-07-02 NOTE — Telephone Encounter (Signed)
Requested Prescriptions  Pending Prescriptions Disp Refills  . fosinopril (MONOPRIL) 40 MG tablet [Pharmacy Med Name: Fosinopril Sodium 40 MG Oral Tablet] 90 tablet 0    Sig: Take 1 tablet by mouth once daily     Cardiovascular:  ACE Inhibitors Passed - 07/02/2022  8:14 AM      Passed - Cr in normal range and within 180 days    Creatinine, Ser  Date Value Ref Range Status  02/16/2022 0.88 0.44 - 1.00 mg/dL Final         Passed - K in normal range and within 180 days    Potassium  Date Value Ref Range Status  02/16/2022 3.8 3.5 - 5.1 mmol/L Final         Passed - Patient is not pregnant      Passed - Last BP in normal range    BP Readings from Last 1 Encounters:  04/22/22 124/78         Passed - Valid encounter within last 6 months    Recent Outpatient Visits          2 months ago Prediabetes   [place] [facility] at [facility], Jesse Sans, MD   8 months ago Annual physical exam   [place] [facility] at [facility], Jesse Sans, MD   1 year ago Essential hypertension   [place] [facility] at [facility], Jesse Sans, MD   1 year ago Essential hypertension   [place] [facility] at [facility], Jesse Sans, MD   1 year ago Essential hypertension   [place] [facility] at [facility], Jesse Sans, MD      Future Appointments            In 3 months Army Melia, Jesse Sans, MD [place] [facility] at [facility], [facility]

## 2022-07-06 DIAGNOSIS — R262 Difficulty in walking, not elsewhere classified: Secondary | ICD-10-CM | POA: Diagnosis not present

## 2022-07-06 DIAGNOSIS — Z4789 Encounter for other orthopedic aftercare: Secondary | ICD-10-CM | POA: Diagnosis not present

## 2022-07-06 DIAGNOSIS — M5416 Radiculopathy, lumbar region: Secondary | ICD-10-CM | POA: Diagnosis not present

## 2022-07-07 DIAGNOSIS — M5416 Radiculopathy, lumbar region: Secondary | ICD-10-CM | POA: Diagnosis not present

## 2022-07-21 DIAGNOSIS — M5440 Lumbago with sciatica, unspecified side: Secondary | ICD-10-CM | POA: Diagnosis not present

## 2022-07-29 ENCOUNTER — Other Ambulatory Visit: Payer: Self-pay

## 2022-07-29 ENCOUNTER — Other Ambulatory Visit: Payer: Self-pay | Admitting: Internal Medicine

## 2022-07-29 ENCOUNTER — Encounter: Payer: Self-pay | Admitting: Internal Medicine

## 2022-07-29 DIAGNOSIS — F5101 Primary insomnia: Secondary | ICD-10-CM

## 2022-07-29 DIAGNOSIS — I10 Essential (primary) hypertension: Secondary | ICD-10-CM

## 2022-07-29 MED ORDER — AMLODIPINE BESYLATE 5 MG PO TABS: 5.0000 mg | ORAL_TABLET | ORAL | 0 refills | Status: DC

## 2022-07-29 MED ORDER — ZOLPIDEM TARTRATE 5 MG PO TABS: 5.0000 mg | ORAL_TABLET | Freq: Every day | ORAL | 5 refills | Status: DC

## 2022-07-29 NOTE — Telephone Encounter (Signed)
Please review. Last office visit 04/22/22.  KP

## 2022-08-03 DIAGNOSIS — M47816 Spondylosis without myelopathy or radiculopathy, lumbar region: Secondary | ICD-10-CM | POA: Diagnosis not present

## 2022-08-20 DIAGNOSIS — M47816 Spondylosis without myelopathy or radiculopathy, lumbar region: Secondary | ICD-10-CM | POA: Diagnosis not present

## 2022-08-20 DIAGNOSIS — M4317 Spondylolisthesis, lumbosacral region: Secondary | ICD-10-CM | POA: Diagnosis not present

## 2022-08-25 ENCOUNTER — Other Ambulatory Visit: Payer: Self-pay | Admitting: Neurosurgery

## 2022-08-25 DIAGNOSIS — M4317 Spondylolisthesis, lumbosacral region: Secondary | ICD-10-CM

## 2022-08-26 ENCOUNTER — Ambulatory Visit
Admission: RE | Admit: 2022-08-26 | Discharge: 2022-08-26 | Disposition: A | Discharge status: Home / Self Care | Payer: Medicare Other | Source: Ambulatory Visit | Attending: Neurosurgery | Admitting: Neurosurgery

## 2022-08-26 DIAGNOSIS — M4317 Spondylolisthesis, lumbosacral region: Secondary | ICD-10-CM | POA: Diagnosis not present

## 2022-08-26 DIAGNOSIS — Z01818 Encounter for other preprocedural examination: Secondary | ICD-10-CM | POA: Diagnosis not present

## 2022-08-26 DIAGNOSIS — M545 Low back pain, unspecified: Secondary | ICD-10-CM | POA: Diagnosis not present

## 2022-09-14 ENCOUNTER — Other Ambulatory Visit: Payer: Self-pay | Admitting: Internal Medicine

## 2022-09-14 DIAGNOSIS — F411 Generalized anxiety disorder: Secondary | ICD-10-CM

## 2022-09-14 MED ORDER — CLONAZEPAM 0.5 MG PO TABS: 0.5000 mg | ORAL_TABLET | Freq: Every evening | ORAL | 0 refills | Status: DC | PRN

## 2022-09-16 ENCOUNTER — Other Ambulatory Visit: Payer: Self-pay | Admitting: Neurosurgery

## 2022-09-16 ENCOUNTER — Other Ambulatory Visit: Payer: Self-pay | Admitting: Internal Medicine

## 2022-09-16 DIAGNOSIS — M1712 Unilateral primary osteoarthritis, left knee: Secondary | ICD-10-CM

## 2022-09-16 NOTE — Telephone Encounter (Signed)
Requested medication (s) are due for refill today: yes  Requested medication (s) are on the active medication list: yes  Last refill:  06/22/22 #180/0  Future visit scheduled: yes  Notes to clinic:  Unable to refill per protocol due to failed labs, no updated results.      Requested Prescriptions  Pending Prescriptions Disp Refills   meloxicam (MOBIC) 7.5 MG tablet [Pharmacy Med Name: Meloxicam 7.5 MG Oral Tablet] 180 tablet 0    Sig: Take 1 tablet by mouth twice daily     Analgesics:  COX2 Inhibitors Failed - 09/16/2022  9:33 AM      Failed - Manual Review: Labs are only required if the patient has taken medication for more than 8 weeks.      Failed - HGB in normal range and within 360 days    Hemoglobin  Date Value Ref Range Status  02/16/2022 9.0 (L) 12.0 - 15.0 g/dL Final  10/23/2021 11.5 11.1 - 15.9 g/dL Final         Failed - HCT in normal range and within 360 days    HCT  Date Value Ref Range Status  02/16/2022 28.1 (L) 36.0 - 46.0 % Final   Hematocrit  Date Value Ref Range Status  10/23/2021 33.9 (L) 34.0 - 46.6 % Final         Passed - Cr in normal range and within 360 days    Creatinine, Ser  Date Value Ref Range Status  02/16/2022 0.88 0.44 - 1.00 mg/dL Final         Passed - AST in normal range and within 360 days    AST  Date Value Ref Range Status  10/23/2021 19 0 - 40 IU/L Final         Passed - ALT in normal range and within 360 days    ALT  Date Value Ref Range Status  10/23/2021 12 0 - 32 IU/L Final         Passed - eGFR is 30 or above and within 360 days    GFR calc Af Amer  Date Value Ref Range Status  04/22/2020 >60 >60 mL/min Final   GFR, Estimated  Date Value Ref Range Status  02/16/2022 >60 >60 mL/min Final    Comment:    (NOTE) Calculated using the CKD-EPI Creatinine Equation (2021)    eGFR  Date Value Ref Range Status  10/23/2021 71 >59 mL/min/1.73 Final         Passed - Patient is not pregnant      Passed - Valid  encounter within last 12 months    Recent Outpatient Visits           4 months ago Prediabetes   [place] [facility] at [facility], Jesse Sans, MD   10 months ago Annual physical exam   [place] [facility] at [facility], Jesse Sans, MD   1 year ago Essential hypertension   [place] [facility] at [facility], Jesse Sans, MD   1 year ago Essential hypertension   [place] [facility] at [facility], Jesse Sans, MD   1 year ago Essential hypertension   [place] [facility] at [facility], Jesse Sans, MD       Future Appointments             In 1 month Halina Maidens  Lemmie Evens, MD [place] [facility] at [facility], [facility]

## 2022-09-17 ENCOUNTER — Other Ambulatory Visit: Payer: Self-pay

## 2022-09-17 DIAGNOSIS — M47816 Spondylosis without myelopathy or radiculopathy, lumbar region: Secondary | ICD-10-CM | POA: Insufficient documentation

## 2022-09-21 ENCOUNTER — Other Ambulatory Visit: Payer: Self-pay

## 2022-09-22 ENCOUNTER — Ambulatory Visit (INDEPENDENT_AMBULATORY_CARE_PROVIDER_SITE_OTHER): Payer: Medicare Other | Admitting: Vascular Surgery

## 2022-09-22 ENCOUNTER — Encounter: Payer: Self-pay | Admitting: Vascular Surgery

## 2022-09-22 VITALS — BP 157/94 | HR 73 | Temp 97.7°F

## 2022-09-22 DIAGNOSIS — M5416 Radiculopathy, lumbar region: Secondary | ICD-10-CM

## 2022-09-22 NOTE — Progress Notes (Signed)
Patient name: Katrina Poole MRN: [id number] DOB: Aug 05, 1946 Sex: female  REASON FOR CONSULT: Abdominal exposure for L5-S1 ALIF  HPI: Katrina Poole is a 76 y.o. female, with history of hypertension and Mnire's disease and chronic back pain that presents for preop evaluation of abdominal exposure for L5-S1 ALIF.  She describes significant pain with leg numbness since at least August.  She has previously had an L3-L5 PLIF.  Previous abdominal surgery includes laparoscopy for infertility years ago.  Past Medical History:  Diagnosis Date   Aortic atherosclerosis (HCC)    Arthritis    osteo of left hip   Asthma    humidity and exercise during summer   GERD (gastroesophageal reflux disease)    Hypertension    controlled on meds   Meniere syndrome    hearing deaf right ear   Neuromuscular disorder (HCC)    finger tips go numb, neck vertebrae   Restless leg syndrome    Spondylolisthesis, lumbosacral region    Vertigo    Wears dentures    full dentures on top, partial bottom    Past Surgical History:  Procedure Laterality Date   ANTERIOR CERVICAL DECOMP/DISCECTOMY FUSION N/A 03/16/2018   Procedure: Anterior Cervical Discectomy Fusion - Cervical Three- Cervical Four, Cervical Four- Cervical Five, Cervical Five-Cervical Six, Cervical Six- Cerival Seven;  Surgeon: Donalee Citrin, MD;  Location: [facility] OR;  Service: Neurosurgery;  Laterality: N/A;  Anterior Cervical Discectomy Fusion - Cervical Three- Cervical Four, Cervical Four- Cervical Five, Cervical Five-Cervical Six, Cervical Six- Ceriva   CARDIAC CATHETERIZATION     x2 both normal   CARPAL TUNNEL RELEASE     COLONOSCOPY     COLONOSCOPY WITH PROPOFOL N/A 06/04/2016   Procedure: COLONOSCOPY WITH PROPOFOL;  Surgeon: Christena Deem, MD;  Location: [facility] ENDOSCOPY;  Service: Endoscopy;  Laterality: N/A;   DILATION AND CURETTAGE OF UTERUS     ECTOPIC PREGNANCY SURGERY     ESOPHAGOGASTRODUODENOSCOPY (EGD) WITH PROPOFOL N/A  06/04/2016   Procedure: ESOPHAGOGASTRODUODENOSCOPY (EGD) WITH PROPOFOL;  Surgeon: Christena Deem, MD;  Location: [facility] ENDOSCOPY;  Service: Endoscopy;  Laterality: N/A;   exploratory laparoscopy     infertility surgery   HALLUX VALGUS CORRECTION Bilateral    JOINT REPLACEMENT Bilateral    partial knee   KNEE ARTHROPLASTY Left 01/27/2021   Procedure: CONVERSION OF LEFT PARTIAL KNEE TO LEFT TOTAL KNEE.;  Surgeon: Donato Heinz, MD;  Location: [facility] ORS;  Service: Orthopedics;  Laterality: Left;   KNEE ARTHROSCOPY Left 07/19/2015   Procedure: Arthroscopic limited synovectomy along with lateral compartment and retropatellar chondral debridement plus removal of several small loose bodies;  Surgeon: Erin Sons, MD;  Location: [facility];  Service: Orthopedics;  Laterality: Left;  1ST CASE PER CECE   LUMBAR LAMINECTOMY/DECOMPRESSION MICRODISCECTOMY Right 04/29/2020   Procedure: Microdiscectomy - Lumbar Three-Lumbar Four - Lumbar Four-Lumbar Five - right;  Surgeon: Donalee Citrin, MD;  Location: [facility] OR;  Service: Neurosurgery;  Laterality: Right;  Microdiscectomy - Lumbar Three-Lumbar Four - Lumbar Four-Lumbar Five - right   MEDIAL PARTIAL KNEE REPLACEMENT Bilateral    POSTERIOR LUMBAR FUSION  02/2022   L3-4 and L4-5   SHOULDER SURGERY Right    TOTAL HIP ARTHROPLASTY Left 11/25/2015   Procedure: TOTAL HIP ARTHROPLASTY;  Surgeon: Donato Heinz, MD;  Location: [facility] ORS;  Service: Orthopedics;  Laterality: Left;   TRIGGER FINGER RELEASE Bilateral     Family History  Problem Relation Age of Onset   Heart failure Mother  COPD Mother    Hypertension Mother    Cancer Father    Stroke Brother     SOCIAL HISTORY: Social History   Socioeconomic History   Marital status: Married    Spouse name: Not on file   Number of children: 1   Years of education: Not on file   Highest education level: Not on file  Occupational History   Not on file  Tobacco Use   Smoking status: Former     Packs/day: 1.50    Years: 30.00    Total pack years: 45.00    Types: Cigarettes    Quit date: 11/12/1990    Years since quitting: 31.8   Smokeless tobacco: Never  Vaping Use   Vaping Use: Never used  Substance and Sexual Activity   Alcohol use: Yes    Alcohol/week: 1.0 standard drink of alcohol    Types: 1 Standard drinks or equivalent per week    Comment: OCC [place], maybe 1 drink a week   Drug use: No   Sexual activity: Not Currently  Other Topics Concern   Not on file  Social History Narrative   Not on file   Social Determinants of Health   Financial Resource Strain: Low Risk  (10/13/2021)   Overall Financial Resource Strain (CARDIA)    Difficulty of Paying Living Expenses: Not hard at all  Food Insecurity: No Food Insecurity (10/13/2021)   Hunger Vital Sign    Worried About Running Out of Food in the Last Year: Never true    Ran Out of Food in the Last Year: Never true  Transportation Needs: No Transportation Needs (10/13/2021)   PRAPARE - Administrator, Civil ServiceTransportation    Lack of Transportation (Medical): No    Lack of Transportation (Non-Medical): No  Physical Activity: Inactive (10/13/2021)   Exercise Vital Sign    Days of Exercise per Week: 0 days    Minutes of Exercise per Session: 0 min  Stress: No Stress Concern Present (10/13/2021)   [employer] - Occupational Stress Questionnaire    Feeling of Stress : Not at all  Social Connections: Moderately Integrated (10/13/2021)   Social Connection and Isolation Panel [NHANES]    Frequency of Communication with Friends and Family: More than three times a week    Frequency of Social Gatherings with Friends and Family: Three times a week    Attends Religious Services: Never    Active Member of Clubs or Organizations: Yes    Attends BankerClub or Organization Meetings: More than 4 times per year    Marital Status: Married  Catering managerntimate Partner Violence: Not At Risk (10/13/2021)   Humiliation, Afraid, Rape, and Kick  questionnaire    Fear of Current or Ex-Partner: No    Emotionally Abused: No    Physically Abused: No    Sexually Abused: No    Allergies  Allergen Reactions   Azithromycin Hives and Other (See Comments)   Sulfa Antibiotics Other (See Comments) and Rash    Hallucinations   Oxycodone Other (See Comments)    Makes head foggy and interferes with Meniere's syndrome.     Other Other (See Comments)   Sulfacetamide    Beef-Derived Products Other (See Comments)    Tears up my stomach.    Current Outpatient Medications  Medication Sig Dispense Refill   acetaminophen (TYLENOL) 500 MG tablet Take 2 tablets (1,000 mg total) by mouth every 6 (six) hours as needed (DO NOT EXCEED 4000 MG DAILY FROM ALL SOURCES).  30 tablet 2   acetaminophen-codeine (TYLENOL #3) 300-30 MG tablet Take 1-2 tablets by mouth every 4 (four) hours as needed for severe pain. 30 tablet 0   albuterol (VENTOLIN HFA) 108 (90 Base) MCG/ACT inhaler Inhale 2 puffs into the lungs every 6 (six) hours as needed for wheezing or shortness of breath. 18 g 1   alum hydroxide-mag trisilicate (GAVISCON) 80-20 MG CHEW chewable tablet Chew 1 tablet by mouth daily as needed for indigestion or heartburn.      amLODipine (NORVASC) 5 MG tablet Take 1 tablet (5 mg total) by mouth every morning. 90 tablet 0   cholecalciferol (VITAMIN D3) 25 MCG (1000 UNIT) tablet Take 1,000 Units by mouth at bedtime.     clonazePAM (KLONOPIN) 0.5 MG tablet Take 1 tablet (0.5 mg total) by mouth at bedtime as needed for anxiety. 30 tablet 0   DULoxetine (CYMBALTA) 20 MG capsule Take by mouth.     etodolac (LODINE XL) 400 MG 24 hr tablet Take by mouth.     fosinopril (MONOPRIL) 40 MG tablet Take 1 tablet by mouth once daily 90 tablet 0   gabapentin (NEURONTIN) 300 MG capsule Take 1-2 capsules (300-600 mg total) by mouth at bedtime. 180 capsule 1   hydrochlorothiazide (HYDRODIURIL) 25 MG tablet TAKE 1 TABLET BY MOUTH ONCE DAILY IN THE MORNING 90 tablet 1   ketorolac  (ACULAR) 0.5 % ophthalmic solution Apply to eye.     LACTOBACILLUS PO Take 1 capsule by mouth daily.     meclizine (ANTIVERT) 12.5 MG tablet Take 12.5 mg by mouth 3 (three) times daily as needed for dizziness. Dr. Eduard Clos     meloxicam (MOBIC) 7.5 MG tablet Take 1 tablet by mouth twice daily 180 tablet 0   Multiple Vitamin (MULTIVITAMIN) tablet Take 1 tablet by mouth every morning.      omeprazole (PRILOSEC) 20 MG capsule Take by mouth.     Probiotic Product (PROBIOTIC DAILY PO) Take 1 capsule by mouth daily.     traMADol (ULTRAM) 50 MG tablet Take by mouth.     zolpidem (AMBIEN) 5 MG tablet Take 1 tablet (5 mg total) by mouth at bedtime. 30 tablet 5   No current facility-administered medications for this visit.    REVIEW OF SYSTEMS:  [X]  denotes positive finding, [ ]  denotes negative finding Cardiac  Comments:  Chest pain or chest pressure:    Shortness of breath upon exertion:    Short of breath when lying flat:    Irregular heart rhythm:        Vascular    Pain in calf, thigh, or hip brought on by ambulation:    Pain in feet at night that wakes you up from your sleep:     Blood clot in your veins:    Leg swelling:         Pulmonary    Oxygen at home:    Productive cough:     Wheezing:         Neurologic    Sudden weakness in arms or legs:     Sudden numbness in arms or legs:     Sudden onset of difficulty speaking or slurred speech:    Temporary loss of vision in one eye:     Problems with dizziness:         Gastrointestinal    Blood in stool:     Vomited blood:         Genitourinary    Burning when urinating:  Blood in urine:        Psychiatric    Major depression:         Hematologic    Bleeding problems:    Problems with blood clotting too easily:        Skin    Rashes or ulcers:        Constitutional    Fever or chills:      PHYSICAL EXAM: Vitals:   09/22/22 1006  BP: (!) 157/94  Pulse: 73  Temp: 97.7 F (36.5 C)  TempSrc: Temporal     GENERAL: The patient is a well-nourished female, in no acute distress. The vital signs are documented above. CARDIAC: There is a regular rate and rhythm.  VASCULAR:  Palpable femoral pulses bilaterally Palpable AT pulses bilaterally PULMONARY: No respiratory distress. ABDOMEN: Soft and non-tender. MUSCULOSKELETAL: There are no major deformities or cyanosis. NEUROLOGIC: No focal weakness or paresthesias are detected. SKIN: There are no ulcers or rashes noted. PSYCHIATRIC: The patient has a normal affect.  DATA:   MRI reviewed   Assessment/Plan:  76 year old female with chronic lower back pain with lower extremity radiculopathy that presents for preop evaluation of abdominal exposure for L5-S1 ALIF.  I have reviewed her MRI and discussed she would be a good candidate for anterior approach.  Discussed transverse incision over the left rectus muscle and then mobilized the peritoneum and left ureter to enter into the retroperitoneum and expose the disc space from the front.  Discussed mobilizing iliac vein and artery.  Discussed risk of injury to the above structures.  Look forward to assisting Dr. Wynetta Emery.  She has had a previous L3-L5 PLIF.   Cephus Shelling, MD [facility] Office: [phone]

## 2022-09-25 NOTE — Pre-Procedure Instructions (Signed)
Surgical Instructions    Your procedure is scheduled on Friday, October 09, 2022 at 7:30 AM.  Report to [facility] Main Entrance "A" at 5:30 A.M., then check in with the Admitting office.  Call this number if you have problems the morning of surgery:  [phone]   If you have any questions prior to your surgery date call [phone]: Open Monday-Friday 8am-4pm  *If you experience any cold or flu symptoms such as cough, fever, chills, shortness of breath, etc. between now and your scheduled surgery, please notify us.*    Remember:  Do not eat or drink after midnight the night before your surgery    Take these medicines the morning of surgery with A SIP OF WATER:  amLODipine (NORVASC)  omeprazole (PRILOSEC)   IF NEEDED: acetaminophen (TYLENOL)  albuterol (VENTOLIN HFA)  clonazePAM (KLONOPIN)  meclizine (ANTIVERT)  traMADol (ULTRAM)   Please bring all inhalers with you the day of surgery.   As of today, STOP taking any Aspirin (unless otherwise instructed by your surgeon) Aleve, Naproxen, Ibuprofen, Motrin, meloxicam (MOBIC), Advil, Goody's, BC's, all herbal medications, fish oil, and all vitamins.                     Do NOT Smoke (Tobacco/Vaping) for 24 hours prior to your procedure.  If you use a CPAP at night, you may bring your mask/headgear for your overnight stay.   Contacts, glasses, piercing's, hearing aid's, dentures or partials may not be worn into surgery, please bring cases for these belongings.    For patients admitted to the hospital, discharge time will be determined by your treatment team.   Patients discharged the day of surgery will not be allowed to drive home, and someone needs to stay with them for 24 hours.  SURGICAL WAITING ROOM VISITATION Patients having surgery or a procedure may have two support people in the waiting area. Visitors may stay in the waiting area during the procedure and switch out with other visitors if needed. Only 1 support  person is allowed in the pre-op area with the patient AFTER the patient is prepped. This person cannot be switched out. Children under the age of 83 must have an adult accompany them who is not the patient. If the patient needs to stay at the hospital during part of their recovery, the visitor guidelines for inpatient rooms apply.  Please refer to the [facility] website for the visitor guidelines for Inpatients (after your surgery is over and you are in a regular room).    Special instructions:   [place]- Preparing For Surgery  Before surgery, you can play an important role. Because skin is not sterile, your skin needs to be as free of germs as possible. You can reduce the number of germs on your skin by washing with CHG (chlorahexidine gluconate) Soap before surgery.  CHG is an antiseptic cleaner which kills germs and bonds with the skin to continue killing germs even after washing.    Oral Hygiene is also important to reduce your risk of infection.  Remember - BRUSH YOUR TEETH THE MORNING OF SURGERY WITH YOUR REGULAR TOOTHPASTE  Please do not use if you have an allergy to CHG or antibacterial soaps. If your skin becomes reddened/irritated stop using the CHG.  Do not shave (including legs and underarms) for at least 48 hours prior to first CHG shower. It is OK to shave your face.  Please follow these instructions carefully.   Shower the [employer]  SURGERY and the MORNING OF SURGERY  If you chose to wash your hair, wash your hair first as usual with your normal shampoo.  After you shampoo, rinse your hair and body thoroughly to remove the shampoo.  Use CHG Soap as you would any other liquid soap. You can apply CHG directly to the skin and wash gently with a scrungie or a clean washcloth.   Apply the CHG Soap to your body ONLY FROM THE NECK DOWN.  Do not use on open wounds or open sores. Avoid contact with your eyes, ears, mouth and genitals (private parts). Wash Face and genitals  (private parts)  with your normal soap.   Wash thoroughly, paying special attention to the area where your surgery will be performed.  Thoroughly rinse your body with warm water from the neck down.  DO NOT shower/wash with your normal soap after using and rinsing off the CHG Soap.  Pat yourself dry with a CLEAN TOWEL.  Wear CLEAN PAJAMAS to bed the night before surgery  Place CLEAN SHEETS on your bed the night before your surgery  DO NOT SLEEP WITH PETS.   Day of Surgery: Take a shower with CHG soap. Do not wear jewelry or makeup Do not wear lotions, powders, perfumes/colognes, or deodorant. Do not shave 48 hours prior to surgery. Do not wear nail polish, gel polish, artificial nails, or any other type of covering on natural nails (fingers and toes) If you have artificial nails or gel coating that need to be removed by a nail salon, please have this removed prior to surgery. Artificial nails or gel coating may interfere with anesthesia's ability to adequately monitor your vital signs. Wear Clean/Comfortable clothing the morning of surgery Do not bring valuables to the hospital.  [facility] is not responsible for any belongings or valuables. Remember to brush your teeth WITH YOUR REGULAR TOOTHPASTE.   Please read over the following fact sheets that you were given.  If you received a COVID test during your pre-op visit  it is requested that you wear a mask when out in public, stay away from anyone that may not be feeling well and notify your surgeon if you develop symptoms. If you have been in contact with anyone that has tested positive in the last 10 days please notify you surgeon.

## 2022-09-27 ENCOUNTER — Other Ambulatory Visit: Payer: Self-pay | Admitting: Internal Medicine

## 2022-09-27 DIAGNOSIS — I10 Essential (primary) hypertension: Secondary | ICD-10-CM

## 2022-09-29 ENCOUNTER — Encounter (HOSPITAL_COMMUNITY): Payer: Self-pay

## 2022-09-29 ENCOUNTER — Other Ambulatory Visit: Payer: Self-pay

## 2022-09-29 ENCOUNTER — Encounter (HOSPITAL_COMMUNITY)
Admission: RE | Admit: 2022-09-29 | Discharge: 2022-09-29 | Disposition: A | Discharge status: Home / Self Care | Payer: Medicare Other | Source: Ambulatory Visit | Attending: Neurosurgery | Admitting: Neurosurgery

## 2022-09-29 VITALS — BP 154/76 | HR 76 | Temp 97.9°F | Resp 18 | Ht 60.0 in | Wt 212.0 lb

## 2022-09-29 DIAGNOSIS — Z01818 Encounter for other preprocedural examination: Secondary | ICD-10-CM | POA: Insufficient documentation

## 2022-09-29 DIAGNOSIS — I10 Essential (primary) hypertension: Secondary | ICD-10-CM | POA: Insufficient documentation

## 2022-09-29 LAB — CBC
HCT: 34.7 % — ABNORMAL LOW (ref 36.0–46.0)
Hemoglobin: 11.2 g/dL — ABNORMAL LOW (ref 12.0–15.0)
MCH: 29.1 pg (ref 26.0–34.0)
MCHC: 32.3 g/dL (ref 30.0–36.0)
MCV: 90.1 fL (ref 80.0–100.0)
Platelets: 320 10*3/uL (ref 150–400)
RBC: 3.85 MIL/uL — ABNORMAL LOW (ref 3.87–5.11)
RDW: 14.6 % (ref 11.5–15.5)
WBC: 7.9 10*3/uL (ref 4.0–10.5)
nRBC: 0 % (ref 0.0–0.2)

## 2022-09-29 LAB — BASIC METABOLIC PANEL
Anion gap: 9 (ref 5–15)
BUN: 15 mg/dL (ref 8–23)
CO2: 28 mmol/L (ref 22–32)
Calcium: 9.2 mg/dL (ref 8.9–10.3)
Chloride: 99 mmol/L (ref 98–111)
Creatinine, Ser: 0.86 mg/dL (ref 0.44–1.00)
GFR, Estimated: >60 mL/min (ref >60)
Glucose, Bld: 99 mg/dL (ref 70–99)
Potassium: 4 mmol/L (ref 3.5–5.1)
Sodium: 136 mmol/L (ref 135–145)

## 2022-09-29 LAB — TYPE AND SCREEN
ABO/RH(D): A POS
Antibody Screen: NEGATIVE

## 2022-09-29 LAB — SURGICAL PCR SCREEN
MRSA, PCR: NEGATIVE
Staphylococcus aureus: NEGATIVE

## 2022-09-29 NOTE — Progress Notes (Signed)
PCP - Bari Edward, MD Cardiologist - denies  PPM/ICD - n/a  Chest x-ray - n/a EKG - 09/29/22 Stress Test - 10+ years ago ECHO - 10+ years ago Cardiac Cath - 2013-CE  Sleep Study - denies CPAP - denies  Last dose of GLP1 agonist-  n/a GLP1 instructions: n/a  Blood Thinner Instructions: n/a Aspirin Instructions: n/a  NPO  COVID TEST- n/a   Anesthesia review: EKG review  Patient denies shortness of breath, fever, cough and chest pain at PAT appointment   All instructions explained to the patient, with a verbal understanding of the material. Patient agrees to go over the instructions while at home for a better understanding. Patient also instructed to self quarantine after being tested for COVID-19. The opportunity to ask questions was provided.

## 2022-10-07 ENCOUNTER — Encounter: Payer: Self-pay | Admitting: Cardiology

## 2022-10-07 ENCOUNTER — Ambulatory Visit: Payer: Medicare Other | Attending: Cardiology | Admitting: Cardiology

## 2022-10-07 VITALS — BP 144/80 | HR 78 | Ht 59.0 in | Wt 210.4 lb

## 2022-10-07 DIAGNOSIS — I10 Essential (primary) hypertension: Secondary | ICD-10-CM | POA: Diagnosis not present

## 2022-10-07 DIAGNOSIS — Z01810 Encounter for preprocedural cardiovascular examination: Secondary | ICD-10-CM | POA: Diagnosis not present

## 2022-10-07 DIAGNOSIS — I447 Left bundle-branch block, unspecified: Secondary | ICD-10-CM | POA: Diagnosis not present

## 2022-10-07 NOTE — Patient Instructions (Signed)
Medication Instructions:   Your physician recommends that you continue on your current medications as directed. Please refer to the Current Medication list given to you today.  *If you need a refill on your cardiac medications before your next appointment, please call your pharmacy*   Lab Work:  None Ordered  If you have labs (blood work) drawn today and your tests are completely normal, you will receive your results only by: MyChart Message (if you have MyChart) OR A paper copy in the mail If you have any lab test that is abnormal or we need to change your treatment, we will call you to review the results.   Testing/Procedures:  Echocardiogram  Your physician has requested that you have an echocardiogram. Echocardiography is a painless test that uses sound waves to create images of your heart. It provides your doctor with information about the size and shape of your heart and how well your heart's chambers and valves are working. This procedure takes approximately one hour. There are no restrictions for this procedure. Please note; depending on visual quality an IV may need to be placed.     Follow-Up: At [facility], you and your health needs are our priority.  As part of our continuing mission to provide you with exceptional heart care, we have created designated Provider Care Teams.  These Care Teams include your primary Cardiologist (physician) and Advanced Practice Providers (APPs -  Physician Assistants and Nurse Practitioners) who all work together to provide you with the care you need, when you need it.  We recommend signing up for the patient portal called "MyChart".  Sign up information is provided on this After Visit Summary.  MyChart is used to connect with patients for Virtual Visits (Telemedicine).  Patients are able to view lab/test results, encounter notes, upcoming appointments, etc.  Non-urgent messages can be sent to your provider as well.   To learn more  about what you can do with MyChart, go to [email or web address].    Your next appointment:   2 month(s)  The format for your next appointment:   In Person  Provider:   You may see Brian Agbor-Etang, MD or one of the following Advanced Practice Providers on your designated Care Team:   Christopher Berge, NP Ryan Dunn, PA-C Cadence Furth, PA-C Sheri Hammock, NP  

## 2022-10-07 NOTE — Progress Notes (Signed)
Anesthesia Chart Review:  Patient noted to have new left bundle branch block on preop testing and was referred for cardiology evaluation.  She was seen by Dr. Garen Lah on 10/07/2022.  Per note, "1. Preop evaluation, denies chest pain or shortness of breath, okay to proceed with surgery from a cardiac perspective.  Tolerated recent back surgery without any issues.  No additional cardiac testing will mitigate patient's risk.  Completely asymptomatic from a cardiac perspective.Marland KitchenMarland KitchenLeft bundle branch block, asymptomatic.  Get echo to rule out any structural abnormalities.  Echo should not delay scheduled surgery in 2 days.  Preop labs reviewed, mild anemia with hemoglobin 11.2, otherwise unremarkable.  EKG 10/07/2022: Sinus rhythm.  Left bundle branch block.  Event monitor 08/19/2021: Patient had a min HR of 57 bpm, max HR of 174 bpm, and avg HR of 83 bpm. Predominant underlying rhythm was Sinus Rhythm.  Occasional paroxysmal supraventricular Tachycardia runs occurred,  Isolated SVEs were rare (<1.0%), SVE Couplets were rare (<1.0%), and SVE Triplets were rare (<1.0%). Isolated VEs were rare (<1.0%, 827). Benign cardiac monitor, no significant arrhythmias.  Carotid duplex 08/18/2021: Summary:  Right Carotid: There is no evidence of stenosis in the right ICA. The extracranial vessels were near-normal with only minimal wall thickening or plaque.  Left Carotid: There is no evidence of stenosis in the left ICA. The extracranial vessels were near-normal with only minimal wall thickening or plaque.  Vertebrals:  Bilateral vertebral arteries demonstrate antegrade flow.  Subclavians: Normal flow hemodynamics were seen in bilateral subclavian arteries.    Wynonia Musty [facility]/Anesthesiology Phone [phone] 10/07/2022 3:26 PM
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
## 2022-10-07 NOTE — Anesthesia Preprocedure Evaluation (Addendum)
Anesthesia Evaluation  Patient identified by MRN, date of birth, ID band Patient awake    Reviewed: Allergy & Precautions, NPO status , Patient's Chart, lab work & pertinent test results  History of Anesthesia Complications Negative for: history of anesthetic complications  Airway Mallampati: II  TM Distance: >3 FB Neck ROM: Full    Dental  (+) Edentulous Upper, Edentulous Lower   Pulmonary asthma , COPD,  COPD inhaler, former smoker   breath sounds clear to auscultation       Cardiovascular hypertension, Pt. on medications (-) angina  Rhythm:Regular Rate:Normal     Neuro/Psych   Anxiety     vertigo    GI/Hepatic Neg liver ROS,GERD  Medicated and Controlled,,  Endo/Other    Morbid obesityBMI 42.5  Renal/GU negative Renal ROS     Musculoskeletal  (+) Arthritis ,    Abdominal  (+) + obese  Peds  Hematology negative hematology ROS (+)   Anesthesia Other Findings   Reproductive/Obstetrics                              Anesthesia Physical Anesthesia Plan  ASA: 3  Anesthesia Plan: General   Post-op Pain Management: Tylenol PO (pre-op)*   Induction: Intravenous  PONV Risk Score and Plan: 3 and Ondansetron, Dexamethasone and Treatment may vary due to age or medical condition  Airway Management Planned: Oral ETT  Additional Equipment: Arterial line  Intra-op Plan:   Post-operative Plan: Extubation in OR  Informed Consent: I have reviewed the patients History and Physical, chart, labs and discussed the procedure including the risks, benefits and alternatives for the proposed anesthesia with the patient or authorized representative who has indicated his/her understanding and acceptance.       Plan Discussed with: CRNA and Surgeon  Anesthesia Plan Comments: (PAT note by Karoline Caldwell, PA-C:  Patient noted to have new left bundle branch block on preop testing and was referred for  cardiology evaluation.  She was seen by Dr. Garen Lah on 10/07/2022.  Per note, "1. Preop evaluation, denies chest pain or shortness of breath, okay to proceed with surgery from a cardiac perspective.  Tolerated recent back surgery without any issues.  No additional cardiac testing will mitigate patient's risk.  Completely asymptomatic from a cardiac perspective.Marland KitchenMarland KitchenLeft bundle branch block, asymptomatic.  Get echo to rule out any structural abnormalities.  Echo should not delay scheduled surgery in 2 days.  Preop labs reviewed, mild anemia with hemoglobin 11.2, otherwise unremarkable.  EKG 10/07/2022: Sinus rhythm.  Left bundle branch block.  Event monitor 08/19/2021: Patient had a min HR of 57 bpm, max HR of 174 bpm, and avg HR of 83 bpm. Predominant underlying rhythm was Sinus Rhythm.  Occasional paroxysmal supraventricular Tachycardia runs occurred,  Isolated SVEs were rare (<1.0%), SVE Couplets were rare (<1.0%), and SVE Triplets were rare (<1.0%). Isolated VEs were rare (<1.0%, 827). Benign cardiac monitor, no significant arrhythmias.  Carotid duplex 08/18/2021: Summary:  Right Carotid: There is no evidence of stenosis in the right ICA. The extracranial vessels were near-normal with only minimal wall thickening or plaque.  Left Carotid: There is no evidence of stenosis in the left ICA. The extracranial vessels were near-normal with only minimal wall thickening or plaque.  Vertebrals:  Bilateral vertebral arteries demonstrate antegrade flow.  Subclavians: Normal flow hemodynamics were seen in bilateral subclavian arteries.   )         Anesthesia Quick Evaluation

## 2022-10-09 ENCOUNTER — Inpatient Hospital Stay (HOSPITAL_COMMUNITY): Payer: Medicare Other

## 2022-10-09 ENCOUNTER — Encounter (HOSPITAL_COMMUNITY)
Admission: RE | Disposition: A | Discharge status: Home / Self Care | Payer: Self-pay | Source: Ambulatory Visit | Attending: Neurosurgery

## 2022-10-09 ENCOUNTER — Other Ambulatory Visit: Payer: Self-pay

## 2022-10-09 ENCOUNTER — Inpatient Hospital Stay (HOSPITAL_COMMUNITY): Payer: Medicare Other | Admitting: Physician Assistant

## 2022-10-09 ENCOUNTER — Inpatient Hospital Stay (HOSPITAL_COMMUNITY)
Admission: RE | Admit: 2022-10-09 | Discharge: 2022-10-10 | DRG: 454 | Disposition: A | Discharge status: Home / Self Care | Payer: Medicare Other | Source: Ambulatory Visit | Attending: Neurosurgery | Admitting: Neurosurgery

## 2022-10-09 ENCOUNTER — Encounter (HOSPITAL_COMMUNITY): Payer: Self-pay | Admitting: Neurosurgery

## 2022-10-09 DIAGNOSIS — Z87891 Personal history of nicotine dependence: Secondary | ICD-10-CM | POA: Diagnosis not present

## 2022-10-09 DIAGNOSIS — M4317 Spondylolisthesis, lumbosacral region: Secondary | ICD-10-CM | POA: Diagnosis not present

## 2022-10-09 DIAGNOSIS — Z79899 Other long term (current) drug therapy: Secondary | ICD-10-CM

## 2022-10-09 DIAGNOSIS — Z96642 Presence of left artificial hip joint: Secondary | ICD-10-CM | POA: Diagnosis not present

## 2022-10-09 DIAGNOSIS — M48061 Spinal stenosis, lumbar region without neurogenic claudication: Secondary | ICD-10-CM | POA: Diagnosis present

## 2022-10-09 DIAGNOSIS — J449 Chronic obstructive pulmonary disease, unspecified: Secondary | ICD-10-CM | POA: Diagnosis not present

## 2022-10-09 DIAGNOSIS — Z471 Aftercare following joint replacement surgery: Secondary | ICD-10-CM | POA: Diagnosis not present

## 2022-10-09 DIAGNOSIS — Z981 Arthrodesis status: Secondary | ICD-10-CM | POA: Diagnosis not present

## 2022-10-09 DIAGNOSIS — F419 Anxiety disorder, unspecified: Secondary | ICD-10-CM | POA: Diagnosis not present

## 2022-10-09 DIAGNOSIS — Z882 Allergy status to sulfonamides status: Secondary | ICD-10-CM | POA: Diagnosis not present

## 2022-10-09 DIAGNOSIS — G8929 Other chronic pain: Secondary | ICD-10-CM | POA: Diagnosis present

## 2022-10-09 DIAGNOSIS — Z8249 Family history of ischemic heart disease and other diseases of the circulatory system: Secondary | ICD-10-CM | POA: Diagnosis not present

## 2022-10-09 DIAGNOSIS — I700 Atherosclerosis of aorta: Secondary | ICD-10-CM | POA: Diagnosis not present

## 2022-10-09 DIAGNOSIS — H8101 Meniere's disease, right ear: Secondary | ICD-10-CM | POA: Diagnosis present

## 2022-10-09 DIAGNOSIS — M5117 Intervertebral disc disorders with radiculopathy, lumbosacral region: Principal | ICD-10-CM | POA: Diagnosis present

## 2022-10-09 DIAGNOSIS — M4807 Spinal stenosis, lumbosacral region: Secondary | ICD-10-CM | POA: Diagnosis present

## 2022-10-09 DIAGNOSIS — M5116 Intervertebral disc disorders with radiculopathy, lumbar region: Secondary | ICD-10-CM | POA: Diagnosis present

## 2022-10-09 DIAGNOSIS — M5417 Radiculopathy, lumbosacral region: Secondary | ICD-10-CM | POA: Diagnosis not present

## 2022-10-09 DIAGNOSIS — M5416 Radiculopathy, lumbar region: Secondary | ICD-10-CM | POA: Diagnosis not present

## 2022-10-09 DIAGNOSIS — I10 Essential (primary) hypertension: Secondary | ICD-10-CM

## 2022-10-09 DIAGNOSIS — Z91014 Allergy to mammalian meats: Secondary | ICD-10-CM

## 2022-10-09 DIAGNOSIS — Z823 Family history of stroke: Secondary | ICD-10-CM | POA: Diagnosis not present

## 2022-10-09 DIAGNOSIS — Z809 Family history of malignant neoplasm, unspecified: Secondary | ICD-10-CM | POA: Diagnosis not present

## 2022-10-09 DIAGNOSIS — Z885 Allergy status to narcotic agent status: Secondary | ICD-10-CM

## 2022-10-09 DIAGNOSIS — J45909 Unspecified asthma, uncomplicated: Secondary | ICD-10-CM | POA: Diagnosis not present

## 2022-10-09 DIAGNOSIS — Z881 Allergy status to other antibiotic agents status: Secondary | ICD-10-CM

## 2022-10-09 DIAGNOSIS — Z6841 Body Mass Index (BMI) 40.0 and over, adult: Secondary | ICD-10-CM

## 2022-10-09 DIAGNOSIS — M5136 Other intervertebral disc degeneration, lumbar region: Principal | ICD-10-CM | POA: Diagnosis present

## 2022-10-09 DIAGNOSIS — K219 Gastro-esophageal reflux disease without esophagitis: Secondary | ICD-10-CM | POA: Diagnosis present

## 2022-10-09 DIAGNOSIS — M545 Low back pain, unspecified: Secondary | ICD-10-CM | POA: Diagnosis present

## 2022-10-09 DIAGNOSIS — G2581 Restless legs syndrome: Secondary | ICD-10-CM | POA: Diagnosis not present

## 2022-10-09 DIAGNOSIS — Z825 Family history of asthma and other chronic lower respiratory diseases: Secondary | ICD-10-CM

## 2022-10-09 HISTORY — PX: LAMINECTOMY WITH POSTERIOR LATERAL ARTHRODESIS LEVEL 2: SHX6336

## 2022-10-09 HISTORY — PX: ABDOMINAL EXPOSURE: SHX5708

## 2022-10-09 HISTORY — PX: ANTERIOR LUMBAR FUSION: SHX1170

## 2022-10-09 SURGERY — ANTERIOR LUMBAR FUSION 1 LEVEL
Anesthesia: General | Site: Spine Lumbar

## 2022-10-09 MED ORDER — TRAMADOL HCL 50 MG PO TABS: 50.0000 mg | ORAL_TABLET | Freq: Four times a day (QID) | ORAL | Status: DC | PRN

## 2022-10-09 MED ORDER — FENTANYL CITRATE (PF) 100 MCG/2ML IJ SOLN
INTRAMUSCULAR | Status: AC
  Filled 2022-10-09: qty 2

## 2022-10-09 MED ORDER — ALBUMIN HUMAN 5 % IV SOLN: INTRAVENOUS | Status: DC | PRN

## 2022-10-09 MED ORDER — ONDANSETRON HCL 4 MG/2ML IJ SOLN: 4.0000 mg | Freq: Four times a day (QID) | INTRAMUSCULAR | Status: DC | PRN

## 2022-10-09 MED ORDER — CHLORHEXIDINE GLUCONATE CLOTH 2 % EX PADS: 6.0000 | MEDICATED_PAD | Freq: Once | CUTANEOUS | Status: DC

## 2022-10-09 MED ORDER — LACTATED RINGERS IV SOLN: INTRAVENOUS | Status: DC

## 2022-10-09 MED ORDER — LIDOCAINE 2% (20 MG/ML) 5 ML SYRINGE
INTRAMUSCULAR | Status: AC
  Filled 2022-10-09: qty 5

## 2022-10-09 MED ORDER — ACETAMINOPHEN 500 MG PO TABS: 1000.0000 mg | ORAL_TABLET | Freq: Four times a day (QID) | ORAL | Status: DC | PRN

## 2022-10-09 MED ORDER — MECLIZINE HCL 12.5 MG PO TABS: 12.5000 mg | ORAL_TABLET | Freq: Three times a day (TID) | ORAL | Status: DC | PRN

## 2022-10-09 MED ORDER — DEXAMETHASONE SODIUM PHOSPHATE 10 MG/ML IJ SOLN
INTRAMUSCULAR | Status: DC | PRN
  Administered 2022-10-09: 5 mg via INTRAVENOUS

## 2022-10-09 MED ORDER — ONDANSETRON HCL 4 MG/2ML IJ SOLN
INTRAMUSCULAR | Status: DC | PRN
  Administered 2022-10-09: 4 mg via INTRAVENOUS

## 2022-10-09 MED ORDER — CEFAZOLIN SODIUM-DEXTROSE 2-4 GM/100ML-% IV SOLN
INTRAVENOUS | Status: AC
  Filled 2022-10-09: qty 100

## 2022-10-09 MED ORDER — LACTATED RINGERS IV SOLN: INTRAVENOUS | Status: DC | PRN

## 2022-10-09 MED ORDER — CEFAZOLIN SODIUM-DEXTROSE 2-4 GM/100ML-% IV SOLN
2.0000 g | Freq: Three times a day (TID) | INTRAVENOUS | Status: AC
  Administered 2022-10-09 (×2): 2 g via INTRAVENOUS
  Filled 2022-10-09 (×2): qty 100

## 2022-10-09 MED ORDER — VITAMIN D 25 MCG (1000 UNIT) PO TABS
1000.0000 [IU] | ORAL_TABLET | Freq: Every day | ORAL | Status: DC
  Administered 2022-10-09: 1000 [IU] via ORAL
  Filled 2022-10-09: qty 1

## 2022-10-09 MED ORDER — SODIUM CHLORIDE 0.9 % IV SOLN
250.0000 mL | INTRAVENOUS | Status: DC
  Administered 2022-10-09: 250 mL via INTRAVENOUS

## 2022-10-09 MED ORDER — ROCURONIUM BROMIDE 10 MG/ML (PF) SYRINGE
PREFILLED_SYRINGE | INTRAVENOUS | Status: DC | PRN
  Administered 2022-10-09: 20 mg via INTRAVENOUS
  Administered 2022-10-09: 10 mg via INTRAVENOUS
  Administered 2022-10-09: 30 mg via INTRAVENOUS
  Administered 2022-10-09: 10 mg via INTRAVENOUS
  Administered 2022-10-09: 30 mg via INTRAVENOUS
  Administered 2022-10-09: 40 mg via INTRAVENOUS

## 2022-10-09 MED ORDER — BUPIVACAINE LIPOSOME 1.3 % IJ SUSP
INTRAMUSCULAR | Status: DC | PRN
  Administered 2022-10-09: 20 mL

## 2022-10-09 MED ORDER — FENTANYL CITRATE (PF) 100 MCG/2ML IJ SOLN
25.0000 ug | INTRAMUSCULAR | Status: DC | PRN
  Administered 2022-10-09 (×2): 50 ug via INTRAVENOUS

## 2022-10-09 MED ORDER — 0.9 % SODIUM CHLORIDE (POUR BTL) OPTIME
TOPICAL | Status: DC | PRN
  Administered 2022-10-09 (×3): 1000 mL

## 2022-10-09 MED ORDER — PHENOL 1.4 % MT LIQD
1.0000 | OROMUCOSAL | Status: DC | PRN
  Administered 2022-10-10: 1 via OROMUCOSAL
  Filled 2022-10-09: qty 177

## 2022-10-09 MED ORDER — RISAQUAD PO CAPS
1.0000 | ORAL_CAPSULE | Freq: Every day | ORAL | Status: DC
  Administered 2022-10-10: 1 via ORAL
  Filled 2022-10-09: qty 1

## 2022-10-09 MED ORDER — KETOROLAC TROMETHAMINE 0.5 % OP SOLN
1.0000 [drp] | Freq: Four times a day (QID) | OPHTHALMIC | Status: DC
  Administered 2022-10-09 – 2022-10-10 (×4): 1 [drp] via OPHTHALMIC
  Filled 2022-10-09: qty 5

## 2022-10-09 MED ORDER — PANTOPRAZOLE SODIUM 40 MG PO TBEC
40.0000 mg | DELAYED_RELEASE_TABLET | Freq: Every day | ORAL | Status: DC
  Administered 2022-10-09: 40 mg via ORAL
  Filled 2022-10-09: qty 1

## 2022-10-09 MED ORDER — AMLODIPINE BESYLATE 5 MG PO TABS
5.0000 mg | ORAL_TABLET | Freq: Every day | ORAL | Status: DC
  Filled 2022-10-09: qty 1

## 2022-10-09 MED ORDER — ACETAMINOPHEN 500 MG PO TABS
ORAL_TABLET | ORAL | Status: AC
  Filled 2022-10-09: qty 2

## 2022-10-09 MED ORDER — PANTOPRAZOLE SODIUM 40 MG PO TBEC: 40.0000 mg | DELAYED_RELEASE_TABLET | Freq: Every day | ORAL | Status: DC

## 2022-10-09 MED ORDER — THROMBIN 20000 UNITS EX SOLR
CUTANEOUS | Status: AC
  Filled 2022-10-09: qty 20000

## 2022-10-09 MED ORDER — GABAPENTIN 300 MG PO CAPS
600.0000 mg | ORAL_CAPSULE | Freq: Every day | ORAL | Status: DC
  Administered 2022-10-09: 600 mg via ORAL
  Filled 2022-10-09: qty 2

## 2022-10-09 MED ORDER — CHLORHEXIDINE GLUCONATE 0.12 % MT SOLN
15.0000 mL | Freq: Once | OROMUCOSAL | Status: AC
  Administered 2022-10-09: 15 mL via OROMUCOSAL

## 2022-10-09 MED ORDER — LIDOCAINE-EPINEPHRINE 1 %-1:100000 IJ SOLN
INTRAMUSCULAR | Status: DC | PRN
  Administered 2022-10-09: 10 mL

## 2022-10-09 MED ORDER — ROCURONIUM BROMIDE 10 MG/ML (PF) SYRINGE
PREFILLED_SYRINGE | INTRAVENOUS | Status: AC
  Filled 2022-10-09: qty 10

## 2022-10-09 MED ORDER — ACETAMINOPHEN 500 MG PO TABS
1000.0000 mg | ORAL_TABLET | Freq: Once | ORAL | Status: AC
  Administered 2022-10-09: 1000 mg via ORAL

## 2022-10-09 MED ORDER — THROMBIN 20000 UNITS EX SOLR: CUTANEOUS | Status: DC | PRN

## 2022-10-09 MED ORDER — LIDOCAINE-EPINEPHRINE 1 %-1:100000 IJ SOLN
INTRAMUSCULAR | Status: AC
  Filled 2022-10-09: qty 1

## 2022-10-09 MED ORDER — ACETAMINOPHEN 650 MG RE SUPP: 650.0000 mg | RECTAL | Status: DC | PRN

## 2022-10-09 MED ORDER — HYDROCODONE-ACETAMINOPHEN 5-325 MG PO TABS
2.0000 | ORAL_TABLET | ORAL | Status: DC | PRN
  Administered 2022-10-09: 2 via ORAL
  Filled 2022-10-09: qty 2

## 2022-10-09 MED ORDER — CHLORHEXIDINE GLUCONATE 0.12 % MT SOLN
OROMUCOSAL | Status: AC
  Filled 2022-10-09: qty 15

## 2022-10-09 MED ORDER — DEXAMETHASONE SODIUM PHOSPHATE 10 MG/ML IJ SOLN
INTRAMUSCULAR | Status: AC
  Filled 2022-10-09: qty 1

## 2022-10-09 MED ORDER — ACETAMINOPHEN 325 MG PO TABS: 650.0000 mg | ORAL_TABLET | ORAL | Status: DC | PRN

## 2022-10-09 MED ORDER — KETOROLAC TROMETHAMINE 0.5 % OP SOLN
OPHTHALMIC | Status: AC
  Filled 2022-10-09: qty 5

## 2022-10-09 MED ORDER — FENTANYL CITRATE (PF) 250 MCG/5ML IJ SOLN
INTRAMUSCULAR | Status: AC
  Filled 2022-10-09: qty 5

## 2022-10-09 MED ORDER — SODIUM CHLORIDE 0.9% FLUSH: 3.0000 mL | INTRAVENOUS | Status: DC | PRN

## 2022-10-09 MED ORDER — PANTOPRAZOLE SODIUM 40 MG IV SOLR: 40.0000 mg | Freq: Every day | INTRAVENOUS | Status: DC

## 2022-10-09 MED ORDER — MENTHOL 3 MG MT LOZG
1.0000 | LOZENGE | OROMUCOSAL | Status: DC | PRN
  Filled 2022-10-09: qty 9

## 2022-10-09 MED ORDER — PROPOFOL 500 MG/50ML IV EMUL
INTRAVENOUS | Status: DC | PRN
  Administered 2022-10-09: 40 ug/kg/min via INTRAVENOUS

## 2022-10-09 MED ORDER — ONDANSETRON HCL 4 MG PO TABS: 4.0000 mg | ORAL_TABLET | Freq: Four times a day (QID) | ORAL | Status: DC | PRN

## 2022-10-09 MED ORDER — LISINOPRIL 20 MG PO TABS
40.0000 mg | ORAL_TABLET | Freq: Every day | ORAL | Status: DC
  Administered 2022-10-09: 40 mg via ORAL
  Filled 2022-10-09 (×2): qty 2
  Filled 2022-10-09: qty 1

## 2022-10-09 MED ORDER — HYDROMORPHONE HCL 1 MG/ML IJ SOLN
INTRAMUSCULAR | Status: AC
  Filled 2022-10-09: qty 1

## 2022-10-09 MED ORDER — ALUM & MAG HYDROXIDE-SIMETH 200-200-20 MG/5ML PO SUSP
30.0000 mL | Freq: Four times a day (QID) | ORAL | Status: DC | PRN
  Administered 2022-10-10: 30 mL via ORAL
  Filled 2022-10-09: qty 30

## 2022-10-09 MED ORDER — ONDANSETRON HCL 4 MG/2ML IJ SOLN
INTRAMUSCULAR | Status: AC
  Filled 2022-10-09: qty 2

## 2022-10-09 MED ORDER — ACETAMINOPHEN-CODEINE #3 300-30 MG PO TABS
1.0000 | ORAL_TABLET | ORAL | Status: DC | PRN
  Administered 2022-10-10 (×2): 2 via ORAL
  Filled 2022-10-09 (×2): qty 2

## 2022-10-09 MED ORDER — CLONAZEPAM 0.5 MG PO TABS: 0.5000 mg | ORAL_TABLET | Freq: Two times a day (BID) | ORAL | Status: DC | PRN

## 2022-10-09 MED ORDER — CEFAZOLIN SODIUM-DEXTROSE 2-4 GM/100ML-% IV SOLN
2.0000 g | INTRAVENOUS | Status: AC
  Administered 2022-10-09: 2 g via INTRAVENOUS

## 2022-10-09 MED ORDER — BUPIVACAINE LIPOSOME 1.3 % IJ SUSP
INTRAMUSCULAR | Status: AC
  Filled 2022-10-09: qty 20

## 2022-10-09 MED ORDER — HYDROMORPHONE HCL 1 MG/ML IJ SOLN
0.5000 mg | INTRAMUSCULAR | Status: DC | PRN
  Administered 2022-10-09: 0.5 mg via INTRAVENOUS

## 2022-10-09 MED ORDER — ALUM HYDROXIDE-MAG TRISILICATE 80-20 MG PO CHEW: 1.0000 | CHEWABLE_TABLET | Freq: Every day | ORAL | Status: DC | PRN

## 2022-10-09 MED ORDER — ALBUTEROL SULFATE (2.5 MG/3ML) 0.083% IN NEBU: 2.5000 mg | INHALATION_SOLUTION | Freq: Four times a day (QID) | RESPIRATORY_TRACT | Status: DC | PRN

## 2022-10-09 MED ORDER — PROPOFOL 10 MG/ML IV BOLUS
INTRAVENOUS | Status: AC
  Filled 2022-10-09: qty 20

## 2022-10-09 MED ORDER — ORAL CARE MOUTH RINSE: 15.0000 mL | Freq: Once | OROMUCOSAL | Status: AC

## 2022-10-09 MED ORDER — LIDOCAINE 2% (20 MG/ML) 5 ML SYRINGE
INTRAMUSCULAR | Status: DC | PRN
  Administered 2022-10-09: 20 mg via INTRAVENOUS

## 2022-10-09 MED ORDER — SODIUM CHLORIDE 0.9% FLUSH
3.0000 mL | Freq: Two times a day (BID) | INTRAVENOUS | Status: DC
  Administered 2022-10-09 – 2022-10-10 (×2): 3 mL via INTRAVENOUS

## 2022-10-09 MED ORDER — SUGAMMADEX SODIUM 200 MG/2ML IV SOLN
INTRAVENOUS | Status: DC | PRN
  Administered 2022-10-09: 200 mg via INTRAVENOUS

## 2022-10-09 MED ORDER — PHENYLEPHRINE 80 MCG/ML (10ML) SYRINGE FOR IV PUSH (FOR BLOOD PRESSURE SUPPORT)
PREFILLED_SYRINGE | INTRAVENOUS | Status: AC
  Filled 2022-10-09: qty 10

## 2022-10-09 MED ORDER — HYDROCHLOROTHIAZIDE 25 MG PO TABS
25.0000 mg | ORAL_TABLET | Freq: Every morning | ORAL | Status: DC
  Filled 2022-10-09: qty 1

## 2022-10-09 MED ORDER — ADULT MULTIVITAMIN W/MINERALS CH
1.0000 | ORAL_TABLET | Freq: Every day | ORAL | Status: DC
  Administered 2022-10-10: 1 via ORAL
  Filled 2022-10-09: qty 1

## 2022-10-09 MED ORDER — FENTANYL CITRATE (PF) 250 MCG/5ML IJ SOLN
INTRAMUSCULAR | Status: DC | PRN
  Administered 2022-10-09 (×4): 50 ug via INTRAVENOUS
  Administered 2022-10-09: 250 ug via INTRAVENOUS
  Administered 2022-10-09: 50 ug via INTRAVENOUS

## 2022-10-09 MED ORDER — ZOLPIDEM TARTRATE 5 MG PO TABS
5.0000 mg | ORAL_TABLET | Freq: Every day | ORAL | Status: DC
  Administered 2022-10-09: 5 mg via ORAL
  Filled 2022-10-09: qty 1

## 2022-10-09 MED ORDER — PROPOFOL 10 MG/ML IV BOLUS
INTRAVENOUS | Status: DC | PRN
  Administered 2022-10-09: 30 mg via INTRAVENOUS
  Administered 2022-10-09: 140 mg via INTRAVENOUS

## 2022-10-09 MED ORDER — BUPIVACAINE HCL (PF) 0.25 % IJ SOLN
INTRAMUSCULAR | Status: AC
  Filled 2022-10-09: qty 30

## 2022-10-09 MED ORDER — PHENYLEPHRINE HCL-NACL 20-0.9 MG/250ML-% IV SOLN
INTRAVENOUS | Status: DC | PRN
  Administered 2022-10-09: 25 ug/min via INTRAVENOUS

## 2022-10-09 MED ORDER — CYCLOBENZAPRINE HCL 10 MG PO TABS: 10.0000 mg | ORAL_TABLET | Freq: Three times a day (TID) | ORAL | Status: DC | PRN

## 2022-10-09 SURGICAL SUPPLY — 101 items
ANCHOR LUMBAR 25 MIS (Anchor) IMPLANT
APPLIER CLIP 11 MED OPEN (CLIP) ×6
BAG COUNTER SPONGE SURGICOUNT (BAG) ×9 IMPLANT
BENZOIN TINCTURE PRP APPL 2/3 (GAUZE/BANDAGES/DRESSINGS) ×3 IMPLANT
BLADE CLIPPER SURG (BLADE) IMPLANT
BLADE SURG 11 STRL SS (BLADE) ×3 IMPLANT
BONE MATRIX OSTEOCEL PRO MED (Bone Implant) IMPLANT
BONE VIVIGEN FORMABLE 5.4CC (Bone Implant) ×3 IMPLANT
BUR MATCHSTICK NEURO 3.0 LAGG (BURR) ×3 IMPLANT
BUR PRECISION FLUTE 6.0 (BURR) ×3 IMPLANT
CANISTER SUCT 3000ML PPV (MISCELLANEOUS) ×6 IMPLANT
CAP LOCKING THREADED (Cap) IMPLANT
CLIP APPLIE 11 MED OPEN (CLIP) ×6 IMPLANT
CLIP LIGATING EXTRA MED SLVR (CLIP) IMPLANT
CNTNR URN SCR LID CUP LEK RST (MISCELLANEOUS) ×3 IMPLANT
CONT SPEC 4OZ STRL OR WHT (MISCELLANEOUS) ×3
COVER BACK TABLE 24X17X13 BIG (DRAPES) IMPLANT
COVER BACK TABLE 60X90IN (DRAPES) ×6 IMPLANT
DERMABOND ADVANCED .7 DNX12 (GAUZE/BANDAGES/DRESSINGS) ×6 IMPLANT
DRAPE C-ARM 42X72 X-RAY (DRAPES) ×9 IMPLANT
DRAPE C-ARMOR (DRAPES) ×3 IMPLANT
DRAPE HALF SHEET 40X57 (DRAPES) IMPLANT
DRAPE LAPAROTOMY 100X72X124 (DRAPES) ×6 IMPLANT
DRAPE POUCH INSTRU U-SHP 10X18 (DRAPES) ×3 IMPLANT
DRAPE SURG 17X23 STRL (DRAPES) ×3 IMPLANT
DRSG OPSITE 4X5.5 SM (GAUZE/BANDAGES/DRESSINGS) IMPLANT
DRSG OPSITE POSTOP 4X6 (GAUZE/BANDAGES/DRESSINGS) ×3 IMPLANT
DRSG OPSITE POSTOP 4X8 (GAUZE/BANDAGES/DRESSINGS) IMPLANT
DURAPREP 26ML APPLICATOR (WOUND CARE) ×3 IMPLANT
ELECT BLADE 4.0 EZ CLEAN MEGAD (MISCELLANEOUS) ×3
ELECT BLADE 6.5 EXT (BLADE) ×3 IMPLANT
ELECT REM PT RETURN 9FT ADLT (ELECTROSURGICAL) ×6
ELECTRODE BLDE 4.0 EZ CLN MEGD (MISCELLANEOUS) ×3 IMPLANT
ELECTRODE REM PT RTRN 9FT ADLT (ELECTROSURGICAL) ×6 IMPLANT
EVACUATOR 1/8 PVC DRAIN (DRAIN) IMPLANT
EVACUATOR 3/16  PVC DRAIN (DRAIN) ×3
EVACUATOR 3/16 PVC DRAIN (DRAIN) ×3 IMPLANT
GAUZE 4X4 16PLY ~~LOC~~+RFID DBL (SPONGE) IMPLANT
GAUZE SPONGE 4X4 12PLY STRL (GAUZE/BANDAGES/DRESSINGS) ×6 IMPLANT
GLOVE BIO SURGEON STRL SZ7 (GLOVE) IMPLANT
GLOVE BIO SURGEON STRL SZ7.5 (GLOVE) ×3 IMPLANT
GLOVE BIO SURGEON STRL SZ8 (GLOVE) ×9 IMPLANT
GLOVE BIOGEL PI IND STRL 7.0 (GLOVE) IMPLANT
GLOVE BIOGEL PI IND STRL 8 (GLOVE) ×3 IMPLANT
GLOVE EXAM NITRILE XL STR (GLOVE) IMPLANT
GLOVE INDICATOR 8.5 STRL (GLOVE) ×18 IMPLANT
GOWN STRL REUS W/ TWL LRG LVL3 (GOWN DISPOSABLE) IMPLANT
GOWN STRL REUS W/ TWL XL LVL3 (GOWN DISPOSABLE) ×12 IMPLANT
GOWN STRL REUS W/TWL 2XL LVL3 (GOWN DISPOSABLE) ×3 IMPLANT
GOWN STRL REUS W/TWL LRG LVL3 (GOWN DISPOSABLE)
GOWN STRL REUS W/TWL XL LVL3 (GOWN DISPOSABLE) ×12
GRAFT BNE MATRIX VG FRMBL MD 5 (Bone Implant) IMPLANT
GRAFT BONE PROTEIOS LRG 5CC (Orthopedic Implant) IMPLANT
INSERT FOGARTY 61MM (MISCELLANEOUS) IMPLANT
INSERT FOGARTY SM (MISCELLANEOUS) IMPLANT
KIT BASIN OR (CUSTOM PROCEDURE TRAY) ×6 IMPLANT
KIT TURNOVER KIT B (KITS) ×3 IMPLANT
MARKER SKIN DUAL TIP RULER LAB (MISCELLANEOUS) IMPLANT
NDL HYPO 25GX1X1/2 BEV (NEEDLE) IMPLANT
NDL HYPO 25X1 1.5 SAFETY (NEEDLE) ×3 IMPLANT
NDL SPNL 18GX3.5 QUINCKE PK (NEEDLE) ×3 IMPLANT
NEEDLE HYPO 25GX1X1/2 BEV (NEEDLE) ×3 IMPLANT
NEEDLE HYPO 25X1 1.5 SAFETY (NEEDLE) ×3 IMPLANT
NEEDLE SPNL 18GX3.5 QUINCKE PK (NEEDLE) ×3 IMPLANT
NS IRRIG 1000ML POUR BTL (IV SOLUTION) ×6 IMPLANT
PACK LAMINECTOMY NEURO (CUSTOM PROCEDURE TRAY) ×6 IMPLANT
PAD ARMBOARD 7.5X6 YLW CONV (MISCELLANEOUS) ×15 IMPLANT
ROD CREO 100MM SPINAL (Rod) IMPLANT
ROD SPINAL THRD DISP (MISCELLANEOUS) IMPLANT
SCREW PA THRD CREO TULIP 5.5X4 (Head) IMPLANT
SHAFT CREO 30MM (Neuro Prosthesis/Implant) IMPLANT
SPACER HEDRON 26X34X13 15D (Spacer) IMPLANT
SPIKE FLUID TRANSFER (MISCELLANEOUS) ×3 IMPLANT
SPONGE INTESTINAL PEANUT (DISPOSABLE) ×12 IMPLANT
SPONGE SURGIFOAM ABS GEL 100 (HEMOSTASIS) ×9 IMPLANT
SPONGE T-LAP 18X18 ~~LOC~~+RFID (SPONGE) ×3 IMPLANT
SPONGE T-LAP 4X18 ~~LOC~~+RFID (SPONGE) IMPLANT
STAPLER VISISTAT 35W (STAPLE) IMPLANT
STRIP CLOSURE SKIN 1/2X4 (GAUZE/BANDAGES/DRESSINGS) ×6 IMPLANT
SUT PDS AB 1 CTX 36 (SUTURE) IMPLANT
SUT PROLENE 4 0 RB 1 (SUTURE)
SUT PROLENE 4-0 RB1 .5 CRCL 36 (SUTURE) IMPLANT
SUT PROLENE 5 0 CC1 (SUTURE) IMPLANT
SUT PROLENE 6 0 C 1 30 (SUTURE) IMPLANT
SUT PROLENE 6 0 CC (SUTURE) IMPLANT
SUT SILK 0 TIES 10X30 (SUTURE) IMPLANT
SUT SILK 2 0 TIES 10X30 (SUTURE) ×3 IMPLANT
SUT SILK 2 0SH CR/8 30 (SUTURE) IMPLANT
SUT SILK 3 0 SH CR/8 (SUTURE) IMPLANT
SUT SILK 3 0 TIES 17X18 (SUTURE)
SUT SILK 3 0SH CR/8 30 (SUTURE) IMPLANT
SUT SILK 3-0 18XBRD TIE BLK (SUTURE) IMPLANT
SUT VIC AB 0 CT1 18XCR BRD8 (SUTURE) ×6 IMPLANT
SUT VIC AB 0 CT1 8-18 (SUTURE) ×9
SUT VIC AB 2-0 CT1 18 (SUTURE) ×6 IMPLANT
SUT VIC AB 4-0 PS2 27 (SUTURE) ×3 IMPLANT
SUT VICRYL 4-0 PS2 18IN ABS (SUTURE) ×3 IMPLANT
TOWEL GREEN STERILE (TOWEL DISPOSABLE) ×9 IMPLANT
TOWEL GREEN STERILE FF (TOWEL DISPOSABLE) ×6 IMPLANT
TRAY FOLEY MTR SLVR 16FR STAT (SET/KITS/TRAYS/PACK) ×6 IMPLANT
WATER STERILE IRR 1000ML POUR (IV SOLUTION) ×6 IMPLANT

## 2022-10-09 NOTE — Op Note (Addendum)
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  And lumbar spinal stenosis foraminal stenosis lateral recess stenosis L5-S1 on the right  Postoperative diagnosis: Same.  Procedure: #1 anterior lumbar interbody fusion utilizing the globus integrated cage packed with the Osteocel pro.  2.  After repositioning posterior lumbar fusion with placement of bilateral S1 cortical screws removal of hardware L3-L5 and connecting long rod L3-S1 with a Creo amp modular cortical screw set.  3.  Right-sided laminotomy and foraminotomies of the L5 and S1 nerve roots.  4.  Posterior lateral arthrodesis L5-S1 utilizing locally harvested autograft mixed with Vivigen and Proteus.  Surgeon: Kary Kos.  Co-surgeon: Dr. Monica Martinez   Assistant: Duffy Rhody.  EBL: 250.  Anesthesia: General  HPI: 77 year old female with previous L3-L5 fusion did very well however come months after the surgery started experiencing worsening right L5 radicular symptoms responded well to transforaminal epidurals however imaging showed progressive spondylosis and collapse and degeneration L5-S1 with incompetence of the facet joints.  Due to her progression of clinical syndrome imaging findings of a conservative treatment I recommended right-sided laminotomy and interbody fusion L5-S1 and I recommended anterior approach for the interbody fusion with posterior cortical screw augmentation and laminotomy.  I extensively went over the risks and benefits of the operation with her as well as perioperative course expectations of outcome and alternatives of surgery and she understand and agreed to proceed forward.  Operative operative procedure: For the for stage of the procedure Dr. Monica Martinez performed the exposure and will dictate the exposure and separate operative note but after adequate localization of the L5-S1 disc base was achieved disc base was incised and cleaned out  radically pituitary rongeurs and Kerrison rongeurs.  Utilizing fluoroscopy I confirmed depth to be adequate at the posterior margin of the endplates sized up a medium 13 mm tall 15 degree implant packed with the Osteocel pro and inserted it and then engaged that Morton as well.  All this was all done under fluoroscopy and confirmed good position the implant.  Retractor was removed meticulous hemostasis was maintained postop imaging confirmed good position and also no retained instruments or sponges.  The wound was then closed with a one #1 PDS in the fascia and subcutaneous Vicryl's and a running 4-0 subcuticular.  Patient was then repositioned prone on the Wilson frame for the second stage of the procedure.  Her old incision after being prepped and draped it was opened up and extended slightly inferiorly scar tissue was dissected free exposing the hardware from L3-L5 I then suppose the L5-S1 disc base performed a laminotomy on the right and pleat facetectomy and right foraminotomies of the L5 and S1 nerve roots on the right there was marked spur coming off the facet joint, projecting superiorly underneath the fibroid this was all removed decompressing the foramen and the S1 foramen was skeletonized flush the pedicle.  Then under AP and lateral fluoroscopy I placed 2 new cortical screws at S1 all screws had excellent purchase.  I then disconnected the hardware the fusion at 3-5 appear to be solid but I aggressively decorticated the TP at L5 and the facet joints at L5-S1 as well as the sacral ala packed and extensive mount of autograft as well as Vivigen and Proteus posterior laterally from L5-S1 then added screw heads lined everything up anchored all the rods and placed placed Gelfoam over the dura injected Exparel in the fascia placed a medium Hemovac drain and closed the wound in layers  with interrupted Vicryl and a running 4 subcuticular.  Dermabond benzoin Steri-Strips and a sterile dressing was applied patient  recovery in stable condition.  At the end the case all needle count sponge counts were correct.

## 2022-10-09 NOTE — Anesthesia Procedure Notes (Signed)
Procedure Name: Intubation Date/Time: 10/09/2022 7:49 AM  Performed by: Glynda Jaeger, CRNAPre-anesthesia Checklist: Patient identified, Patient being monitored, Timeout performed, Emergency Drugs available and Suction available Patient Re-evaluated:Patient Re-evaluated prior to induction Oxygen Delivery Method: Circle System Utilized Preoxygenation: Pre-oxygenation with 100% oxygen Induction Type: IV induction Ventilation: Mask ventilation without difficulty Laryngoscope Size: Mac and 3 Grade View: Grade I Tube type: Oral Tube size: 7.5 mm Number of attempts: 1 Airway Equipment and Method: Stylet Placement Confirmation: ETT inserted through vocal cords under direct vision, positive ETCO2 and breath sounds checked- equal and bilateral Secured at: 22 cm Tube secured with: Tape Dental Injury: Teeth and Oropharynx as per pre-operative assessment

## 2022-10-09 NOTE — H&P (Signed)
History and Physical Interval Note:  10/09/2022 7:29 AM  Katrina Poole  has presented today for surgery, with the diagnosis of Spondylolisthesis.  The various methods of treatment have been discussed with the patient and family. After consideration of risks, benefits and other options for treatment, the patient has consented to  Procedure(s): ALIF - L5-S1 with posterior cortical screw augmentation and decompressive laminotomy on the right (N/A) LAMINECTOMY WITH POSTERIOR LATERAL ARTHRODESIS LEVEL 2 (N/A) ABDOMINAL EXPOSURE (N/A) as a surgical intervention.  The patient's history has been reviewed, patient examined, no change in status, stable for surgery.  I have reviewed the patient's chart and labs.  Questions were answered to the patient's satisfaction.     Marty Heck     Patient name: Katrina Poole         MRN: [id number]        DOB: 01/27/1946          Sex: female   REASON FOR CONSULT: Abdominal exposure for L5-S1 ALIF   HPI: Katrina Poole is a 77 y.o. female, with history of hypertension and Mnire's disease and chronic back pain that presents for preop evaluation of abdominal exposure for L5-S1 ALIF.  She describes significant pain with leg numbness since at least August.  She has previously had an L3-L5 PLIF.  Previous abdominal surgery includes laparoscopy for infertility years ago.       Past Medical History:  Diagnosis Date   Aortic atherosclerosis (HCC)     Arthritis      osteo of left hip   Asthma      humidity and exercise during summer   GERD (gastroesophageal reflux disease)     Hypertension      controlled on meds   Meniere syndrome      hearing deaf right ear   Neuromuscular disorder ([place])      finger tips go numb, neck vertebrae   Restless leg syndrome     Spondylolisthesis, lumbosacral region     Vertigo     Wears dentures      full dentures on top, partial bottom           Past Surgical History:  Procedure Laterality Date    ANTERIOR CERVICAL DECOMP/DISCECTOMY FUSION N/A 03/16/2018    Procedure: Anterior Cervical Discectomy Fusion - Cervical Three- Cervical Four, Cervical Four- Cervical Five, Cervical Five-Cervical Six, Cervical Six- Cerival Seven;  Surgeon: Kary Kos, MD;  Location: [place];  Service: Neurosurgery;  Laterality: N/A;  Anterior Cervical Discectomy Fusion - Cervical Three- Cervical Four, Cervical Four- Cervical Five, Cervical Five-Cervical Six, Cervical Six- Ceriva   CARDIAC CATHETERIZATION        x2 both normal   CARPAL TUNNEL RELEASE       COLONOSCOPY       COLONOSCOPY WITH PROPOFOL N/A 06/04/2016    Procedure: COLONOSCOPY WITH PROPOFOL;  Surgeon: Lollie Sails, MD;  Location[facility] ENDOSCOPY;  Service: Endoscopy;  Laterality: N/A;   DILATION AND CURETTAGE OF UTERUS       ECTOPIC PREGNANCY SURGERY       ESOPHAGOGASTRODUODENOSCOPY (EGD) WITH PROPOFOL N/A 06/04/2016    Procedure: ESOPHAGOGASTRODUODENOSCOPY (EGD) WITH PROPOFOL;  Surgeon: Lollie Sails, MD;  Location: [facility] ENDOSCOPY;  Service: Endoscopy;  Laterality: N/A;   exploratory laparoscopy        infertility surgery   HALLUX VALGUS CORRECTION Bilateral     JOINT REPLACEMENT Bilateral      partial knee   KNEE ARTHROPLASTY Left 01/27/2021  Procedure: CONVERSION OF LEFT PARTIAL KNEE TO LEFT TOTAL KNEE.;  Surgeon: Donato Heinz, MD;  Location: [facility] ORS;  Service: Orthopedics;  Laterality: Left;   KNEE ARTHROSCOPY Left 07/19/2015    Procedure: Arthroscopic limited synovectomy along with lateral compartment and retropatellar chondral debridement plus removal of several small loose bodies;  Surgeon: Erin Sons, MD;  [facility];  Service: Orthopedics;  Laterality: Left;  1ST CASE PER CECE   LUMBAR LAMINECTOMY/DECOMPRESSION MICRODISCECTOMY Right 04/29/2020    Procedure: Microdiscectomy - Lumbar Three-Lumbar Four - Lumbar Four-Lumbar Five - right;  Surgeon: Donalee Citrin, MD;  Location: [facility] OR;  Service: Neurosurgery;   Laterality: Right;  Microdiscectomy - Lumbar Three-Lumbar Four - Lumbar Four-Lumbar Five - right   MEDIAL PARTIAL KNEE REPLACEMENT Bilateral     POSTERIOR LUMBAR FUSION   02/2022    L3-4 and L4-5   SHOULDER SURGERY Right     TOTAL HIP ARTHROPLASTY Left 11/25/2015    Procedure: TOTAL HIP ARTHROPLASTY;  Surgeon: Donato Heinz, MD;  Location: [facility];  Service: Orthopedics;  Laterality: Left;   TRIGGER FINGER RELEASE Bilateral             Family History  Problem Relation Age of Onset   Heart failure Mother     COPD Mother     Hypertension Mother     Cancer Father     Stroke Brother        SOCIAL HISTORY: Social History         Socioeconomic History   Marital status: Married      Spouse name: Not on file   Number of children: 1   Years of education: Not on file   Highest education level: Not on file  Occupational History   Not on file  Tobacco Use   Smoking status: Former      Packs/day: 1.50      Years: 30.00      Total pack years: 45.00      Types: Cigarettes      Quit date: 11/12/1990      Years since quitting: 31.8   Smokeless tobacco: Never  Vaping Use   Vaping Use: Never used  Substance and Sexual Activity   Alcohol use: Yes      Alcohol/week: 1.0 standard drink of alcohol      Types: 1 Standard drinks or equivalent per week      Comment: OCC [place], maybe 1 drink a week   Drug use: No   Sexual activity: Not Currently  Other Topics Concern   Not on file  Social History Narrative   Not on file    Social Determinants of Health        Financial Resource Strain: Low Risk  (10/13/2021)    Overall Financial Resource Strain (CARDIA)     Difficulty of Paying Living Expenses: Not hard at all  Food Insecurity: No Food Insecurity (10/13/2021)    Hunger Vital Sign     Worried About Running Out of Food in the Last Year: Never true     Ran Out of Food in the Last Year: Never true  Transportation Needs: No Transportation Needs (10/13/2021)    PRAPARE -  Therapist, art (Medical): No     Lack of Transportation (Non-Medical): No  Physical Activity: Inactive (10/13/2021)    Exercise Vital Sign     Days of Exercise per Week: 0 days     Minutes of Exercise per  Session: 0 min  Stress: No Stress Concern Present (10/13/2021)    [employer] of Occupational Health - Occupational Stress Questionnaire     Feeling of Stress : Not at all  Social Connections: Moderately Integrated (10/13/2021)    Social Connection and Isolation Panel [NHANES]     Frequency of Communication with Friends and Family: More than three times a week     Frequency of Social Gatherings with Friends and Family: Three times a week     Attends Religious Services: Never     Active Member of Clubs or Organizations: Yes     Attends Banker Meetings: More than 4 times per year     Marital Status: Married  Catering manager Violence: Not At Risk (10/13/2021)    Humiliation, Afraid, Rape, and Kick questionnaire     Fear of Current or Ex-Partner: No     Emotionally Abused: No     Physically Abused: No     Sexually Abused: No           Allergies  Allergen Reactions   Azithromycin Hives and Other (See Comments)   Sulfa Antibiotics Other (See Comments) and Rash      Hallucinations   Oxycodone Other (See Comments)      Makes head foggy and interferes with Meniere's syndrome.     Other Other (See Comments)   Sulfacetamide     Beef-Derived Products Other (See Comments)      Tears up my stomach.            Current Outpatient Medications  Medication Sig Dispense Refill   acetaminophen (TYLENOL) 500 MG tablet Take 2 tablets (1,000 mg total) by mouth every 6 (six) hours as needed (DO NOT EXCEED 4000 MG DAILY FROM ALL SOURCES). 30 tablet 2   acetaminophen-codeine (TYLENOL #3) 300-30 MG tablet Take 1-2 tablets by mouth every 4 (four) hours as needed for severe pain. 30 tablet 0   albuterol (VENTOLIN HFA) 108 (90 Base) MCG/ACT inhaler Inhale 2  puffs into the lungs every 6 (six) hours as needed for wheezing or shortness of breath. 18 g 1   alum hydroxide-mag trisilicate (GAVISCON) 80-20 MG CHEW chewable tablet Chew 1 tablet by mouth daily as needed for indigestion or heartburn.        amLODipine (NORVASC) 5 MG tablet Take 1 tablet (5 mg total) by mouth every morning. 90 tablet 0   cholecalciferol (VITAMIN D3) 25 MCG (1000 UNIT) tablet Take 1,000 Units by mouth at bedtime.       clonazePAM (KLONOPIN) 0.5 MG tablet Take 1 tablet (0.5 mg total) by mouth at bedtime as needed for anxiety. 30 tablet 0   DULoxetine (CYMBALTA) 20 MG capsule Take by mouth.       etodolac (LODINE XL) 400 MG 24 hr tablet Take by mouth.       fosinopril (MONOPRIL) 40 MG tablet Take 1 tablet by mouth once daily 90 tablet 0   gabapentin (NEURONTIN) 300 MG capsule Take 1-2 capsules (300-600 mg total) by mouth at bedtime. 180 capsule 1   hydrochlorothiazide (HYDRODIURIL) 25 MG tablet TAKE 1 TABLET BY MOUTH ONCE DAILY IN THE MORNING 90 tablet 1   ketorolac (ACULAR) 0.5 % ophthalmic solution Apply to eye.       LACTOBACILLUS PO Take 1 capsule by mouth daily.       meclizine (ANTIVERT) 12.5 MG tablet Take 12.5 mg by mouth 3 (three) times daily as needed for dizziness. Dr. Eduard Clos  meloxicam (MOBIC) 7.5 MG tablet Take 1 tablet by mouth twice daily 180 tablet 0   Multiple Vitamin (MULTIVITAMIN) tablet Take 1 tablet by mouth every morning.        omeprazole (PRILOSEC) 20 MG capsule Take by mouth.       Probiotic Product (PROBIOTIC DAILY PO) Take 1 capsule by mouth daily.       traMADol (ULTRAM) 50 MG tablet Take by mouth.       zolpidem (AMBIEN) 5 MG tablet Take 1 tablet (5 mg total) by mouth at bedtime. 30 tablet 5    No current facility-administered medications for this visit.      REVIEW OF SYSTEMS:  [X]  denotes positive finding, [ ]  denotes negative finding Cardiac   Comments:  Chest pain or chest pressure:      Shortness of breath upon exertion:      Short of  breath when lying flat:      Irregular heart rhythm:             Vascular      Pain in calf, thigh, or hip brought on by ambulation:      Pain in feet at night that wakes you up from your sleep:       Blood clot in your veins:      Leg swelling:              Pulmonary      Oxygen at home:      Productive cough:       Wheezing:              Neurologic      Sudden weakness in arms or legs:       Sudden numbness in arms or legs:       Sudden onset of difficulty speaking or slurred speech:      Temporary loss of vision in one eye:       Problems with dizziness:              Gastrointestinal      Blood in stool:       Vomited blood:              Genitourinary      Burning when urinating:       Blood in urine:             Psychiatric      Major depression:              Hematologic      Bleeding problems:      Problems with blood clotting too easily:             Skin      Rashes or ulcers:             Constitutional      Fever or chills:          PHYSICAL EXAM:    Vitals:    09/22/22 1006  BP: (!) 157/94  Pulse: 73  Temp: 97.7 F (36.5 C)  TempSrc: Temporal      GENERAL: The patient is a well-nourished female, in no acute distress. The vital signs are documented above. CARDIAC: There is a regular rate and rhythm.  VASCULAR:  Palpable femoral pulses bilaterally Palpable AT pulses bilaterally PULMONARY: No respiratory distress. ABDOMEN: Soft and non-tender. MUSCULOSKELETAL: There are no major deformities or cyanosis. NEUROLOGIC: No focal weakness or paresthesias are detected. SKIN: There are no ulcers or rashes  noted. PSYCHIATRIC: The patient has a normal affect.   DATA:    MRI reviewed    Assessment/Plan:   77 year old female with chronic lower back pain with lower extremity radiculopathy that presents for preop evaluation of abdominal exposure for L5-S1 ALIF.  I have reviewed her MRI and discussed she would be a good candidate for anterior approach.   Discussed transverse incision over the left rectus muscle and then mobilized the peritoneum and left ureter to enter into the retroperitoneum and expose the disc space from the front.  Discussed mobilizing iliac vein and artery.  Discussed risk of injury to the above structures.  Look forward to assisting Dr. Saintclair Halsted.  She has had a previous L3-L5 PLIF.     Marty Heck, MD [facility] Office: [phone]

## 2022-10-09 NOTE — H&P (Signed)
Katrina Poole is an 77 y.o. female.   Chief Complaint: Back and right leg pain HPI: 77 year old female status post L3-L5 fusion has presented with back and severe recurrent right L5-S1 radiculopathy.  Workup is revealed progressive degeneration deterioration at L5-S1 below the level of her previous fusion.  Patient has been refractory to all forms of conservative treatment and due to her progression of clinical syndrome imaging findings failed conservative treatment I recommended decompression and fusion at L5-S1 and we have recommended this through an anterior approach to an interbody fusion posterior laminectomy decompressive Lam and cortical screw augmentation.  I extensively gone over the risks and benefits of that operation with her as well as perioperative course expectations of outcome and alternatives of surgery and she understood and agreed to proceed forward.  Past Medical History:  Diagnosis Date   Aortic atherosclerosis (HCC)    Arthritis    osteo of left hip   Asthma    humidity and exercise during summer   GERD (gastroesophageal reflux disease)    Hypertension    controlled on meds   Meniere syndrome    hearing deaf right ear   Neuromuscular disorder ([place])    finger tips go numb, neck vertebrae   Restless leg syndrome    Spondylolisthesis, lumbosacral region    Vertigo    Wears dentures    full dentures on top, partial bottom    Past Surgical History:  Procedure Laterality Date   ANTERIOR CERVICAL DECOMP/DISCECTOMY FUSION N/A 03/16/2018   Procedure: Anterior Cervical Discectomy Fusion - Cervical Three- Cervical Four, Cervical Four- Cervical Five, Cervical Five-Cervical Six, Cervical Six- Cerival Seven;  Surgeon: Kary Kos, MD;  Location: [facility];  Service: Neurosurgery;  Laterality: N/A;  Anterior Cervical Discectomy Fusion - Cervical Three- Cervical Four, Cervical Four- Cervical Five, Cervical Five-Cervical Six, Cervical Six- Ceriva   BUNIONECTOMY     Left x1, Right  x2.   CARDIAC CATHETERIZATION     x2 both normal   CARPAL TUNNEL RELEASE Right    COLONOSCOPY     COLONOSCOPY WITH PROPOFOL N/A 06/04/2016   Procedure: COLONOSCOPY WITH PROPOFOL;  Surgeon: Lollie Sails, MD;  Location: [facility] ENDOSCOPY;  Service: Endoscopy;  Laterality: N/A;   DILATION AND CURETTAGE OF UTERUS     ECTOPIC PREGNANCY SURGERY     ESOPHAGOGASTRODUODENOSCOPY (EGD) WITH PROPOFOL N/A 06/04/2016   Procedure: ESOPHAGOGASTRODUODENOSCOPY (EGD) WITH PROPOFOL;  Surgeon: Lollie Sails, MD;  Location: [facility] ENDOSCOPY;  Service: Endoscopy;  Laterality: N/A;   exploratory laparoscopy     infertility surgery   HALLUX VALGUS CORRECTION Bilateral    JOINT REPLACEMENT Bilateral    partial knee   KNEE ARTHROPLASTY Left 01/27/2021   Procedure: CONVERSION OF LEFT PARTIAL KNEE TO LEFT TOTAL KNEE.;  Surgeon: Dereck Leep, MD;  Location: [facility] ORS;  Service: Orthopedics;  Laterality: Left;   KNEE ARTHROSCOPY Left 07/19/2015   Procedure: Arthroscopic limited synovectomy along with lateral compartment and retropatellar chondral debridement plus removal of several small loose bodies;  Surgeon: Leanor Kail, MD;  Location: [facility];  Service: Orthopedics;  Laterality: Left;  1ST CASE PER CECE   LUMBAR LAMINECTOMY/DECOMPRESSION MICRODISCECTOMY Right 04/29/2020   Procedure: Microdiscectomy - Lumbar Three-Lumbar Four - Lumbar Four-Lumbar Five - right;  Surgeon: Kary Kos, MD;  Location: [facility];  Service: Neurosurgery;  Laterality: Right;  Microdiscectomy - Lumbar Three-Lumbar Four - Lumbar Four-Lumbar Five - right   MEDIAL PARTIAL KNEE REPLACEMENT Bilateral    POSTERIOR LUMBAR FUSION  02/2022  L3-4 and L4-5   SHOULDER SURGERY Right    TOTAL HIP ARTHROPLASTY Left 11/25/2015   Procedure: TOTAL HIP ARTHROPLASTY;  Surgeon: Dereck Leep, MD;  Location: [facility] ORS;  Service: Orthopedics;  Laterality: Left;   TRIGGER FINGER RELEASE Left     Family History  Problem Relation Age of Onset    Heart failure Mother    COPD Mother    Hypertension Mother    Cancer Father    Heart attack Brother    Heart attack Brother 12   Heart attack Brother    Heart disease Brother    Social History:  reports that she quit smoking about 31 years ago. Her smoking use included cigarettes. She has a 45.00 pack-year smoking history. She has never used smokeless tobacco. She reports current alcohol use of about 1.0 standard drink of alcohol per week. She reports that she does not use drugs.  Allergies:  Allergies  Allergen Reactions   Azithromycin Hives and Other (See Comments)   Sulfa Antibiotics Other (See Comments) and Rash    Hallucinations   Oxycodone Other (See Comments)    Makes head foggy and interferes with Meniere's syndrome.     Sulfacetamide    Beef-Derived Products Other (See Comments)    Tears up my stomach.    Medications Prior to Admission  Medication Sig Dispense Refill   acetaminophen (TYLENOL) 500 MG tablet Take 2 tablets (1,000 mg total) by mouth every 6 (six) hours as needed (DO NOT EXCEED 4000 MG DAILY FROM ALL SOURCES). 30 tablet 2   albuterol (VENTOLIN HFA) 108 (90 Base) MCG/ACT inhaler Inhale 2 puffs into the lungs every 6 (six) hours as needed for wheezing or shortness of breath. 18 g 1   amLODipine (NORVASC) 5 MG tablet Take 1 tablet (5 mg total) by mouth every morning. 90 tablet 0   cholecalciferol (VITAMIN D3) 25 MCG (1000 UNIT) tablet Take 1,000 Units by mouth at bedtime.     clonazePAM (KLONOPIN) 0.5 MG tablet Take 1 tablet (0.5 mg total) by mouth at bedtime as needed for anxiety. 30 tablet 0   fosinopril (MONOPRIL) 40 MG tablet Take 1 tablet by mouth once daily 90 tablet 0   gabapentin (NEURONTIN) 300 MG capsule Take 1-2 capsules (300-600 mg total) by mouth at bedtime. (Patient taking differently: Take 600 mg by mouth at bedtime.) 180 capsule 1   hydrochlorothiazide (HYDRODIURIL) 25 MG tablet TAKE 1 TABLET BY MOUTH ONCE DAILY IN THE MORNING 90 tablet 1    meloxicam (MOBIC) 7.5 MG tablet Take 1 tablet by mouth twice daily 180 tablet 0   Multiple Vitamin (MULTIVITAMIN) tablet Take 1 tablet by mouth every morning.     omeprazole (PRILOSEC) 20 MG capsule Take 20 mg by mouth 2 (two) times daily before a meal.     Probiotic Product (PROBIOTIC DAILY PO) Take 1 capsule by mouth daily.     traMADol (ULTRAM) 50 MG tablet Take 50 mg by mouth every 6 (six) hours as needed for moderate pain.     zolpidem (AMBIEN) 5 MG tablet Take 1 tablet (5 mg total) by mouth at bedtime. 30 tablet 5   acetaminophen-codeine (TYLENOL #3) 300-30 MG tablet Take 1-2 tablets by mouth every 4 (four) hours as needed for severe pain. (Patient not taking: Reported on 09/22/2022) 30 tablet 0   alum hydroxide-mag trisilicate (GAVISCON) [id number] MG CHEW chewable tablet Chew 1 tablet by mouth daily as needed for indigestion or heartburn.      meclizine (ANTIVERT)  12.5 MG tablet Take 12.5 mg by mouth 3 (three) times daily as needed for dizziness. Dr. Marlyce Huge      No results found for this or any previous visit (from the past 69 hour(s)). No results found.  Review of Systems  Musculoskeletal:  Positive for back pain.  Neurological:  Positive for numbness.    Blood pressure (!) 152/79, pulse 84, temperature 98.1 F (36.7 C), temperature source Oral, resp. rate 18, height 4\' 11"  (1.499 m), weight 95.3 kg, SpO2 96 %. Physical Exam HENT:     Head: Normocephalic.     Right Ear: Tympanic membrane normal.     Nose: Nose normal.     Mouth/Throat:     Mouth: Mucous membranes are moist.  Eyes:     Pupils: Pupils are equal, round, and reactive to light.  Cardiovascular:     Rate and Rhythm: Normal rate.     Pulses: Normal pulses.  Pulmonary:     Effort: Pulmonary effort is normal.  Abdominal:     General: Abdomen is flat.  Musculoskeletal:        General: Normal range of motion.     Cervical back: Normal range of motion.  Neurological:     Mental Status: She is alert.     Comments:  Patient is awake and alert strength is 5 out of 5 in her iliopsoas, quads, hamstrings, gastrocs, into tibialis, and EHL.      Assessment/Plan 77 year old presents for anterior lumbar interbody fusion L5-S1 with posterior decompressive laminotomy and cortical screw fixation.  Katrina Hoops, MD 10/09/2022, 7:23 AM

## 2022-10-09 NOTE — Transfer of Care (Signed)
Immediate Anesthesia Transfer of Care Note  Patient: Katrina Poole  Procedure(s) Performed: Anteriorr Lumbar Interbody Fusion - Level Five-Sacral One  with posterior cortical screw augmentation and decompressive laminotomy on the right (Spine Lumbar) LAMINECTOMY WITH POSTERIOR LATERAL ARTHRODESIS LEVEL TWO (Back) ABDOMINAL EXPOSURE  Patient Location: PACU  Anesthesia Type:General  Level of Consciousness: awake, oriented, patient cooperative, and responds to stimulation  Airway & Oxygen Therapy: Patient Spontanous Breathing and Patient connected to face mask oxygen  Post-op Assessment: Report given to RN and Post -op Vital signs reviewed and stable  Post vital signs: Reviewed and stable  Last Vitals:  Vitals Value Taken Time  BP 153/74 10/09/22 1239  Temp    Pulse 89 10/09/22 1242  Resp 17 10/09/22 1242  SpO2 97 % 10/09/22 1242  Vitals shown include unvalidated device data.  Last Pain:  Vitals:   10/09/22 0611  TempSrc:   PainSc: 5          Complications: No notable events documented.

## 2022-10-09 NOTE — Op Note (Signed)
Date: October 09, 2022  Preoperative diagnosis: Chronic lower back pain  Postoperative diagnosis: Same  Procedure: Anterior spine exposure at the L5-S1 disc space via anterior retroperitoneal approach for L5-S1 ALIF  Surgeon: Dr. Marty Heck, MD  Co-surgeon: Dr. Kary Kos, MD  Indications: 77 year old female who previously had a L3-L5 posterior fusion presents with recurrent chronic back pain with right leg radiculopathy.  She has been evaluated Dr. Saintclair Halsted who has recommended an L5-S1 ALIF.  Vascular surgery was asked to assist with anterior spine exposure and presents after risks benefits discussed.  Findings: The L5-S1 disc space was marked over the left rectus muscle with a fluoroscopic C arm in the lateral position.  Transverse incision was made here and we dissected through subcutaneous tissue and opened the anterior rectus sheath and mobilized the left rectus muscle to the midline and entered into the retroperitoneum by mobilizing peritoneum and left ureter to the midline.  Middle sacral vessels were divided between clips.  Mobilized on both sides of the disc space.  The left iliac vein was significantly scarred in place given previous posterior fusion with instrumentation and screws.  Ultimately we got fixed NuVasive retractors placed at the L5-S1 disc space.  Confirmed we were at the correct level with spinal needle in the disc space on lateral fluroscopy.  Anesthesia: General  Details: Patient was taken to the operating room after informed consent was obtained.  Placed on the operative table in supine position.  General endotracheal anesthesia was induced.  Fluoroscopic C-arm was then used in the lateral position to mark the L5-S1 disc space over the left rectus muscle.  The abdominal was then prepped and draped in standard sterile fashion.  Preoperative antibiotics were given.  Timeout performed.  Initially made a transverse incision at the preoperative mark.  Dissected down with  Bovie cautery and used cerebellar retractors for added visualization.  We dissected down to the anterior rectus sheath.  This was opened transversely over the L5-S1 disc space over the left rectus muscle.  I then raised flaps underneath the anterior rectus sheath with hemostats.  The left rectus muscle was mobilized to the midline.  I then entered into the retroperitoneum by mobilizing peritoneum and left ureter to the midline with blunt dissection.  Dr. Saintclair Halsted used hand held Wiley retractors to pull the peritoneum and left ureter to the midline.  Middle sacral vessels were divided between clips.  I then used blunt dissection with Kd and suction to mobilize to the right of the disc space and also to the left of the disc space.  The left iliac vein was significantly scarred in place and this required very tedious dissection in order to get enough working room to place a retractor.  A fixed NuVasive retractor was placed on the field.  I put a 180 reverse lip to the right of the disc space and a 160 reverse lip to the left of the disc space.  I placed a 140 reverse lip cranial and caudal to the anterior disc space to get good exposure.  Spinal needle was placed in the disc space.  We confirmed on lateral fluoroscopy we were at the correct level.  Case was turned over to Dr. Saintclair Halsted.  Complication: None  Condition: Stable  Marty Heck, MD [facility] Office: [place]

## 2022-10-09 NOTE — Anesthesia Postprocedure Evaluation (Signed)
Anesthesia Post Note  Patient: Katrina Poole  Procedure(s) Performed: Anteriorr Lumbar Interbody Fusion - Level Five-Sacral One  with posterior cortical screw augmentation and decompressive laminotomy on the right (Spine Lumbar) LAMINECTOMY WITH POSTERIOR LATERAL ARTHRODESIS LEVEL TWO (Back) ABDOMINAL EXPOSURE     Patient location during evaluation: PACU Anesthesia Type: General Level of consciousness: awake and alert, patient cooperative and oriented Pain management: pain level controlled Vital Signs Assessment: post-procedure vital signs reviewed and stable Respiratory status: spontaneous breathing, nonlabored ventilation and respiratory function stable Cardiovascular status: blood pressure returned to baseline and stable Postop Assessment: no apparent nausea or vomiting Anesthetic complications: no Comments: Pt with eye irritation, Ketorolac gtts given   No notable events documented.  Last Vitals:  Vitals:   10/09/22 1430 10/09/22 1510  BP: (!) 145/73 (!) 140/71  Pulse: 95 97  Resp: 14 18  Temp: 36.7 C 36.5 C  SpO2: 95% 97%    Last Pain:  Vitals:   10/09/22 1510  TempSrc: Oral  PainSc:                  [name],[name]

## 2022-10-10 MED ORDER — CYCLOBENZAPRINE HCL 10 MG PO TABS: 10.0000 mg | ORAL_TABLET | Freq: Three times a day (TID) | ORAL | 2 refills | Status: DC | PRN

## 2022-10-10 MED ORDER — ACETAMINOPHEN-CODEINE 300-30 MG PO TABS: 1.0000 | ORAL_TABLET | Freq: Four times a day (QID) | ORAL | 0 refills | Status: DC | PRN

## 2022-10-10 NOTE — Discharge Summary (Signed)
Physician Discharge Summary  Patient ID: Katrina Poole MRN: [id number] DOB/AGE: 10/20/1945 77 y.o.  Admit date: 10/09/2022 Discharge date: 10/10/2022  Admission Diagnoses:  Lumbar DDD  Discharge Diagnoses:  Same Principal Problem:   DDD (degenerative disc disease), lumbar   Discharged Condition: Stable  Hospital Course:  Katrina Poole is a 77 y.o. female who underwent elective L5-S1 ALIF, PSIF,  Postoperatively, she was mobilized with the aid of PT.  Her pain was well-controlled with p.o. pain medications.  She was passing gas.  She was deemed ready for discharge home on 10/10/2022.  Treatments: Surgery - L5-S1 ALIF, PSIF  Discharge Exam: Blood pressure (!) 110/53, pulse 84, temperature 99.8 F (37.7 C), temperature source Oral, resp. rate 18, height 4\' 11"  (1.499 m), weight 95.3 kg, SpO2 94 %. Awake, alert, oriented Speech fluent, appropriate CN grossly intact Abdomen soft 5/5 BUE/BLE Wound c/d/i  Disposition: Discharge disposition: 01-Home or Self Care        Allergies as of 10/10/2022       Reactions   Azithromycin Hives, Other (See Comments)   Sulfa Antibiotics Other (See Comments), Rash   Hallucinations   Oxycodone Other (See Comments)   Makes head foggy and interferes with Meniere's syndrome.     Sulfacetamide    Beef-derived Products Other (See Comments)   Tears up my stomach.        Medication List     TAKE these medications    acetaminophen 500 MG tablet Commonly known as: TYLENOL Take 2 tablets (1,000 mg total) by mouth every 6 (six) hours as needed (DO NOT EXCEED 4000 MG DAILY FROM ALL SOURCES).   acetaminophen-codeine 300-30 MG tablet Commonly known as: TYLENOL #3 Take 1-2 tablets by mouth every 4 (four) hours as needed for severe pain. What changed: Another medication with the same name was added. Make sure you understand how and when to take each.   acetaminophen-codeine 300-30 MG tablet Commonly known as: TYLENOL #3 Take 1-2  tablets by mouth every 6 (six) hours as needed for moderate pain. What changed: You were already taking a medication with the same name, and this prescription was added. Make sure you understand how and when to take each.   albuterol 108 (90 Base) MCG/ACT inhaler Commonly known as: VENTOLIN HFA Inhale 2 puffs into the lungs every 6 (six) hours as needed for wheezing or shortness of breath.   alum hydroxide-mag trisilicate 01-02 MG Chew chewable tablet Commonly known as: GAVISCON Chew 1 tablet by mouth daily as needed for indigestion or heartburn.   amLODipine 5 MG tablet Commonly known as: NORVASC Take 1 tablet (5 mg total) by mouth every morning.   cholecalciferol 25 MCG (1000 UNIT) tablet Commonly known as: VITAMIN D3 Take 1,000 Units by mouth at bedtime.   clonazePAM 0.5 MG tablet Commonly known as: KLONOPIN Take 1 tablet (0.5 mg total) by mouth at bedtime as needed for anxiety.   cyclobenzaprine 10 MG tablet Commonly known as: FLEXERIL Take 1 tablet (10 mg total) by mouth 3 (three) times daily as needed for muscle spasms.   fosinopril 40 MG tablet Commonly known as: MONOPRIL Take 1 tablet by mouth once daily   gabapentin 300 MG capsule Commonly known as: NEURONTIN Take 1-2 capsules (300-600 mg total) by mouth at bedtime. What changed: how much to take   hydrochlorothiazide 25 MG tablet Commonly known as: HYDRODIURIL TAKE 1 TABLET BY MOUTH ONCE DAILY IN THE MORNING   meclizine 12.5 MG tablet Commonly known as: ANTIVERT  Take 12.5 mg by mouth 3 (three) times daily as needed for dizziness. Dr. Eduard Clos   meloxicam 7.5 MG tablet Commonly known as: MOBIC Take 1 tablet by mouth twice daily   multivitamin tablet Take 1 tablet by mouth every morning.   omeprazole 20 MG capsule Commonly known as: PRILOSEC Take 20 mg by mouth 2 (two) times daily before a meal.   PROBIOTIC DAILY PO Take 1 capsule by mouth daily.   traMADol 50 MG tablet Commonly known as: ULTRAM Take 50  mg by mouth every 6 (six) hours as needed for moderate pain.   zolpidem 5 MG tablet Commonly known as: AMBIEN Take 1 tablet (5 mg total) by mouth at bedtime.        Follow-up Information     Donalee Citrin, MD Follow up in 2 week(s).   Specialty: Neurosurgery Contact information: [address] [place] [address] [phone]                 Signed: Bedelia Person 10/10/2022, 10:27 AM

## 2022-10-10 NOTE — Progress Notes (Signed)
PT Cancellation Note  Patient Details Name: BLANKA ROCKHOLT MRN: [id number] DOB: 05-03-1946   Cancelled Treatment:    Reason Eval/Treat Not Completed: PT screened, no needs identified, will sign off  Patient finishing OT session on arrival. Patient mobilizing well, knows precautions and has no PT needs at this time.    [place]  Office [phone]  Rexanne Mano 10/10/2022, 11:17 AM

## 2022-10-10 NOTE — Evaluation (Signed)
Occupational Therapy Evaluation Patient Details Name: Katrina Poole MRN: [id number] DOB: 05/22/1946 Today's Date: 10/10/2022   History of Present Illness Pt is a 77 y.o. female s/p anterior lumbar interbody fusion L5-S1 with posterior cortical screws augmentation and laminotomy on the R side. PMH significant for aortic atherosclerosis, arthritis, GERD, HTN, meniere syndrome, neuromuscular disorder, L3-5 fusion, ACDF, and vertigo.   Clinical Impression   PTA, pt lived with her husband and was independent. Upon eval, pt performing UB Adl with set-up and LB ADL with supervision. Pt educated and demonstrating use of compensatory techniques for bed mobility, LB dressing, grooming, brace application, toileting, shower transfer, and stair training within precautions. Pt able to recall compensatory techniques from prior surgery. Reviewed movement recommendations as well. Recommending discharge home with husband to assist as needed and no OT follow up at this time.      Recommendations for follow up therapy are one component of a multi-disciplinary discharge planning process, led by the attending physician.  Recommendations may be updated based on patient status, additional functional criteria and insurance authorization.   Follow Up Recommendations  No OT follow up     Assistance Recommended at Discharge PRN  Patient can return home with the following A little help with walking and/or transfers;Help with stairs or ramp for entrance    Functional Status Assessment  Patient has had a recent decline in their functional status and demonstrates the ability to make significant improvements in function in a reasonable and predictable amount of time.  Equipment Recommendations  None recommended by OT    Recommendations for Other Services       Precautions / Restrictions Precautions Precautions: Back Precaution Booklet Issued: Yes (comment) Precaution Comments: All precautions reviewed within the  context of ADL      Mobility Bed Mobility Overal bed mobility: Modified Independent                  Transfers Overall transfer level: Needs assistance Equipment used: None Transfers: Sit to/from Stand Sit to Stand: Supervision           General transfer comment: supervision for safety      Balance Overall balance assessment: Mild deficits observed, not formally tested                                         ADL either performed or assessed with clinical judgement   ADL Overall ADL's : Needs assistance/impaired Eating/Feeding: Modified independent   Grooming: Supervision/safety   Upper Body Bathing: Set up   Lower Body Bathing: Supervison/ safety   Upper Body Dressing : Set up   Lower Body Dressing: Supervision/safety Lower Body Dressing Details (indicate cue type and reason): Does not wear socks at baseline, has slide in shoes Toilet Transfer: Supervision/safety;Ambulation;Rolling walker (2 wheels);Regular Teacher, adult education Details (indicate cue type and reason): performing during session Toileting- Clothing Manipulation and Hygiene: Supervision/safety   Tub/ Shower Transfer: Walk-in shower;Min guard;Rolling walker (2 wheels);Ambulation   Functional mobility during ADLs: Supervision/safety;Rolling walker (2 wheels)       Vision Ability to See in Adequate Light: 0 Adequate Patient Visual Report: No change from baseline Vision Assessment?: No apparent visual deficits     Perception Perception Perception Tested?: No   Praxis Praxis Praxis tested?: Not tested    Pertinent Vitals/Pain Pain Assessment Pain Assessment: Faces Faces Pain Scale: Hurts a little bit Pain Location: operative  Pain Descriptors / Indicators: Operative site guarding Pain Intervention(s): Limited activity within patient's tolerance, Monitored during session     Hand Dominance Right   Extremity/Trunk Assessment Upper Extremity Assessment Upper  Extremity Assessment: Overall WFL for tasks assessed   Lower Extremity Assessment Lower Extremity Assessment: Generalized weakness   Cervical / Trunk Assessment Cervical / Trunk Assessment: Normal   Communication Communication Communication: No difficulties   Cognition Arousal/Alertness: Awake/alert Behavior During Therapy: WFL for tasks assessed/performed Overall Cognitive Status: Within Functional Limits for tasks assessed                                       General Comments  VSS    Exercises     Shoulder Instructions      Home Living Family/patient expects to be discharged to:: Private residence Living Arrangements: Spouse/significant other Available Help at Discharge: Family;Available 24 hours/day Type of Home: House Home Access: Stairs to enter [employer] of Steps: 7: 3, platform, 3, platform, 1 small step up to house. Entrance Stairs-Rails: Right;Left Home Layout: One level     Bathroom Shower/Tub: Occupational psychologist: Standard Bathroom Accessibility: Yes   Home Equipment: Conservation officer, nature (2 wheels);Cane - single point;Adaptive equipment;BSC/3in1 Adaptive Equipment: Reacher        Prior Functioning/Environment Prior Level of Function : Independent/Modified Independent             Mobility Comments: No AD, occasionally had husband provide SBA ADLs Comments: independent, driving, cooking, cleaning, etc        OT Problem List: Decreased strength;Decreased activity tolerance;Impaired balance (sitting and/or standing);Decreased knowledge of use of DME or AE;Decreased knowledge of precautions;Pain      OT Treatment/Interventions:      OT Goals(Current goals can be found in the care plan section) Acute Rehab OT Goals Patient Stated Goal: go home OT Goal Formulation: With patient  OT Frequency:      Co-evaluation              AM-PAC OT "6 Clicks" Daily Activity     Outcome Measure Help from another  person eating meals?: None Help from another person taking care of personal grooming?: A Little Help from another person toileting, which includes using toliet, bedpan, or urinal?: A Little Help from another person bathing (including washing, rinsing, drying)?: A Little Help from another person to put on and taking off regular upper body clothing?: A Little Help from another person to put on and taking off regular lower body clothing?: A Little 6 Click Score: 19   End of Session Equipment Utilized During Treatment: Gait belt;Rolling walker (2 wheels);Back brace Nurse Communication: Mobility status  Activity Tolerance: Patient tolerated treatment well Patient left: with call bell/phone within reach;in chair;with family/visitor present  OT Visit Diagnosis: Unsteadiness on feet (R26.81);Muscle weakness (generalized) (M62.81);Other abnormalities of gait and mobility (R26.89);Pain Pain - part of body:  (back)                Time: 7342-8768 OT Time Calculation (min): 35 min Charges:  OT General Charges $OT Visit: 1 Visit OT Evaluation $OT Eval Low Complexity: 1 Low OT Treatments $Self Care/Home Management : 8-22 mins  Elder Cyphers, OTR/L [facility] Office: [phone]   Magnus Ivan 10/10/2022, 11:37 AM

## 2022-10-10 NOTE — Plan of Care (Signed)
  Problem: Education: Goal: Knowledge of General Education information will improve Description: Including pain rating scale, medication(s)/side effects and non-pharmacologic comfort measures Outcome: Adequate for Discharge   

## 2022-10-10 NOTE — Discharge Instructions (Addendum)
Can remove transparent dressing and shower 48 hours after surgery Walk as much as possible No heavy lifting >10 lbs No excessive bending/twisting at the waist  

## 2022-10-10 NOTE — Progress Notes (Signed)
Pt transferred to 3W35 via bed.

## 2022-10-10 NOTE — Progress Notes (Signed)
  Daily Progress Note  S/p:1 Day Post-Op Anteriorr Lumbar Interbody Fusion - Level Five-Sacral One  with posterior cortical screw augmentation and decompressive laminotomy on the right, LAMINECTOMY WITH POSTERIOR LATERAL ARTHRODESIS LEVEL TWO   Subjective: Feels Okay this morning.  Ready to get out of bed this morning  Objective: Vitals:   10/10/22 0310 10/10/22 0758  BP: 119/67 (!) 110/53  Pulse: 87 84  Resp:  18  Temp: 98.3 F (36.8 C) 99.8 F (37.7 C)  SpO2: 95% 94%    Physical Examination Abdomen soft, obese Dry dressing in the lower abdomen Palpable pulse in the feet Nonlabored breathing Conversational  ASSESSMENT/PLAN:  Patient is a 77 year old female status post anterior lumbar interbody fusion. Doing well from vascular surgery perspective.  Will defer management to neurosurgery   Cassandria Santee MD [facility] [phone] 10/10/2022  8:26 AM

## 2022-10-11 ENCOUNTER — Other Ambulatory Visit: Payer: Self-pay | Admitting: Internal Medicine

## 2022-10-11 DIAGNOSIS — G2581 Restless legs syndrome: Secondary | ICD-10-CM

## 2022-10-12 ENCOUNTER — Telehealth: Payer: Self-pay | Admitting: *Deleted

## 2022-10-12 NOTE — Telephone Encounter (Signed)
Requested Prescriptions  Pending Prescriptions Disp Refills   omeprazole (PRILOSEC) 20 MG capsule [Pharmacy Med Name: Omeprazole 20 MG Oral Capsule Delayed Release] 180 capsule 0    Sig: TAKE 1 CAPSULE BY MOUTH TWICE DAILY BEFORE A MEAL     Gastroenterology: Proton Pump Inhibitors Passed - 10/11/2022  7:51 AM      Passed - Valid encounter within last 12 months    Recent Outpatient Visits           5 months ago Prediabetes   [place] [facility] at [facility], Jesse Sans, MD   11 months ago Annual physical exam   [place] [facility] at [facility], Jesse Sans, MD   1 year ago Essential hypertension   [place] [facility] at [facility], Jesse Sans, MD   1 year ago Essential hypertension   [place] [facility] and Sports Medicine at [facility], Jesse Sans, MD   1 year ago Essential hypertension   [place] [facility] Care and Sports Medicine at [facility], Jesse Sans, MD       Future Appointments             In 1 week Army Melia, Jesse Sans, MD [facility] Primary Care and Sports Medicine at [facility], [facility]   In 3 months Kate Sable, MD [place]. [facility]             gabapentin (NEURONTIN) 300 MG capsule [Pharmacy Med Name: Gabapentin 300 MG Oral Capsule] 180 capsule 0    Sig: TAKE 1 TO 2 CAPSULES BY MOUTH AT BEDTIME     Neurology: Anticonvulsants - gabapentin Passed - 10/11/2022  7:51 AM      Passed - Cr in normal range and within 360 days    Creatinine, Ser  Date Value Ref Range Status  09/29/2022 0.86 0.44 - 1.00 mg/dL Final         Passed - Completed PHQ-2 or PHQ-9 in the last 360 days      Passed - Valid encounter within last 12 months    Recent Outpatient Visits           5 months ago Prediabetes   [place] [facility] at [facility], Jesse Sans, MD   11 months ago Annual physical exam   [facility] and Sports Medicine at [facility], Jesse Sans, MD   1 year ago Essential hypertension   [place] [facility] and [facility] Medicine at [facility], Jesse Sans, MD   1 year ago Essential hypertension   [place] [facility] Care and Sports Medicine at [facility], Jesse Sans, MD   1 year ago Essential hypertension   [place] [facility] Sports Medicine at [facility], Jesse Sans, MD       Future Appointments             In 1 week Army Melia, Jesse Sans, MD [facility] Primary Care and Sports Medicine at [facility], [facility]   In 3 months Kate Sable, MD [place]. [place]

## 2022-10-12 NOTE — Telephone Encounter (Signed)
Requested medications are due for refill today.  unsure  Requested medications are on the active medications list.  no  Last refill. 04/20/2022  Future visit scheduled.   yes  Notes to clinic.   Medication listed as historical.    Requested Prescriptions  Pending Prescriptions Disp Refills   omeprazole (PRILOSEC) 20 MG capsule [Pharmacy Med Name: Omeprazole 20 MG Oral Capsule Delayed Release] 180 capsule 0    Sig: TAKE 1 CAPSULE BY MOUTH TWICE DAILY BEFORE A MEAL     Gastroenterology: Proton Pump Inhibitors Passed - 10/11/2022  7:51 AM      Passed - Valid encounter within last 12 months    Recent Outpatient Visits           5 months ago Prediabetes   [place] [facility] Medicine at [facility], Nyoka Cowden, MD   11 months ago Annual physical exam   [place] [facility] at [facility], Nyoka Cowden, MD   1 year ago Essential hypertension   [place] [facility] and [facility] Medicine at [facility], Nyoka Cowden, MD   1 year ago Essential hypertension   [place] [facility] Care and Sports Medicine at [facility], Nyoka Cowden, MD   1 year ago Essential hypertension   [place] [facility] Medicine at [facility], Nyoka Cowden, MD       Future Appointments             In 1 week Judithann Graves, Nyoka Cowden, MD [facility] Primary Care and Sports Medicine at [facility], [facility]   In 3 months Debbe Odea, MD [facility] [place]. [facility]            Signed Prescriptions Disp Refills   gabapentin (NEURONTIN) 300 MG capsule 180 capsule 1    Sig: TAKE 1 TO 2 CAPSULES BY MOUTH AT BEDTIME     Neurology: Anticonvulsants - gabapentin Passed - 10/11/2022  7:51 AM      Passed - Cr in normal range and within 360 days    Creatinine, Ser  Date Value Ref Range Status  09/29/2022 0.86 0.44 - 1.00 mg/dL Final         Passed - Completed PHQ-2 or PHQ-9  in the last 360 days      Passed - Valid encounter within last 12 months    Recent Outpatient Visits           5 months ago Prediabetes   [place] [facility] and Sports Medicine at [facility], Nyoka Cowden, MD   11 months ago Annual physical exam   [facility] and [facility] at [facility], Nyoka Cowden, MD   1 year ago Essential hypertension   [place] [facility] Care and Sports Medicine at [facility], Nyoka Cowden, MD   1 year ago Essential hypertension   [place] Primary Care and Sports Medicine at [facility], Nyoka Cowden, MD   1 year ago Essential hypertension    Primary Care and Sports Medicine at [facility], Nyoka Cowden, MD       Future Appointments             In 1 week Judithann Graves, Nyoka Cowden, MD [facility] Primary Care and Sports Medicine at [facility], [facility]   In 3 months Debbe Odea, MD [facility] Of  [place]. [place]

## 2022-10-12 NOTE — Patient Outreach (Signed)
  Care Coordination [facility] Note Transition Care Management Follow-up Telephone Call Date of discharge and from where: [phone] [facility] DDD How have you been since you were released from the hospital? I am doing good. I am not having any spasms. I don't need to take the flexeril.  Any questions or concerns? No  Items Reviewed: Did the pt receive and understand the discharge instructions provided? Yes  Medications obtained and verified? Yes only taking ultram. Flexeril not needed to take Other? No  Any new allergies since your discharge? No  Dietary orders reviewed? No Do you have support at home? Yes   Home Care and Equipment/Supplies: Were home health services ordered? no If so, what is the name of the agency? N/a  Has the agency set up a time to come to the patient's home? not applicable Were any new equipment or medical supplies ordered?  No What is the name of the medical supply agency? N/a Were you able to get the supplies/equipment? not applicable Do you have any questions related to the use of the equipment or supplies? No  Functional Questionnaire: (I = Independent and D = Dependent) ADLs: I  Bathing/Dressing- I  Meal Prep- I  Eating- I  Maintaining continence- I  Transferring/Ambulation- I  Managing Meds- I  Follow up appointments reviewed:  PCP Hospital f/u appt confirmed? Yes  Dr Army Melia [phone] [facility] f/u appt confirmed? Yes  Dr Saintclair Halsted [phone] 2:30 Are transportation arrangements needed? No  If their condition worsens, is the pt aware to call PCP or go to the Emergency Dept.? Yes Was the patient provided with contact information for the PCP's office or ED? Yes Was to pt encouraged to call back with questions or concerns? Yes  SDOH assessments and interventions completed:   Yes SDOH Interventions Today    Flowsheet Row Most Recent Value  SDOH Interventions   Food Insecurity Interventions Intervention Not Indicated  Housing Interventions  Intervention Not Indicated  Transportation Interventions Intervention Not Indicated       Care Coordination Interventions:  No Care Coordination interventions needed at this time. RN discussed [employer] meal plan , but patient declined.     Encounter Outcome:  Pt. Visit Completed    [place] [facility] [phone]

## 2022-10-14 ENCOUNTER — Encounter (HOSPITAL_COMMUNITY): Payer: Self-pay | Admitting: Neurosurgery

## 2022-10-14 ENCOUNTER — Ambulatory Visit: Payer: Medicare Other

## 2022-10-14 MED FILL — Sodium Chloride IV Soln 0.9%: INTRAVENOUS | Qty: 1000 | Status: AC

## 2022-10-14 MED FILL — Heparin Sodium (Porcine) Inj 1000 Unit/ML: INTRAMUSCULAR | Qty: 30 | Status: AC

## 2022-10-16 ENCOUNTER — Ambulatory Visit (INDEPENDENT_AMBULATORY_CARE_PROVIDER_SITE_OTHER): Payer: Medicare Other

## 2022-10-16 DIAGNOSIS — Z0000 Encounter for general adult medical examination without abnormal findings: Secondary | ICD-10-CM

## 2022-10-16 NOTE — Progress Notes (Signed)
I connected with  Katrina Poole on 10/16/22 by a audio enabled telemedicine application and verified that I am speaking with the correct person using two identifiers.  Patient Location: Home  Provider Location: Office/Clinic  I discussed the limitations of evaluation and management by telemedicine. The patient expressed understanding and agreed to proceed.  Subjective:   Katrina Poole is a 77 y.o. female who presents for Medicare Annual (Subsequent) preventive examination.  Review of Systems    Per HPI unless specifically indicated below.  Cardiac Risk Factors include: advanced age (>76men, >55 women);female gender, Essential hypertension, Prediabetes, and Mixed hyperlipidemia.           Objective:       10/10/2022   12:03 PM 10/10/2022   11:56 AM 10/10/2022   11:04 AM  Vitals with BMI  Systolic 108 95 113  Diastolic 58 56 59  Pulse  77     Today's Vitals   10/16/22 0826  PainSc: 2    There is no height or weight on file to calculate BMI.     10/09/2022    6:06 AM 09/29/2022   10:22 AM 02/16/2022    9:57 PM 02/16/2022    5:16 PM 02/09/2022    8:37 AM 02/04/2022    9:44 AM 10/13/2021    8:37 AM  Advanced Directives  Does Patient Have a Medical Advance Directive? Yes No Yes Yes Yes Yes Yes  Type of Estate agent of [place];Living will   Healthcare Power of [place];Living will Healthcare Power of [place];Living will Healthcare Power of [place];Living will Healthcare Power of [place];Living will  Does patient want to make changes to medical advance directive? No - Patient declined  No - Guardian declined   No - Patient declined   Copy of Healthcare Power of Attorney in Chart? No - copy requested  No - copy requested, Physician notified  No - copy requested No - copy requested No - copy requested  Would patient like information on creating a medical advance directive?  No - Patient declined         Current Medications (verified) Outpatient  Encounter Medications as of 10/16/2022  Medication Sig   acetaminophen (TYLENOL) 500 MG tablet Take 2 tablets (1,000 mg total) by mouth every 6 (six) hours as needed (DO NOT EXCEED 4000 MG DAILY FROM ALL SOURCES).   acetaminophen-codeine (TYLENOL #3) 300-30 MG tablet Take 1-2 tablets by mouth every 6 (six) hours as needed for moderate pain.   albuterol (VENTOLIN HFA) 108 (90 Base) MCG/ACT inhaler Inhale 2 puffs into the lungs every 6 (six) hours as needed for wheezing or shortness of breath.   alum hydroxide-mag trisilicate (GAVISCON) 80-20 MG CHEW chewable tablet Chew 1 tablet by mouth daily as needed for indigestion or heartburn.    amLODipine (NORVASC) 5 MG tablet Take 1 tablet (5 mg total) by mouth every morning.   cholecalciferol (VITAMIN D3) 25 MCG (1000 UNIT) tablet Take 1,000 Units by mouth at bedtime.   clonazePAM (KLONOPIN) 0.5 MG tablet Take 1 tablet (0.5 mg total) by mouth at bedtime as needed for anxiety.   cyclobenzaprine (FLEXERIL) 10 MG tablet Take 1 tablet (10 mg total) by mouth 3 (three) times daily as needed for muscle spasms.   fosinopril (MONOPRIL) 40 MG tablet Take 1 tablet by mouth once daily   gabapentin (NEURONTIN) 300 MG capsule TAKE 1 TO 2 CAPSULES BY MOUTH AT BEDTIME   hydrochlorothiazide (HYDRODIURIL) 25 MG tablet TAKE 1 TABLET BY MOUTH  ONCE DAILY IN THE MORNING   meclizine (ANTIVERT) 12.5 MG tablet Take 12.5 mg by mouth 3 (three) times daily as needed for dizziness. Dr. Marlyce Huge   Multiple Vitamin (MULTIVITAMIN) tablet Take 1 tablet by mouth every morning.   omeprazole (PRILOSEC) 20 MG capsule TAKE 1 CAPSULE BY MOUTH TWICE DAILY BEFORE A MEAL   Probiotic Product (PROBIOTIC DAILY PO) Take 1 capsule by mouth daily.   traMADol (ULTRAM) 50 MG tablet Take 50 mg by mouth every 6 (six) hours as needed for moderate pain.   zolpidem (AMBIEN) 5 MG tablet Take 1 tablet (5 mg total) by mouth at bedtime.   meloxicam (MOBIC) 7.5 MG tablet Take 1 tablet by mouth twice daily (Patient not  taking: Reported on 10/16/2022)   [DISCONTINUED] acetaminophen-codeine (TYLENOL #3) 300-30 MG tablet Take 1-2 tablets by mouth every 4 (four) hours as needed for severe pain. (Patient not taking: Reported on 09/22/2022)   No facility-administered encounter medications on file as of 10/16/2022.    Allergies (verified) Azithromycin, Sulfa antibiotics, Oxycodone, Sulfacetamide, and Beef-derived products   History: Past Medical History:  Diagnosis Date   Aortic atherosclerosis (HCC)    Arthritis    osteo of left hip   Asthma    humidity and exercise during summer   GERD (gastroesophageal reflux disease)    Hypertension    controlled on meds   Meniere syndrome    hearing deaf right ear   Neuromuscular disorder ([place])    finger tips go numb, neck vertebrae   Restless leg syndrome    Spondylolisthesis, lumbosacral region    Vertigo    Wears dentures    full dentures on top, partial bottom   Past Surgical History:  Procedure Laterality Date   ABDOMINAL EXPOSURE N/A 10/09/2022   Procedure: ABDOMINAL EXPOSURE;  Surgeon: Marty Heck, MD;  Location: [place];  Service: Vascular;  Laterality: N/A;   ANTERIOR CERVICAL DECOMP/DISCECTOMY FUSION N/A 03/16/2018   Procedure: Anterior Cervical Discectomy Fusion - Cervical Three- Cervical Four, Cervical Four- Cervical Five, Cervical Five-Cervical Six, Cervical Six- Cerival Seven;  Surgeon: Kary Kos, MD;  Location: [place];  Service: Neurosurgery;  Laterality: N/A;  Anterior Cervical Discectomy Fusion - Cervical Three- Cervical Four, Cervical Four- Cervical Five, Cervical Five-Cervical Six, Cervical Six- Ceriva   ANTERIOR LUMBAR FUSION N/A 10/09/2022   Procedure: Anteriorr Lumbar Interbody Fusion - Level Five-Sacral One  with posterior cortical screw augmentation and decompressive laminotomy on the right;  Surgeon: Kary Kos, MD;  Location: [place];  Service: Neurosurgery;  Laterality: N/A;   BUNIONECTOMY     Left x1, Right x2.   CARDIAC  CATHETERIZATION     x2 both normal   CARPAL TUNNEL RELEASE Right    COLONOSCOPY     COLONOSCOPY WITH PROPOFOL N/A 06/04/2016   Procedure: COLONOSCOPY WITH PROPOFOL;  Surgeon: Lollie Sails, MD;  Location: [facility] ENDOSCOPY;  Service: Endoscopy;  Laterality: N/A;   DILATION AND CURETTAGE OF UTERUS     ECTOPIC PREGNANCY SURGERY     ESOPHAGOGASTRODUODENOSCOPY (EGD) WITH PROPOFOL N/A 06/04/2016   Procedure: ESOPHAGOGASTRODUODENOSCOPY (EGD) WITH PROPOFOL;  Surgeon: Lollie Sails, MD;  [facility]: [facility] ENDOSCOPY;  Service: Endoscopy;  Laterality: N/A;   exploratory laparoscopy     infertility surgery   HALLUX VALGUS CORRECTION Bilateral    JOINT REPLACEMENT Bilateral    partial knee   KNEE ARTHROPLASTY Left 01/27/2021   Procedure: CONVERSION OF LEFT PARTIAL KNEE TO LEFT TOTAL KNEE.;  Surgeon: Dereck Leep, MD;  Location[facility]  ORS;  Service: Orthopedics;  Laterality: Left;   KNEE ARTHROSCOPY Left 07/19/2015   Procedure: Arthroscopic limited synovectomy along with lateral compartment and retropatellar chondral debridement plus removal of several small loose bodies;  Surgeon: Leanor Kail, MD;  Location: [place];  Service: Orthopedics;  Laterality: Left;  1ST CASE PER CECE   LAMINECTOMY WITH POSTERIOR LATERAL ARTHRODESIS LEVEL 2 N/A 10/09/2022   Procedure: LAMINECTOMY WITH POSTERIOR LATERAL ARTHRODESIS LEVEL TWO;  Surgeon: Kary Kos, MD;  Location: [place];  Service: Neurosurgery;  Laterality: N/A;   LUMBAR LAMINECTOMY/DECOMPRESSION MICRODISCECTOMY Right 04/29/2020   Procedure: Microdiscectomy - Lumbar Three-Lumbar Four - Lumbar Four-Lumbar Five - right;  Surgeon: Kary Kos, MD;  Location: [place];  Service: Neurosurgery;  Laterality: Right;  Microdiscectomy - Lumbar Three-Lumbar Four - Lumbar Four-Lumbar Five - right   MEDIAL PARTIAL KNEE REPLACEMENT Bilateral    POSTERIOR LUMBAR FUSION  02/2022   L3-4 and L4-5   SHOULDER SURGERY Right    TOTAL HIP ARTHROPLASTY Left 11/25/2015    Procedure: TOTAL HIP ARTHROPLASTY;  Surgeon: Dereck Leep, MD;  Location: [facility] ORS;  Service: Orthopedics;  Laterality: Left;   TRIGGER FINGER RELEASE Left    Family History  Problem Relation Age of Onset   Heart failure Mother    COPD Mother    Hypertension Mother    Cancer Father    Heart attack Brother    Heart attack Brother 52   Heart attack Brother    Heart disease Brother    Social History   Socioeconomic History   Marital status: Married    Spouse name: Glo Herring   Number of children: 1   Years of education: Not on file   Highest education level: Not on file  Occupational History   Occupation: Part time  Tobacco Use   Smoking status: Former    Packs/day: 1.50    Years: 30.00    Total pack years: 45.00    Types: Cigarettes    Quit date: 11/12/1990    Years since quitting: 31.9   Smokeless tobacco: Never  Vaping Use   Vaping Use: Never used  Substance and Sexual Activity   Alcohol use: Yes    Alcohol/week: 1.0 standard drink of alcohol    Types: 1 Standard drinks or equivalent per week    Comment: OCC [place], maybe 1 drink a week   Drug use: No   Sexual activity: Not Currently  Other Topics Concern   Not on file  Social History Narrative   Not on file   Social Determinants of Health   Financial Resource Strain: Low Risk  (10/16/2022)   Overall Financial Resource Strain (CARDIA)    Difficulty of Paying Living Expenses: Not hard at all  Food Insecurity: No Food Insecurity (10/12/2022)   Hunger Vital Sign    Worried About Running Out of Food in the Last Year: Never true    Ran Out of Food in the Last Year: Never true  Transportation Needs: No Transportation Needs (10/12/2022)   PRAPARE - Hydrologist (Medical): No    Lack of Transportation (Non-Medical): No  Physical Activity: Insufficiently Active (10/16/2022)   Exercise Vital Sign    Days of Exercise per Week: 5 days    Minutes of Exercise per Session: 10 min   Stress: No Stress Concern Present (10/16/2022)   [place]    Feeling of Stress : Not at all  Social  Connections: Moderately Integrated (10/16/2022)   Social Connection and Isolation Panel [NHANES]    Frequency of Communication with Friends and Family: More than three times a week    Frequency of Social Gatherings with Friends and Family: Once a week    Attends Religious Services: Never    Database administrator or Organizations: No    Attends Engineer, structural: More than 4 times per year    Marital Status: Married    Tobacco Counseling Not applicable   Clinical Intake:     Pain : 0-10 Pain Score: 2  Pain Type: Acute pain Pain Location: Back Pain Orientation: Lower Pain Descriptors / Indicators: Aching Pain Onset: More than a month ago Pain Frequency: Constant     Nutritional Status: BMI > 30  Obese Nutritional Risks: None Diabetes: No  How often do you need to have someone help you when you read instructions, pamphlets, or other written materials from your doctor or pharmacy?: 1 - Never  Diabetic?No   Interpreter Needed?: No  Information entered by :: Laurel Dimmer, CMA   Activities of Daily Living    10/16/2022    8:24 AM 10/10/2022    1:55 PM  In your present state of health, do you have any difficulty performing the following activities:  Hearing? 1   Comment deaf Right ear   Vision? 0   Difficulty concentrating or making decisions? 1   Walking or climbing stairs? 0   Dressing or bathing? 0   Doing errands, shopping? 0 0    Patient Care Team: Reubin Milan, MD as PCP - General (Internal Medicine) Debbe Odea, MD as PCP - Cardiology (Cardiology) Donalee Citrin, MD as Consulting Physician (Neurosurgery) Pasty Arch (Inactive) as Referring Physician (Gastroenterology) Lonell Face, MD as Consulting Physician (Neurology) Ernest Pine, Illene Labrador, MD (Orthopedic  Surgery)  Indicate any recent Medical Services you may have received from other than [facility] providers in the past year (date may be approximate).  The pt [facility] by Sherald Hess, Md on 09/22/2022 for Lumbar radiculopathy.     Assessment:   This is a routine wellness examination for Katrina Poole.  Hearing/Vision screen Hearing Screening - Comments:: Pt states she is deaf in right ear but hearing okay in left ear despite constant white noise; does not tolerate hearing aids  Vision Screening - Comments:: vision screenings done by Dr. Clydene Pugh  Dietary issues and exercise activities discussed: Current Exercise Habits: The patient does not participate in regular exercise at present, Exercise limited by: orthopedic condition(s);neurologic condition(s)   Goals Addressed             This Visit's Progress    Stay Active and Independent-Low Back Pain          Why is this important?   Regular activity or exercise is important to managing back pain.  Activity helps to keep your muscles strong.  You will sleep better and feel more relaxed.  You will have more energy and feel less stressed.  If you are not active now, start slowly. Little changes make a big difference.  Rest, but not too much.  Stay as active as you can and listen to your body's signals.            Depression Screen    10/16/2022    8:24 AM 04/22/2022    8:30 AM 10/23/2021    8:53 AM 10/13/2021    8:37 AM 07/01/2021    1:21  PM 04/17/2021    8:48 AM 12/26/2020    8:33 AM  PHQ 2/9 Scores  PHQ - 2 Score 0 1 0 0 0 0 0  PHQ- 9 Score  2 1  1 1  0    Fall Risk    10/16/2022    8:24 AM 04/22/2022    8:30 AM 10/23/2021    8:54 AM 10/13/2021    8:38 AM 07/01/2021    1:21 PM  Fall Risk   Falls in the past year? 0 0 0 0 1  Number falls in past yr: 0 0 0 0 1  Injury with Fall? 0 0 0 0 0  Risk for fall due to : No Fall Risks No Fall Risks No Fall Risks Orthopedic patient No Fall Risks  Follow up  Falls evaluation completed Falls evaluation completed Falls evaluation completed Falls prevention discussed Falls evaluation completed    FALL RISK PREVENTION PERTAINING TO THE HOME:  Any stairs in or around the home? Yes  If so, are there any without handrails? No  Home free of loose throw rugs in walkways, pet beds, electrical cords, etc? Yes  Adequate lighting in your home to reduce risk of falls? Yes   ASSISTIVE DEVICES UTILIZED TO PREVENT FALLS:  Life alert? No  Use of a cane, walker or w/c? Yes  Grab bars in the bathroom? Yes  Shower chair or bench in shower? Yes  Elevated toilet seat or a handicapped toilet? Yes   TIMED UP AND GO:  Was the test performed?  unable to perform, virtual appointment  .  Cognitive Function:        10/16/2022    8:25 AM  6CIT Screen  What Year? 0 points  What month? 0 points  What time? 0 points  Count back from 20 0 points  Months in reverse 0 points  Repeat phrase 0 points  Total Score 0 points    Immunizations Immunization History  Administered Date(s) Administered   Moderna Sars-Covid-2 Vaccination 11/19/2019, 12/18/2019, 08/21/2020   Pneumococcal Conjugate-13 12/26/2013   Pneumococcal Polysaccharide-23 01/22/2015   Tdap 06/10/2019    TDAP status: Up to date  Flu Vaccine status: Declined, Education has been provided regarding the importance of this vaccine but patient still declined. Advised may receive this vaccine at local pharmacy or Health Dept. Aware to provide a copy of the vaccination record if obtained from local pharmacy or Health Dept. Verbalized acceptance and understanding.  Pneumococcal vaccine status: Up to date  Covid-19 vaccine status: Information provided on how to obtain vaccines.   Qualifies for Shingles Vaccine? Yes   Zostavax completed No   Shingrix Completed?: No.    Education has been provided regarding the importance of this vaccine. Patient has been advised to call insurance company to determine out  of pocket expense if they have not yet received this vaccine. Advised may also receive vaccine at local pharmacy or Health Dept. Verbalized acceptance and understanding.  Screening Tests Health Maintenance  Topic Date Due   Zoster Vaccines- Shingrix (1 of 2) Never done   COVID-19 Vaccine (4 - 2023-24 season) 06/05/2022   INFLUENZA VACCINE  01/03/2023 (Originally 05/05/2022)   Medicare Annual Wellness (AWV)  10/17/2023   MAMMOGRAM  11/19/2023   COLONOSCOPY (Pts 45-57yrs Insurance coverage will need to be confirmed)  06/04/2026   DTaP/Tdap/Td (2 - Td or Tdap) 06/09/2029   Pneumonia Vaccine 76+ Years old  Completed   DEXA SCAN  Completed   Hepatitis C Screening  Completed  HPV VACCINES  Aged Out    Health Maintenance  Health Maintenance Due  Topic Date Due   Zoster Vaccines- Shingrix (1 of 2) Never done   COVID-19 Vaccine (4 - 2023-24 season) 06/05/2022    Colorectal cancer screening: Type of screening: Colonoscopy. Completed 06/04/2016. Repeat every 10 years  Mammogram status: Completed 11/18/2021. Repeat every year  DEXA Scan: 05/03/2019-completed   Lung Cancer Screening: (Low Dose CT Chest recommended if Age 63-80 years, 30 pack-year currently smoking OR have quit w/in 15years.) does not qualify.   Lung Cancer Screening Referral: Not applicable   Additional Screening:  Hepatitis C Screening: does qualify; Completed 10/23/2021  Vision Screening: Recommended annual ophthalmology exams for early detection of glaucoma and other disorders of the eye. Is the patient up to date with their annual eye exam?  No , every 2 yrs  Who is the provider or what is the name of the office in which the patient attends annual eye exams? Dr. Clydene Pugh  If pt is not established with a provider, would they like to be referred to a provider to establish care? No .   Dental Screening: Recommended annual dental exams for proper oral hygiene  Community Resource Referral / Chronic Care  Management: CRR required this visit?  No   CCM required this visit?  No      Plan:     I have personally reviewed and noted the following in the patient's chart:   Medical and social history Use of alcohol, tobacco or illicit drugs  Current medications and supplements including opioid prescriptions. Patient is currently taking opioid prescriptions. Information provided to patient regarding non-opioid alternatives. Patient advised to discuss non-opioid treatment plan with their provider. Functional ability and status Nutritional status Physical activity Advanced directives List of other physicians Hospitalizations, surgeries, and ER visits in previous 12 months Vitals Screenings to include cognitive, depression, and falls Referrals and appointments  In addition, I have reviewed and discussed with patient certain preventive protocols, quality metrics, and best practice recommendations. A written personalized care plan for preventive services as well as general preventive health recommendations were provided to patient.    Katrina Poole , Thank you for taking time to come for your Medicare Wellness Visit. I appreciate your ongoing commitment to your health goals. Please review the following plan we discussed and let me know if I can assist you in the future.   These are the goals we discussed:  Goals      Increase physical activity     Recommend increasing physical activity as tolereated     Patient Stated     Patient states she would like to lose weight with healthy eating and physical activity after knee replacement in April 2022.      Stay Active and Independent-Low Back Pain        Why is this important?   Regular activity or exercise is important to managing back pain.  Activity helps to keep your muscles strong.  You will sleep better and feel more relaxed.  You will have more energy and feel less stressed.  If you are not active now, start slowly. Little changes  make a big difference.  Rest, but not too much.  Stay as active as you can and listen to your body's signals.             This is a list of the screening recommended for you and due dates:  Health Maintenance  Topic Date Due   Zoster (  Shingles) Vaccine (1 of 2) Never done   COVID-19 Vaccine (4 - 2023-24 season) 06/05/2022   Flu Shot  01/03/2023*   Medicare Annual Wellness Visit  10/17/2023   Mammogram  11/19/2023   Colon Cancer Screening  06/04/2026   DTaP/Tdap/Td vaccine (2 - Td or Tdap) 06/09/2029   Pneumonia Vaccine  Completed   DEXA scan (bone density measurement)  Completed   Hepatitis C Screening: USPSTF Recommendation to screen - Ages 3718-79 yo.  Completed   HPV Vaccine  Aged Out  *Topic was postponed. The date shown is not the original due date.     Katrina Poole, CMA   10/16/2022   Nurse Notes: Approximately 30 minute Non-Face -To-Face Medicare Wellness Visit

## 2022-10-16 NOTE — Patient Instructions (Signed)

## 2022-10-23 ENCOUNTER — Ambulatory Visit: Payer: Medicare Other | Admitting: Internal Medicine

## 2022-10-23 ENCOUNTER — Other Ambulatory Visit: Payer: Self-pay | Admitting: Internal Medicine

## 2022-10-23 DIAGNOSIS — I10 Essential (primary) hypertension: Secondary | ICD-10-CM

## 2022-10-23 NOTE — Telephone Encounter (Signed)
Requested Prescriptions  Pending Prescriptions Disp Refills   amLODipine (NORVASC) 5 MG tablet [Pharmacy Med Name: amLODIPine Besylate 5 MG Oral Tablet] 90 tablet 0    Sig: TAKE 1 TABLET BY MOUTH IN THE MORNING     Cardiovascular: Calcium Channel Blockers 2 Failed - 10/23/2022  9:37 AM      Failed - Valid encounter within last 6 months    Recent Outpatient Visits           6 months ago Prediabetes   [place] at [place], Jesse Sans, MD   1 year ago Annual physical exam   [facility] at [facility], Jesse Sans, MD   1 year ago Essential hypertension   [place] [facility] [place], Jesse Sans, MD   1 year ago Essential hypertension   [place] at [facility], Jesse Sans, MD   1 year ago Essential hypertension   [place] [facility], Jesse Sans, MD       Future Appointments             In 3 weeks Army Melia Jesse Sans, MD [place] at [facility], [facility]   In 3 months Kate Sable, MD [place] at [place] BP in normal range    BP Readings from Last 1 Encounters:  10/10/22 (!) 108/58         Passed - Last Heart Rate in normal range    Pulse Readings from Last 1 Encounters:  10/10/22 77

## 2022-11-07 ENCOUNTER — Other Ambulatory Visit: Payer: Self-pay | Admitting: Internal Medicine

## 2022-11-12 DIAGNOSIS — M4317 Spondylolisthesis, lumbosacral region: Secondary | ICD-10-CM | POA: Diagnosis not present

## 2022-11-18 ENCOUNTER — Ambulatory Visit (INDEPENDENT_AMBULATORY_CARE_PROVIDER_SITE_OTHER): Payer: Medicare Other | Admitting: Internal Medicine

## 2022-11-18 ENCOUNTER — Encounter: Payer: Self-pay | Admitting: Internal Medicine

## 2022-11-18 VITALS — BP 128/70 | HR 76 | Ht 59.0 in | Wt 209.0 lb

## 2022-11-18 DIAGNOSIS — I10 Essential (primary) hypertension: Secondary | ICD-10-CM

## 2022-11-18 DIAGNOSIS — I447 Left bundle-branch block, unspecified: Secondary | ICD-10-CM | POA: Diagnosis not present

## 2022-11-18 DIAGNOSIS — Z1231 Encounter for screening mammogram for malignant neoplasm of breast: Secondary | ICD-10-CM | POA: Diagnosis not present

## 2022-11-18 DIAGNOSIS — M5416 Radiculopathy, lumbar region: Secondary | ICD-10-CM | POA: Diagnosis not present

## 2022-11-18 NOTE — Assessment & Plan Note (Signed)
One month post lumbar fusion

## 2022-11-18 NOTE — Assessment & Plan Note (Signed)
Clinically stable exam with well controlled BP on amlodipine, lisinopril and hctz. Tolerating medications without side effects. Pt to continue current regimen and low sodium diet.

## 2022-11-18 NOTE — Assessment & Plan Note (Signed)
>>  ASSESSMENT AND PLAN FOR LUMBAR RADICULOPATHY WRITTEN ON 11/18/2022  7:50 AM BY Miaya Lafontant, LEITA DEL, MD  One month post lumbar fusion

## 2022-11-18 NOTE — Progress Notes (Signed)
Date:  11/18/2022   Name:  Katrina Poole   DOB:  03-01-1946   MRN:  [id number]   Chief Complaint: Hypertension  Mammogram: 11/2021 DEXA: 04/2019 Colonoscopy: 05/2016 repeat 10 yrs  Health Maintenance Due  Topic Date Due   Zoster Vaccines- Shingrix (1 of 2) Never done   COVID-19 Vaccine (4 - 2023-24 season) 06/05/2022    Immunization History  Administered Date(s) Administered   Moderna Sars-Covid-2 Vaccination 11/19/2019, 12/18/2019, 08/21/2020   Pneumococcal Conjugate-13 12/26/2013   Pneumococcal Polysaccharide-23 01/22/2015   Tdap 06/10/2019    Hypertension This is a chronic problem. The problem is controlled. Pertinent negatives include no chest pain, headaches, palpitations or shortness of breath. Past treatments include calcium channel blockers, ACE inhibitors and diuretics. There is no history of kidney disease or CVA.   Lumbar fusion - she is doing well s/p surgery.  Healing well, taking tramadol for pain.  Walking without assistance.   Right leg symptoms have resolved.  Lab Results  Component Value Date   NA 136 09/29/2022   K 4.0 09/29/2022   CO2 28 09/29/2022   GLUCOSE 99 09/29/2022   BUN 15 09/29/2022   CREATININE 0.86 09/29/2022   CALCIUM 9.2 09/29/2022   EGFR 71 10/23/2021   GFRNONAA >60 09/29/2022   Lab Results  Component Value Date   CHOL 211 (H) 10/23/2021   HDL 70 10/23/2021   LDLCALC 123 (H) 10/23/2021   TRIG 103 10/23/2021   CHOLHDL 3.0 10/23/2021   Lab Results  Component Value Date   TSH 1.900 10/23/2021   Lab Results  Component Value Date   HGBA1C 5.5 04/22/2022   Lab Results  Component Value Date   WBC 7.9 09/29/2022   HGB 11.2 (L) 09/29/2022   HCT 34.7 (L) 09/29/2022   MCV 90.1 09/29/2022   PLT 320 09/29/2022   Lab Results  Component Value Date   ALT 12 10/23/2021   AST 19 10/23/2021   ALKPHOS 104 10/23/2021   BILITOT 0.4 10/23/2021   No results found for: "25OHVITD2", "25OHVITD3", "VD25OH"   Review of Systems   Constitutional:  Negative for fatigue and unexpected weight change.  HENT:  Negative for nosebleeds.   Eyes:  Negative for visual disturbance.  Respiratory:  Negative for cough, chest tightness, shortness of breath and wheezing.   Cardiovascular:  Negative for chest pain, palpitations and leg swelling.  Gastrointestinal:  Negative for abdominal pain, constipation and diarrhea.  Musculoskeletal:  Positive for back pain.  Neurological:  Negative for dizziness, weakness, light-headedness and headaches.  Psychiatric/Behavioral:  Negative for dysphoric mood and sleep disturbance. The patient is not nervous/anxious.     Patient Active Problem List   Diagnosis Date Noted   Left bundle branch block 11/18/2022   DDD (degenerative disc disease), lumbar 10/09/2022   Lumbar spondylosis 09/17/2022   Prediabetes 04/22/2022   Spinal stenosis at L4-L5 level 02/09/2022   Lumbar radiculopathy 07/21/2021   Heart palpitations 07/01/2021   Mixed hyperlipidemia 07/01/2021   Generalized anxiety disorder 07/01/2021   Body mass index (BMI) 40.0-44.9, adult ([place]) 06/12/2021   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   Essential hypertension 08/16/2020   Meniere's disease 08/16/2020   Restless leg syndrome 08/16/2020   Primary insomnia 08/16/2020   Mild intermittent asthma without complication [id number]   Diverticulosis of colon 08/16/2020   HNP (herniated nucleus pulposus), lumbar 04/29/2020   Gastroesophageal reflux disease without esophagitis 01/24/2020   Spondylosis of cervical spine 03/16/2018   Osteoarthritis of knee 02/06/2018  S/P total hip arthroplasty 11/25/2015   Status post arthroscopic surgery of left knee 08/15/2015    Allergies  Allergen Reactions   Azithromycin Hives and Other (See Comments)   Sulfa Antibiotics Other (See Comments) and Rash    Hallucinations   Oxycodone Other (See Comments)    Makes head foggy and interferes with Meniere's syndrome.      Sulfacetamide    Beef-Derived Products Other (See Comments)    Tears up my stomach.    Past Surgical History:  Procedure Laterality Date   ABDOMINAL EXPOSURE N/A 10/09/2022   Procedure: ABDOMINAL EXPOSURE;  Surgeon: Marty Heck, MD;  Location: [place];  Service: Vascular;  Laterality: N/A;   ANTERIOR CERVICAL DECOMP/DISCECTOMY FUSION N/A 03/16/2018   Procedure: Anterior Cervical Discectomy Fusion - Cervical Three- Cervical Four, Cervical Four- Cervical Five, Cervical Five-Cervical Six, Cervical Six- Cerival Seven;  Surgeon: Kary Kos, MD;  Location: [place];  Service: Neurosurgery;  Laterality: N/A;  Anterior Cervical Discectomy Fusion - Cervical Three- Cervical Four, Cervical Four- Cervical Five, Cervical Five-Cervical Six, Cervical Six- Ceriva   ANTERIOR LUMBAR FUSION N/A 10/09/2022   Procedure: Anteriorr Lumbar Interbody Fusion - Level Five-Sacral One  with posterior cortical screw augmentation and decompressive laminotomy on the right;  Surgeon: Kary Kos, MD;  Location: [place];  Service: Neurosurgery;  Laterality: N/A;   BUNIONECTOMY     Left x1, Right x2.   CARDIAC CATHETERIZATION     x2 both normal   CARPAL TUNNEL RELEASE Right    COLONOSCOPY     COLONOSCOPY WITH PROPOFOL N/A 06/04/2016   Procedure: COLONOSCOPY WITH PROPOFOL;  Surgeon: Lollie Sails, MD;  Location: [facility] ENDOSCOPY;  Service: Endoscopy;  Laterality: N/A;   DILATION AND CURETTAGE OF UTERUS     ECTOPIC PREGNANCY SURGERY     ESOPHAGOGASTRODUODENOSCOPY (EGD) WITH PROPOFOL N/A 06/04/2016   Procedure: ESOPHAGOGASTRODUODENOSCOPY (EGD) WITH PROPOFOL;  Surgeon: Lollie Sails, MD;  Location: [facility] ENDOSCOPY;  Service: Endoscopy;  Laterality: N/A;   exploratory laparoscopy     infertility surgery   HALLUX VALGUS CORRECTION Bilateral    JOINT REPLACEMENT Bilateral    partial knee   KNEE ARTHROPLASTY Left 01/27/2021   Procedure: CONVERSION OF LEFT PARTIAL KNEE TO LEFT TOTAL KNEE.;  Surgeon: Dereck Leep, MD;   Location: [facility];  Service: Orthopedics;  Laterality: Left;   KNEE ARTHROSCOPY Left 07/19/2015   Procedure: Arthroscopic limited synovectomy along with lateral compartment and retropatellar chondral debridement plus removal of several small loose bodies;  Surgeon: Leanor Kail, MD;  Location: [place];  Service: Orthopedics;  Laterality: Left;  1ST CASE PER CECE   LAMINECTOMY WITH POSTERIOR LATERAL ARTHRODESIS LEVEL 2 N/A 10/09/2022   Procedure: LAMINECTOMY WITH POSTERIOR LATERAL ARTHRODESIS LEVEL TWO;  Surgeon: Kary Kos, MD;  Location: [place];  Service: Neurosurgery;  Laterality: N/A;   LUMBAR LAMINECTOMY/DECOMPRESSION MICRODISCECTOMY Right 04/29/2020   Procedure: Microdiscectomy - Lumbar Three-Lumbar Four - Lumbar Four-Lumbar Five - right;  Surgeon: Kary Kos, MD;  Location: [place];  Service: Neurosurgery;  Laterality: Right;  Microdiscectomy - Lumbar Three-Lumbar Four - Lumbar Four-Lumbar Five - right   MEDIAL PARTIAL KNEE REPLACEMENT Bilateral    POSTERIOR LUMBAR FUSION  02/2022   L3-4 and L4-5   SHOULDER SURGERY Right    TOTAL HIP ARTHROPLASTY Left 11/25/2015   Procedure: TOTAL HIP ARTHROPLASTY;  Surgeon: Dereck Leep, MD;  Location: [facility];  Service: Orthopedics;  Laterality: Left;   TRIGGER FINGER RELEASE Left     Social History  Tobacco Use   Smoking status: Former    Packs/day: 1.50    Years: 30.00    Total pack years: 45.00    Types: Cigarettes    Quit date: 11/12/1990    Years since quitting: 32.0   Smokeless tobacco: Never  Vaping Use   Vaping Use: Never used  Substance Use Topics   Alcohol use: Yes    Alcohol/week: 1.0 standard drink of alcohol    Types: 1 Standard drinks or equivalent per week    Comment: OCC [place], maybe 1 drink a week   Drug use: No     Medication list has been reviewed and updated.  Current Meds  Medication Sig   acetaminophen (TYLENOL) 500 MG tablet Take 2 tablets (1,000 mg total) by mouth every 6 (six) hours as  needed (DO NOT EXCEED 4000 MG DAILY FROM ALL SOURCES).   acetaminophen-codeine (TYLENOL #3) 300-30 MG tablet Take 1-2 tablets by mouth every 6 (six) hours as needed for moderate pain.   albuterol (VENTOLIN HFA) 108 (90 Base) MCG/ACT inhaler Inhale 2 puffs into the lungs every 6 (six) hours as needed for wheezing or shortness of breath.   alum hydroxide-mag trisilicate (GAVISCON) [id number] MG CHEW chewable tablet Chew 1 tablet by mouth daily as needed for indigestion or heartburn.    amLODipine (NORVASC) 5 MG tablet TAKE 1 TABLET BY MOUTH IN THE MORNING   cholecalciferol (VITAMIN D3) 25 MCG (1000 UNIT) tablet Take 1,000 Units by mouth at bedtime.   clonazePAM (KLONOPIN) 0.5 MG tablet Take 1 tablet (0.5 mg total) by mouth at bedtime as needed for anxiety.   fosinopril (MONOPRIL) 40 MG tablet Take 1 tablet by mouth once daily   gabapentin (NEURONTIN) 300 MG capsule TAKE 1 TO 2 CAPSULES BY MOUTH AT BEDTIME   hydrochlorothiazide (HYDRODIURIL) 25 MG tablet TAKE 1 TABLET BY MOUTH ONCE DAILY IN THE MORNING   meclizine (ANTIVERT) 12.5 MG tablet Take 12.5 mg by mouth 3 (three) times daily as needed for dizziness. Dr. Marlyce Huge   meloxicam (MOBIC) 7.5 MG tablet Take 1 tablet by mouth twice daily   Multiple Vitamin (MULTIVITAMIN) tablet Take 1 tablet by mouth every morning.   omeprazole (PRILOSEC) 20 MG capsule TAKE 1 CAPSULE BY MOUTH TWICE DAILY BEFORE A MEAL   Probiotic Product (PROBIOTIC DAILY PO) Take 1 capsule by mouth daily.   traMADol (ULTRAM) 50 MG tablet Take 50 mg by mouth every 6 (six) hours as needed for moderate pain.   zolpidem (AMBIEN) 5 MG tablet Take 1 tablet (5 mg total) by mouth at bedtime.       11/18/2022    8:58 AM 04/22/2022    8:30 AM 10/23/2021    8:54 AM 07/01/2021    1:21 PM  GAD 7 : Generalized Anxiety Score  Nervous, Anxious, on Edge 0 1 0 1  Control/stop worrying 0 1 0 1  Worry too much - different things 0 1 0 1  Trouble relaxing 0 0 1 1  Restless 0 1 1 1  $ Easily annoyed or  irritable 0 2 0 2  Afraid - awful might happen 0 0 1 0  Total GAD 7 Score 0 6 3 7  $ Anxiety Difficulty Not difficult at all Not difficult at all  Not difficult at all       11/18/2022    8:58 AM 10/16/2022    8:24 AM 04/22/2022    8:30 AM  Depression screen PHQ 2/9  Decreased Interest 0 0 0  Down, Depressed, Hopeless 0 0 1  PHQ - 2 Score 0 0 1  Altered sleeping 0  0  Tired, decreased energy 0  0  Change in appetite 0  0  Feeling bad or failure about yourself  0  0  Trouble concentrating 0  0  Moving slowly or fidgety/restless 0  1  Suicidal thoughts 0  0  PHQ-9 Score 0  2  Difficult doing work/chores Not difficult at all  Not difficult at all    BP Readings from Last 3 Encounters:  11/18/22 128/70  10/10/22 (!) 108/58  10/07/22 (!) 144/80    Physical Exam Vitals and nursing note reviewed.  Constitutional:      General: She is not in acute distress.    Appearance: Normal appearance. She is well-developed.  HENT:     Head: Normocephalic and atraumatic.  Cardiovascular:     Rate and Rhythm: Normal rate and regular rhythm.  Pulmonary:     Effort: Pulmonary effort is normal. No respiratory distress.     Breath sounds: No wheezing or rhonchi.  Musculoskeletal:     Cervical back: Normal range of motion.     Right lower leg: No edema.     Left lower leg: No edema.  Lymphadenopathy:     Cervical: No cervical adenopathy.  Skin:    General: Skin is warm and dry.     Findings: No rash.  Neurological:     Mental Status: She is alert and oriented to person, place, and time.  Psychiatric:        Mood and Affect: Mood normal.        Behavior: Behavior normal.     Wt Readings from Last 3 Encounters:  11/18/22 209 lb (94.8 kg)  10/09/22 210 lb (95.3 kg)  10/07/22 210 lb 6.4 oz (95.4 kg)    BP 128/70   Pulse 76   Ht 4' 11"$  (1.499 m)   Wt 209 lb (94.8 kg)   SpO2 98%   BMI 42.21 kg/m   Assessment and Plan: Problem List Items Addressed This Visit        Cardiovascular and Mediastinum   Left bundle branch block    Seen by Cardiology ECHO planned for follow up      Essential hypertension - Primary (Chronic)    Clinically stable exam with well controlled BP on amlodipine, lisinopril and hctz. Tolerating medications without side effects. Pt to continue current regimen and low sodium diet.         Nervous and Auditory   Lumbar radiculopathy    One month post lumbar fusion      Other Visit Diagnoses     Encounter for screening mammogram for breast cancer       she will schedule at [facility]  if not covered there, will call here for an order        Partially dictated using Dragon software. Any errors are unintentional.  Halina Maidens, MD [facility]  11/18/2022

## 2022-11-18 NOTE — Assessment & Plan Note (Signed)
Seen by Cardiology ECHO planned for follow up

## 2022-11-18 NOTE — Patient Instructions (Signed)
Call [facility] asap and schedule mammogram.

## 2022-11-24 DIAGNOSIS — Z1231 Encounter for screening mammogram for malignant neoplasm of breast: Secondary | ICD-10-CM | POA: Diagnosis not present

## 2022-12-07 ENCOUNTER — Encounter: Payer: Self-pay | Admitting: Internal Medicine

## 2022-12-07 ENCOUNTER — Other Ambulatory Visit: Payer: Self-pay | Admitting: Internal Medicine

## 2022-12-07 DIAGNOSIS — M1712 Unilateral primary osteoarthritis, left knee: Secondary | ICD-10-CM

## 2022-12-07 MED ORDER — MELOXICAM 7.5 MG PO TABS: 7.5000 mg | ORAL_TABLET | Freq: Two times a day (BID) | ORAL | 0 refills | Status: DC

## 2022-12-17 DIAGNOSIS — M4317 Spondylolisthesis, lumbosacral region: Secondary | ICD-10-CM | POA: Diagnosis not present

## 2022-12-23 ENCOUNTER — Other Ambulatory Visit: Payer: Self-pay | Admitting: Internal Medicine

## 2022-12-23 DIAGNOSIS — I10 Essential (primary) hypertension: Secondary | ICD-10-CM

## 2022-12-24 NOTE — Telephone Encounter (Signed)
Requested Prescriptions  Pending Prescriptions Disp Refills   fosinopril (MONOPRIL) 40 MG tablet [Pharmacy Med Name: Fosinopril Sodium 40 MG Oral Tablet] 90 tablet 0    Sig: Take 1 tablet by mouth once daily     Cardiovascular:  ACE Inhibitors Passed - 12/23/2022  9:30 AM      Passed - Cr in normal range and within 180 days    Creatinine, Ser  Date Value Ref Range Status  09/29/2022 0.86 0.44 - 1.00 mg/dL Final         Passed - K in normal range and within 180 days    Potassium  Date Value Ref Range Status  09/29/2022 4.0 3.5 - 5.1 mmol/L Final         Passed - Patient is not pregnant      Passed - Last BP in normal range    BP Readings from Last 1 Encounters:  11/18/22 128/70         Passed - Valid encounter within last 6 months    Recent Outpatient Visits           1 month ago Essential hypertension   [place] at [facility], Jesse Sans, MD   8 months ago Prediabetes   [facility], Jesse Sans, MD   1 year ago Annual physical exam   [place] at [facility], Jesse Sans, MD   1 year ago Essential hypertension   [place] at [facility], Jesse Sans, MD   1 year ago Essential hypertension   [place] at [facility], Jesse Sans, MD       Future Appointments             In 1 month [place], Aaron Edelman, MD [place] at [place]   In 4 months [place] Melia, Jesse Sans, MD [place] at [facility], [facility]

## 2023-01-04 DIAGNOSIS — I429 Cardiomyopathy, unspecified: Secondary | ICD-10-CM

## 2023-01-04 HISTORY — DX: Cardiomyopathy, unspecified: I42.9

## 2023-01-10 ENCOUNTER — Other Ambulatory Visit: Payer: Self-pay | Admitting: Internal Medicine

## 2023-01-12 DIAGNOSIS — M25551 Pain in right hip: Secondary | ICD-10-CM | POA: Diagnosis not present

## 2023-01-12 DIAGNOSIS — M5459 Other low back pain: Secondary | ICD-10-CM | POA: Diagnosis not present

## 2023-01-13 ENCOUNTER — Other Ambulatory Visit: Payer: Self-pay | Admitting: Internal Medicine

## 2023-01-13 ENCOUNTER — Encounter: Payer: Self-pay | Admitting: Internal Medicine

## 2023-01-13 DIAGNOSIS — Z792 Long term (current) use of antibiotics: Secondary | ICD-10-CM

## 2023-01-13 MED ORDER — CEPHALEXIN 500 MG PO CAPS: ORAL_CAPSULE | ORAL | 1 refills | Status: DC

## 2023-01-14 ENCOUNTER — Ambulatory Visit (INDEPENDENT_AMBULATORY_CARE_PROVIDER_SITE_OTHER): Payer: Medicare Other | Admitting: Family Medicine

## 2023-01-14 ENCOUNTER — Encounter: Payer: Self-pay | Admitting: Family Medicine

## 2023-01-14 VITALS — BP 128/76 | HR 87

## 2023-01-14 DIAGNOSIS — J029 Acute pharyngitis, unspecified: Secondary | ICD-10-CM | POA: Diagnosis not present

## 2023-01-14 DIAGNOSIS — M25551 Pain in right hip: Secondary | ICD-10-CM | POA: Diagnosis not present

## 2023-01-14 DIAGNOSIS — M5459 Other low back pain: Secondary | ICD-10-CM | POA: Diagnosis not present

## 2023-01-14 MED ORDER — AZELASTINE HCL 0.1 % NA SOLN: 2.0000 | Freq: Two times a day (BID) | NASAL | 0 refills | Status: DC

## 2023-01-14 MED ORDER — PROMETHAZINE-DM 6.25-15 MG/5ML PO SYRP: 5.0000 mL | ORAL_SOLUTION | Freq: Four times a day (QID) | ORAL | 0 refills | Status: DC | PRN

## 2023-01-14 NOTE — Patient Instructions (Addendum)
-   Use Astelin, 2 sprays each nostril twice daily x 7 days then as-needed - Use promethazine-DM cough syrup as-needed - Contact us by midweek next week if symptoms persist without improvement for next steps

## 2023-01-14 NOTE — Assessment & Plan Note (Signed)
4 day history of increased congestion, increased temp from her baseline to 98.8, sore throat. Has been using OTC throat sprays and drops with minimal response.  Exam features are most consistent with rhinitis and allergic pharyngitis.  - Start Astelin course - PRN antitussive - If still symptomatic by mid next week, was advised to contact us for escalation of Rx management, consideration of antibiotics

## 2023-01-14 NOTE — Progress Notes (Signed)
     Primary Care / Sports Medicine Office Visit  Patient Information:  Patient ID: Katrina Poole, female DOB: 1945-10-12 Poole: 77 y.o. MRN: [id number]   Katrina Poole is a pleasant 77 y.o. female presenting with the following:  Chief Complaint  Patient presents with   Sinusitis    Negative covid test, day 4, was outside wating for 2 hours and has been feeling like cold since, states she has been running a fever "98.8" moved here 2002 from [place]     Vitals:   01/14/23 1322  BP: 128/76  Pulse: 87  SpO2: 97%   Vitals:   There is no height or weight on file to calculate BMI.  No results found.   Independent interpretation of notes and tests performed by another provider:   None  Procedures performed:   None  Pertinent History, Exam, Impression, and Recommendations:   Katrina Poole was seen today for sinusitis.  Allergic pharyngitis Assessment & Plan: 4 day history of increased congestion, increased temp from her baseline to 98.8, sore throat. Has been using OTC throat sprays and drops with minimal response.  Exam features are most consistent with rhinitis and allergic pharyngitis.  - Start Astelin course - PRN antitussive - If still symptomatic by mid next week, was advised to contact us for escalation of Rx management, consideration of antibiotics  Orders: -     Azelastine HCl; Place 2 sprays into both nostrils 2 (two) times daily. Use in each nostril as directed  Dispense: 30 mL; Refill: 0 -     Promethazine-DM; Take 5 mLs by mouth 4 (four) times daily as needed for cough.  Dispense: 118 mL; Refill: 0     Orders & Medications Meds ordered this encounter  Medications   azelastine (ASTELIN) 0.1 % nasal spray    Sig: Place 2 sprays into both nostrils 2 (two) times daily. Use in each nostril as directed    Dispense:  30 mL    Refill:  0    Use generic Astelin   promethazine-dextromethorphan (PROMETHAZINE-DM) 6.25-15 MG/5ML syrup    Sig: Take 5 mLs by mouth  4 (four) times daily as needed for cough.    Dispense:  118 mL    Refill:  0   No orders of the defined types were placed in this encounter.    No follow-ups on file.     Jerrol Banana, MD, [facility]   Primary Care Sports Medicine Primary Care and Sports Medicine at [facility]

## 2023-01-19 DIAGNOSIS — M25551 Pain in right hip: Secondary | ICD-10-CM | POA: Diagnosis not present

## 2023-01-19 DIAGNOSIS — M5459 Other low back pain: Secondary | ICD-10-CM | POA: Diagnosis not present

## 2023-01-19 DIAGNOSIS — M4317 Spondylolisthesis, lumbosacral region: Secondary | ICD-10-CM | POA: Diagnosis not present

## 2023-01-20 ENCOUNTER — Ambulatory Visit: Payer: Medicare Other

## 2023-01-20 ENCOUNTER — Other Ambulatory Visit: Payer: Self-pay | Admitting: Internal Medicine

## 2023-01-20 DIAGNOSIS — I10 Essential (primary) hypertension: Secondary | ICD-10-CM

## 2023-01-21 DIAGNOSIS — M25551 Pain in right hip: Secondary | ICD-10-CM | POA: Diagnosis not present

## 2023-01-21 DIAGNOSIS — M5459 Other low back pain: Secondary | ICD-10-CM | POA: Diagnosis not present

## 2023-01-22 ENCOUNTER — Ambulatory Visit: Payer: Medicare Other | Attending: Cardiology

## 2023-01-22 DIAGNOSIS — I447 Left bundle-branch block, unspecified: Secondary | ICD-10-CM

## 2023-01-22 DIAGNOSIS — Z01810 Encounter for preprocedural cardiovascular examination: Secondary | ICD-10-CM

## 2023-01-23 LAB — ECHOCARDIOGRAM COMPLETE
AR max vel: 2.43 cm2
AV Area VTI: 2.37 cm2
AV Area mean vel: 2.3 cm2
AV Mean grad: 6 mmHg
AV Peak grad: 11.2 mmHg
Ao pk vel: 1.67 m/s
Area-P 1/2: 3.72 cm2
Calc EF: 30.8 %
S' Lateral: 5.3 cm
Single Plane A2C EF: 31.8 %
Single Plane A4C EF: 29.7 %

## 2023-01-24 ENCOUNTER — Other Ambulatory Visit: Payer: Self-pay | Admitting: Internal Medicine

## 2023-01-24 DIAGNOSIS — F5101 Primary insomnia: Secondary | ICD-10-CM

## 2023-01-25 ENCOUNTER — Other Ambulatory Visit: Payer: Self-pay

## 2023-01-25 ENCOUNTER — Telehealth: Payer: Self-pay | Admitting: Cardiology

## 2023-01-25 MED ORDER — CARVEDILOL 6.25 MG PO TABS: 6.2500 mg | ORAL_TABLET | Freq: Two times a day (BID) | ORAL | 3 refills | Status: DC

## 2023-01-25 MED ORDER — ENTRESTO 24-26 MG PO TABS: 1.0000 | ORAL_TABLET | Freq: Two times a day (BID) | ORAL | 3 refills | Status: DC

## 2023-01-25 NOTE — Telephone Encounter (Signed)
Spoke with Dr. Azucena Cecil -  He verbally stated to stop fosinopril since starting entresto.   Called Pharmacy back and made them aware.

## 2023-01-25 NOTE — Telephone Encounter (Signed)
Pt c/o medication issue:  1. Name of Medication: sacubitril-valsartan (ENTRESTO) 24-26 MG   2. How are you currently taking this medication (dosage and times per day)? N/A  3. Are you having a reaction (difficulty breathing--STAT)? N/A  4. What is your medication issue? Wanting to confirm patient should be on this medication due to the patient actively being on fosinopril (MONOPRIL) 40 MG tablet. Please advise.

## 2023-01-26 DIAGNOSIS — M25551 Pain in right hip: Secondary | ICD-10-CM | POA: Diagnosis not present

## 2023-01-26 DIAGNOSIS — M5459 Other low back pain: Secondary | ICD-10-CM | POA: Diagnosis not present

## 2023-01-27 ENCOUNTER — Encounter: Payer: Self-pay | Admitting: Cardiology

## 2023-01-27 ENCOUNTER — Other Ambulatory Visit
Admission: RE | Admit: 2023-01-27 | Discharge: 2023-01-27 | Disposition: A | Discharge status: Home / Self Care | Payer: Medicare Other | Source: Ambulatory Visit | Attending: Cardiology | Admitting: Cardiology

## 2023-01-27 ENCOUNTER — Ambulatory Visit: Payer: Medicare Other | Attending: Cardiology | Admitting: Cardiology

## 2023-01-27 VITALS — BP 165/97 | HR 73 | Ht 59.0 in

## 2023-01-27 DIAGNOSIS — I429 Cardiomyopathy, unspecified: Secondary | ICD-10-CM | POA: Diagnosis not present

## 2023-01-27 DIAGNOSIS — I10 Essential (primary) hypertension: Secondary | ICD-10-CM | POA: Diagnosis not present

## 2023-01-27 LAB — BASIC METABOLIC PANEL
Anion gap: 11 (ref 5–15)
BUN: 25 mg/dL — ABNORMAL HIGH (ref 8–23)
CO2: 27 mmol/L (ref 22–32)
Calcium: 9.2 mg/dL (ref 8.9–10.3)
Chloride: 99 mmol/L (ref 98–111)
Creatinine, Ser: 0.82 mg/dL (ref 0.44–1.00)
GFR, Estimated: >60 mL/min (ref >60)
Glucose, Bld: 104 mg/dL — ABNORMAL HIGH (ref 70–99)
Potassium: 3.7 mmol/L (ref 3.5–5.1)
Sodium: 137 mmol/L (ref 135–145)

## 2023-01-27 LAB — CBC
HCT: 34.9 % — ABNORMAL LOW (ref 36.0–46.0)
Hemoglobin: 11 g/dL — ABNORMAL LOW (ref 12.0–15.0)
MCH: 27.2 pg (ref 26.0–34.0)
MCHC: 31.5 g/dL (ref 30.0–36.0)
MCV: 86.4 fL (ref 80.0–100.0)
Platelets: 291 10*3/uL (ref 150–400)
RBC: 4.04 MIL/uL (ref 3.87–5.11)
RDW: 14.9 % (ref 11.5–15.5)
WBC: 6.8 10*3/uL (ref 4.0–10.5)
nRBC: 0 % (ref 0.0–0.2)

## 2023-01-27 NOTE — Patient Instructions (Signed)
Medication Instructions:   Your physician recommends that you continue on your current medications as directed. Please refer to the Current Medication list given to you today.  *If you need a refill on your cardiac medications before your next appointment, please call your pharmacy*   Lab Work:  Your physician recommends you go to the medical mall for lab work.   If you have labs (blood work) drawn today and your tests are completely normal, you will receive your results only by: MyChart Message (if you have MyChart) OR A paper copy in the mail If you have any lab test that is abnormal or we need to change your treatment, we will call you to review the results.   Testing/Procedures:      [place] [facility] DEPT OF [facility] [place] [address] [place] [address] Dept: [phone] Loc: [phone]  Katrina Poole  01/27/2023  You are scheduled for a Cardiac Catheterization on Monday, May 6 with Dr. Lorine Bears.  1. Please arrive at the [facility] entrance, and check in at registration: [address] [place], [place], [place] [address] at 8:30 AM (This time is two hours before your procedure to ensure your preparation). Free valet parking service is available.   Special note: Every effort is made to have your procedure done on time. Please understand that emergencies sometimes delay scheduled procedures.  2. Diet: Do not eat solid foods after midnight.  The patient may have clear liquids until 5am upon the day of the procedure.  3. Labs: You will need to have blood drawn on Today. You do not need to be fasting.  4. Medication instructions in preparation for your procedure:     Current Outpatient Medications (Cardiovascular):    carvedilol (COREG) 6.25 MG tablet, Take 1 tablet (6.25 mg total) by mouth 2 (two) times daily.   sacubitril-valsartan  (ENTRESTO) 24-26 MG, Take 1 tablet by mouth 2 (two) times daily.  Current Outpatient Medications (Respiratory):    albuterol (VENTOLIN HFA) 108 (90 Base) MCG/ACT inhaler, Inhale 2 puffs into the lungs every 6 (six) hours as needed for wheezing or shortness of breath.   azelastine (ASTELIN) 0.1 % nasal spray, Place 2 sprays into both nostrils 2 (two) times daily. Use in each nostril as directed   promethazine-dextromethorphan (PROMETHAZINE-DM) 6.25-15 MG/5ML syrup, Take 5 mLs by mouth 4 (four) times daily as needed for cough.  Current Outpatient Medications (Analgesics):    acetaminophen (TYLENOL) 500 MG tablet, Take 2 tablets (1,000 mg total) by mouth every 6 (six) hours as needed (DO NOT EXCEED 4000 MG DAILY FROM ALL SOURCES).   acetaminophen-codeine (TYLENOL #3) 300-30 MG tablet, Take 1-2 tablets by mouth every 6 (six) hours as needed for moderate pain.   meloxicam (MOBIC) 7.5 MG tablet, Take 1 tablet (7.5 mg total) by mouth 2 (two) times daily.   traMADol (ULTRAM) 50 MG tablet, Take 50 mg by mouth every 6 (six) hours as needed for moderate pain.   Current Outpatient Medications (Other):    alum hydroxide-mag trisilicate (GAVISCON) 80-20 MG CHEW chewable tablet, Chew 1 tablet by mouth daily as needed for indigestion or heartburn.    cephALEXin (KEFLEX) 500 MG capsule, TAKE 4 PRIOR TO DENTAL PROCEDURES   cholecalciferol (VITAMIN D3) 25 MCG (1000 UNIT) tablet, Take 1,000 Units by mouth at bedtime.   clonazePAM (KLONOPIN) 0.5 MG tablet, Take 1 tablet (0.5 mg total) by mouth  at bedtime as needed for anxiety.   gabapentin (NEURONTIN) 300 MG capsule, TAKE 1 TO 2 CAPSULES BY MOUTH AT BEDTIME   meclizine (ANTIVERT) 12.5 MG tablet, Take 12.5 mg by mouth 3 (three) times daily as needed for dizziness. Dr. Eduard Clos   Multiple Vitamin (MULTIVITAMIN) tablet, Take 1 tablet by mouth every morning.   omeprazole (PRILOSEC) 20 MG capsule, TAKE 1 CAPSULE BY MOUTH TWICE DAILY BEFORE A MEAL   Probiotic Product  (PROBIOTIC DAILY PO), Take 1 capsule by mouth daily.   zolpidem (AMBIEN) 5 MG tablet, TAKE 1 TABLET BY MOUTH AT BEDTIME *For reference purposes while preparing patient instructions.   Delete this med list prior to printing instructions for patient.*  On the morning of your procedure, take any morning medicines NOT listed above.  You may use sips of water.  5. Plan for one night stay--bring personal belongings. 6. Bring a current list of your medications and current insurance cards. 7. You MUST have a responsible person to drive you home. 8. Someone MUST be with you the first 24 hours after you arrive home or your discharge will be delayed. 9. Please wear clothes that are easy to get on and off and wear slip-on shoes.  Thank you for allowing [place] to care for you!   -- [place] [facility]    Follow-Up: At [facility], you and your health needs are our priority.  As part of our continuing mission to provide you with exceptional heart care, we have created designated Provider Care Teams.  These Care Teams include your primary Cardiologist (physician) and Advanced Practice Providers (APPs -  Physician Assistants and Nurse Practitioners) who all work together to provide you with the care you need, when you need it.  We recommend signing up for the patient portal called "MyChart".  Sign up information is provided on this After Visit Summary.  MyChart is used to connect with patients for Virtual Visits (Telemedicine).  Patients are able to view lab/test results, encounter notes, upcoming appointments, etc.  Non-urgent messages can be sent to your provider as well.   To learn more about what you can do with MyChart, go to [email or web address].    Your next appointment:    After Cath  Provider:   You may see Debbe Odea, MD or one of the following Advanced Practice Providers on your designated Care Team:   Nicolasa Ducking, NP Eula Listen, PA-C [place]  Fransico Michael, PA-C Charlsie Quest, NP

## 2023-01-27 NOTE — Progress Notes (Signed)
Cardiology Office Note:    Date:  01/27/2023   ID:  Katrina Poole August 15, 1946, MRN [id number]  PCP:  Reubin Milan, MD   [facility] Providers Cardiologist:  Debbe Odea, MD     Referring MD: Reubin Milan, MD   No chief complaint on file.   History of Present Illness:    Katrina Poole is a 77 y.o. female with a hx of hypertension, chronic lower back pain, lumbar spinal stenosis, degenerative disc disease s/p decompressive laminectomy 02/2022 who presents for follow-up.  Previously seen for left bundle branch block and preop evaluation.  Echocardiogram was obtained which showed severely reduced ejection fraction EF 25 to 30%.  She denies chest pain or shortness of breath.  Had a heart cath at [facility] in 2013 showing 50% LAD stenosis.  She states having a repeat left heart cath at [facility] 6 years ago with no CAD noted.  Could not find her current reports from [facility].   Past Medical History:  Diagnosis Date   Aortic atherosclerosis    Arthritis    osteo of left hip   Asthma    humidity and exercise during summer   GERD (gastroesophageal reflux disease)    Hypertension    controlled on meds   Lumbago of lumbar region with sciatica 06/12/2021   Meniere syndrome    hearing deaf right ear   Neuromuscular disorder    finger tips go numb, neck vertebrae   Restless leg syndrome    Spondylolisthesis, lumbosacral region    Vertigo    Wears dentures    full dentures on top, partial bottom    Past Surgical History:  Procedure Laterality Date   ABDOMINAL EXPOSURE N/A 10/09/2022   Procedure: ABDOMINAL EXPOSURE;  Surgeon: Cephus Shelling, MD;  Location: [facility] OR;  Service: Vascular;  Laterality: N/A;   ANTERIOR CERVICAL DECOMP/DISCECTOMY FUSION N/A 03/16/2018   Procedure: Anterior Cervical Discectomy Fusion - Cervical Three- Cervical Four, Cervical Four- Cervical Five, Cervical Five-Cervical Six, Cervical Six- Cerival Seven;  Surgeon: Donalee Citrin, MD;  Location: [facility]  OR;  Service: Neurosurgery;  Laterality: N/A;  Anterior Cervical Discectomy Fusion - Cervical Three- Cervical Four, Cervical Four- Cervical Five, Cervical Five-Cervical Six, Cervical Six- Ceriva   ANTERIOR LUMBAR FUSION N/A 10/09/2022   Procedure: Anteriorr Lumbar Interbody Fusion - Level Five-Sacral One  with posterior cortical screw augmentation and decompressive laminotomy on the right;  Surgeon: Donalee Citrin, MD;  Location: [facility] OR;  Service: Neurosurgery;  Laterality: N/A;   BUNIONECTOMY     Left x1, Right x2.   CARDIAC CATHETERIZATION     x2 both normal   CARPAL TUNNEL RELEASE Right    COLONOSCOPY     COLONOSCOPY WITH PROPOFOL N/A 06/04/2016   Procedure: COLONOSCOPY WITH PROPOFOL;  Surgeon: Christena Deem, MD;  Location: [facility] ENDOSCOPY;  Service: Endoscopy;  Laterality: N/A;   DILATION AND CURETTAGE OF UTERUS     ECTOPIC PREGNANCY SURGERY     ESOPHAGOGASTRODUODENOSCOPY (EGD) WITH PROPOFOL N/A 06/04/2016   Procedure: ESOPHAGOGASTRODUODENOSCOPY (EGD) WITH PROPOFOL;  Surgeon: Christena Deem, MD;  Location: [facility] ENDOSCOPY;  Service: Endoscopy;  Laterality: N/A;   exploratory laparoscopy     infertility surgery   HALLUX VALGUS CORRECTION Bilateral    JOINT REPLACEMENT Bilateral    partial knee   KNEE ARTHROPLASTY Left 01/27/2021   Procedure: CONVERSION OF LEFT PARTIAL KNEE TO LEFT TOTAL KNEE.;  Surgeon: Donato Heinz, MD;  Location: [facility] ORS;  Service: Orthopedics;  Laterality: Left;  KNEE ARTHROSCOPY Left 07/19/2015   Procedure: Arthroscopic limited synovectomy along with lateral compartment and retropatellar chondral debridement plus removal of several small loose bodies;  Surgeon: Erin Sons, MD;  Location: [facility];  Service: Orthopedics;  Laterality: Left;  1ST CASE PER CECE   LAMINECTOMY WITH POSTERIOR LATERAL ARTHRODESIS LEVEL 2 N/A 10/09/2022   Procedure: LAMINECTOMY WITH POSTERIOR LATERAL ARTHRODESIS LEVEL TWO;  Surgeon: Donalee Citrin, MD;  Location: [facility] OR;  Service:  Neurosurgery;  Laterality: N/A;   LUMBAR LAMINECTOMY/DECOMPRESSION MICRODISCECTOMY Right 04/29/2020   Procedure: Microdiscectomy - Lumbar Three-Lumbar Four - Lumbar Four-Lumbar Five - right;  Surgeon: Donalee Citrin, MD;  Location: [facility] OR;  Service: Neurosurgery;  Laterality: Right;  Microdiscectomy - Lumbar Three-Lumbar Four - Lumbar Four-Lumbar Five - right   MEDIAL PARTIAL KNEE REPLACEMENT Bilateral    POSTERIOR LUMBAR FUSION  02/2022   L3-4 and L4-5   SHOULDER SURGERY Right    TOTAL HIP ARTHROPLASTY Left 11/25/2015   Procedure: TOTAL HIP ARTHROPLASTY;  Surgeon: Donato Heinz, MD;  Location: [facility] ORS;  Service: Orthopedics;  Laterality: Left;   TRIGGER FINGER RELEASE Left     Current Medications: Current Meds  Medication Sig   acetaminophen (TYLENOL) 500 MG tablet Take 2 tablets (1,000 mg total) by mouth every 6 (six) hours as needed (DO NOT EXCEED 4000 MG DAILY FROM ALL SOURCES).   acetaminophen-codeine (TYLENOL #3) 300-30 MG tablet Take 1-2 tablets by mouth every 6 (six) hours as needed for moderate pain.   albuterol (VENTOLIN HFA) 108 (90 Base) MCG/ACT inhaler Inhale 2 puffs into the lungs every 6 (six) hours as needed for wheezing or shortness of breath.   alum hydroxide-mag trisilicate (GAVISCON) 80-20 MG CHEW chewable tablet Chew 1 tablet by mouth daily as needed for indigestion or heartburn.    azelastine (ASTELIN) 0.1 % nasal spray Place 2 sprays into both nostrils 2 (two) times daily. Use in each nostril as directed   carvedilol (COREG) 6.25 MG tablet Take 1 tablet (6.25 mg total) by mouth 2 (two) times daily.   cephALEXin (KEFLEX) 500 MG capsule TAKE 4 PRIOR TO DENTAL PROCEDURES   cholecalciferol (VITAMIN D3) 25 MCG (1000 UNIT) tablet Take 1,000 Units by mouth at bedtime.   clonazePAM (KLONOPIN) 0.5 MG tablet Take 1 tablet (0.5 mg total) by mouth at bedtime as needed for anxiety.   gabapentin (NEURONTIN) 300 MG capsule TAKE 1 TO 2 CAPSULES BY MOUTH AT BEDTIME   meclizine (ANTIVERT)  12.5 MG tablet Take 12.5 mg by mouth 3 (three) times daily as needed for dizziness. Dr. Eduard Clos   meloxicam (MOBIC) 7.5 MG tablet Take 1 tablet (7.5 mg total) by mouth 2 (two) times daily.   Multiple Vitamin (MULTIVITAMIN) tablet Take 1 tablet by mouth every morning.   omeprazole (PRILOSEC) 20 MG capsule TAKE 1 CAPSULE BY MOUTH TWICE DAILY BEFORE A MEAL   Probiotic Product (PROBIOTIC DAILY PO) Take 1 capsule by mouth daily.   promethazine-dextromethorphan (PROMETHAZINE-DM) 6.25-15 MG/5ML syrup Take 5 mLs by mouth 4 (four) times daily as needed for cough.   sacubitril-valsartan (ENTRESTO) 24-26 MG Take 1 tablet by mouth 2 (two) times daily.   traMADol (ULTRAM) 50 MG tablet Take 50 mg by mouth every 6 (six) hours as needed for moderate pain.   zolpidem (AMBIEN) 5 MG tablet TAKE 1 TABLET BY MOUTH AT BEDTIME     Allergies:   Azithromycin, Sulfa antibiotics, Oxycodone, Sulfacetamide, and Beef-derived products   Social History   Socioeconomic History   Marital status: Married  Spouse name: Stann Ore   Number of children: 1   Years of education: Not on file   Highest education level: Not on file  Occupational History   Occupation: Part time  Tobacco Use   Smoking status: Former    Packs/day: 1.50    Years: 30.00    Additional pack years: 0.00    Total pack years: 45.00    Types: Cigarettes    Quit date: 11/12/1990    Years since quitting: 32.2   Smokeless tobacco: Never  Vaping Use   Vaping Use: Never used  Substance and Sexual Activity   Alcohol use: Yes    Alcohol/week: 1.0 standard drink of alcohol    Types: 1 Standard drinks or equivalent per week    Comment: OCC [place], maybe 1 drink a week   Drug use: No   Sexual activity: Not Currently  Other Topics Concern   Not on file  Social History Narrative   Not on file   Social Determinants of Health   Financial Resource Strain: Low Risk  (10/16/2022)   Overall Financial Resource Strain (CARDIA)    Difficulty of  Paying Living Expenses: Not hard at all  Food Insecurity: No Food Insecurity (10/12/2022)   Hunger Vital Sign    Worried About Running Out of Food in the Last Year: Never true    Ran Out of Food in the Last Year: Never true  Transportation Needs: No Transportation Needs (10/12/2022)   PRAPARE - Administrator, Civil Service (Medical): No    Lack of Transportation (Non-Medical): No  Physical Activity: Insufficiently Active (10/16/2022)   Exercise Vital Sign    Days of Exercise per Week: 5 days    Minutes of Exercise per Session: 10 min  Stress: No Stress Concern Present (10/16/2022)   [employer] of Occupational Health - Occupational Stress Questionnaire    Feeling of Stress : Not at all  Social Connections: Moderately Integrated (10/16/2022)   Social Connection and Isolation Panel [NHANES]    Frequency of Communication with Friends and Family: More than three times a week    Frequency of Social Gatherings with Friends and Family: Once a week    Attends Religious Services: Never    Database administrator or Organizations: No    Attends Engineer, structural: More than 4 times per year    Marital Status: Married     Family History: The patient's family history includes COPD in her mother; Cancer in her father; Heart attack in her brother and brother; Heart attack (age of onset: 62) in her brother; Heart disease in her brother; Heart failure in her mother; Hypertension in her mother.  ROS:   Please see the history of present illness.     All other systems reviewed and are negative.  EKGs/Labs/Other Studies Reviewed:    The following studies were reviewed today:   EKG:  EKG is  ordered today.  The ekg ordered today demonstrates sinus rhythm, left bundle branch block.  Recent Labs: 09/29/2022: BUN 15; Creatinine, Ser 0.86; Hemoglobin 11.2; Platelets 320; Potassium 4.0; Sodium 136  Recent Lipid Panel    Component Value Date/Time   CHOL 211 (H) 10/23/2021 0944    TRIG 103 10/23/2021 0944   HDL 70 10/23/2021 0944   CHOLHDL 3.0 10/23/2021 0944   LDLCALC 123 (H) 10/23/2021 0944     Risk Assessment/Calculations:          Physical Exam:    VS:  BP (!) 165/97 (BP Location: Left Arm, Patient Position: Sitting, Cuff Size: Normal)   Pulse 73   Ht 4\' 11"  (1.499 m)   SpO2 97%   BMI 42.21 kg/m     Wt Readings from Last 3 Encounters:  11/18/22 209 lb (94.8 kg)  10/09/22 210 lb (95.3 kg)  10/07/22 210 lb 6.4 oz (95.4 kg)     GEN:  Well nourished, well developed in no acute distress HEENT: Normal NECK: No JVD; No carotid bruits CARDIAC: RRR, no murmurs, rubs, gallops RESPIRATORY:  Clear to auscultation without rales, wheezing or rhonchi  ABDOMEN: Soft, non-tender, non-distended MUSCULOSKELETAL:  No edema; back tenderness noted with weak movement of flanks.Marland Kitchen SKIN: Warm and dry NEUROLOGIC:  Alert and oriented x 3 PSYCHIATRIC:  Normal affect   ASSESSMENT:    1. Cardiomyopathy, unspecified type   2. Primary hypertension    PLAN:    In order of problems listed above:  Cardiomyopathy, EF 25 to 30%.  Start Coreg 6.25mg  twice daily, Entresto 24-26 mg twice daily.  Schedule left heart cath.  Refer to heartfailure clinic.  Titrate GDMT as BP permits. Hypertension, Coreg, Entresto 24-26 mg.  Follow-up in 1-2 months for medication titration.  Shared Decision Making/Informed Consent The risks [stroke (1 in 1000), death (1 in 1000), kidney failure [usually temporary] (1 in 500), bleeding (1 in 200), allergic reaction [possibly serious] (1 in 200)], benefits (diagnostic support and management of coronary artery disease) and alternatives of a cardiac catheterization were discussed in detail with Katrina Poole and she is willing to proceed.    Medication Adjustments/Labs and Tests Ordered: Current medicines are reviewed at length with the patient today.  Concerns regarding medicines are outlined above.  Orders Placed This Encounter  Procedures    Basic Metabolic Panel (BMET)   CBC   AMB referral to CHF clinic   EKG 12-Lead   No orders of the defined types were placed in this encounter.    Patient Instructions  Medication Instructions:   Your physician recommends that you continue on your current medications as directed. Please refer to the Current Medication list given to you today.  *If you need a refill on your cardiac medications before your next appointment, please call your pharmacy*   Lab Work:  Your physician recommends you go to the medical mall for lab work.   If you have labs (blood work) drawn today and your tests are completely normal, you will receive your results only by: MyChart Message (if you have MyChart) OR A paper copy in the mail If you have any lab test that is abnormal or we need to change your treatment, we will call you to review the results.   Testing/Procedures:      [place] A DEPT OF [facility] [place] [address] [place] [address] Dept: [phone] Loc: [phone]  Katrina Poole  01/27/2023  You are scheduled for a Cardiac Catheterization on Monday, May 6 with Dr. Lorine Bears.  1. Please arrive at the [facility] entrance, and check in at registration: [address] [place], [place], [place] [address] at 8:30 AM (This time is two hours before your procedure to ensure your preparation). Free valet parking service is available.   Special note: Every effort is made to have your procedure done on time. Please understand that emergencies sometimes delay scheduled procedures.  2. Diet: Do not eat solid foods after  midnight.  The patient may have clear liquids until 5am upon the day of the procedure.  3. Labs: You will need to have blood drawn on Today. You do not need to be fasting.  4. Medication instructions in preparation for your  procedure:     Current Outpatient Medications (Cardiovascular):    carvedilol (COREG) 6.25 MG tablet, Take 1 tablet (6.25 mg total) by mouth 2 (two) times daily.   sacubitril-valsartan (ENTRESTO) 24-26 MG, Take 1 tablet by mouth 2 (two) times daily.  Current Outpatient Medications (Respiratory):    albuterol (VENTOLIN HFA) 108 (90 Base) MCG/ACT inhaler, Inhale 2 puffs into the lungs every 6 (six) hours as needed for wheezing or shortness of breath.   azelastine (ASTELIN) 0.1 % nasal spray, Place 2 sprays into both nostrils 2 (two) times daily. Use in each nostril as directed   promethazine-dextromethorphan (PROMETHAZINE-DM) 6.25-15 MG/5ML syrup, Take 5 mLs by mouth 4 (four) times daily as needed for cough.  Current Outpatient Medications (Analgesics):    acetaminophen (TYLENOL) 500 MG tablet, Take 2 tablets (1,000 mg total) by mouth every 6 (six) hours as needed (DO NOT EXCEED 4000 MG DAILY FROM ALL SOURCES).   acetaminophen-codeine (TYLENOL #3) 300-30 MG tablet, Take 1-2 tablets by mouth every 6 (six) hours as needed for moderate pain.   meloxicam (MOBIC) 7.5 MG tablet, Take 1 tablet (7.5 mg total) by mouth 2 (two) times daily.   traMADol (ULTRAM) 50 MG tablet, Take 50 mg by mouth every 6 (six) hours as needed for moderate pain.   Current Outpatient Medications (Other):    alum hydroxide-mag trisilicate (GAVISCON) 80-20 MG CHEW chewable tablet, Chew 1 tablet by mouth daily as needed for indigestion or heartburn.    cephALEXin (KEFLEX) 500 MG capsule, TAKE 4 PRIOR TO DENTAL PROCEDURES   cholecalciferol (VITAMIN D3) 25 MCG (1000 UNIT) tablet, Take 1,000 Units by mouth at bedtime.   clonazePAM (KLONOPIN) 0.5 MG tablet, Take 1 tablet (0.5 mg total) by mouth at bedtime as needed for anxiety.   gabapentin (NEURONTIN) 300 MG capsule, TAKE 1 TO 2 CAPSULES BY MOUTH AT BEDTIME   meclizine (ANTIVERT) 12.5 MG tablet, Take 12.5 mg by mouth 3 (three) times daily as needed for dizziness. Dr. Eduard Clos    Multiple Vitamin (MULTIVITAMIN) tablet, Take 1 tablet by mouth every morning.   omeprazole (PRILOSEC) 20 MG capsule, TAKE 1 CAPSULE BY MOUTH TWICE DAILY BEFORE A MEAL   Probiotic Product (PROBIOTIC DAILY PO), Take 1 capsule by mouth daily.   zolpidem (AMBIEN) 5 MG tablet, TAKE 1 TABLET BY MOUTH AT BEDTIME *For reference purposes while preparing patient instructions.   Delete this med list prior to printing instructions for patient.*  On the morning of your procedure, take any morning medicines NOT listed above.  You may use sips of water.  5. Plan for one night stay--bring personal belongings. 6. Bring a current list of your medications and current insurance cards. 7. You MUST have a responsible person to drive you home. 8. Someone MUST be with you the first 24 hours after you arrive home or your discharge will be delayed. 9. Please wear clothes that are easy to get on and off and wear slip-on shoes.  Thank you for allowing [place] to care for you!   -- [facility]    Follow-Up: At [facility], you and your health needs are our priority.  As part of our continuing mission to provide you with exceptional heart care, we have  created designated Provider Care Teams.  These Care Teams include your primary Cardiologist (physician) and Advanced Practice Providers (APPs -  Physician Assistants and Nurse Practitioners) who all work together to provide you with the care you need, when you need it.  We recommend signing up for the patient portal called "MyChart".  Sign up information is provided on this After Visit Summary.  MyChart is used to connect with patients for Virtual Visits (Telemedicine).  Patients are able to view lab/test results, encounter notes, upcoming appointments, etc.  Non-urgent messages can be sent to your provider as well.   To learn more about what you can do with MyChart, go to [email or web address].    Your next appointment:    After  Cath  Provider:   You may see Debbe Odea, MD or one of the following Advanced Practice Providers on your designated Care Team:   Nicolasa Ducking, NP Eula Listen, PA-C Cadence Fransico Michael, PA-C Charlsie Quest, NP   Signed, Debbe Odea, MD  01/27/2023 1:21 PM    [facility]

## 2023-01-27 NOTE — H&P (View-Only) (Signed)
Cardiology Office Note:    Date:  01/27/2023   ID:  Katrina Poole, DOB 03/03/1946, MRN [id number]  PCP:  Berglund, Laura H, MD   [facility] Providers Cardiologist:  Jese Comella Agbor-Etang, MD     Referring MD: Berglund, Laura H, MD   No chief complaint on file.   History of Present Illness:    Katrina Poole is a 77 y.o. female with a hx of hypertension, chronic lower back pain, lumbar spinal stenosis, degenerative disc disease s/p decompressive laminectomy 02/2022 who presents for follow-up.  Previously seen for left bundle branch block and preop evaluation.  Echocardiogram was obtained which showed severely reduced ejection fraction EF 25 to 30%.  She denies chest pain or shortness of breath.  Had a heart cath at [facility] in 2013 showing 50% LAD stenosis.  She states having a repeat left heart cath at [facility] 6 years ago with no CAD noted.  Could not find her current reports from [facility].   Past Medical History:  Diagnosis Date   Aortic atherosclerosis    Arthritis    osteo of left hip   Asthma    humidity and exercise during summer   GERD (gastroesophageal reflux disease)    Hypertension    controlled on meds   Lumbago of lumbar region with sciatica 06/12/2021   Meniere syndrome    hearing deaf right ear   Neuromuscular disorder    finger tips go numb, neck vertebrae   Restless leg syndrome    Spondylolisthesis, lumbosacral region    Vertigo    Wears dentures    full dentures on top, partial bottom    Past Surgical History:  Procedure Laterality Date   ABDOMINAL EXPOSURE N/A 10/09/2022   Procedure: ABDOMINAL EXPOSURE;  Surgeon: Clark, Christopher J, MD;  Location: [facility];  Service: Vascular;  Laterality: N/A;   ANTERIOR CERVICAL DECOMP/DISCECTOMY FUSION N/A 03/16/2018   Procedure: Anterior Cervical Discectomy Fusion - Cervical Three- Cervical Four, Cervical Four- Cervical Five, Cervical Five-Cervical Six, Cervical Six- Cerival Seven;  Surgeon: Cram, Gary, MD;  Location: [facility]  OR;  Service: Neurosurgery;  Laterality: N/A;  Anterior Cervical Discectomy Fusion - Cervical Three- Cervical Four, Cervical Four- Cervical Five, Cervical Five-Cervical Six, Cervical Six- Ceriva   ANTERIOR LUMBAR FUSION N/A 10/09/2022   Procedure: Anteriorr Lumbar Interbody Fusion - Level Five-Sacral One  with posterior cortical screw augmentation and decompressive laminotomy on the right;  Surgeon: Cram, Gary, MD;  Location: [facility];  Service: Neurosurgery;  Laterality: N/A;   BUNIONECTOMY     Left x1, Right x2.   CARDIAC CATHETERIZATION     x2 both normal   CARPAL TUNNEL RELEASE Right    COLONOSCOPY     COLONOSCOPY WITH PROPOFOL N/A 06/04/2016   Procedure: COLONOSCOPY WITH PROPOFOL;  Surgeon: Martin U Skulskie, MD;  Location: [facility];  Service: Endoscopy;  Laterality: N/A;   DILATION AND CURETTAGE OF UTERUS     ECTOPIC PREGNANCY SURGERY     ESOPHAGOGASTRODUODENOSCOPY (EGD) WITH PROPOFOL N/A 06/04/2016   Procedure: ESOPHAGOGASTRODUODENOSCOPY (EGD) WITH PROPOFOL;  Surgeon: Martin U Skulskie, MD;  Location: [facility];  Service: Endoscopy;  Laterality: N/A;   exploratory laparoscopy     infertility surgery   HALLUX VALGUS CORRECTION Bilateral    JOINT REPLACEMENT Bilateral    partial knee   KNEE ARTHROPLASTY Left 01/27/2021   Procedure: CONVERSION OF LEFT PARTIAL KNEE TO LEFT TOTAL KNEE.;  Surgeon: Hooten, James P, MD;  Location: [facility] ORS;  Service: Orthopedics;  Laterality: Left;     KNEE ARTHROSCOPY Left 07/19/2015   Procedure: Arthroscopic limited synovectomy along with lateral compartment and retropatellar chondral debridement plus removal of several small loose bodies;  Surgeon: Harold Kernodle, MD;  Location: [facility];  Service: Orthopedics;  Laterality: Left;  1ST CASE PER CECE   LAMINECTOMY WITH POSTERIOR LATERAL ARTHRODESIS LEVEL 2 N/A 10/09/2022   Procedure: LAMINECTOMY WITH POSTERIOR LATERAL ARTHRODESIS LEVEL TWO;  Surgeon: Cram, Gary, MD;  [facility];  Service:  Neurosurgery;  Laterality: N/A;   LUMBAR LAMINECTOMY/DECOMPRESSION MICRODISCECTOMY Right 04/29/2020   Procedure: Microdiscectomy - Lumbar Three-Lumbar Four - Lumbar Four-Lumbar Five - right;  Surgeon: Cram, Gary, MD;  [facility]: [facility];  Service: Neurosurgery;  Laterality: Right;  Microdiscectomy - Lumbar Three-Lumbar Four - Lumbar Four-Lumbar Five - right   MEDIAL PARTIAL KNEE REPLACEMENT Bilateral    POSTERIOR LUMBAR FUSION  02/2022   L3-4 and L4-5   SHOULDER SURGERY Right    TOTAL HIP ARTHROPLASTY Left 11/25/2015   Procedure: TOTAL HIP ARTHROPLASTY;  Surgeon: James P Hooten, MD;  Location: [facility];  Service: Orthopedics;  Laterality: Left;   TRIGGER FINGER RELEASE Left     Current Medications: Current Meds  Medication Sig   acetaminophen (TYLENOL) 500 MG tablet Take 2 tablets (1,000 mg total) by mouth every 6 (six) hours as needed (DO NOT EXCEED 4000 MG DAILY FROM ALL SOURCES).   acetaminophen-codeine (TYLENOL #3) 300-30 MG tablet Take 1-2 tablets by mouth every 6 (six) hours as needed for moderate pain.   albuterol (VENTOLIN HFA) 108 (90 Base) MCG/ACT inhaler Inhale 2 puffs into the lungs every 6 (six) hours as needed for wheezing or shortness of breath.   alum hydroxide-mag trisilicate (GAVISCON) 80-20 MG CHEW chewable tablet Chew 1 tablet by mouth daily as needed for indigestion or heartburn.    azelastine (ASTELIN) 0.1 % nasal spray Place 2 sprays into both nostrils 2 (two) times daily. Use in each nostril as directed   carvedilol (COREG) 6.25 MG tablet Take 1 tablet (6.25 mg total) by mouth 2 (two) times daily.   cephALEXin (KEFLEX) 500 MG capsule TAKE 4 PRIOR TO DENTAL PROCEDURES   cholecalciferol (VITAMIN D3) 25 MCG (1000 UNIT) tablet Take 1,000 Units by mouth at bedtime.   clonazePAM (KLONOPIN) 0.5 MG tablet Take 1 tablet (0.5 mg total) by mouth at bedtime as needed for anxiety.   gabapentin (NEURONTIN) 300 MG capsule TAKE 1 TO 2 CAPSULES BY MOUTH AT BEDTIME   meclizine (ANTIVERT)  12.5 MG tablet Take 12.5 mg by mouth 3 (three) times daily as needed for dizziness. Dr. Shoh   meloxicam (MOBIC) 7.5 MG tablet Take 1 tablet (7.5 mg total) by mouth 2 (two) times daily.   Multiple Vitamin (MULTIVITAMIN) tablet Take 1 tablet by mouth every morning.   omeprazole (PRILOSEC) 20 MG capsule TAKE 1 CAPSULE BY MOUTH TWICE DAILY BEFORE A MEAL   Probiotic Product (PROBIOTIC DAILY PO) Take 1 capsule by mouth daily.   promethazine-dextromethorphan (PROMETHAZINE-DM) 6.25-15 MG/5ML syrup Take 5 mLs by mouth 4 (four) times daily as needed for cough.   sacubitril-valsartan (ENTRESTO) 24-26 MG Take 1 tablet by mouth 2 (two) times daily.   traMADol (ULTRAM) 50 MG tablet Take 50 mg by mouth every 6 (six) hours as needed for moderate pain.   zolpidem (AMBIEN) 5 MG tablet TAKE 1 TABLET BY MOUTH AT BEDTIME     Allergies:   Azithromycin, Sulfa antibiotics, Oxycodone, Sulfacetamide, and Beef-derived products   Social History   Socioeconomic History   Marital status: Married      Spouse name: Keith Carraway   Number of children: 1   Years of education: Not on file   Highest education level: Not on file  Occupational History   Occupation: Part time  Tobacco Use   Smoking status: Former    Packs/day: 1.50    Years: 30.00    Additional pack years: 0.00    Total pack years: 45.00    Types: Cigarettes    Quit date: 11/12/1990    Years since quitting: 32.2   Smokeless tobacco: Never  Vaping Use   Vaping Use: Never used  Substance and Sexual Activity   Alcohol use: Yes    Alcohol/week: 1.0 standard drink of alcohol    Types: 1 Standard drinks or equivalent per week    Comment: OCC [place] [facility] [place], maybe 1 drink a week   Drug use: No   Sexual activity: Not Currently  Other Topics Concern   Not on file  Social History Narrative   Not on file   Social Determinants of Health   Financial Resource Strain: Low Risk  (10/16/2022)   Overall Financial Resource Strain (CARDIA)    Difficulty of  Paying Living Expenses: Not hard at all  Food Insecurity: No Food Insecurity (10/12/2022)   Hunger Vital Sign    Worried About Running Out of Food in the Last Year: Never true    Ran Out of Food in the Last Year: Never true  Transportation Needs: No Transportation Needs (10/12/2022)   PRAPARE - Transportation    Lack of Transportation (Medical): No    Lack of Transportation (Non-Medical): No  Physical Activity: Insufficiently Active (10/16/2022)   Exercise Vital Sign    Days of Exercise per Week: 5 days    Minutes of Exercise per Session: 10 min  Stress: No Stress Concern Present (10/16/2022)   [employer] - Occupational Stress Questionnaire    Feeling of Stress : Not at all  Social Connections: Moderately Integrated (10/16/2022)   Social Connection and Isolation Panel [NHANES]    Frequency of Communication with Friends and Family: More than three times a week    Frequency of Social Gatherings with Friends and Family: Once a week    Attends Religious Services: Never    Active Member of Clubs or Organizations: No    Attends Club or Organization Meetings: More than 4 times per year    Marital Status: Married     Family History: The patient's family history includes COPD in her mother; Cancer in her father; Heart attack in her brother and brother; Heart attack (age of onset: 49) in her brother; Heart disease in her brother; Heart failure in her mother; Hypertension in her mother.  ROS:   Please see the history of present illness.     All other systems reviewed and are negative.  EKGs/Labs/Other Studies Reviewed:    The following studies were reviewed today:   EKG:  EKG is  ordered today.  The ekg ordered today demonstrates sinus rhythm, left bundle branch block.  Recent Labs: 09/29/2022: BUN 15; Creatinine, Ser 0.86; Hemoglobin 11.2; Platelets 320; Potassium 4.0; Sodium 136  Recent Lipid Panel    Component Value Date/Time   CHOL 211 (H) 10/23/2021 0944    TRIG 103 10/23/2021 0944   HDL 70 10/23/2021 0944   CHOLHDL 3.0 10/23/2021 0944   LDLCALC 123 (H) 10/23/2021 0944     Risk Assessment/Calculations:          Physical Exam:    VS:    BP (!) 165/97 (BP Location: Left Arm, Patient Position: Sitting, Cuff Size: Normal)   Pulse 73   Ht 4' 11" (1.499 m)   SpO2 97%   BMI 42.21 kg/m     Wt Readings from Last 3 Encounters:  11/18/22 209 lb (94.8 kg)  10/09/22 210 lb (95.3 kg)  10/07/22 210 lb 6.4 oz (95.4 kg)     GEN:  Well nourished, well developed in no acute distress HEENT: Normal NECK: No JVD; No carotid bruits CARDIAC: RRR, no murmurs, rubs, gallops RESPIRATORY:  Clear to auscultation without rales, wheezing or rhonchi  ABDOMEN: Soft, non-tender, non-distended MUSCULOSKELETAL:  No edema; back tenderness noted with weak movement of flanks.. SKIN: Warm and dry NEUROLOGIC:  Alert and oriented x 3 PSYCHIATRIC:  Normal affect   ASSESSMENT:    1. Cardiomyopathy, unspecified type   2. Primary hypertension    PLAN:    In order of problems listed above:  Cardiomyopathy, EF 25 to 30%.  Start Coreg 6.25mg twice daily, Entresto 24-26 mg twice daily.  Schedule left heart cath.  Refer to heartfailure clinic.  Titrate GDMT as BP permits. Hypertension, Coreg, Entresto 24-26 mg.  Follow-up in 1-2 months for medication titration.  Shared Decision Making/Informed Consent The risks [stroke (1 in 1000), death (1 in 1000), kidney failure [usually temporary] (1 in 500), bleeding (1 in 200), allergic reaction [possibly serious] (1 in 200)], benefits (diagnostic support and management of coronary artery disease) and alternatives of a cardiac catheterization were discussed in detail with Ms. Blowe and she is willing to proceed.    Medication Adjustments/Labs and Tests Ordered: Current medicines are reviewed at length with the patient today.  Concerns regarding medicines are outlined above.  Orders Placed This Encounter  Procedures    Basic Metabolic Panel (BMET)   CBC   AMB referral to CHF clinic   EKG 12-Lead   No orders of the defined types were placed in this encounter.    Patient Instructions  Medication Instructions:   Your physician recommends that you continue on your current medications as directed. Please refer to the Current Medication list given to you today.  *If you need a refill on your cardiac medications before your next appointment, please call your pharmacy*   Lab Work:  Your physician recommends you go to the medical mall for lab work.   If you have labs (blood work) drawn today and your tests are completely normal, you will receive your results only by: MyChart Message (if you have MyChart) OR A paper copy in the mail If you have any lab test that is abnormal or we need to change your treatment, we will call you to review the results.   Testing/Procedures:      [facility] A DEPT OF [facility] [place] [address], [address] [place] [address] Dept: [phone] Loc: [phone]  Jaiyah G Switalski  01/27/2023  You are scheduled for a Cardiac Catheterization on Monday, May 6 with Dr. Muhammad Arida.  1. Please arrive at the [facility] entrance, and check in at registration: [address], [place], [place] [address] at 8:30 AM (This time is two hours before your procedure to ensure your preparation). Free valet parking service is available.   Special note: Every effort is made to have your procedure done on time. Please understand that emergencies sometimes delay scheduled procedures.  2. Diet: Do not eat solid foods after   midnight.  The patient may have clear liquids until 5am upon the day of the procedure.  3. Labs: You will need to have blood drawn on Today. You do not need to be fasting.  4. Medication instructions in preparation for your  procedure:     Current Outpatient Medications (Cardiovascular):    carvedilol (COREG) 6.25 MG tablet, Take 1 tablet (6.25 mg total) by mouth 2 (two) times daily.   sacubitril-valsartan (ENTRESTO) 24-26 MG, Take 1 tablet by mouth 2 (two) times daily.  Current Outpatient Medications (Respiratory):    albuterol (VENTOLIN HFA) 108 (90 Base) MCG/ACT inhaler, Inhale 2 puffs into the lungs every 6 (six) hours as needed for wheezing or shortness of breath.   azelastine (ASTELIN) 0.1 % nasal spray, Place 2 sprays into both nostrils 2 (two) times daily. Use in each nostril as directed   promethazine-dextromethorphan (PROMETHAZINE-DM) 6.25-15 MG/5ML syrup, Take 5 mLs by mouth 4 (four) times daily as needed for cough.  Current Outpatient Medications (Analgesics):    acetaminophen (TYLENOL) 500 MG tablet, Take 2 tablets (1,000 mg total) by mouth every 6 (six) hours as needed (DO NOT EXCEED 4000 MG DAILY FROM ALL SOURCES).   acetaminophen-codeine (TYLENOL #3) 300-30 MG tablet, Take 1-2 tablets by mouth every 6 (six) hours as needed for moderate pain.   meloxicam (MOBIC) 7.5 MG tablet, Take 1 tablet (7.5 mg total) by mouth 2 (two) times daily.   traMADol (ULTRAM) 50 MG tablet, Take 50 mg by mouth every 6 (six) hours as needed for moderate pain.   Current Outpatient Medications (Other):    alum hydroxide-mag trisilicate (GAVISCON) 80-20 MG CHEW chewable tablet, Chew 1 tablet by mouth daily as needed for indigestion or heartburn.    cephALEXin (KEFLEX) 500 MG capsule, TAKE 4 PRIOR TO DENTAL PROCEDURES   cholecalciferol (VITAMIN D3) 25 MCG (1000 UNIT) tablet, Take 1,000 Units by mouth at bedtime.   clonazePAM (KLONOPIN) 0.5 MG tablet, Take 1 tablet (0.5 mg total) by mouth at bedtime as needed for anxiety.   gabapentin (NEURONTIN) 300 MG capsule, TAKE 1 TO 2 CAPSULES BY MOUTH AT BEDTIME   meclizine (ANTIVERT) 12.5 MG tablet, Take 12.5 mg by mouth 3 (three) times daily as needed for dizziness. Dr. Shoh    Multiple Vitamin (MULTIVITAMIN) tablet, Take 1 tablet by mouth every morning.   omeprazole (PRILOSEC) 20 MG capsule, TAKE 1 CAPSULE BY MOUTH TWICE DAILY BEFORE A MEAL   Probiotic Product (PROBIOTIC DAILY PO), Take 1 capsule by mouth daily.   zolpidem (AMBIEN) 5 MG tablet, TAKE 1 TABLET BY MOUTH AT BEDTIME *For reference purposes while preparing patient instructions.   Delete this med list prior to printing instructions for patient.*  On the morning of your procedure, take any morning medicines NOT listed above.  You may use sips of water.  5. Plan for one night stay--bring personal belongings. 6. Bring a current list of your medications and current insurance cards. 7. You MUST have a responsible person to drive you home. 8. Someone MUST be with you the first 24 hours after you arrive home or your discharge will be delayed. 9. Please wear clothes that are easy to get on and off and wear slip-on shoes.  Thank you for allowing us to care for you!   -- [facility]    Follow-Up: At [facility], you and your health needs are our priority.  As part of our continuing mission to provide you with exceptional heart care, we have   created designated Provider Care Teams.  These Care Teams include your primary Cardiologist (physician) and Advanced Practice Providers (APPs -  Physician Assistants and Nurse Practitioners) who all work together to provide you with the care you need, when you need it.  We recommend signing up for the patient portal called "MyChart".  Sign up information is provided on this After Visit Summary.  MyChart is used to connect with patients for Virtual Visits (Telemedicine).  Patients are able to view lab/test results, encounter notes, upcoming appointments, etc.  Non-urgent messages can be sent to your provider as well.   To learn more about what you can do with MyChart, go to [email or web address].    Your next appointment:    After  Cath  Provider:   You may see Maryama Kuriakose Agbor-Etang, MD or one of the following Advanced Practice Providers on your designated Care Team:   Christopher Berge, NP Ryan Dunn, PA-C Cadence Furth, PA-C Sheri Hammock, NP   Signed, Kadeen Sroka Agbor-Etang, MD  01/27/2023 1:21 PM    [facility] 

## 2023-02-02 DIAGNOSIS — M5459 Other low back pain: Secondary | ICD-10-CM | POA: Diagnosis not present

## 2023-02-02 DIAGNOSIS — M25551 Pain in right hip: Secondary | ICD-10-CM | POA: Diagnosis not present

## 2023-02-04 ENCOUNTER — Ambulatory Visit: Payer: Medicare Other | Admitting: Cardiology

## 2023-02-04 DIAGNOSIS — M5459 Other low back pain: Secondary | ICD-10-CM | POA: Diagnosis not present

## 2023-02-04 DIAGNOSIS — M25551 Pain in right hip: Secondary | ICD-10-CM | POA: Diagnosis not present

## 2023-02-08 ENCOUNTER — Encounter: Payer: Self-pay | Admitting: Cardiovascular Disease

## 2023-02-08 ENCOUNTER — Encounter
Admission: AD | Disposition: A | Discharge status: Home / Self Care | Payer: Self-pay | Source: Home / Self Care | Attending: Internal Medicine

## 2023-02-08 ENCOUNTER — Inpatient Hospital Stay
Admission: AD | Admit: 2023-02-08 | Discharge: 2023-02-09 | DRG: 286 | Disposition: A | Discharge status: Home / Self Care | Payer: Medicare Other | Attending: Internal Medicine | Admitting: Internal Medicine

## 2023-02-08 ENCOUNTER — Other Ambulatory Visit: Payer: Self-pay

## 2023-02-08 ENCOUNTER — Observation Stay: Payer: Medicare Other

## 2023-02-08 DIAGNOSIS — I5020 Unspecified systolic (congestive) heart failure: Secondary | ICD-10-CM | POA: Diagnosis present

## 2023-02-08 DIAGNOSIS — I110 Hypertensive heart disease with heart failure: Principal | ICD-10-CM | POA: Diagnosis present

## 2023-02-08 DIAGNOSIS — Z882 Allergy status to sulfonamides status: Secondary | ICD-10-CM | POA: Diagnosis not present

## 2023-02-08 DIAGNOSIS — Z791 Long term (current) use of non-steroidal anti-inflammatories (NSAID): Secondary | ICD-10-CM | POA: Diagnosis not present

## 2023-02-08 DIAGNOSIS — G2581 Restless legs syndrome: Secondary | ICD-10-CM | POA: Diagnosis present

## 2023-02-08 DIAGNOSIS — R0902 Hypoxemia: Secondary | ICD-10-CM | POA: Diagnosis present

## 2023-02-08 DIAGNOSIS — R0602 Shortness of breath: Secondary | ICD-10-CM | POA: Diagnosis not present

## 2023-02-08 DIAGNOSIS — Z79899 Other long term (current) drug therapy: Secondary | ICD-10-CM

## 2023-02-08 DIAGNOSIS — Z8249 Family history of ischemic heart disease and other diseases of the circulatory system: Secondary | ICD-10-CM

## 2023-02-08 DIAGNOSIS — Z885 Allergy status to narcotic agent status: Secondary | ICD-10-CM | POA: Diagnosis not present

## 2023-02-08 DIAGNOSIS — Z825 Family history of asthma and other chronic lower respiratory diseases: Secondary | ICD-10-CM

## 2023-02-08 DIAGNOSIS — Z91014 Allergy to mammalian meats: Secondary | ICD-10-CM | POA: Diagnosis not present

## 2023-02-08 DIAGNOSIS — I5022 Chronic systolic (congestive) heart failure: Secondary | ICD-10-CM | POA: Diagnosis present

## 2023-02-08 DIAGNOSIS — Z87891 Personal history of nicotine dependence: Secondary | ICD-10-CM

## 2023-02-08 DIAGNOSIS — M4317 Spondylolisthesis, lumbosacral region: Secondary | ICD-10-CM | POA: Diagnosis not present

## 2023-02-08 DIAGNOSIS — I429 Cardiomyopathy, unspecified: Secondary | ICD-10-CM

## 2023-02-08 DIAGNOSIS — M5416 Radiculopathy, lumbar region: Secondary | ICD-10-CM | POA: Diagnosis not present

## 2023-02-08 DIAGNOSIS — K219 Gastro-esophageal reflux disease without esophagitis: Secondary | ICD-10-CM | POA: Diagnosis present

## 2023-02-08 DIAGNOSIS — J810 Acute pulmonary edema: Secondary | ICD-10-CM

## 2023-02-08 DIAGNOSIS — R7303 Prediabetes: Secondary | ICD-10-CM | POA: Diagnosis not present

## 2023-02-08 DIAGNOSIS — H8101 Meniere's disease, right ear: Secondary | ICD-10-CM | POA: Diagnosis not present

## 2023-02-08 DIAGNOSIS — R339 Retention of urine, unspecified: Secondary | ICD-10-CM | POA: Diagnosis present

## 2023-02-08 DIAGNOSIS — Z96653 Presence of artificial knee joint, bilateral: Secondary | ICD-10-CM | POA: Diagnosis present

## 2023-02-08 DIAGNOSIS — I10 Essential (primary) hypertension: Secondary | ICD-10-CM | POA: Diagnosis not present

## 2023-02-08 DIAGNOSIS — I5023 Acute on chronic systolic (congestive) heart failure: Secondary | ICD-10-CM | POA: Diagnosis present

## 2023-02-08 DIAGNOSIS — I428 Other cardiomyopathies: Secondary | ICD-10-CM | POA: Diagnosis present

## 2023-02-08 DIAGNOSIS — I2510 Atherosclerotic heart disease of native coronary artery without angina pectoris: Secondary | ICD-10-CM

## 2023-02-08 DIAGNOSIS — Z96642 Presence of left artificial hip joint: Secondary | ICD-10-CM | POA: Diagnosis present

## 2023-02-08 DIAGNOSIS — M48061 Spinal stenosis, lumbar region without neurogenic claudication: Secondary | ICD-10-CM | POA: Diagnosis present

## 2023-02-08 DIAGNOSIS — R338 Other retention of urine: Secondary | ICD-10-CM | POA: Diagnosis present

## 2023-02-08 DIAGNOSIS — Z881 Allergy status to other antibiotic agents status: Secondary | ICD-10-CM

## 2023-02-08 DIAGNOSIS — R918 Other nonspecific abnormal finding of lung field: Secondary | ICD-10-CM | POA: Diagnosis not present

## 2023-02-08 HISTORY — DX: Atherosclerotic heart disease of native coronary artery without angina pectoris: I25.10

## 2023-02-08 HISTORY — DX: Acute pulmonary edema: J81.0

## 2023-02-08 HISTORY — DX: Other retention of urine: R33.8

## 2023-02-08 HISTORY — PX: LEFT HEART CATH AND CORONARY ANGIOGRAPHY: CATH118249

## 2023-02-08 LAB — LIPID PANEL
Cholesterol: 190 mg/dL (ref 0–200)
HDL: 66 mg/dL (ref >40)
LDL Cholesterol: 105 mg/dL — ABNORMAL HIGH (ref 0–99)
Total CHOL/HDL Ratio: 2.9 RATIO
Triglycerides: 93 mg/dL (ref <150)
VLDL: 19 mg/dL (ref 0–40)

## 2023-02-08 LAB — CBC
HCT: 35.5 % — ABNORMAL LOW (ref 36.0–46.0)
Hemoglobin: 11.2 g/dL — ABNORMAL LOW (ref 12.0–15.0)
MCH: 27.2 pg (ref 26.0–34.0)
MCHC: 31.5 g/dL (ref 30.0–36.0)
MCV: 86.2 fL (ref 80.0–100.0)
Platelets: 292 10*3/uL (ref 150–400)
RBC: 4.12 MIL/uL (ref 3.87–5.11)
RDW: 16 % — ABNORMAL HIGH (ref 11.5–15.5)
WBC: 8.4 10*3/uL (ref 4.0–10.5)
nRBC: 0 % (ref 0.0–0.2)

## 2023-02-08 LAB — COMPREHENSIVE METABOLIC PANEL
ALT: 21 U/L (ref 0–44)
AST: 20 U/L (ref 15–41)
Albumin: 3.9 g/dL (ref 3.5–5.0)
Alkaline Phosphatase: 100 U/L (ref 38–126)
Anion gap: 11 (ref 5–15)
BUN: 19 mg/dL (ref 8–23)
CO2: 25 mmol/L (ref 22–32)
Calcium: 9 mg/dL (ref 8.9–10.3)
Chloride: 104 mmol/L (ref 98–111)
Creatinine, Ser: 0.73 mg/dL (ref 0.44–1.00)
GFR, Estimated: >60 mL/min (ref >60)
Glucose, Bld: 107 mg/dL — ABNORMAL HIGH (ref 70–99)
Potassium: 3.4 mmol/L — ABNORMAL LOW (ref 3.5–5.1)
Sodium: 140 mmol/L (ref 135–145)
Total Bilirubin: 0.8 mg/dL (ref 0.3–1.2)
Total Protein: 7.1 g/dL (ref 6.5–8.1)

## 2023-02-08 LAB — MAGNESIUM: Magnesium: 1.7 mg/dL (ref 1.7–2.4)

## 2023-02-08 LAB — MRSA NEXT GEN BY PCR, NASAL: MRSA by PCR Next Gen: NOT DETECTED

## 2023-02-08 LAB — HEMOGLOBIN A1C
Hgb A1c MFr Bld: 5.4 % (ref 4.8–5.6)
Mean Plasma Glucose: 108.28 mg/dL

## 2023-02-08 LAB — BRAIN NATRIURETIC PEPTIDE: B Natriuretic Peptide: 727.1 pg/mL — ABNORMAL HIGH (ref 0.0–100.0)

## 2023-02-08 LAB — GLUCOSE, CAPILLARY: Glucose-Capillary: 120 mg/dL — ABNORMAL HIGH (ref 70–99)

## 2023-02-08 LAB — TSH: TSH: 1.615 u[IU]/mL (ref 0.350–4.500)

## 2023-02-08 LAB — TROPONIN I (HIGH SENSITIVITY): Troponin I (High Sensitivity): 32 ng/L — ABNORMAL HIGH (ref <18)

## 2023-02-08 SURGERY — LEFT HEART CATH AND CORONARY ANGIOGRAPHY
Anesthesia: Moderate Sedation | Laterality: Left

## 2023-02-08 MED ORDER — PANTOPRAZOLE SODIUM 40 MG PO TBEC
40.0000 mg | DELAYED_RELEASE_TABLET | Freq: Two times a day (BID) | ORAL | Status: DC
  Administered 2023-02-09: 40 mg via ORAL
  Filled 2023-02-08: qty 1

## 2023-02-08 MED ORDER — ACETAMINOPHEN 325 MG PO TABS
650.0000 mg | ORAL_TABLET | ORAL | Status: DC | PRN
  Administered 2023-02-08 – 2023-02-09 (×2): 650 mg via ORAL
  Filled 2023-02-08 (×2): qty 2

## 2023-02-08 MED ORDER — VERAPAMIL HCL 2.5 MG/ML IV SOLN
INTRAVENOUS | Status: DC | PRN
  Administered 2023-02-08: 2.5 mg via INTRAVENOUS

## 2023-02-08 MED ORDER — FUROSEMIDE 20 MG PO TABS: 20.0000 mg | ORAL_TABLET | Freq: Every day | ORAL | 3 refills | Status: DC

## 2023-02-08 MED ORDER — SACUBITRIL-VALSARTAN 24-26 MG PO TABS
1.0000 | ORAL_TABLET | Freq: Two times a day (BID) | ORAL | Status: DC
  Administered 2023-02-08 – 2023-02-09 (×2): 1 via ORAL
  Filled 2023-02-08 (×2): qty 1

## 2023-02-08 MED ORDER — NITROGLYCERIN IN D5W 200-5 MCG/ML-% IV SOLN
INTRAVENOUS | Status: AC
  Administered 2023-02-08: 20 ug/min via INTRAVENOUS
  Filled 2023-02-08: qty 250

## 2023-02-08 MED ORDER — SODIUM CHLORIDE 0.9 % IV SOLN: 250.0000 mL | INTRAVENOUS | Status: DC | PRN

## 2023-02-08 MED ORDER — ASPIRIN 81 MG PO CHEW
81.0000 mg | CHEWABLE_TABLET | Freq: Once | ORAL | Status: AC
  Administered 2023-02-08: 81 mg via ORAL

## 2023-02-08 MED ORDER — MIDAZOLAM HCL 2 MG/2ML IJ SOLN
INTRAMUSCULAR | Status: AC
  Filled 2023-02-08: qty 2

## 2023-02-08 MED ORDER — NITROGLYCERIN IN D5W 200-5 MCG/ML-% IV SOLN: 0.0000 ug/min | INTRAVENOUS | Status: DC

## 2023-02-08 MED ORDER — POTASSIUM CHLORIDE CRYS ER 10 MEQ PO TBCR: 10.0000 meq | EXTENDED_RELEASE_TABLET | Freq: Every day | ORAL | 3 refills | Status: DC

## 2023-02-08 MED ORDER — HEPARIN (PORCINE) IN NACL 2000-0.9 UNIT/L-% IV SOLN
INTRAVENOUS | Status: DC | PRN
  Administered 2023-02-08: 1000 mL

## 2023-02-08 MED ORDER — ORAL CARE MOUTH RINSE: 15.0000 mL | OROMUCOSAL | Status: DC | PRN

## 2023-02-08 MED ORDER — FENTANYL CITRATE (PF) 100 MCG/2ML IJ SOLN
INTRAMUSCULAR | Status: DC | PRN
  Administered 2023-02-08: 25 ug via INTRAVENOUS

## 2023-02-08 MED ORDER — FUROSEMIDE 10 MG/ML IJ SOLN: 40.0000 mg | Freq: Once | INTRAMUSCULAR | Status: AC

## 2023-02-08 MED ORDER — SODIUM CHLORIDE 0.9% FLUSH
3.0000 mL | Freq: Two times a day (BID) | INTRAVENOUS | Status: DC
  Administered 2023-02-08: 3 mL via INTRAVENOUS

## 2023-02-08 MED ORDER — LIDOCAINE HCL (PF) 1 % IJ SOLN
INTRAMUSCULAR | Status: DC | PRN
  Administered 2023-02-08: 5 mL

## 2023-02-08 MED ORDER — HEPARIN SODIUM (PORCINE) 1000 UNIT/ML IJ SOLN
INTRAMUSCULAR | Status: AC
  Filled 2023-02-08: qty 10

## 2023-02-08 MED ORDER — GABAPENTIN 300 MG PO CAPS
600.0000 mg | ORAL_CAPSULE | Freq: Every day | ORAL | Status: DC
  Administered 2023-02-08: 600 mg via ORAL
  Filled 2023-02-08: qty 2

## 2023-02-08 MED ORDER — MAGNESIUM SULFATE IN D5W 1-5 GM/100ML-% IV SOLN
1.0000 g | Freq: Once | INTRAVENOUS | Status: AC
  Administered 2023-02-08: 1 g via INTRAVENOUS
  Filled 2023-02-08: qty 100

## 2023-02-08 MED ORDER — VERAPAMIL HCL 2.5 MG/ML IV SOLN
INTRAVENOUS | Status: AC
  Filled 2023-02-08: qty 2

## 2023-02-08 MED ORDER — SODIUM CHLORIDE 0.9% FLUSH: 3.0000 mL | INTRAVENOUS | Status: DC | PRN

## 2023-02-08 MED ORDER — TRAMADOL HCL 50 MG PO TABS
50.0000 mg | ORAL_TABLET | Freq: Four times a day (QID) | ORAL | Status: DC | PRN
  Administered 2023-02-08 – 2023-02-09 (×3): 50 mg via ORAL
  Filled 2023-02-08 (×3): qty 1

## 2023-02-08 MED ORDER — ZOLPIDEM TARTRATE 5 MG PO TABS
5.0000 mg | ORAL_TABLET | Freq: Every day | ORAL | Status: DC
  Administered 2023-02-08: 5 mg via ORAL
  Filled 2023-02-08: qty 1

## 2023-02-08 MED ORDER — ONDANSETRON HCL 4 MG/2ML IJ SOLN: 4.0000 mg | Freq: Four times a day (QID) | INTRAMUSCULAR | Status: DC | PRN

## 2023-02-08 MED ORDER — SACUBITRIL-VALSARTAN 24-26 MG PO TABS: 1.0000 | ORAL_TABLET | Freq: Two times a day (BID) | ORAL | Status: DC

## 2023-02-08 MED ORDER — FUROSEMIDE 10 MG/ML IJ SOLN
40.0000 mg | Freq: Once | INTRAMUSCULAR | Status: AC
  Administered 2023-02-08: 40 mg via INTRAVENOUS
  Filled 2023-02-08: qty 4

## 2023-02-08 MED ORDER — ASPIRIN 81 MG PO CHEW
CHEWABLE_TABLET | ORAL | Status: AC
  Filled 2023-02-08: qty 1

## 2023-02-08 MED ORDER — SODIUM CHLORIDE 0.9% FLUSH
3.0000 mL | Freq: Two times a day (BID) | INTRAVENOUS | Status: DC
  Administered 2023-02-08 – 2023-02-09 (×3): 3 mL via INTRAVENOUS

## 2023-02-08 MED ORDER — CARVEDILOL 6.25 MG PO TABS
6.2500 mg | ORAL_TABLET | Freq: Two times a day (BID) | ORAL | Status: DC
  Administered 2023-02-08 – 2023-02-09 (×2): 6.25 mg via ORAL
  Filled 2023-02-08 (×2): qty 1

## 2023-02-08 MED ORDER — SODIUM CHLORIDE 0.9 % IV SOLN: INTRAVENOUS | Status: DC

## 2023-02-08 MED ORDER — LIDOCAINE HCL 1 % IJ SOLN
INTRAMUSCULAR | Status: AC
  Filled 2023-02-08: qty 20

## 2023-02-08 MED ORDER — FUROSEMIDE 10 MG/ML IJ SOLN
INTRAMUSCULAR | Status: AC
  Administered 2023-02-08: 40 mg via INTRAVENOUS
  Filled 2023-02-08: qty 4

## 2023-02-08 MED ORDER — IOHEXOL 300 MG/ML  SOLN
INTRAMUSCULAR | Status: DC | PRN
  Administered 2023-02-08: 47 mL

## 2023-02-08 MED ORDER — POTASSIUM CHLORIDE CRYS ER 20 MEQ PO TBCR
40.0000 meq | EXTENDED_RELEASE_TABLET | Freq: Once | ORAL | Status: AC
  Administered 2023-02-08: 40 meq via ORAL
  Filled 2023-02-08: qty 2

## 2023-02-08 MED ORDER — ALBUTEROL SULFATE (2.5 MG/3ML) 0.083% IN NEBU: 2.5000 mg | INHALATION_SOLUTION | Freq: Four times a day (QID) | RESPIRATORY_TRACT | Status: DC | PRN

## 2023-02-08 MED ORDER — FENTANYL CITRATE (PF) 100 MCG/2ML IJ SOLN
INTRAMUSCULAR | Status: AC
  Filled 2023-02-08: qty 2

## 2023-02-08 MED ORDER — HEPARIN SODIUM (PORCINE) 1000 UNIT/ML IJ SOLN
INTRAMUSCULAR | Status: DC | PRN
  Administered 2023-02-08: 4000 [IU] via INTRAVENOUS

## 2023-02-08 MED ORDER — POTASSIUM CHLORIDE 10 MEQ/100ML IV SOLN: 10.0000 meq | INTRAVENOUS | Status: DC

## 2023-02-08 MED ORDER — ROSUVASTATIN CALCIUM 10 MG PO TABS
10.0000 mg | ORAL_TABLET | Freq: Every day | ORAL | Status: DC
  Administered 2023-02-08 – 2023-02-09 (×2): 10 mg via ORAL
  Filled 2023-02-08 (×2): qty 1

## 2023-02-08 MED ORDER — ORAL CARE MOUTH RINSE
15.0000 mL | OROMUCOSAL | Status: DC
  Administered 2023-02-08 – 2023-02-09 (×3): 15 mL via OROMUCOSAL

## 2023-02-08 MED ORDER — MIDAZOLAM HCL 2 MG/2ML IJ SOLN
INTRAMUSCULAR | Status: DC | PRN
  Administered 2023-02-08: 1 mg via INTRAVENOUS

## 2023-02-08 MED ORDER — SPIRONOLACTONE 12.5 MG HALF TABLET
12.5000 mg | ORAL_TABLET | Freq: Every day | ORAL | Status: DC
  Administered 2023-02-08: 12.5 mg via ORAL
  Filled 2023-02-08 (×2): qty 1

## 2023-02-08 MED ORDER — CLONAZEPAM 0.5 MG PO TABS: 0.5000 mg | ORAL_TABLET | Freq: Every evening | ORAL | Status: DC | PRN

## 2023-02-08 SURGICAL SUPPLY — 10 items
CATH INFINITI 5FR JK (CATHETERS) IMPLANT
DEVICE RAD TR BAND REGULAR (VASCULAR PRODUCTS) IMPLANT
DRAPE BRACHIAL (DRAPES) IMPLANT
GLIDESHEATH SLEND SS 6F .021 (SHEATH) IMPLANT
GUIDEWIRE INQWIRE 1.5J.035X260 (WIRE) IMPLANT
INQWIRE 1.5J .035X260CM (WIRE) ×1
PACK CARDIAC CATH (CUSTOM PROCEDURE TRAY) ×1 IMPLANT
PROTECTION STATION PRESSURIZED (MISCELLANEOUS) ×1
SET ATX-X65L (MISCELLANEOUS) IMPLANT
STATION PROTECTION PRESSURIZED (MISCELLANEOUS) IMPLANT

## 2023-02-08 NOTE — Assessment & Plan Note (Addendum)
Diet controlled only at this time.  Last A1c within normal limits at 5.5%.  - A1c pending

## 2023-02-08 NOTE — Assessment & Plan Note (Signed)
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  Patient is not currently on a statin. --Started on Crestor 10 mg daily

## 2023-02-08 NOTE — Assessment & Plan Note (Addendum)
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  Left heart cath with nonobstructive CAD. - Cardiology consulted; appreciate their recommendations - Continue Entresto 24/26 bid.  - Continue Coreg 6.25 mg bid.  - Increase spironolactone to 25 mg daily  - Add Farxiga 10 mg daily. - Start Lasix 20 mg po daily for home (was not on Lasix prior to admission) and KCl 10 daily.   - cardiac MRI as outpatient to assess for infiltrative disease, myocarditis.

## 2023-02-08 NOTE — Consult Note (Signed)
Advanced Heart Failure Team Consult Note   Primary Physician: Reubin Milan, MD PCP-Cardiologist:  Debbe Odea, MD  Reason for Consultation: CHF  HPI:    Katrina Poole is seen today for evaluation of CHF at the request of Dr. Kirke Corin.   77 y.o. with history of HTN and osteoarthritis was recently found to have a cardiomyopathy. She was admitted today after cath for management of CHF.  Patient reports 2 prior cardiac caths, one at [facility] and one at [facility].  Neither showed obstructive CAD.  She does have a strong family history of CAD (multiple siblings with MIs/CAD).  She never had echoes with prior workups per her report.  Patient was found recently to have a LBBB, so echo was ordered by Dr. Azucena Cecil.  Echo was done in 4/24 and showed EF 25-30%, moderate LV dilation, normal RV, and moderate MR.  Due to low EF, she was set up for LHC that was done today.  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  Patient was short of breath and was put on Bipap.  She was given Lasix 40 mg IV x 1 with excellent response. BP was elevated.  She has been on Coreg and Entresto at home but took neither of them today.   At baseline, she reports shortness of breath walking up hills or with other forms of moderate exertion.  She has attributed this to asthma in the past.  This has been going on for years.  No chest pain. No orthopnea/PND.  No palpitations or lightheadedness.   Review of Systems: All systems reviewed and negative except as per HPI.   Home Medications Prior to Admission medications   Medication Sig Start Date End Date Taking? Authorizing Provider  acetaminophen (TYLENOL) 500 MG tablet Take 2 tablets (1,000 mg total) by mouth every 6 (six) hours as needed (DO NOT EXCEED 4000 MG DAILY FROM ALL SOURCES). 03/30/21  Yes Reubin Milan, MD  acetaminophen-codeine (TYLENOL #3) 300-30 MG tablet Take 1-2 tablets by mouth every 6 (six) hours as needed for  moderate pain. 10/10/22  Yes Bedelia Person, MD  albuterol (VENTOLIN HFA) 108 (90 Base) MCG/ACT inhaler Inhale 2 puffs into the lungs every 6 (six) hours as needed for wheezing or shortness of breath. 03/09/22  Yes Reubin Milan, MD  alum hydroxide-mag trisilicate (GAVISCON) 80-20 MG CHEW chewable tablet Chew 1 tablet by mouth daily as needed for indigestion or heartburn.    Yes [provider]  azelastine (ASTELIN) 0.1 % nasal spray Place 2 sprays into both nostrils 2 (two) times daily. Use in each nostril as directed 01/14/23  Yes Jerrol Banana, MD  carvedilol (COREG) 6.25 MG tablet Take 1 tablet (6.25 mg total) by mouth 2 (two) times daily. 01/25/23  Yes Agbor-Etang, Arlys John, MD  cephALEXin (KEFLEX) 500 MG capsule TAKE 4 PRIOR TO DENTAL PROCEDURES 01/13/23  Yes Reubin Milan, MD  cholecalciferol (VITAMIN D3) 25 MCG (1000 UNIT) tablet Take 1,000 Units by mouth at bedtime.   Yes [provider]  clonazePAM (KLONOPIN) 0.5 MG tablet Take 1 tablet (0.5 mg total) by mouth at bedtime as needed for anxiety. 09/14/22  Yes Reubin Milan, MD  furosemide (LASIX) 20 MG tablet Take 1 tablet (20 mg total) by mouth daily. 02/08/23 02/08/24 Yes Iran Ouch, MD  gabapentin (NEURONTIN) 300 MG capsule TAKE 1 TO 2 CAPSULES BY MOUTH AT BEDTIME 10/12/22  Yes Reubin Milan, MD  meclizine (ANTIVERT) 12.5 MG tablet Take 12.5 mg by mouth 3 (three) times daily as needed for dizziness. Dr. Eduard Clos   Yes [provider]  meloxicam (MOBIC) 7.5 MG tablet Take 1 tablet (7.5 mg total) by mouth 2 (two) times daily. 12/07/22  Yes Reubin Milan, MD  Multiple Vitamin (MULTIVITAMIN) tablet Take 1 tablet by mouth every morning.   Yes [provider]  omeprazole (PRILOSEC) 20 MG capsule TAKE 1 CAPSULE BY MOUTH TWICE DAILY BEFORE A MEAL 01/10/23  Yes Reubin Milan, MD  potassium chloride (KLOR-CON M) 10 MEQ tablet Take 1 tablet (10 mEq total) by mouth daily. 02/08/23  Yes Iran Ouch,  MD  Probiotic Product (PROBIOTIC DAILY PO) Take 1 capsule by mouth daily.   Yes [provider]  promethazine-dextromethorphan (PROMETHAZINE-DM) 6.25-15 MG/5ML syrup Take 5 mLs by mouth 4 (four) times daily as needed for cough. 01/14/23  Yes Jerrol Banana, MD  sacubitril-valsartan (ENTRESTO) 24-26 MG Take 1 tablet by mouth 2 (two) times daily. 01/25/23  Yes Agbor-Etang, Arlys John, MD  traMADol (ULTRAM) 50 MG tablet Take 50 mg by mouth every 6 (six) hours as needed for moderate pain. 09/04/22  Yes [provider]  zolpidem (AMBIEN) 5 MG tablet TAKE 1 TABLET BY MOUTH AT BEDTIME 01/24/23  Yes Reubin Milan, MD    Past Medical History: Past Medical History:  Diagnosis Date   Aortic atherosclerosis (HCC)    Arthritis    osteo of left hip   Asthma    humidity and exercise during summer   GERD (gastroesophageal reflux disease)    Hypertension    controlled on meds   Lumbago of lumbar region with sciatica 06/12/2021   Meniere syndrome    hearing deaf right ear   Neuromuscular disorder (HCC)    finger tips go numb, neck vertebrae   Restless leg syndrome    Spondylolisthesis, lumbosacral region    Vertigo    Wears dentures    full dentures on top, partial bottom    Past Surgical History: Past Surgical History:  Procedure Laterality Date   ABDOMINAL EXPOSURE N/A 10/09/2022   Procedure: ABDOMINAL EXPOSURE;  Surgeon: Cephus Shelling, MD;  Location: [facility] OR;  Service: Vascular;  Laterality: N/A;   ANTERIOR CERVICAL DECOMP/DISCECTOMY FUSION N/A 03/16/2018   Procedure: Anterior Cervical Discectomy Fusion - Cervical Three- Cervical Four, Cervical Four- Cervical Five, Cervical Five-Cervical Six, Cervical Six- Cerival Seven;  Surgeon: Donalee Citrin, MD;  Location: [facility] OR;  Service: Neurosurgery;  Laterality: N/A;  Anterior Cervical Discectomy Fusion - Cervical Three- Cervical Four, Cervical Four- Cervical Five, Cervical Five-Cervical Six, Cervical Six- Ceriva   ANTERIOR LUMBAR  FUSION N/A 10/09/2022   Procedure: Anteriorr Lumbar Interbody Fusion - Level Five-Sacral One  with posterior cortical screw augmentation and decompressive laminotomy on the right;  Surgeon: Donalee Citrin, MD;  Location: [facility] OR;  Service: Neurosurgery;  Laterality: N/A;   BUNIONECTOMY     Left x1, Right x2.   CARDIAC CATHETERIZATION     x2 both normal   CARPAL TUNNEL RELEASE Right    COLONOSCOPY     COLONOSCOPY WITH PROPOFOL N/A 06/04/2016   Procedure: COLONOSCOPY WITH PROPOFOL;  Surgeon: Christena Deem, MD;  Location[facility] ENDOSCOPY;  Service: Endoscopy;  Laterality: N/A;   DILATION AND CURETTAGE OF UTERUS     ECTOPIC PREGNANCY SURGERY     ESOPHAGOGASTRODUODENOSCOPY (EGD) WITH PROPOFOL N/A 06/04/2016   Procedure: ESOPHAGOGASTRODUODENOSCOPY (EGD) WITH PROPOFOL;  Surgeon: Christena Deem, MD;  Location: [facility]  ENDOSCOPY;  Service: Endoscopy;  Laterality: N/A;   exploratory laparoscopy     infertility surgery   HALLUX VALGUS CORRECTION Bilateral    JOINT REPLACEMENT Bilateral    partial knee   KNEE ARTHROPLASTY Left 01/27/2021   Procedure: CONVERSION OF LEFT PARTIAL KNEE TO LEFT TOTAL KNEE.;  Surgeon: Donato Heinz, MD;  Location: [facility] ORS;  Service: Orthopedics;  Laterality: Left;   KNEE ARTHROSCOPY Left 07/19/2015   Procedure: Arthroscopic limited synovectomy along with lateral compartment and retropatellar chondral debridement plus removal of several small loose bodies;  Surgeon: Erin Sons, MD;  Location: [facility];  Service: Orthopedics;  Laterality: Left;  1ST CASE PER CECE   LAMINECTOMY WITH POSTERIOR LATERAL ARTHRODESIS LEVEL 2 N/A 10/09/2022   Procedure: LAMINECTOMY WITH POSTERIOR LATERAL ARTHRODESIS LEVEL TWO;  Surgeon: Donalee Citrin, MD;  Location: [facility] OR;  Service: Neurosurgery;  Laterality: N/A;   LUMBAR LAMINECTOMY/DECOMPRESSION MICRODISCECTOMY Right 04/29/2020   Procedure: Microdiscectomy - Lumbar Three-Lumbar Four - Lumbar Four-Lumbar Five - right;  Surgeon: Donalee Citrin,  MD;  Location: [facility] OR;  Service: Neurosurgery;  Laterality: Right;  Microdiscectomy - Lumbar Three-Lumbar Four - Lumbar Four-Lumbar Five - right   MEDIAL PARTIAL KNEE REPLACEMENT Bilateral    POSTERIOR LUMBAR FUSION  02/2022   L3-4 and L4-5   SHOULDER SURGERY Right    TOTAL HIP ARTHROPLASTY Left 11/25/2015   Procedure: TOTAL HIP ARTHROPLASTY;  Surgeon: Donato Heinz, MD;  Location: [facility] ORS;  Service: Orthopedics;  Laterality: Left;   TRIGGER FINGER RELEASE Left     Family History: Family History  Problem Relation Age of Onset   Heart failure Mother    COPD Mother    Hypertension Mother    Cancer Father    Heart attack Brother    Heart attack Brother 80   Heart attack Brother    Heart disease Brother     Social History: Social History   Socioeconomic History   Marital status: Married    Spouse name: Stann Ore   Number of children: 1   Years of education: Not on file   Highest education level: Not on file  Occupational History   Occupation: Part time  Tobacco Use   Smoking status: Former    Packs/day: 1.50    Years: 30.00    Additional pack years: 0.00    Total pack years: 45.00    Types: Cigarettes    Quit date: 11/12/1990    Years since quitting: 32.2   Smokeless tobacco: Never  Vaping Use   Vaping Use: Never used  Substance and Sexual Activity   Alcohol use: Yes    Alcohol/week: 1.0 standard drink of alcohol    Types: 1 Standard drinks or equivalent per week    Comment: OCC [place], maybe 1 drink a week   Drug use: No   Sexual activity: Not Currently  Other Topics Concern   Not on file  Social History Narrative   Not on file   Social Determinants of Health   Financial Resource Strain: Low Risk  (10/16/2022)   Overall Financial Resource Strain (CARDIA)    Difficulty of Paying Living Expenses: Not hard at all  Food Insecurity: No Food Insecurity (10/12/2022)   Hunger Vital Sign    Worried About Running Out of Food in the Last Year: Never true     Ran Out of Food in the Last Year: Never true  Transportation Needs: No Transportation Needs (10/12/2022)   PRAPARE - Transportation  Lack of Transportation (Medical): No    Lack of Transportation (Non-Medical): No  Physical Activity: Insufficiently Active (10/16/2022)   Exercise Vital Sign    Days of Exercise per Week: 5 days    Minutes of Exercise per Session: 10 min  Stress: No Stress Concern Present (10/16/2022)   [employer] of Occupational Health - Occupational Stress Questionnaire    Feeling of Stress : Not at all  Social Connections: Moderately Integrated (10/16/2022)   Social Connection and Isolation Panel [NHANES]    Frequency of Communication with Friends and Family: More than three times a week    Frequency of Social Gatherings with Friends and Family: Once a week    Attends Religious Services: Never    Database administrator or Organizations: No    Attends Engineer, structural: More than 4 times per year    Marital Status: Married    Allergies:  Allergies  Allergen Reactions   Azithromycin Hives and Other (See Comments)   Sulfa Antibiotics Other (See Comments) and Rash    Hallucinations   Oxycodone Other (See Comments)    Makes head foggy and interferes with Meniere's syndrome.     Sulfacetamide    Beef-Derived Products Other (See Comments)    Tears up my stomach.    Objective:    Vital Signs:   Temp:  [97.6 F (36.4 C)-98.5 F (36.9 C)] 97.6 F (36.4 C) (05/06 1210) Pulse Rate:  [70-120] 70 (05/06 1403) Resp:  [11-34] 13 (05/06 1403) BP: (114-205)/(69-136) 129/69 (05/06 1300) SpO2:  [81 %-100 %] 96 % (05/06 1403) FiO2 (%):  [24 %-70 %] 24 % (05/06 1322) Weight:  [93 kg] 93 kg (05/06 0915) Last BM Date : 02/07/23  Weight change: Filed Weights   02/08/23 0915  Weight: 93 kg    Intake/Output:   Intake/Output Summary (Last 24 hours) at 02/08/2023 1544 Last data filed at 02/08/2023 1213 Gross per 24 hour  Intake 31.87 ml  Output 1200  ml  Net -1168.13 ml      Physical Exam    General: On Bipap HEENT: normal Neck: supple. JVP 8-9 cm. Carotids 2+ bilat; no bruits. No lymphadenopathy or thyromegaly appreciated. Cor: PMI nondisplaced. Regular rate & rhythm. No rubs, gallops or murmurs. Lungs: clear Abdomen: soft, nontender, nondistended. No hepatosplenomegaly. No bruits or masses. Good bowel sounds. Extremities: no cyanosis, clubbing, rash.  Trace ankle edema.  Neuro: alert & orientedx3, cranial nerves grossly intact. moves all 4 extremities w/o difficulty. Affect pleasant   Telemetry   NSR (personally reviewed)  EKG    NSR, LBBB 134 msec (personally reviewed)  Labs   Basic Metabolic Panel: Recent Labs  Lab 02/08/23 1327  NA 140  K 3.4*  CL 104  CO2 25  GLUCOSE 107*  BUN 19  CREATININE 0.73  CALCIUM 9.0  MG 1.7    Liver Function Tests: Recent Labs  Lab 02/08/23 1327  AST 20  ALT 21  ALKPHOS 100  BILITOT 0.8  PROT 7.1  ALBUMIN 3.9   No results for input(s): "LIPASE", "AMYLASE" in the last 168 hours. No results for input(s): "AMMONIA" in the last 168 hours.  CBC: Recent Labs  Lab 02/08/23 1327  WBC 8.4  HGB 11.2*  HCT 35.5*  MCV 86.2  PLT 292    Cardiac Enzymes: No results for input(s): "CKTOTAL", "CKMB", "CKMBINDEX", "TROPONINI" in the last 168 hours.  BNP: BNP (last 3 results) Recent Labs    02/08/23 1327  BNP 727.1*  ProBNP (last 3 results) No results for input(s): "PROBNP" in the last 8760 hours.   CBG: Recent Labs  Lab 02/08/23 1204  GLUCAP 120*    Coagulation Studies: No results for input(s): "LABPROT", "INR" in the last 72 hours.   Imaging   CARDIAC CATHETERIZATION  Result Date: 02/08/2023   Prox LAD lesion is 30% stenosed.   Mid LAD lesion is 40% stenosed.   There is moderate to severe left ventricular systolic dysfunction.   LV end diastolic pressure is severely elevated.   The left ventricular ejection fraction is 25-35% by visual estimate. 1.   Mild to moderate nonobstructive coronary artery disease.  The coronary arteries are mildly calcified overall. 2.  Moderately to severely reduced LV systolic function. 3.  Severely elevated left ventricular end-diastolic pressure at 35 mmHg. Recommendations: Continue medical therapy for nonischemic cardiomyopathy and non-obstructive coronary artery disease. I added small dose furosemide. Follow-up with advanced heart failure as planned.     Medications:     Current Medications:  carvedilol  6.25 mg Oral BID WC   furosemide  40 mg Intravenous Once   gabapentin  600 mg Oral QHS   mouth rinse  15 mL Mouth Rinse 4 times per day   [START ON 02/09/2023] pantoprazole  40 mg Oral BID AC   sacubitril-valsartan  1 tablet Oral BID   sodium chloride flush  3 mL Intravenous Q12H   spironolactone  12.5 mg Oral Daily   zolpidem  5 mg Oral QHS    Infusions:  sodium chloride     magnesium sulfate bolus IVPB     nitroGLYCERIN Stopped (02/08/23 1242)     Assessment/Plan   1. 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  No heavy ETOH/drugs.  No FH of dilated cardiomyopathy (has FH of CAD).  Patient was found recently to have LBBB, leading to echo in 4/24 showing EF 25-30%, moderate LV dilation, normal RV, and moderate MR.  Due to low EF, she was set up for LHC that was done today.  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  Patient was short of breath and was put on Bipap. BP was high, suspect flash pulmonary edema episode (did not take Entresto or Coreg today).  BP now improved on NTG gtt and breathing better after Lasix 40 mg IV x 1.  - Would repeat Lasix 40 mg IV again this evening.  - Can try to wean off Bipap.  - When she is off Bipap, will give her home Entresto this evening.  Would hold off on Coreg until tomorrow morning.  - Add spironolactone 12.5 daily  - Would try to add [place] tomorrow.  - She has LBBB 134 msec, probably not wide  enough to have significant benefit from CRT.  - Would arrange for cardiac MRI as outpatient to assess for infiltrative disease, myocarditis.  2. CAD: Nonobstructive.   - Check lipids and add statin.  3. HTN: Add back home meds as above and titrate off NTG gtt.   Length of Stay: 0  Marca Ancona, MD  02/08/2023, 3:44 PM  Advanced Heart Failure Team Pager [phone] (M-F; 7a - 5p)  Please contact [facility] for night-coverage after hours (4p -7a ) and weekends on [email or web address]

## 2023-02-08 NOTE — Assessment & Plan Note (Addendum)
Patient developed sudden onset shortness of breath with hypoxia and severe hypertension.  Symptoms have improved significantly with diuresis.  - Advanced heart failure team consulted; appreciate their recommendations - Continue BiPAP. Wean as tolerated.  - Continue Lasix 40 mg IV daily - Chest x-ray pending - CMP, troponin and BNP pending

## 2023-02-08 NOTE — Progress Notes (Signed)
Cardiac catheterization showed mild to moderate nonobstructive coronary artery disease.  LVEDP was was between 35 to 40 mmHg.  The patient was supposed to be on carvedilol and Entresto as an outpatient but did not take her blood pressure medications.  She did not receive much IV fluids before or after cath. In spite of that, the patient developed flash pulmonary edema in the recovery area with systolic blood pressure of 210/105 mmHg, mild tachycardia, frequent PVCs and severe hypoxia. I ordered Lasix 40 mg IV x 1, nitroglycerin drip and BiPAP. The patient could not urinate and spite of pure wick.  Thus, a Foley catheter was placed and there was 1200 mL of urine output. The patient felt significantly better and will be admitted to stepdown ICU. I consulted advanced heart failure.

## 2023-02-08 NOTE — Assessment & Plan Note (Signed)
Patient was noted to be hypertensive both pre and postcatheterization.  Blood pressure acutely increased in the setting of flash pulmonary edema.  She has held her home Entresto and carvedilol today. - Initiall on nitroglycerin infusion, weaned off - Restart home Entresto and carvedilol -- Started on Lasix, Marcelline Deist also

## 2023-02-08 NOTE — Progress Notes (Signed)
Pt noted with depressed O2 sats after return from cath lab, Lovell added at 1040 and incrementally increased to NRB at 1056, RT called for bi-pap at 1055  DR Arida at bedside with verbal orders and assessing pt

## 2023-02-08 NOTE — Assessment & Plan Note (Signed)
>>  ASSESSMENT AND PLAN FOR LUMBAR RADICULOPATHY WRITTEN ON 02/08/2023 12:58 PM BY ARNETT SAUNDERS, MD  - Continue home gabapentin  - Hold home meloxicam  in the setting of IV diuresis to avoid AKI.  Discussed with patient that long-term use of meloxicam  with Entresto  will place her at risk of kidney injury.  Encouraged her to discuss this with her PCP and cardiology team.

## 2023-02-08 NOTE — Interval H&P Note (Signed)
History and Physical Interval Note:  02/08/2023 10:06 AM  Katrina Poole  has presented today for surgery, with the diagnosis of L Cath   Cardiomyopathy  Reduced ejection fraction.  The various methods of treatment have been discussed with the patient and family. After consideration of risks, benefits and other options for treatment, the patient has consented to  Procedure(s): LEFT HEART CATH AND CORONARY ANGIOGRAPHY (Left) as a surgical intervention.  The patient's history has been reviewed, patient examined, no change in status, stable for surgery.  I have reviewed the patient's chart and labs.  Questions were answered to the patient's satisfaction.     Lorine Bears

## 2023-02-08 NOTE — Assessment & Plan Note (Addendum)
-   Continue home gabapentin - Hold home meloxicam in the setting of IV diuresis to avoid AKI.  Discussed with patient that long-term use of meloxicam with Sherryll Burger will place her at risk of kidney injury.  Encouraged her to discuss this with her PCP and cardiology team.

## 2023-02-08 NOTE — H&P (Addendum)
History and Physical    Patient: Katrina Poole:[id number] DOB: December 16, 1945 DOA: 02/08/2023 DOS: the patient was seen and examined on 02/08/2023 PCP: Reubin Milan, MD  Patient coming from: Home  Chief Complaint: SOB, post-cath  HPI: Katrina Poole is a 77 y.o. female with medical history significant of HFrEF with last EF of 25-30%, non-obstructive CAD, hypertension, lumbar spinal stenosis, who presented to the ED for an outpatient catheterization, currently requiring admission for sudden onset shortness of breath.  [name] states she was in her usual state of health prior to coming in today.  She denied any shortness of breath prior to arrival but notes she has been experiencing some lower extremity swelling on and off.  She denies any orthopnea at home.  She has never taken Lasix at home prior to this.  After the cath, she noted very sudden onset shortness of breath and chest pain.  Chest pain has since resolved and shortness of breath has improved significantly.   Her last doses of her all her home medications was yesterday evening.  Hospital course: Precatheterization, patient was noted to be hypertensive at 177/90 with heart rate of 78.  She was saturating at 94% on 2 L.  Postcatheterization, patient was noted to develop flash pulmonary edema with hypoxia as low as 83% and blood pressure of 210/105.  Patient was started on Lasix, nitroglycerin drip and BiPAP.  Due to inability to urinate, Foley catheter was placed with 1.2 L of output.  TRH contacted for admission.  Review of Systems: As mentioned in the history of present illness. All other systems reviewed and are negative.  Past Medical History:  Diagnosis Date   Aortic atherosclerosis (HCC)    Arthritis    osteo of left hip   Asthma    humidity and exercise during summer   GERD (gastroesophageal reflux disease)    Hypertension    controlled on meds   Lumbago of lumbar region with sciatica 06/12/2021   Meniere  syndrome    hearing deaf right ear   Neuromuscular disorder (HCC)    finger tips go numb, neck vertebrae   Restless leg syndrome    Spondylolisthesis, lumbosacral region    Vertigo    Wears dentures    full dentures on top, partial bottom   Past Surgical History:  Procedure Laterality Date   ABDOMINAL EXPOSURE N/A 10/09/2022   Procedure: ABDOMINAL EXPOSURE;  Surgeon: Cephus Shelling, MD;  Location: [facility] OR;  Service: Vascular;  Laterality: N/A;   ANTERIOR CERVICAL DECOMP/DISCECTOMY FUSION N/A 03/16/2018   Procedure: Anterior Cervical Discectomy Fusion - Cervical Three- Cervical Four, Cervical Four- Cervical Five, Cervical Five-Cervical Six, Cervical Six- Cerival Seven;  Surgeon: Donalee Citrin, MD;  Location: [facility] OR;  Service: Neurosurgery;  Laterality: N/A;  Anterior Cervical Discectomy Fusion - Cervical Three- Cervical Four, Cervical Four- Cervical Five, Cervical Five-Cervical Six, Cervical Six- Ceriva   ANTERIOR LUMBAR FUSION N/A 10/09/2022   Procedure: Anteriorr Lumbar Interbody Fusion - Level Five-Sacral One  with posterior cortical screw augmentation and decompressive laminotomy on the right;  Surgeon: Donalee Citrin, MD;  Location: [facility] OR;  Service: Neurosurgery;  Laterality: N/A;   BUNIONECTOMY     Left x1, Right x2.   CARDIAC CATHETERIZATION     x2 both normal   CARPAL TUNNEL RELEASE Right    COLONOSCOPY     COLONOSCOPY WITH PROPOFOL N/A 06/04/2016   Procedure: COLONOSCOPY WITH PROPOFOL;  Surgeon: Christena Deem, MD;  Location: [facility] ENDOSCOPY;  Service:  Endoscopy;  Laterality: N/A;   DILATION AND CURETTAGE OF UTERUS     ECTOPIC PREGNANCY SURGERY     ESOPHAGOGASTRODUODENOSCOPY (EGD) WITH PROPOFOL N/A 06/04/2016   Procedure: ESOPHAGOGASTRODUODENOSCOPY (EGD) WITH PROPOFOL;  Surgeon: Christena Deem, MD;  Location: [facility] ENDOSCOPY;  Service: Endoscopy;  Laterality: N/A;   exploratory laparoscopy     infertility surgery   HALLUX VALGUS CORRECTION Bilateral    JOINT REPLACEMENT Bilateral     partial knee   KNEE ARTHROPLASTY Left 01/27/2021   Procedure: CONVERSION OF LEFT PARTIAL KNEE TO LEFT TOTAL KNEE.;  Surgeon: Donato Heinz, MD;  Location: [facility] ORS;  Service: Orthopedics;  Laterality: Left;   KNEE ARTHROSCOPY Left 07/19/2015   Procedure: Arthroscopic limited synovectomy along with lateral compartment and retropatellar chondral debridement plus removal of several small loose bodies;  Surgeon: Erin Sons, MD;  Location: [facility] SURGERY [facility]TR;  Service: Orthopedics;  Laterality: Left;  1ST CASE PER CECE   LAMINECTOMY WITH POSTERIOR LATERAL ARTHRODESIS LEVEL 2 N/A 10/09/2022   Procedure: LAMINECTOMY WITH POSTERIOR LATERAL ARTHRODESIS LEVEL TWO;  Surgeon: Donalee Citrin, MD;  Location: [facility] OR;  Service: Neurosurgery;  Laterality: N/A;   LUMBAR LAMINECTOMY/DECOMPRESSION MICRODISCECTOMY Right 04/29/2020   Procedure: Microdiscectomy - Lumbar Three-Lumbar Four - Lumbar Four-Lumbar Five - right;  Surgeon: Donalee Citrin, MD;  Location: [facility] OR;  Service: Neurosurgery;  Laterality: Right;  Microdiscectomy - Lumbar Three-Lumbar Four - Lumbar Four-Lumbar Five - right   MEDIAL PARTIAL KNEE REPLACEMENT Bilateral    POSTERIOR LUMBAR FUSION  02/2022   L3-4 and L4-5   SHOULDER SURGERY Right    TOTAL HIP ARTHROPLASTY Left 11/25/2015   Procedure: TOTAL HIP ARTHROPLASTY;  Surgeon: Donato Heinz, MD;  Location: [facility] ORS;  Service: Orthopedics;  Laterality: Left;   TRIGGER FINGER RELEASE Left    Social History:  reports that she quit smoking about 32 years ago. Her smoking use included cigarettes. She has a 45.00 pack-year smoking history. She has never used smokeless tobacco. She reports current alcohol use of about 1.0 standard drink of alcohol per week. She reports that she does not use drugs.  Allergies  Allergen Reactions   Azithromycin Hives and Other (See Comments)   Sulfa Antibiotics Other (See Comments) and Rash    Hallucinations   Oxycodone Other (See Comments)    Makes head foggy and  interferes with Meniere's syndrome.     Sulfacetamide    Beef-Derived Products Other (See Comments)    Tears up my stomach.    Family History  Problem Relation Age of Onset   Heart failure Mother    COPD Mother    Hypertension Mother    Cancer Father    Heart attack Brother    Heart attack Brother 47   Heart attack Brother    Heart disease Brother     Prior to Admission medications   Medication Sig Start Date End Date Taking? Authorizing Provider  acetaminophen (TYLENOL) 500 MG tablet Take 2 tablets (1,000 mg total) by mouth every 6 (six) hours as needed (DO NOT EXCEED 4000 MG DAILY FROM ALL SOURCES). 03/30/21  Yes Reubin Milan, MD  acetaminophen-codeine (TYLENOL #3) 300-30 MG tablet Take 1-2 tablets by mouth every 6 (six) hours as needed for moderate pain. 10/10/22  Yes Bedelia Person, MD  albuterol (VENTOLIN HFA) 108 (90 Base) MCG/ACT inhaler Inhale 2 puffs into the lungs every 6 (six) hours as needed for wheezing or shortness of breath. 03/09/22  Yes Reubin Milan, MD  alum hydroxide-mag  trisilicate (GAVISCON) 80-20 MG CHEW chewable tablet Chew 1 tablet by mouth daily as needed for indigestion or heartburn.    Yes [provider]  azelastine (ASTELIN) 0.1 % nasal spray Place 2 sprays into both nostrils 2 (two) times daily. Use in each nostril as directed 01/14/23  Yes Jerrol Banana, MD  carvedilol (COREG) 6.25 MG tablet Take 1 tablet (6.25 mg total) by mouth 2 (two) times daily. 01/25/23  Yes Agbor-Etang, Arlys John, MD  cephALEXin (KEFLEX) 500 MG capsule TAKE 4 PRIOR TO DENTAL PROCEDURES 01/13/23  Yes Reubin Milan, MD  cholecalciferol (VITAMIN D3) 25 MCG (1000 UNIT) tablet Take 1,000 Units by mouth at bedtime.   Yes [provider]  clonazePAM (KLONOPIN) 0.5 MG tablet Take 1 tablet (0.5 mg total) by mouth at bedtime as needed for anxiety. 09/14/22  Yes Reubin Milan, MD  furosemide (LASIX) 20 MG tablet Take 1 tablet (20 mg total) by mouth daily. 02/08/23  02/08/24 Yes Iran Ouch, MD  gabapentin (NEURONTIN) 300 MG capsule TAKE 1 TO 2 CAPSULES BY MOUTH AT BEDTIME 10/12/22  Yes Reubin Milan, MD  meclizine (ANTIVERT) 12.5 MG tablet Take 12.5 mg by mouth 3 (three) times daily as needed for dizziness. Dr. Eduard Clos   Yes [provider]  meloxicam (MOBIC) 7.5 MG tablet Take 1 tablet (7.5 mg total) by mouth 2 (two) times daily. 12/07/22  Yes Reubin Milan, MD  Multiple Vitamin (MULTIVITAMIN) tablet Take 1 tablet by mouth every morning.   Yes [provider]  omeprazole (PRILOSEC) 20 MG capsule TAKE 1 CAPSULE BY MOUTH TWICE DAILY BEFORE A MEAL 01/10/23  Yes Reubin Milan, MD  potassium chloride (KLOR-CON M) 10 MEQ tablet Take 1 tablet (10 mEq total) by mouth daily. 02/08/23  Yes Iran Ouch, MD  Probiotic Product (PROBIOTIC DAILY PO) Take 1 capsule by mouth daily.   Yes [provider]  promethazine-dextromethorphan (PROMETHAZINE-DM) 6.25-15 MG/5ML syrup Take 5 mLs by mouth 4 (four) times daily as needed for cough. 01/14/23  Yes Jerrol Banana, MD  sacubitril-valsartan (ENTRESTO) 24-26 MG Take 1 tablet by mouth 2 (two) times daily. 01/25/23  Yes Agbor-Etang, Arlys John, MD  traMADol (ULTRAM) 50 MG tablet Take 50 mg by mouth every 6 (six) hours as needed for moderate pain. 09/04/22  Yes [provider]  zolpidem (AMBIEN) 5 MG tablet TAKE 1 TABLET BY MOUTH AT BEDTIME 01/24/23  Yes Reubin Milan, MD    Physical Exam: Vitals:   02/08/23 1210 02/08/23 1215 02/08/23 1220 02/08/23 1225  BP: 139/81  124/72   Pulse: 95 84 81 81  Resp: (!) 24 19 (!) 26 15  Temp: 97.6 F (36.4 C)     TempSrc: Axillary     SpO2: 98% 98% 92% 90%  Weight:      Height:       Physical Exam Vitals and nursing note reviewed.  Constitutional:      General: She is not in acute distress.    Appearance: She is obese. She is not toxic-appearing.  HENT:     Head: Normocephalic and atraumatic.  Eyes:     Conjunctiva/sclera: Conjunctivae  normal.  Cardiovascular:     Rate and Rhythm: Normal rate and regular rhythm.     Heart sounds: No murmur heard. Pulmonary:     Effort: Pulmonary effort is normal.     Breath sounds: No wheezing, rhonchi or rales.  Musculoskeletal:     Comments: Trivial bilateral lower extremity edema  Skin:    General: Skin is warm and dry.  Neurological:     General: No focal deficit present.     Mental Status: She is alert and oriented to person, place, and time.  Psychiatric:        Mood and Affect: Mood normal.        Behavior: Behavior normal.    Data Reviewed: CBG this morning of 120.  CBC, CMP, magnesium, troponin and BNP pending EKG pending. BMP obtained approximately 12 days ago with creatinine of 0.82 and BUN of 25 with GFR above 60.  Results are pending, will review when available.  Assessment and Plan:  * Flash pulmonary edema (HCC) Patient developed sudden onset shortness of breath with hypoxia and severe hypertension.  Symptoms have improved significantly with diuresis.  - Advanced heart failure team consulted; appreciate their recommendations - Continue BiPAP. Wean as tolerated.  - Continue Lasix 40 mg IV daily - Chest x-ray pending - CMP, troponin and BNP pending  HFrEF (heart failure with reduced ejection fraction) (HCC) History of HFrEF with last EF of 25-30% in April 2024.  Etiology undetermined at this time.  Left heart cath today with nonobstructive CAD.  - Cardiology consulted; appreciate their recommendations - Restart home GDMT - TSH ordered  Essential hypertension Patient was noted to be hypertensive both pre and postcatheterization.  Blood pressure acutely increased in the setting of flash pulmonary edema.  She has held her home Entresto and carvedilol today.  - Continue nitroglycerin infusion with goal SBP below 160 - Wean as tolerated - Restart home Entresto and carvedilol  Acute urinary retention In the setting of flash pulmonary edema.  Foley  catheter has been placed in the Cath Lab.  - Continue Foley catheter for now - Voiding trial tomorrow  CAD (coronary artery disease) LHC today with evidence of mild to moderate nonobstructive CAD.  Patient is not currently on a statin.  - Lipid panel pending  Prediabetes Diet controlled only at this time.  Last A1c within normal limits at 5.5%.  - A1c pending  Lumbar radiculopathy - Continue home gabapentin - Hold home meloxicam in the setting of IV diuresis to avoid AKI.  Discussed with patient that long-term use of meloxicam with Sherryll Burger will place her at risk of kidney injury.  Encouraged her to discuss this with her PCP and cardiology team.  Advance Care Planning:   Code Status: Full Code   Consults: Cardiology and advanced heart failure team  Family Communication: Patient's husband updated at bedside  Severity of Illness: The appropriate patient status for this patient is OBSERVATION. Observation status is judged to be reasonable and necessary in order to provide the required intensity of service to ensure the patient's safety. The patient's presenting symptoms, physical exam findings, and initial radiographic and laboratory data in the context of their medical condition is felt to place them at decreased risk for further clinical deterioration. Furthermore, it is anticipated that the patient will be medically stable for discharge from the hospital within 2 midnights of admission.   Author: Verdene Lennert, MD 02/08/2023 1:04 PM  For on call review [email or web address].

## 2023-02-08 NOTE — Assessment & Plan Note (Signed)
In the setting of flash pulmonary edema.  Foley catheter has been placed in the Cath Lab. Foley removed, voiding well.

## 2023-02-09 ENCOUNTER — Encounter: Payer: Self-pay | Admitting: Cardiovascular Disease

## 2023-02-09 LAB — LIPID PANEL
Cholesterol: 184 mg/dL (ref 0–200)
HDL: 61 mg/dL (ref >40)
LDL Cholesterol: 107 mg/dL — ABNORMAL HIGH (ref 0–99)
Total CHOL/HDL Ratio: 3 RATIO
Triglycerides: 81 mg/dL (ref <150)
VLDL: 16 mg/dL (ref 0–40)

## 2023-02-09 LAB — BASIC METABOLIC PANEL
Anion gap: 10 (ref 5–15)
BUN: 23 mg/dL (ref 8–23)
CO2: 28 mmol/L (ref 22–32)
Calcium: 8.9 mg/dL (ref 8.9–10.3)
Chloride: 101 mmol/L (ref 98–111)
Creatinine, Ser: 0.86 mg/dL (ref 0.44–1.00)
GFR, Estimated: >60 mL/min (ref >60)
Glucose, Bld: 100 mg/dL — ABNORMAL HIGH (ref 70–99)
Potassium: 3.3 mmol/L — ABNORMAL LOW (ref 3.5–5.1)
Sodium: 139 mmol/L (ref 135–145)

## 2023-02-09 MED ORDER — CHLORHEXIDINE GLUCONATE CLOTH 2 % EX PADS
6.0000 | MEDICATED_PAD | Freq: Every day | CUTANEOUS | Status: DC
  Administered 2023-02-09: 6 via TOPICAL

## 2023-02-09 MED ORDER — SPIRONOLACTONE 25 MG PO TABS
25.0000 mg | ORAL_TABLET | Freq: Every day | ORAL | Status: DC
  Administered 2023-02-09: 25 mg via ORAL
  Filled 2023-02-09: qty 1

## 2023-02-09 MED ORDER — DAPAGLIFLOZIN PROPANEDIOL 10 MG PO TABS
10.0000 mg | ORAL_TABLET | Freq: Every day | ORAL | Status: DC
  Administered 2023-02-09: 10 mg via ORAL
  Filled 2023-02-09: qty 1

## 2023-02-09 MED ORDER — DAPAGLIFLOZIN PROPANEDIOL 10 MG PO TABS: 10.0000 mg | ORAL_TABLET | Freq: Every day | ORAL | 1 refills | Status: DC

## 2023-02-09 MED ORDER — FUROSEMIDE 20 MG PO TABS
20.0000 mg | ORAL_TABLET | Freq: Every day | ORAL | Status: DC
  Administered 2023-02-09: 20 mg via ORAL
  Filled 2023-02-09: qty 1

## 2023-02-09 MED ORDER — POTASSIUM CHLORIDE CRYS ER 20 MEQ PO TBCR
40.0000 meq | EXTENDED_RELEASE_TABLET | Freq: Once | ORAL | Status: AC
  Administered 2023-02-09: 40 meq via ORAL
  Filled 2023-02-09: qty 2

## 2023-02-09 MED ORDER — ROSUVASTATIN CALCIUM 10 MG PO TABS: 10.0000 mg | ORAL_TABLET | Freq: Every day | ORAL | 2 refills | Status: DC

## 2023-02-09 MED ORDER — SPIRONOLACTONE 25 MG PO TABS: 25.0000 mg | ORAL_TABLET | Freq: Every day | ORAL | 2 refills | Status: DC

## 2023-02-09 NOTE — Progress Notes (Signed)
Ambulated 300 feet on room air.  Patient Saturations on Room Air at Rest = 95%  Patient Saturations on [employer] while Ambulating = 91%

## 2023-02-09 NOTE — TOC CM/SW Note (Signed)
Patient has orders to discharge home today. Chart reviewed. No TOC needs identified. CSW signing off.  Shia Delaine, CSW [phone]  

## 2023-02-09 NOTE — Progress Notes (Signed)
Patient ID: SHONDRIA GARDUNO, female   DOB: 1945/11/09, 77 y.o.   MRN: [id number]     Advanced Heart Failure Rounding Note  PCP-Cardiologist: Debbe Odea, MD   Subjective:    Very good diuresis yesterday (2 doses of IV Lasix), feels much better.  Now on room air.    Objective:   Weight Range: 81 kg Body mass index is 32.66 kg/m.   Vital Signs:   Temp:  [97.6 F (36.4 C)-98.5 F (36.9 C)] 98.3 F (36.8 C) (05/07 0400) Pulse Rate:  [61-120] 61 (05/07 0700) Resp:  [11-34] 14 (05/07 0700) BP: (114-205)/(56-136) 132/61 (05/07 0700) SpO2:  [81 %-100 %] 91 % (05/07 0700) FiO2 (%):  [24 %-70 %] 24 % (05/06 1322) Weight:  [81 kg-93 kg] 81 kg (05/07 0500) Last BM Date : 02/07/23  Weight change: Filed Weights   02/08/23 0915 02/09/23 0500  Weight: 93 kg 81 kg    Intake/Output:   Intake/Output Summary (Last 24 hours) at 02/09/2023 0819 Last data filed at 02/09/2023 0400 Gross per 24 hour  Intake 134.56 ml  Output 4450 ml  Net -4315.44 ml      Physical Exam    General:  Well appearing. No resp difficulty HEENT: Normal Neck: Supple. JVP 8 cm. Carotids 2+ bilat; no bruits. No lymphadenopathy or thyromegaly appreciated. Cor: PMI nondisplaced. Regular rate & rhythm. No rubs, gallops or murmurs. Lungs: Clear Abdomen: Soft, nontender, nondistended. No hepatosplenomegaly. No bruits or masses. Good bowel sounds. Extremities: No cyanosis, clubbing, rash.  No edema.  Neuro: Alert & orientedx3, cranial nerves grossly intact. moves all 4 extremities w/o difficulty. Affect pleasant   Telemetry   NSR 70s (personally reviewed)  Labs    CBC Recent Labs    02/08/23 1327  WBC 8.4  HGB 11.2*  HCT 35.5*  MCV 86.2  PLT 292   Basic Metabolic Panel Recent Labs    40/98/11 1327 02/09/23 0507  NA 140 139  K 3.4* 3.3*  CL 104 101  CO2 25 28  GLUCOSE 107* 100*  BUN 19 23  CREATININE 0.73 0.86  CALCIUM 9.0 8.9  MG 1.7  --    Liver Function Tests Recent Labs     02/08/23 1327  AST 20  ALT 21  ALKPHOS 100  BILITOT 0.8  PROT 7.1  ALBUMIN 3.9   No results for input(s): "LIPASE", "AMYLASE" in the last 72 hours. Cardiac Enzymes No results for input(s): "CKTOTAL", "CKMB", "CKMBINDEX", "TROPONINI" in the last 72 hours.  BNP: BNP (last 3 results) Recent Labs    02/08/23 1327  BNP 727.1*    ProBNP (last 3 results) No results for input(s): "PROBNP" in the last 8760 hours.   D-Dimer No results for input(s): "DDIMER" in the last 72 hours. Hemoglobin A1C Recent Labs    02/08/23 1327  HGBA1C 5.4   Fasting Lipid Panel Recent Labs    02/09/23 0507  CHOL 184  HDL 61  LDLCALC 107*  TRIG 81  CHOLHDL 3.0   Thyroid Function Tests Recent Labs    02/08/23 1327  TSH 1.615    Other results:   Imaging    CARDIAC CATHETERIZATION  Result Date: 02/08/2023   Prox LAD lesion is 30% stenosed.   Mid LAD lesion is 40% stenosed.   There is moderate to severe left ventricular systolic dysfunction.   LV end diastolic pressure is severely elevated.   The left ventricular ejection fraction is 25-35% by visual estimate. 1.  Mild to moderate nonobstructive coronary  artery disease.  The coronary arteries are mildly calcified overall. 2.  Moderately to severely reduced LV systolic function. 3.  Severely elevated left ventricular end-diastolic pressure at 35 mmHg. Recommendations: Continue medical therapy for nonischemic cardiomyopathy and non-obstructive coronary artery disease. I added small dose furosemide. Follow-up with advanced heart failure as planned.     Medications:     Scheduled Medications:  carvedilol  6.25 mg Oral BID WC   Chlorhexidine Gluconate Cloth  6 each Topical Daily   dapagliflozin propanediol  10 mg Oral Daily   furosemide  20 mg Oral Daily   gabapentin  600 mg Oral QHS   mouth rinse  15 mL Mouth Rinse 4 times per day   pantoprazole  40 mg Oral BID AC   potassium chloride  40 mEq Oral Once   rosuvastatin  10 mg Oral Daily    sacubitril-valsartan  1 tablet Oral BID   sodium chloride flush  3 mL Intravenous Q12H   spironolactone  25 mg Oral Daily   zolpidem  5 mg Oral QHS    Infusions:  sodium chloride     nitroGLYCERIN Stopped (02/08/23 1242)    PRN Medications: sodium chloride, acetaminophen, albuterol, clonazePAM, ondansetron (ZOFRAN) IV, mouth rinse, sodium chloride flush, traMADol    Assessment/Plan   1. 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  No heavy ETOH/drugs.  No FH of dilated cardiomyopathy (has FH of CAD).  Patient was found recently to have LBBB, leading to echo in 4/24 showing EF 25-30%, moderate LV dilation, normal RV, and moderate MR.  Due to low EF, she was set up for LHC that was done yesterday.  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  Patient was short of breath and was put on Bipap. BP was high, suspect flash pulmonary edema episode (did not take Entresto or Coreg yesterday).  She diuresed very well with 2 doses of IV Lasix, now on room air. Probably only mild residual volume overload by exam.  - Continue Entresto 24/26 bid.  - Continue Coreg 6.25 mg bid.  - Increase spironolactone to 25 mg daily  - Add Farxiga 10 mg daily. - Start Lasix 20 mg po daily for home (was not on Lasix prior to admission) and KCl 10 daily.   - She has LBBB 134 msec, probably not wide enough to have significant benefit from CRT.  - Would arrange for cardiac MRI as outpatient to assess for infiltrative disease, myocarditis.  2. CAD: Nonobstructive.   - Started Crestor 10 mg daily with elevated LDL and nonobstructive CAD.  3. HTN: Controlled.  4. Disposition: Should walk around unit and if does fine, can go home today.  Followup to be arranged in CHF clinic.  Cardiac meds for home: Entresto 24/26 bid, Coreg 6.25 mg bid, spironolactone 25 daily, Farxiga 10 mg daily, Lasix 20 mg daily, KCl 10 mEq daily, Crestor 10 mg daily.      Length of Stay:  1  Marca Ancona, MD  02/09/2023, 8:19 AM  Advanced Heart Failure Team Pager [phone] (M-F; 7a - 5p)  Please contact [facility] for night-coverage after hours (5p -7a ) and weekends on [email or web address]

## 2023-02-09 NOTE — Progress Notes (Signed)
Patient and Husband educated on AVS- instructed on discharge instructions, New medications, where to pick up medications, when to call provider, and follow up appointments.  All questions answered at this time.  Patient getting dressed at this time and packing belongings.

## 2023-02-09 NOTE — Plan of Care (Signed)

## 2023-02-09 NOTE — Discharge Summary (Signed)
Physician Discharge Summary   Patient: Katrina Poole MRN: [id number] DOB: 09-Feb-1946  Admit date:     02/08/2023  Discharge date: 02/09/2023  Discharge Physician: Pennie Banter   PCP: Reubin Milan, MD   Recommendations at discharge:    Follow up with Cardiology Follow up with Primary Care Repeat BMP, Mg, CBC in 1-2 weeks  Discharge Diagnoses: Principal Problem:   Flash pulmonary edema (HCC) Active Problems:   Acute on chronic HFrEF (heart failure with reduced ejection fraction) (HCC)   Essential hypertension   CAD (coronary artery disease)   Lumbar radiculopathy   Prediabetes   Cardiomyopathy (HCC)  Resolved Problems:   Acute urinary retention  Hospital Course:  HPI on admission 02/08/23 per Dr. Huel Cote: "Katrina Poole is a 77 y.o. female with medical history significant of HFrEF with last EF of 25-30%, non-obstructive CAD, hypertension, lumbar spinal stenosis, who presented to the ED for an outpatient catheterization, currently requiring admission for sudden onset shortness of breath.   Katrina Poole states she was in her usual state of health prior to coming in today.  She denied any shortness of breath prior to arrival but notes she has been experiencing some lower extremity swelling on and off.  She denies any orthopnea at home.  She has never taken Lasix at home prior to this.  After the cath, she noted very sudden onset shortness of breath and chest pain.  Chest pain has since resolved and shortness of breath has improved significantly.    Her last doses of her all her home medications was yesterday evening.   Hospital course: Precatheterization, patient was noted to be hypertensive at 177/90 with heart rate of 78.  She was saturating at 94% on 2 L.  Postcatheterization, patient was noted to develop flash pulmonary edema with hypoxia as low as 83% and blood pressure of 210/105.  Patient was started on Lasix, nitroglycerin drip and BiPAP.  Due to inability to  urinate, Foley catheter was placed with 1.2 L of output.  TRH contacted for admission."   02/09/23 -- pt doing well, weaned to room air.  Advance heart failure team / cardiology has cleared her for discharge.  Pt ambulated around the unit, maintained O2 sats on room air with exertion.   Pt is medically stable and agreeable to discharge home today.   Assessment and Plan: * Flash pulmonary edema (HCC) Patient developed sudden onset shortness of breath with hypoxia and severe hypertension.  Symptoms have improved significantly with diuresis.  Required BiPAP on admission. Now weaned to room air s/p diuresis. - Advanced heart failure team consulted; appreciate their recommendations --See Acute on Chronic HFrEF for medications  Acute on chronic HFrEF (heart failure with reduced ejection fraction) (HCC) See also - Flash Pulmonary Edema History of HFrEF with last EF of 25-30% in April 2024.  Left heart cath with nonobstructive CAD. - Cardiology consulted; appreciate their recommendations - Continue Entresto 24/26 bid.  - Continue Coreg 6.25 mg bid.  - Increase spironolactone to 25 mg daily  - Add Farxiga 10 mg daily. - Start Lasix 20 mg po daily for home (was not on Lasix prior to admission) and KCl 10 daily.   - cardiac MRI as outpatient to assess for infiltrative disease, myocarditis.   Essential hypertension Patient was noted to be hypertensive both pre and postcatheterization.  Blood pressure acutely increased in the setting of flash pulmonary edema.  She has held her home Entresto and carvedilol today. - Initiall on nitroglycerin  infusion, weaned off - Restart home Entresto and carvedilol -- Started on Lasix, Marcelline Deist also  Acute urinary retention-resolved as of 02/09/2023 In the setting of flash pulmonary edema.  Foley catheter has been placed in the Cath Lab. Foley removed, voiding well.  CAD (coronary artery disease) LHC today with evidence of mild to moderate nonobstructive CAD.   Patient is not currently on a statin. --Started on Crestor 10 mg daily  Cardiomyopathy (HCC) .  Prediabetes Diet controlled only at this time.  Last A1c within normal limits at 5.5%.  - A1c pending  Lumbar radiculopathy - Continue home gabapentin - Hold home meloxicam in the setting of IV diuresis to avoid AKI.  Discussed with patient that long-term use of meloxicam with Sherryll Burger will place her at risk of kidney injury.  Encouraged her to discuss this with her PCP and cardiology team.         Consultants: Cardiology / Advanced Heart Failure Team Procedures performed: None  Disposition: Home Diet recommendation:  Cardiac diet DISCHARGE MEDICATION: Allergies as of 02/09/2023       Reactions   Azithromycin Hives, Other (See Comments)   Sulfa Antibiotics Other (See Comments), Rash   Hallucinations   Oxycodone Other (See Comments)   Makes head foggy and interferes with Meniere's syndrome.     Sulfacetamide    Beef-derived Products Other (See Comments)   Tears up my stomach.        Medication List     TAKE these medications    acetaminophen 500 MG tablet Commonly known as: TYLENOL Take 2 tablets (1,000 mg total) by mouth every 6 (six) hours as needed (DO NOT EXCEED 4000 MG DAILY FROM ALL SOURCES).   acetaminophen-codeine 300-30 MG tablet Commonly known as: TYLENOL #3 Take 1-2 tablets by mouth every 6 (six) hours as needed for moderate pain.   albuterol 108 (90 Base) MCG/ACT inhaler Commonly known as: VENTOLIN HFA Inhale 2 puffs into the lungs every 6 (six) hours as needed for wheezing or shortness of breath.   alum hydroxide-mag trisilicate 80-20 MG Chew chewable tablet Commonly known as: GAVISCON Chew 1 tablet by mouth daily as needed for indigestion or heartburn.   azelastine 0.1 % nasal spray Commonly known as: ASTELIN Place 2 sprays into both nostrils 2 (two) times daily. Use in each nostril as directed   carvedilol 6.25 MG tablet Commonly known as:  COREG Take 1 tablet (6.25 mg total) by mouth 2 (two) times daily.   cephALEXin 500 MG capsule Commonly known as: KEFLEX TAKE 4 PRIOR TO DENTAL PROCEDURES   cholecalciferol 25 MCG (1000 UNIT) tablet Commonly known as: VITAMIN D3 Take 1,000 Units by mouth at bedtime.   clonazePAM 0.5 MG tablet Commonly known as: KLONOPIN Take 1 tablet (0.5 mg total) by mouth at bedtime as needed for anxiety.   dapagliflozin propanediol 10 MG Tabs tablet Commonly known as: FARXIGA Take 1 tablet (10 mg total) by mouth daily. Start taking on: Feb 10, 2023   Entresto 24-26 MG Generic drug: sacubitril-valsartan Take 1 tablet by mouth 2 (two) times daily.   furosemide 20 MG tablet Commonly known as: Lasix Take 1 tablet (20 mg total) by mouth daily.   gabapentin 300 MG capsule Commonly known as: NEURONTIN TAKE 1 TO 2 CAPSULES BY MOUTH AT BEDTIME   meclizine 12.5 MG tablet Commonly known as: ANTIVERT Take 12.5 mg by mouth 3 (three) times daily as needed for dizziness. Dr. Eduard Clos   meloxicam 7.5 MG tablet Commonly known as: MOBIC Take  1 tablet (7.5 mg total) by mouth 2 (two) times daily.   multivitamin tablet Take 1 tablet by mouth every morning.   omeprazole 20 MG capsule Commonly known as: PRILOSEC TAKE 1 CAPSULE BY MOUTH TWICE DAILY BEFORE A MEAL   potassium chloride 10 MEQ tablet Commonly known as: KLOR-CON M Take 1 tablet (10 mEq total) by mouth daily.   PROBIOTIC DAILY PO Take 1 capsule by mouth daily.   promethazine-dextromethorphan 6.25-15 MG/5ML syrup Commonly known as: PROMETHAZINE-DM Take 5 mLs by mouth 4 (four) times daily as needed for cough.   rosuvastatin 10 MG tablet Commonly known as: CRESTOR Take 1 tablet (10 mg total) by mouth daily. Start taking on: Feb 10, 2023   spironolactone 25 MG tablet Commonly known as: ALDACTONE Take 1 tablet (25 mg total) by mouth daily. Start taking on: Feb 10, 2023   traMADol 50 MG tablet Commonly known as: ULTRAM Take 50 mg by  mouth every 6 (six) hours as needed for moderate pain.   zolpidem 5 MG tablet Commonly known as: AMBIEN TAKE 1 TABLET BY MOUTH AT BEDTIME        Follow-up Information     Sondra Barges, PA-C Follow up on 02/23/2023.   Specialties: Cardiology, Radiology Why: APPOINTMENT MADE FOR Contact information: [address] [place] [address] [phone]                Discharge Exam: Filed Weights   02/08/23 0915 02/09/23 0500  Weight: 93 kg 81 kg   General exam: awake, alert, no acute distress HEENT: atraumatic, clear conjunctiva, anicteric sclera, moist mucus membranes, hearing grossly normal  Respiratory system: CTAB, no wheezes, rales or rhonchi, normal respiratory effort. Cardiovascular system: normal S1/S2, RRR, no JVD, murmurs, rubs, gallops, no pedal edema.   Gastrointestinal system: soft, NT, ND, no HSM felt, +bowel sounds. Central nervous system: A&O x 4. no gross focal neurologic deficits, normal speech Extremities: moves all, no edema, normal tone Skin: dry, intact, normal temperature, normal color, No rashes, lesions or ulcers Psychiatry: normal mood, congruent affect, judgement and insight appear normal   Condition at discharge: stable  The results of significant diagnostics from this hospitalization (including imaging, microbiology, ancillary and laboratory) are listed below for reference.   Imaging Studies: CARDIAC CATHETERIZATION  Result Date: 02/08/2023   Prox LAD lesion is 30% stenosed.   Mid LAD lesion is 40% stenosed.   There is moderate to severe left ventricular systolic dysfunction.   LV end diastolic pressure is severely elevated.   The left ventricular ejection fraction is 25-35% by visual estimate. 1.  Mild to moderate nonobstructive coronary artery disease.  The coronary arteries are mildly calcified overall. 2.  Moderately to severely reduced LV systolic function. 3.  Severely elevated left ventricular end-diastolic pressure at 35  mmHg. Recommendations: Continue medical therapy for nonischemic cardiomyopathy and non-obstructive coronary artery disease. I added small dose furosemide. Follow-up with advanced heart failure as planned.   ECHOCARDIOGRAM COMPLETE  Result Date: 01/23/2023    ECHOCARDIOGRAM REPORT   Patient Name:   TAMMEY TUNNEY Date of Exam: 01/22/2023 Medical Rec #:  [id number]         Height:       59.0 in Accession #:    [id number]        Weight:       209.0 lb Date of Birth:  30-Apr-1946         BSA:  1.879 m Patient Age:    76 years          BP:           132/74 mmHg Patient Gender: F                 HR:           75 bpm. Exam Location:  [facility] Procedure: 2D Echo, 3D Echo, Cardiac Doppler, Color Doppler and Strain Analysis Indications:    I44.7 LBBB  History:        Patient has no prior history of Echocardiogram examinations.                 Arrythmias:LBBB; Risk Factors:Hypertension, Dyslipidemia and                 Former Smoker. Pre-operative assessment.  Sonographer:    Quentin Ore RDMS, RVT, RDCS Referring Phys: [id number] [facility]  Sonographer Comments: Patient denied dyspnea IMPRESSIONS  1. Left ventricular ejection fraction, by estimation, is 25 to 30%. Left ventricular ejection fraction by 2D MOD biplane is 30.8 %. The left ventricle has severely decreased function. The left ventricle demonstrates global hypokinesis. The left ventricular internal cavity size was moderately dilated. Left ventricular diastolic parameters are consistent with Grade I diastolic dysfunction (impaired relaxation). The average left ventricular global longitudinal strain is -12.3 %.  2. Right ventricular systolic function is normal. The right ventricular size is normal. Tricuspid regurgitation signal is inadequate for assessing PA pressure.  3. Left atrial size was moderately dilated.  4. The mitral valve is normal in structure. Moderate mitral valve regurgitation. No evidence of mitral stenosis.  5. The aortic valve is  normal in structure. Aortic valve regurgitation is not visualized. No aortic stenosis is present.  6. The inferior vena cava is normal in size with greater than 50% respiratory variability, suggesting right atrial pressure of 3 mmHg. FINDINGS  Left Ventricle: Left ventricular ejection fraction, by estimation, is 25 to 30%. Left ventricular ejection fraction by 2D MOD biplane is 30.8 %. The left ventricle has severely decreased function. The left ventricle demonstrates global hypokinesis. The average left ventricular global longitudinal strain is -12.3 %. The left ventricular internal cavity size was moderately dilated. There is no left ventricular hypertrophy. Left ventricular diastolic parameters are consistent with Grade I diastolic dysfunction (impaired relaxation). Right Ventricle: The right ventricular size is normal. No increase in right ventricular wall thickness. Right ventricular systolic function is normal. Tricuspid regurgitation signal is inadequate for assessing PA pressure. Left Atrium: Left atrial size was moderately dilated. Right Atrium: Right atrial size was normal in size. Pericardium: There is no evidence of pericardial effusion. Mitral Valve: The mitral valve is normal in structure. Moderate mitral valve regurgitation. No evidence of mitral valve stenosis. Tricuspid Valve: The tricuspid valve is normal in structure. Tricuspid valve regurgitation is not demonstrated. No evidence of tricuspid stenosis. Aortic Valve: The aortic valve is normal in structure. Aortic valve regurgitation is not visualized. No aortic stenosis is present. Aortic valve mean gradient measures 6.0 mmHg. Aortic valve peak gradient measures 11.2 mmHg. Aortic valve area, by VTI measures 2.37 cm. Pulmonic Valve: The pulmonic valve was normal in structure. Pulmonic valve regurgitation is not visualized. No evidence of pulmonic stenosis. Aorta: The aortic root is normal in size and structure. Venous: The inferior vena cava is  normal in size with greater than 50% respiratory variability, suggesting right atrial pressure of 3 mmHg. IAS/Shunts: No atrial level shunt detected by color flow  Doppler.  LEFT VENTRICLE PLAX 2D                        Biplane EF (MOD) LVIDd:         5.90 cm         LV Biplane EF:   Left LVIDs:         5.30 cm                          ventricular LV PW:         1.00 cm                          ejection LV IVS:        1.00 cm                          fraction by LVOT diam:     1.90 cm                          2D MOD LV SV:         90                               biplane is LV SV Index:   48                               30.8 %. LVOT Area:     2.84 cm                                Diastology                                LV e' medial:    5.22 cm/s LV Volumes (MOD)               LV E/e' medial:  17.8 LV vol d, MOD    131.0 ml      LV e' lateral:   6.85 cm/s A2C:                           LV E/e' lateral: 13.5 LV vol d, MOD    142.0 ml A4C:                           2D LV vol s, MOD    89.4 ml       Longitudinal A2C:                           Strain LV vol s, MOD    99.8 ml       2D Strain GLS  -12.3 % A4C:                           Avg: LV SV MOD A2C:   41.6 ml LV SV MOD A4C:   142.0 ml LV SV MOD BP:    42.2 ml  3D Volume EF:                                3D EF:        31 %                                LV EDV:       187 ml                                LV ESV:       129 ml                                LV SV:        58 ml RIGHT VENTRICLE             IVC RV Basal diam:  3.70 cm     IVC diam: 0.90 cm RV S prime:     16.00 cm/s TAPSE (M-mode): 2.6 cm LEFT ATRIUM             Index        RIGHT ATRIUM           Index LA diam:        4.00 cm 2.13 cm/m   RA Area:     13.60 cm LA Vol (A2C):   70.9 ml 37.73 ml/m  RA Volume:   33.60 ml  17.88 ml/m LA Vol (A4C):   93.8 ml 49.92 ml/m LA Biplane Vol: 87.7 ml 46.67 ml/m  AORTIC VALVE                     PULMONIC VALVE AV Area (Vmax):     2.43 cm      PV Vmax:       1.25 m/s AV Area (Vmean):   2.30 cm      PV Peak grad:  6.2 mmHg AV Area (VTI):     2.37 cm AV Vmax:           167.00 cm/s AV Vmean:          114.500 cm/s AV VTI:            0.380 m AV Peak Grad:      11.2 mmHg AV Mean Grad:      6.0 mmHg LVOT Vmax:         143.00 cm/s LVOT Vmean:        93.000 cm/s LVOT VTI:          0.318 m LVOT/AV VTI ratio: 0.84  AORTA Ao Root diam: 2.60 cm Ao Asc diam:  2.50 cm Ao Arch diam: 2.1 cm MITRAL VALVE MV Area (PHT): 3.72 cm     SHUNTS MV Decel Time: 204 msec     Systemic VTI:  0.32 m MV E velocity: 92.80 cm/s   Systemic Diam: 1.90 cm MV A velocity: 134.00 cm/s MV E/A ratio:  0.69 Julien Nordmann MD Electronically signed by Julien Nordmann MD Signature Date/Time: 01/23/2023/10:10:01 AM    Final     Microbiology: Results for orders placed or performed during the hospital encounter of 02/08/23  MRSA Next Gen by PCR, Nasal     Status: None   Collection Time: 02/08/23  2:12 PM   Specimen: Nasal Mucosa; Nasal Swab  Result Value Ref Range Status   MRSA by PCR Next Gen NOT DETECTED NOT DETECTED Final    Comment: (NOTE) The GeneXpert MRSA Assay (FDA approved for NASAL specimens only), is one component of a comprehensive MRSA colonization surveillance program. It is not intended to diagnose MRSA infection nor to guide or monitor treatment for MRSA infections. Test performance is not FDA approved in patients less than 42 years old. Performed at [facility], [address] [place], [place] [address]     Labs: CBC: Recent Labs  Lab 02/08/23 1327  WBC 8.4  HGB 11.2*  HCT 35.5*  MCV 86.2  PLT 292   Basic Metabolic Panel: Recent Labs  Lab 02/08/23 1327 02/09/23 0507  NA 140 139  K 3.4* 3.3*  CL 104 101  CO2 25 28  GLUCOSE 107* 100*  BUN 19 23  CREATININE 0.73 0.86  CALCIUM 9.0 8.9  MG 1.7  --    Liver Function Tests: Recent Labs  Lab 02/08/23 1327  AST 20  ALT 21  ALKPHOS 100  BILITOT 0.8  PROT 7.1   ALBUMIN 3.9   CBG: Recent Labs  Lab 02/08/23 1204  GLUCAP 120*    Discharge time spent: greater than 30 minutes.  Signed: Pennie Banter, DO [facility] 02/09/2023

## 2023-02-09 NOTE — Consult Note (Signed)
   Heart Failure Nurse Navigator Note   HFrEF 25-30%.  Global hypokinesis.  Left ventricle demonstrates left ventricular internal cavity is moderately dilated.  Grade 1 diastolic dysfunction.  Left atrium is moderately dilated.  Mild mitral valve regurgitation.  She presented to the emergency room following cardiac catheterization due to shortness of breath.  Blood pressure 210/105 and 177/90.  BNP 727.  Comorbidities:  Left bundle branch block Asthma Arthritis GERD Hypertension  Medications:  Coreg 6.25 mg 2 times a day with meals Farxiga 10 mg daily Furosemide 20 mg tablet daily Crestor 10 mg daily Entresto 24/26 mg 1 tablet 2 times a day Spironolactone 25 mg daily  Labs:  From Feb 08, 2023-sodium 140, potassium 3.4, chloride 104, CO2 25, BUN 19, creatinine 0.73, estimated GFR 11, total cholesterol 190, triglyceride 93, HDL 66, LDL 105. Weight 81 kg Intake 134 mL Output 4450 mL  Initial meeting with patient who is lying in bed in no acute distress in the ICU.    Discussed heart failure and what it means.  Explained heart failure with reduced ejection fraction.  She voices understanding.  Went over the importance of daily weights, what to report, changes in symptoms to report.  Also discussed low-sodium diet and the reasoning behind.  She states that they use a little salt with cooking but that she does not add it at the table.  Explained the rationale for taking salt out of the diet.  Also explained the reasoning behind restriction and no more intake than 64 ounces daily.  She questions that as she states that she drinks a lot of water throughout the day.  Again explained the rationale behind sticking with fluid restriction in relationship to the function of her heart and the medications that she is on.  She voices understanding.  He states that they might eat at a restaurant once every 2 weeks and she usually gets sushi.  She understands being compliant with her medications  and that the GDMT may help with restoring normal function of her left ventricle.  States obtaining her medications is not a problem.  Also discussed transportation.  Patient states that she drives herself to her appointments.  She is active and continues to work part-time in a [employer].  She had no further questions.  Discussed her follow-up with Dr. Shirlee Latch.  He is good with her being seen by him on May 30.  She also has follow-up with cardiology on May 21.  Also made aware that she qualifies for the ventricle health program.  She states that this time she has a lot on her plate and wants to let the dust settle before making a decision.  Was given the living with heart failure teaching booklet, zone magnet, info on low-sodium and heart failure, weight chart.  Tresa Endo RN

## 2023-02-11 ENCOUNTER — Telehealth: Payer: Self-pay | Admitting: *Deleted

## 2023-02-11 DIAGNOSIS — M25551 Pain in right hip: Secondary | ICD-10-CM | POA: Diagnosis not present

## 2023-02-11 DIAGNOSIS — M5459 Other low back pain: Secondary | ICD-10-CM | POA: Diagnosis not present

## 2023-02-11 NOTE — Transitions of Care (Post Inpatient/ED Visit) (Signed)
02/11/2023  Name: Katrina Poole MRN: [id number] DOB: 1945-12-31  Today's TOC FU Call Status: Today's TOC FU Call Status:: Successful TOC FU Call Competed TOC FU Call Complete Date: 02/11/23  Transition Care Management Follow-up Telephone Call Date of Discharge: 02/09/23 Discharge Facility: [facility]) Type of Discharge: Inpatient Admission Primary Inpatient Discharge Diagnosis:: Flash pulmonary edema How have you been since you were released from the hospital?: Better Any questions or concerns?: No  Items Reviewed: Did you receive and understand the discharge instructions provided?: Yes Medications obtained,verified, and reconciled?: Yes (Medications Reviewed) Any new allergies since your discharge?: No Dietary orders reviewed?: No Do you have support at home?: Yes People in Home: spouse Name of Support/Comfort Primary Source: keith  Medications Reviewed Today: Medications Reviewed Today     Reviewed by Luella Cook, RN (Case Manager) on 02/11/23 at 1357  Med List Status: <None>   Medication Order Taking? Sig Documenting Provider Last Dose Status Informant  acetaminophen (TYLENOL) 500 MG tablet [id number] Yes Take 2 tablets (1,000 mg total) by mouth every 6 (six) hours as needed (DO NOT EXCEED 4000 MG DAILY FROM ALL SOURCES). Reubin Milan, MD Taking Active Self  acetaminophen-codeine (TYLENOL #3) 300-30 MG tablet [id number] Yes Take 1-2 tablets by mouth every 6 (six) hours as needed for moderate pain. Bedelia Person, MD Taking Active   albuterol (VENTOLIN HFA) 108 [phone] Base) MCG/ACT inhaler [id number] Yes Inhale 2 puffs into the lungs every 6 (six) hours as needed for wheezing or shortness of breath. Reubin Milan, MD Taking Active Self  alum hydroxide-mag trisilicate (GAVISCON) 80-20 MG CHEW chewable tablet [id number] Yes Chew 1 tablet by mouth daily as needed for indigestion or heartburn.  [provider] Taking Active Self   azelastine (ASTELIN) 0.1 % nasal spray [id number] Yes Place 2 sprays into both nostrils 2 (two) times daily. Use in each nostril as directed Jerrol Banana, MD Taking Active   carvedilol (COREG) 6.25 MG tablet [id number] Yes Take 1 tablet (6.25 mg total) by mouth 2 (two) times daily. Debbe Odea, MD Taking Active   cephALEXin (KEFLEX) 500 MG capsule [id number] Yes TAKE 4 PRIOR TO DENTAL PROCEDURES Reubin Milan, MD Taking Active   cholecalciferol (VITAMIN D3) 25 MCG (1000 UNIT) tablet [id number] Yes Take 1,000 Units by mouth at bedtime. [provider] Taking Active Self  clonazePAM (KLONOPIN) 0.5 MG tablet [id number] Yes Take 1 tablet (0.5 mg total) by mouth at bedtime as needed for anxiety. Reubin Milan, MD Taking Active Self  dapagliflozin propanediol (FARXIGA) 10 MG TABS tablet [id number] Yes Take 1 tablet (10 mg total) by mouth daily. Pennie Banter, DO Taking Active   furosemide (LASIX) 20 MG tablet [id number] Yes Take 1 tablet (20 mg total) by mouth daily. Iran Ouch, MD Taking Active   gabapentin (NEURONTIN) 300 MG capsule [id number] Yes TAKE 1 TO 2 CAPSULES BY MOUTH AT BEDTIME Reubin Milan, MD Taking Active   meclizine (ANTIVERT) 12.5 MG tablet [id number] Yes Take 12.5 mg by mouth 3 (three) times daily as needed for dizziness. Dr. Eduard Clos [provider] Taking Active Self  meloxicam (MOBIC) 7.5 MG tablet [id number] Yes Take 1 tablet (7.5 mg total) by mouth 2 (two) times daily. Reubin Milan, MD Taking Active   Multiple Vitamin (MULTIVITAMIN) tablet [id number] Yes Take 1 tablet by mouth every morning. [provider] Taking Active Self  omeprazole (PRILOSEC) 20 MG capsule [id number] Yes TAKE 1 CAPSULE BY MOUTH TWICE  DAILY BEFORE A MEAL Reubin Milan, MD Taking Active   potassium chloride (KLOR-CON M) 10 MEQ tablet [id number] Yes Take 1 tablet (10 mEq total) by mouth daily. Iran Ouch, MD Taking Active   Probiotic Product (PROBIOTIC  DAILY PO) [id number] Yes Take 1 capsule by mouth daily. [provider] Taking Active Self  promethazine-dextromethorphan (PROMETHAZINE-DM) 6.25-15 MG/5ML syrup [id number] Yes Take 5 mLs by mouth 4 (four) times daily as needed for cough. Jerrol Banana, MD Taking Active   rosuvastatin (CRESTOR) 10 MG tablet [id number] Yes Take 1 tablet (10 mg total) by mouth daily. Pennie Banter, DO Taking Active   sacubitril-valsartan (ENTRESTO) 24-26 MG [id number] Yes Take 1 tablet by mouth 2 (two) times daily. Debbe Odea, MD Taking Active   spironolactone (ALDACTONE) 25 MG tablet [id number] Yes Take 1 tablet (25 mg total) by mouth daily. Pennie Banter, DO Taking Active   traMADol (ULTRAM) 50 MG tablet [id number] Yes Take 50 mg by mouth every 6 (six) hours as needed for moderate pain. [provider] Taking Active Self  zolpidem (AMBIEN) 5 MG tablet [id number] Yes TAKE 1 TABLET BY MOUTH AT BEDTIME Reubin Milan, MD Taking Active             Home Care and Equipment/Supplies: Were Home Health Services Ordered?: NA Any new equipment or medical supplies ordered?: NA  Functional Questionnaire: Do you need assistance with bathing/showering or dressing?: No Do you need assistance with meal preparation?: No Do you need assistance with eating?: No Do you have difficulty maintaining continence: No Do you need assistance with getting out of bed/getting out of a chair/moving?: No Do you have difficulty managing or taking your medications?: No  Follow up appointments reviewed: PCP Follow-up appointment confirmed?: No MD Provider Line [phone] Given: Yes (Rn discussed with pt about getting a follow up appt  with PCP. The reasons why with the hospitalist information on getting a cbc, BMP, MG done. Patient stated she already has an appointment with other Drs.) Specialist Hospital Follow-up appointment confirmed?: Yes Date of Specialist follow-up appointment?:  02/14/23 Follow-Up Specialty Provider:: [id number] Eula Listen 2:45, [id number] Dr Shirlee Latch 10:20 Do you need transportation to your follow-up appointment?: No Do you understand care options if your condition(s) worsen?: Yes-patient verbalized understanding  SDOH Interventions Today    Flowsheet Row Most Recent Value  SDOH Interventions   Food Insecurity Interventions Intervention Not Indicated  Housing Interventions Intervention Not Indicated  Transportation Interventions Intervention Not Indicated      Interventions Today    Flowsheet Row Most Recent Value  General Interventions   General Interventions Discussed/Reviewed General Interventions Discussed, General Interventions Reviewed, Doctor Visits  Doctor Visits Discussed/Reviewed PCP  PCP/Specialist Visits Compliance with follow-up visit  Pharmacy Interventions   Pharmacy Dicussed/Reviewed Pharmacy Topics Discussed, Pharmacy Topics Reviewed      TOC Interventions Today    Flowsheet Row Most Recent Value  TOC Interventions   TOC Interventions Discussed/Reviewed TOC Interventions Discussed, TOC Interventions Reviewed      Gean Maidens BSN RN [facility] [phone]

## 2023-02-18 NOTE — Progress Notes (Signed)
Cardiology Office Note    Date:  02/23/2023   ID:  Palin, Klamm 21-May-1946, MRN [id number]  PCP:  Reubin Milan, MD  Cardiologist:  Debbe Odea, MD  Electrophysiologist:  None   Chief Complaint: Follow-up  History of Present Illness:   Katrina Poole is a 77 y.o. female with history of nonobstructive CAD by LHC in 02/2023, HFrEF secondary to NICM, LBBB, HTN, HLD, lumbar spinal stenosis status post prior surgeries who presents for follow-up of LHC and hospital admission for suspected flash pulmonary edema.  Remote LHC in 2013 at [facility] showed 50% LAD stenosis.  Prior note indicates she reported a cardiac cath at [facility] 6 years ago which showed no CAD with further details being clear.  Zio patch from 2022 demonstrated predominant rhythm of sinus with an average rate of 83 bpm with 5 episodes of SVT lasting up to 14 beats.  Carotid artery ultrasound in 08/2021 showed no evidence of stenosis in the bilateral ICAs with antegrade flow of the bilateral vertebral arteries and normal flow hemodynamics of the bilateral subclavian arteries.  She was seen in 10/2022 for preoperative cardiac risk stratification with preop EKG showing a new left bundle branch block.  She was felt to be acceptable risk for noncardiac surgery.  She subsequently underwent lumbar fusion in 10/2022.  Echo in 01/2023 showed an EF of 25 to 30%, global hypokinesis, grade 1 diastolic dysfunction, normal RV systolic function and ventricular cavity size, moderately dilated left atrium, moderate mitral valve regurgitation, and an estimated right atrial pressure of 3 mmHg.  LHC in 02/2023 showed mild to moderate nonobstructive CAD with 30% proximal LAD stenosis and 40% mid LAD stenosis with severely elevated LVEDP estimated at 35 mmHg.  In recovery, she developed flash pulmonary edema with blood pressure 210/105 (did not take Entresto or carvedilol prior) with frequent PVCs, mild tachycardia, and severe hypoxia.  She was placed  on a nitro drip, BiPAP, and given IV Lasix.  She was subsequently admitted and consulted on by the advanced heart failure service with noted significant improvement in dyspnea following diuresis.  She comes in today accompanied by her husband.  She has not symptoms of angina or cardiac decompensation.  No significant lower extremity swelling or progressive orthopnea.  She is working on lifestyle modification with reducing salt and fluid intake.  Weight has remained stable at home.  When compared to her visit in 10/2022, her weight is down 11 pounds.  She is adherent and tolerating cardiac medications.  She reports home blood pressure readings are in the 130s to 140s systolic with her home cuff.  She has follow-up with advanced heart failure on 03/04/2023.   Labs independently reviewed: 02/2023 - TC 184, TG 81, HDL 61, LDL 107, potassium 3.3, BUN 23, serum creatinine 0.86 A1c 5.4, TSH normal, magnesium 1.7, Hgb 11.2, PLT 292, albumin 3.9, AST/ALT normal  Past Medical History:  Diagnosis Date   Acute urinary retention 02/08/2023   Aortic atherosclerosis (HCC)    Arthritis    osteo of left hip   Asthma    humidity and exercise during summer   GERD (gastroesophageal reflux disease)    Hypertension    controlled on meds   Lumbago of lumbar region with sciatica 06/12/2021   Meniere syndrome    hearing deaf right ear   Neuromuscular disorder (HCC)    finger tips go numb, neck vertebrae   Restless leg syndrome    Spondylolisthesis, lumbosacral region    Vertigo  Wears dentures    full dentures on top, partial bottom    Past Surgical History:  Procedure Laterality Date   ABDOMINAL EXPOSURE N/A 10/09/2022   Procedure: ABDOMINAL EXPOSURE;  Surgeon: Cephus Shelling, MD;  Location: [facility] OR;  Service: Vascular;  Laterality: N/A;   ANTERIOR CERVICAL DECOMP/DISCECTOMY FUSION N/A 03/16/2018   Procedure: Anterior Cervical Discectomy Fusion - Cervical Three- Cervical Four, Cervical Four- Cervical  Five, Cervical Five-Cervical Six, Cervical Six- Cerival Seven;  Surgeon: Donalee Citrin, MD;  Location: [facility] OR;  Service: Neurosurgery;  Laterality: N/A;  Anterior Cervical Discectomy Fusion - Cervical Three- Cervical Four, Cervical Four- Cervical Five, Cervical Five-Cervical Six, Cervical Six- Ceriva   ANTERIOR LUMBAR FUSION N/A 10/09/2022   Procedure: Anteriorr Lumbar Interbody Fusion - Level Five-Sacral One  with posterior cortical screw augmentation and decompressive laminotomy on the right;  Surgeon: Donalee Citrin, MD;  Location: [facility] OR;  Service: Neurosurgery;  Laterality: N/A;   BUNIONECTOMY     Left x1, Right x2.   CARDIAC CATHETERIZATION     x2 both normal   CARPAL TUNNEL RELEASE Right    COLONOSCOPY     COLONOSCOPY WITH PROPOFOL N/A 06/04/2016   Procedure: COLONOSCOPY WITH PROPOFOL;  Surgeon: Christena Deem, MD;  Location: [facility] ENDOSCOPY;  Service: Endoscopy;  Laterality: N/A;   DILATION AND CURETTAGE OF UTERUS     ECTOPIC PREGNANCY SURGERY     ESOPHAGOGASTRODUODENOSCOPY (EGD) WITH PROPOFOL N/A 06/04/2016   Procedure: ESOPHAGOGASTRODUODENOSCOPY (EGD) WITH PROPOFOL;  Surgeon: Christena Deem, MD;  Location: [facility] ENDOSCOPY;  Service: Endoscopy;  Laterality: N/A;   exploratory laparoscopy     infertility surgery   HALLUX VALGUS CORRECTION Bilateral    JOINT REPLACEMENT Bilateral    partial knee   KNEE ARTHROPLASTY Left 01/27/2021   Procedure: CONVERSION OF LEFT PARTIAL KNEE TO LEFT TOTAL KNEE.;  Surgeon: Donato Heinz, MD;  Location: [facility] ORS;  Service: Orthopedics;  Laterality: Left;   KNEE ARTHROSCOPY Left 07/19/2015   Procedure: Arthroscopic limited synovectomy along with lateral compartment and retropatellar chondral debridement plus removal of several small loose bodies;  Surgeon: Erin Sons, MD;  Location: [facility];  Service: Orthopedics;  Laterality: Left;  1ST CASE PER CECE   LAMINECTOMY WITH POSTERIOR LATERAL ARTHRODESIS LEVEL 2 N/A 10/09/2022   Procedure:  LAMINECTOMY WITH POSTERIOR LATERAL ARTHRODESIS LEVEL TWO;  Surgeon: Donalee Citrin, MD;  Location[facility] OR;  Service: Neurosurgery;  Laterality: N/A;   LEFT HEART CATH AND CORONARY ANGIOGRAPHY Left 02/08/2023   Procedure: LEFT HEART CATH AND CORONARY ANGIOGRAPHY;  Surgeon: Iran Ouch, MD;  Location: [facility] INVASIVE CV LAB;  Service: Cardiovascular;  Laterality: Left;   LUMBAR LAMINECTOMY/DECOMPRESSION MICRODISCECTOMY Right 04/29/2020   Procedure: Microdiscectomy - Lumbar Three-Lumbar Four - Lumbar Four-Lumbar Five - right;  Surgeon: Donalee Citrin, MD;  Location: [facility] OR;  Service: Neurosurgery;  Laterality: Right;  Microdiscectomy - Lumbar Three-Lumbar Four - Lumbar Four-Lumbar Five - right   MEDIAL PARTIAL KNEE REPLACEMENT Bilateral    POSTERIOR LUMBAR FUSION  02/2022   L3-4 and L4-5   SHOULDER SURGERY Right    TOTAL HIP ARTHROPLASTY Left 11/25/2015   Procedure: TOTAL HIP ARTHROPLASTY;  Surgeon: Donato Heinz, MD;  Location: [facility] ORS;  Service: Orthopedics;  Laterality: Left;   TRIGGER FINGER RELEASE Left     Current Medications: Current Meds  Medication Sig   acetaminophen (TYLENOL) 500 MG tablet Take 2 tablets (1,000 mg total) by mouth every 6 (six) hours as needed (DO NOT EXCEED 4000 MG DAILY  FROM ALL SOURCES).   acetaminophen-codeine (TYLENOL #3) 300-30 MG tablet Take 1-2 tablets by mouth every 6 (six) hours as needed for moderate pain.   albuterol (VENTOLIN HFA) 108 (90 Base) MCG/ACT inhaler Inhale 2 puffs into the lungs every 6 (six) hours as needed for wheezing or shortness of breath.   alum hydroxide-mag trisilicate (GAVISCON) 80-20 MG CHEW chewable tablet Chew 1 tablet by mouth daily as needed for indigestion or heartburn.    azelastine (ASTELIN) 0.1 % nasal spray Place 2 sprays into both nostrils 2 (two) times daily. Use in each nostril as directed   carvedilol (COREG) 6.25 MG tablet Take 1 tablet (6.25 mg total) by mouth 2 (two) times daily.   cephALEXin (KEFLEX) 500 MG capsule TAKE 4  PRIOR TO DENTAL PROCEDURES   cholecalciferol (VITAMIN D3) 25 MCG (1000 UNIT) tablet Take 1,000 Units by mouth at bedtime.   clonazePAM (KLONOPIN) 0.5 MG tablet Take 1 tablet (0.5 mg total) by mouth at bedtime as needed for anxiety.   dapagliflozin propanediol (FARXIGA) 10 MG TABS tablet Take 1 tablet (10 mg total) by mouth daily.   furosemide (LASIX) 20 MG tablet Take 1 tablet (20 mg total) by mouth daily.   gabapentin (NEURONTIN) 300 MG capsule TAKE 1 TO 2 CAPSULES BY MOUTH AT BEDTIME   meclizine (ANTIVERT) 12.5 MG tablet Take 12.5 mg by mouth 3 (three) times daily as needed for dizziness. Dr. Eduard Clos   meloxicam (MOBIC) 7.5 MG tablet Take 1 tablet (7.5 mg total) by mouth 2 (two) times daily.   Multiple Vitamin (MULTIVITAMIN) tablet Take 1 tablet by mouth every morning.   omeprazole (PRILOSEC) 20 MG capsule TAKE 1 CAPSULE BY MOUTH TWICE DAILY BEFORE A MEAL   potassium chloride (KLOR-CON M) 10 MEQ tablet Take 1 tablet (10 mEq total) by mouth daily.   Probiotic Product (PROBIOTIC DAILY PO) Take 1 capsule by mouth daily.   promethazine-dextromethorphan (PROMETHAZINE-DM) 6.25-15 MG/5ML syrup Take 5 mLs by mouth 4 (four) times daily as needed for cough.   rosuvastatin (CRESTOR) 10 MG tablet Take 1 tablet (10 mg total) by mouth daily.   sacubitril-valsartan (ENTRESTO) 24-26 MG Take 1 tablet by mouth 2 (two) times daily.   spironolactone (ALDACTONE) 25 MG tablet Take 1 tablet (25 mg total) by mouth daily.   traMADol (ULTRAM) 50 MG tablet Take 50 mg by mouth every 6 (six) hours as needed for moderate pain.   zolpidem (AMBIEN) 5 MG tablet TAKE 1 TABLET BY MOUTH AT BEDTIME    Allergies:   Azithromycin, Sulfa antibiotics, Oxycodone, Sulfacetamide, and Beef-derived products   Social History   Socioeconomic History   Marital status: Married    Spouse name: Stann Ore   Number of children: 1   Years of education: Not on file   Highest education level: Not on file  Occupational History    Occupation: Part time  Tobacco Use   Smoking status: Former    Packs/day: 1.50    Years: 30.00    Additional pack years: 0.00    Total pack years: 45.00    Types: Cigarettes    Quit date: 11/12/1990    Years since quitting: 32.3   Smokeless tobacco: Never  Vaping Use   Vaping Use: Never used  Substance and Sexual Activity   Alcohol use: Yes    Alcohol/week: 1.0 standard drink of alcohol    Types: 1 Standard drinks or equivalent per week    Comment: OCC GL[place] OF BRANDY, maybe 1 drink a week  Drug use: No   Sexual activity: Not Currently  Other Topics Concern   Not on file  Social History Narrative   Not on file   Social Determinants of Health   Financial Resource Strain: Low Risk  (10/16/2022)   Overall Financial Resource Strain (CARDIA)    Difficulty of Paying Living Expenses: Not hard at all  Food Insecurity: No Food Insecurity (02/11/2023)   Hunger Vital Sign    Worried About Running Out of Food in the Last Year: Never true    Ran Out of Food in the Last Year: Never true  Transportation Needs: No Transportation Needs (02/11/2023)   PRAPARE - Administrator, Civil Service (Medical): No    Lack of Transportation (Non-Medical): No  Physical Activity: Insufficiently Active (10/16/2022)   Exercise Vital Sign    Days of Exercise per Week: 5 days    Minutes of Exercise per Session: 10 min  Stress: No Stress Concern Present (10/16/2022)   [employer] of Occupational Health - Occupational Stress Questionnaire    Feeling of Stress : Not at all  Social Connections: Moderately Integrated (10/16/2022)   Social Connection and Isolation Panel [NHANES]    Frequency of Communication with Friends and Family: More than three times a week    Frequency of Social Gatherings with Friends and Family: Once a week    Attends Religious Services: Never    Database administrator or Organizations: No    Attends Engineer, structural: More than 4 times per year    Marital  Status: Married     Family History:  The patient's family history includes COPD in her mother; Cancer in her father; Heart attack in her brother and brother; Heart attack (age of onset: 16) in her brother; Heart disease in her brother; Heart failure in her mother; Hypertension in her mother.  ROS:   12-point review of systems is negative unless otherwise noted in the HPI.   EKGs/Labs/Other Studies Reviewed:    Studies reviewed were summarized above. The additional studies were reviewed today:  LHC 02/08/2023:   Prox LAD lesion is 30% stenosed.   Mid LAD lesion is 40% stenosed.   There is moderate to severe left ventricular systolic dysfunction.   LV end diastolic pressure is severely elevated.   The left ventricular ejection fraction is 25-35% by visual estimate.   1.  Mild to moderate nonobstructive coronary artery disease.  The coronary arteries are mildly calcified overall. 2.  Moderately to severely reduced LV systolic function. 3.  Severely elevated left ventricular end-diastolic pressure at 35 mmHg.   Recommendations: Continue medical therapy for nonischemic cardiomyopathy and non-obstructive coronary artery disease. I added small dose furosemide. Follow-up with advanced heart failure as planned. __________  2D echo 01/22/2023: 1. Left ventricular ejection fraction, by estimation, is 25 to 30%. Left  ventricular ejection fraction by 2D MOD biplane is 30.8 %. The left  ventricle has severely decreased function. The left ventricle demonstrates  global hypokinesis. The left  ventricular internal cavity size was moderately dilated. Left ventricular  diastolic parameters are consistent with Grade I diastolic dysfunction  (impaired relaxation). The average left ventricular global longitudinal  strain is -12.3 %.   2. Right ventricular systolic function is normal. The right ventricular  size is normal. Tricuspid regurgitation signal is inadequate for assessing  PA pressure.    3. Left atrial size was moderately dilated.   4. The mitral valve is normal in structure. Moderate mitral valve  regurgitation. No evidence of mitral stenosis.   5. The aortic valve is normal in structure. Aortic valve regurgitation is  not visualized. No aortic stenosis is present.   6. The inferior vena cava is normal in size with greater than 50%  respiratory variability, suggesting right atrial pressure of 3 mmHg.  __________  Luci Bank patch 07/2021: Patient had a min HR of 57 bpm, max HR of 174 bpm, and avg HR of 83 bpm. Predominant underlying rhythm was Sinus Rhythm.  Occasional paroxysmal supraventricular Tachycardia runs occurred,  Isolated SVEs were rare (<1.0%), SVE Couplets were rare (<1.0%), and SVE Triplets were rare (<1.0%). Isolated VEs were rare (<1.0%, 827). Benign cardiac monitor, no significant arrhythmias. __________  Carotid artery ultrasound 07/18/2021: Summary:  Right Carotid: There is no evidence of stenosis in the right ICA. The extracranial vessels were near-normal with only minimal wall thickening or plaque.   Left Carotid: There is no evidence of stenosis in the left ICA. The  extracranial vessels were near-normal with only minimal wall thickening  or plaque.   Vertebrals:  Bilateral vertebral arteries demonstrate antegrade flow.  Subclavians: Normal flow hemodynamics were seen in bilateral subclavian arteries.    EKG:  EKG is ordered today.  The EKG ordered today demonstrates NSR, 65 bpm, LBBB, rare PVC  Recent Labs: 02/08/2023: ALT 21; B Natriuretic Peptide 727.1; Hemoglobin 11.2; Magnesium 1.7; Platelets 292; TSH 1.615 02/09/2023: BUN 23; Creatinine, Ser 0.86; Potassium 3.3; Sodium 139  Recent Lipid Panel    Component Value Date/Time   CHOL 184 02/09/2023 0507   CHOL 211 (H) 10/23/2021 0944   TRIG 81 02/09/2023 0507   HDL 61 02/09/2023 0507   HDL 70 10/23/2021 0944   CHOLHDL 3.0 02/09/2023 0507   VLDL 16 02/09/2023 0507   LDLCALC 107 (H) 02/09/2023 0507    LDLCALC 123 (H) 10/23/2021 0944    PHYSICAL EXAM:    VS:  BP 103/64 (BP Location: Left Arm, Patient Position: Sitting, Cuff Size: Large)   Pulse 65   Ht 5\' 2"  (1.575 m)   Wt 199 lb (90.3 kg)   SpO2 96%   BMI 36.40 kg/m   BMI: Body mass index is 36.4 kg/m.  Physical Exam Vitals reviewed.  Constitutional:      Appearance: She is well-developed.  HENT:     Head: Normocephalic and atraumatic.  Eyes:     General:        Right eye: No discharge.        Left eye: No discharge.  Neck:     Vascular: No JVD.  Cardiovascular:     Rate and Rhythm: Normal rate and regular rhythm.     Heart sounds: Normal heart sounds, S1 normal and S2 normal. Heart sounds not distant. No midsystolic click and no opening snap. No murmur heard.    No friction rub.  Pulmonary:     Effort: Pulmonary effort is normal. No respiratory distress.     Breath sounds: Normal breath sounds. No decreased breath sounds, wheezing or rales.  Chest:     Chest wall: No tenderness.  Abdominal:     General: There is no distension.  Musculoskeletal:     Cervical back: Normal range of motion.     Right lower leg: No edema.     Left lower leg: No edema.  Skin:    General: Skin is warm and dry.     Nails: There is no clubbing.  Neurological:     Mental Status: She is alert  and oriented to person, place, and time.  Psychiatric:        Speech: Speech normal.        Behavior: Behavior normal.        Thought Content: Thought content normal.        Judgment: Judgment normal.     Wt Readings from Last 3 Encounters:  02/23/23 199 lb (90.3 kg)  02/09/23 178 lb 9.2 oz (81 kg)  11/18/22 209 lb (94.8 kg)     ASSESSMENT & PLAN:   Nonobstructive CAD: No symptoms suggestive of angina.  LHC earlier this month showed nonobstructive CAD.  Continue aggressive risk factor modification and primary prevention including current medical therapy.  No indication for further ischemic testing at this time.  HFrEF secondary to  NICM: Euvolemic and well compensated.  Continue current GDMT including Entresto 24/26 mg twice daily, carvedilol 6.25 mg twice daily, Farxiga 10 mg daily, spironolactone 25 mg daily, and Lasix 20 mg daily with KCl repletion 10 mEq daily.  Relative hypotension and heart rate precludes further escalation of GDMT at this time.  Check BMP today.  Schedule cardiac MRI to evaluate for infiltrative process/myocarditis.  CHF education.  Daily weights.  LBBB: No symptoms of presyncope or syncope.  Stable with QRS 130 ms.  Not wide enough to have significant benefit from CRT.  HTN: Blood pressure is well-controlled in the office.  Continue medical therapy as outlined above.  HLD: LDL 107 with goal being less than 70.  Now on rosuvastatin.  Follow-up fasting lipid panel in 2 months.   Disposition: F/u with Dr. Azucena Cecil in 2 months and advanced heart failure as scheduled later this month.   Medication Adjustments/Labs and Tests Ordered: Current medicines are reviewed at length with the patient today.  Concerns regarding medicines are outlined above. Medication changes, Labs and Tests ordered today are summarized above and listed in the Patient Instructions accessible in Encounters.   Signed, Eula Listen, PA-C 02/23/2023 3:53 PM     [facility] [address] [place], [place] [address] [phone]

## 2023-02-23 ENCOUNTER — Other Ambulatory Visit
Admission: RE | Admit: 2023-02-23 | Discharge: 2023-02-23 | Disposition: A | Discharge status: Home / Self Care | Payer: Medicare Other | Attending: Physician Assistant | Admitting: Physician Assistant

## 2023-02-23 ENCOUNTER — Encounter: Payer: Self-pay | Admitting: Physician Assistant

## 2023-02-23 ENCOUNTER — Ambulatory Visit: Payer: Medicare Other | Attending: Physician Assistant | Admitting: Physician Assistant

## 2023-02-23 VITALS — BP 103/64 | HR 65 | Ht 62.0 in | Wt 199.0 lb

## 2023-02-23 DIAGNOSIS — I10 Essential (primary) hypertension: Secondary | ICD-10-CM | POA: Diagnosis not present

## 2023-02-23 DIAGNOSIS — I428 Other cardiomyopathies: Secondary | ICD-10-CM

## 2023-02-23 DIAGNOSIS — I2510 Atherosclerotic heart disease of native coronary artery without angina pectoris: Secondary | ICD-10-CM | POA: Diagnosis not present

## 2023-02-23 DIAGNOSIS — I447 Left bundle-branch block, unspecified: Secondary | ICD-10-CM

## 2023-02-23 DIAGNOSIS — I5020 Unspecified systolic (congestive) heart failure: Secondary | ICD-10-CM

## 2023-02-23 DIAGNOSIS — E785 Hyperlipidemia, unspecified: Secondary | ICD-10-CM

## 2023-02-23 LAB — BASIC METABOLIC PANEL
Anion gap: 12 (ref 5–15)
BUN: 39 mg/dL — ABNORMAL HIGH (ref 8–23)
CO2: 21 mmol/L — ABNORMAL LOW (ref 22–32)
Calcium: 9.3 mg/dL (ref 8.9–10.3)
Chloride: 101 mmol/L (ref 98–111)
Creatinine, Ser: 1.2 mg/dL — ABNORMAL HIGH (ref 0.44–1.00)
GFR, Estimated: 47 mL/min — ABNORMAL LOW (ref >60)
Glucose, Bld: 94 mg/dL (ref 70–99)
Potassium: 4.2 mmol/L (ref 3.5–5.1)
Sodium: 134 mmol/L — ABNORMAL LOW (ref 135–145)

## 2023-02-23 LAB — HEMOGLOBIN AND HEMATOCRIT, BLOOD
HCT: 34 % — ABNORMAL LOW (ref 36.0–46.0)
Hemoglobin: 11.2 g/dL — ABNORMAL LOW (ref 12.0–15.0)

## 2023-02-23 NOTE — Patient Instructions (Signed)
Medication Instructions:  No changes at this time.   *If you need a refill on your cardiac medications before your next appointment, please call your pharmacy*   Lab Work: BMET today over at [facility] and check in at registration desk.   If you have labs (blood work) drawn today and your tests are completely normal, you will receive your results only by: MyChart Message (if you have MyChart) OR A paper copy in the mail If you have any lab test that is abnormal or we need to change your treatment, we will call you to review the results.   Testing/Procedures:   You are scheduled for Cardiac MRI on ______________. Please arrive for your appointment at ______________ ( arrive 30-45 minutes prior to test start time). ?  [facility] [address] [place], [place] [address] [phone] Please take advantage of the free valet parking available at the MAIN entrance (A entrance).  Proceed to the [facility] Radiology Department (First Floor) for check-in.   OR   [facility] [address] [place], [place] [address] [phone] Please take advantage of the free valet parking available at the MAIN entrance. Proceed to [facility] registration for check-in (first floor).  Magnetic resonance imaging (MRI) is a painless test that produces images of the inside of the body without using Xrays.  During an MRI, strong magnets and radio waves work together in a Data processing manager to form detailed images.   MRI images may provide more details about a medical condition than X-rays, CT scans, and ultrasounds can provide.  You may be given earphones to listen for instructions.  You may eat a light breakfast and take medications as ordered with the exception of furosemide, hydrochlorothiazide, or spironolactone(fluid pill, other). Please avoid stimulants for 12 hr prior to test. (Ie. Caffeine, nicotine, chocolate, or antihistamine medications)  If a contrast  material will be used, an IV will be inserted into one of your veins. Contrast material will be injected into your IV. It will leave your body through your urine within a day. You may be told to drink plenty of fluids to help flush the contrast material out of your system.  You will be asked to remove all metal, including: Watch, jewelry, and other metal objects including hearing aids, hair pieces and dentures. Also wearable glucose monitoring systems (ie. Freestyle Libre and Omnipods) (Braces and fillings normally are not a problem.)   TEST WILL TAKE APPROXIMATELY 1 HOUR  PLEASE NOTIFY SCHEDULING AT LEAST 24 HOURS IN ADVANCE IF YOU ARE UNABLE TO KEEP YOUR APPOINTMENT. [phone]  Please call Rockwell Alexandria, cardiac imaging nurse navigator with any questions/concerns. Rockwell Alexandria RN Navigator Cardiac Imaging Larey Brick RN Navigator Cardiac Imaging [facility] [phone] Office     Follow-Up: At [facility], you and your health needs are our priority.  As part of our continuing mission to provide you with exceptional heart care, we have created designated Provider Care Teams.  These Care Teams include your primary Cardiologist (physician) and Advanced Practice Providers (APPs -  Physician Assistants and Nurse Practitioners) who all work together to provide you with the care you need, when you need it.   Your next appointment:   2 month(s)  Provider:   Debbe Odea, MD ONLY

## 2023-02-24 ENCOUNTER — Other Ambulatory Visit: Payer: Self-pay

## 2023-02-24 DIAGNOSIS — Z79899 Other long term (current) drug therapy: Secondary | ICD-10-CM

## 2023-02-24 MED ORDER — FUROSEMIDE 20 MG PO TABS: 20.0000 mg | ORAL_TABLET | Freq: Every day | ORAL | 3 refills | Status: DC | PRN

## 2023-02-24 MED ORDER — POTASSIUM CHLORIDE CRYS ER 10 MEQ PO TBCR: 10.0000 meq | EXTENDED_RELEASE_TABLET | ORAL | 3 refills | Status: DC | PRN

## 2023-03-04 ENCOUNTER — Telehealth (HOSPITAL_COMMUNITY): Payer: Self-pay

## 2023-03-04 ENCOUNTER — Ambulatory Visit (HOSPITAL_BASED_OUTPATIENT_CLINIC_OR_DEPARTMENT_OTHER): Payer: Medicare Other | Admitting: Cardiology

## 2023-03-04 ENCOUNTER — Other Ambulatory Visit (HOSPITAL_COMMUNITY): Payer: Self-pay

## 2023-03-04 ENCOUNTER — Encounter: Payer: Self-pay | Admitting: Cardiology

## 2023-03-04 ENCOUNTER — Other Ambulatory Visit
Admission: RE | Admit: 2023-03-04 | Discharge: 2023-03-04 | Disposition: A | Discharge status: Home / Self Care | Payer: Medicare Other | Source: Ambulatory Visit | Attending: Cardiology | Admitting: Cardiology

## 2023-03-04 VITALS — BP 128/70 | HR 64 | Wt 198.0 lb

## 2023-03-04 DIAGNOSIS — I5020 Unspecified systolic (congestive) heart failure: Secondary | ICD-10-CM

## 2023-03-04 DIAGNOSIS — I5042 Chronic combined systolic (congestive) and diastolic (congestive) heart failure: Secondary | ICD-10-CM

## 2023-03-04 LAB — BASIC METABOLIC PANEL
Anion gap: 8 (ref 5–15)
BUN: 22 mg/dL (ref 8–23)
CO2: 25 mmol/L (ref 22–32)
Calcium: 9.3 mg/dL (ref 8.9–10.3)
Chloride: 101 mmol/L (ref 98–111)
Creatinine, Ser: 1.12 mg/dL — ABNORMAL HIGH (ref 0.44–1.00)
GFR, Estimated: 51 mL/min — ABNORMAL LOW (ref >60)
Glucose, Bld: 101 mg/dL — ABNORMAL HIGH (ref 70–99)
Potassium: 4.8 mmol/L (ref 3.5–5.1)
Sodium: 134 mmol/L — ABNORMAL LOW (ref 135–145)

## 2023-03-04 LAB — HEMOGLOBIN AND HEMATOCRIT, BLOOD
HCT: 33.5 % — ABNORMAL LOW (ref 36.0–46.0)
Hemoglobin: 10.8 g/dL — ABNORMAL LOW (ref 12.0–15.0)

## 2023-03-04 LAB — BRAIN NATRIURETIC PEPTIDE: B Natriuretic Peptide: 193.5 pg/mL — ABNORMAL HIGH (ref 0.0–100.0)

## 2023-03-04 MED ORDER — ROSUVASTATIN CALCIUM 10 MG PO TABS: 20.0000 mg | ORAL_TABLET | Freq: Every day | ORAL | 2 refills | Status: DC

## 2023-03-04 MED ORDER — ENTRESTO 49-51 MG PO TABS: 1.0000 | ORAL_TABLET | Freq: Two times a day (BID) | ORAL | 0 refills | Status: DC

## 2023-03-04 NOTE — Telephone Encounter (Signed)
Advanced Heart Failure Patient Advocate Encounter  The patient was approved for a [employer] grant that will help cover the cost of Carvedilol, Entresto, Farxiga, Spironolactone.  Total amount awarded, $10,000.  Effective: 02/02/2023 - 02/01/2024.  BIN [id number] PCN [id number] [id number] ID [id number]  Informed patient by phone; provided approval and processing information via [employer].  Burnell Blanks, CPhT Rx Patient Advocate Phone: [phone]

## 2023-03-04 NOTE — Progress Notes (Signed)
PCP: Reubin Milan, MD Cardiology: Dr. Azucena Cecil HF Cardiology: Dr. Shirlee Latch  77 y.o.with history of HTN and osteoarthritis was found to have a cardiomyopathy in 4/24.  Patient reports 2 prior cardiac caths several years ago, one at [facility] and one at [facility].  Neither showed obstructive CAD.  She does have a strong family history of CAD (multiple siblings with MIs/CAD).  She never had echoes with prior workups per her report.  Patient was found to have a LBBB, so echo was ordered by Dr. Azucena Cecil.  Echo was done in 4/24 and showed EF 25-30%, moderate LV dilation, normal RV, and moderate MR.  Due to low EF, she was set up for LHC that was done in 5/24.  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  Patient was short of breath and was put on Bipap.  She was markedly hypertensive.  She was admitted overnight and diuresed.    Patient returns for followup of CHF. She is doing reasonably well.  BP is now controlled.  She is not currently taking Lasix, uses prn.  She is short of breath walking up a hill but not short of breath walking on flat ground.  No lightheadedness. No chest pain.  No orthopnea/PND.    ECG (4/24, personally reviewed): NSR, LBBB 136 msec  Labs (5/24): K 4.2, creatinine 1.2, LDL 107  PMH: 1. LBBB 2. CAD: LHC ([facility] in 2013) with 50% LAD stenosis.  - LHC (5/24): 30% pLAD, 40% mLAD.  3. Chronic systolic CHF: Nonischemic cardiomyopathy.  - Echo (4/24): EF 25-30%, normal RV, moderate MR.  4. HTN 5. Meniere's disease 6. Lumbar spinal fusion 1/24 7. OA  Social History   Socioeconomic History   Marital status: Married    Spouse name: Stann Ore   Number of children: 1   Years of education: Not on file   Highest education level: Not on file  Occupational History   Occupation: Part time  Tobacco Use   Smoking status: Former    Packs/day: 1.50    Years: 30.00    Additional pack years: 0.00    Total pack years: 45.00    Types:  Cigarettes    Quit date: 11/12/1990    Years since quitting: 32.3   Smokeless tobacco: Never  Vaping Use   Vaping Use: Never used  Substance and Sexual Activity   Alcohol use: Yes    Alcohol/week: 1.0 standard drink of alcohol    Types: 1 Standard drinks or equivalent per week    Comment: OCC [place], maybe 1 drink a week   Drug use: No   Sexual activity: Not Currently  Other Topics Concern   Not on file  Social History Narrative   Not on file   Social Determinants of Health   Financial Resource Strain: Low Risk  (10/16/2022)   Overall Financial Resource Strain (CARDIA)    Difficulty of Paying Living Expenses: Not hard at all  Food Insecurity: No Food Insecurity (02/11/2023)   Hunger Vital Sign    Worried About Running Out of Food in the Last Year: Never true    Ran Out of Food in the Last Year: Never true  Transportation Needs: No Transportation Needs (02/11/2023)   PRAPARE - Administrator, Civil Service (Medical): No    Lack of Transportation (Non-Medical): No  Physical Activity: Insufficiently Active (10/16/2022)   Exercise Vital Sign    Days of Exercise per Week: 5 days  Minutes of Exercise per Session: 10 min  Stress: No Stress Concern Present (10/16/2022)   [employer] of Occupational Health - Occupational Stress Questionnaire    Feeling of Stress : Not at all  Social Connections: Moderately Integrated (10/16/2022)   Social Connection and Isolation Panel [NHANES]    Frequency of Communication with Friends and Family: More than three times a week    Frequency of Social Gatherings with Friends and Family: Once a week    Attends Religious Services: Never    Database administrator or Organizations: No    Attends Engineer, structural: More than 4 times per year    Marital Status: Married  Catering manager Violence: Not At Risk (10/16/2022)   Humiliation, Afraid, Rape, and Kick questionnaire    Fear of Current or Ex-Partner: No    Emotionally  Abused: No    Physically Abused: No    Sexually Abused: No   FH: Brother died at 95 of MI, brother died at 58 of MI, brother died in 46s of CVA, mother with CHF.   ROS: All systems reviewed and negative except as per HPI.   Current Outpatient Medications  Medication Sig Dispense Refill   acetaminophen (TYLENOL) 500 MG tablet Take 2 tablets (1,000 mg total) by mouth every 6 (six) hours as needed (DO NOT EXCEED 4000 MG DAILY FROM ALL SOURCES). 30 tablet 2   albuterol (VENTOLIN HFA) 108 (90 Base) MCG/ACT inhaler Inhale 2 puffs into the lungs every 6 (six) hours as needed for wheezing or shortness of breath. 18 g 1   alum hydroxide-mag trisilicate (GAVISCON) 80-20 MG CHEW chewable tablet Chew 1 tablet by mouth daily as needed for indigestion or heartburn.      carvedilol (COREG) 6.25 MG tablet Take 1 tablet (6.25 mg total) by mouth 2 (two) times daily. 180 tablet 3   cephALEXin (KEFLEX) 500 MG capsule TAKE 4 PRIOR TO DENTAL PROCEDURES 4 capsule 1   cholecalciferol (VITAMIN D3) 25 MCG (1000 UNIT) tablet Take 1,000 Units by mouth at bedtime.     clonazePAM (KLONOPIN) 0.5 MG tablet Take 1 tablet (0.5 mg total) by mouth at bedtime as needed for anxiety. 30 tablet 0   gabapentin (NEURONTIN) 300 MG capsule TAKE 1 TO 2 CAPSULES BY MOUTH AT BEDTIME 180 capsule 1   meclizine (ANTIVERT) 12.5 MG tablet Take 12.5 mg by mouth 3 (three) times daily as needed for dizziness. Dr. Eduard Clos     meloxicam (MOBIC) 7.5 MG tablet Take 1 tablet (7.5 mg total) by mouth 2 (two) times daily. 180 tablet 0   Multiple Vitamin (MULTIVITAMIN) tablet Take 1 tablet by mouth every morning.     omeprazole (PRILOSEC) 20 MG capsule TAKE 1 CAPSULE BY MOUTH TWICE DAILY BEFORE A MEAL 180 capsule 1   Probiotic Product (PROBIOTIC DAILY PO) Take 1 capsule by mouth daily.     sacubitril-valsartan (ENTRESTO) 24-26 MG Take 1 tablet by mouth 2 (two) times daily. 60 tablet 3   sacubitril-valsartan (ENTRESTO) 49-51 MG Take 1 tablet by mouth 2 (two)  times daily. 60 tablet 0   spironolactone (ALDACTONE) 25 MG tablet Take 1 tablet (25 mg total) by mouth daily. 30 tablet 2   traMADol (ULTRAM) 50 MG tablet Take 50 mg by mouth every 6 (six) hours as needed for moderate pain.     zolpidem (AMBIEN) 5 MG tablet TAKE 1 TABLET BY MOUTH AT BEDTIME 30 tablet 5   acetaminophen-codeine (TYLENOL #3) 300-30 MG tablet Take 1-2 tablets by  mouth every 6 (six) hours as needed for moderate pain. (Patient not taking: Reported on 03/04/2023) 50 tablet 0   azelastine (ASTELIN) 0.1 % nasal spray Place 2 sprays into both nostrils 2 (two) times daily. Use in each nostril as directed (Patient not taking: Reported on 03/04/2023) 30 mL 0   dapagliflozin propanediol (FARXIGA) 10 MG TABS tablet Take 1 tablet (10 mg total) by mouth daily. (Patient not taking: Reported on 03/04/2023) 60 tablet 1   furosemide (LASIX) 20 MG tablet Take 1 tablet (20 mg total) by mouth daily as needed (swelling, shortness of breath, gain 3 lbs overnight or 5 lbs in a week). (Patient not taking: Reported on 03/04/2023) 30 tablet 3   potassium chloride (KLOR-CON M) 10 MEQ tablet Take 1 tablet (10 mEq total) by mouth as needed (with lasix). (Patient not taking: Reported on 03/04/2023) 30 tablet 3   promethazine-dextromethorphan (PROMETHAZINE-DM) 6.25-15 MG/5ML syrup Take 5 mLs by mouth 4 (four) times daily as needed for cough. (Patient not taking: Reported on 03/04/2023) 118 mL 0   rosuvastatin (CRESTOR) 10 MG tablet Take 2 tablets (20 mg total) by mouth daily. 30 tablet 2   No current facility-administered medications for this visit.   BP 128/70 (BP Location: Right Arm, Patient Position: Sitting)   Pulse 64   Wt 198 lb (89.8 kg)   SpO2 97%   BMI 36.21 kg/m  General: NAD Neck: No JVD, no thyromegaly or thyroid nodule.  Lungs: Clear to auscultation bilaterally with normal respiratory effort. CV: Nondisplaced PMI.  Heart regular S1/S2, no S3/S4, no murmur.  No peripheral edema.  No carotid bruit.   Normal pedal pulses.  Abdomen: Soft, nontender, no hepatosplenomegaly, no distention.  Skin: Intact without lesions or rashes.  Neurologic: Alert and oriented x 3.  Psych: Normal affect. Extremities: No clubbing or cyanosis.  HEENT: Normal.   Assessment/Plan: 1. Chronic systolic CHF: Nonischemic cardiomyopathy.  No heavy ETOH/drugs.  No FH of dilated cardiomyopathy (has FH of CAD).  Patient was found to have LBBB, leading to echo in 4/24 showing EF 25-30%, moderate LV dilation, normal RV, and moderate MR.  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  Currently, not volume overloaded with NYHA class II symptoms.   - Keep Lasix prn.  - Increase Entresto to 49/51 bid, BMET/BNP today and BMET in 10 days.  - Continue spironolactone 25 mg daily.  - Continue Farxiga 10 mg daily.  - Continue Coreg 6.25 mg bid.  - She has LBBB 134 msec on last ECG, probably not wide enough to have significant benefit from CRT.  - I will arrange for cardiac MRI to assess for infiltrative disease, myocarditis.  2. CAD: Nonobstructive.  Strong FH of MI.  - Goal LDL < 55, increase Crestor to 20 mg daily. Lipids/LFTs in 2 months.  3. HTN: BP controlled.   Followup 6 wks with me.    Marca Ancona 03/04/2023

## 2023-03-04 NOTE — Patient Instructions (Signed)
Medication Changes:  INCREASE Crestor to 20 mg DAILY   INCREASE Entresto to 49/51 mg (1 tablet) twice a day   *If you need a refill on your cardiac medications before your next appointment, please call your pharmacy*  Lab Work:  Labs done today, your results will be available in MyChart, we will contact you for abnormal readings.  Get repeat blood work done in 10 days in [place], [place]   Follow-Up in:   Your physician recommends that you schedule a follow-up appointment in: 6 weeks with Dr. Shirlee Latch.   Do the following things EVERYDAY: Weigh yourself in the morning before breakfast. Write it down and keep it in a log. Take your medicines as prescribed Eat low salt foods--Limit salt (sodium) to 2000 mg per day.  Stay as active as you can everyday Limit all fluids for the day to less than 2 liters    Need to Contact us:  If you have any questions or concerns before your next appointment please send [place] a message through [place] or call our office at [phone]

## 2023-03-14 ENCOUNTER — Other Ambulatory Visit: Payer: Self-pay | Admitting: Internal Medicine

## 2023-03-14 ENCOUNTER — Encounter: Payer: Self-pay | Admitting: Internal Medicine

## 2023-03-14 DIAGNOSIS — M1712 Unilateral primary osteoarthritis, left knee: Secondary | ICD-10-CM

## 2023-03-15 ENCOUNTER — Telehealth: Payer: Self-pay

## 2023-03-15 DIAGNOSIS — I5042 Chronic combined systolic (congestive) and diastolic (congestive) heart failure: Secondary | ICD-10-CM

## 2023-03-15 NOTE — Telephone Encounter (Signed)
Patient called today stating that [facility] never received lab orders. Lab order was placed.

## 2023-03-16 ENCOUNTER — Other Ambulatory Visit: Payer: Self-pay

## 2023-03-16 DIAGNOSIS — Z79899 Other long term (current) drug therapy: Secondary | ICD-10-CM

## 2023-03-16 DIAGNOSIS — I5042 Chronic combined systolic (congestive) and diastolic (congestive) heart failure: Secondary | ICD-10-CM | POA: Diagnosis not present

## 2023-03-17 LAB — BASIC METABOLIC PANEL
BUN/Creatinine Ratio: 24 (ref 12–28)
BUN: 31 mg/dL — ABNORMAL HIGH (ref 8–27)
CO2: 24 mmol/L (ref 20–29)
Calcium: 9.5 mg/dL (ref 8.7–10.3)
Chloride: 96 mmol/L (ref 96–106)
Creatinine, Ser: 1.31 mg/dL — ABNORMAL HIGH (ref 0.57–1.00)
Glucose: 92 mg/dL (ref 70–99)
Potassium: 4.8 mmol/L (ref 3.5–5.2)
Sodium: 132 mmol/L — ABNORMAL LOW (ref 134–144)
eGFR: 42 mL/min/{1.73_m2} — ABNORMAL LOW (ref >59)

## 2023-03-18 LAB — BRAIN NATRIURETIC PEPTIDE: BNP: 75.4 pg/mL (ref 0.0–100.0)

## 2023-03-23 ENCOUNTER — Other Ambulatory Visit: Payer: Self-pay | Admitting: Internal Medicine

## 2023-03-23 ENCOUNTER — Encounter: Payer: Self-pay | Admitting: Internal Medicine

## 2023-03-23 DIAGNOSIS — F411 Generalized anxiety disorder: Secondary | ICD-10-CM

## 2023-03-23 MED ORDER — CLONAZEPAM 0.5 MG PO TABS
0.5000 mg | ORAL_TABLET | Freq: Every evening | ORAL | 0 refills | Status: DC | PRN
Start: 2023-03-23 — End: 2023-10-12

## 2023-03-23 NOTE — Telephone Encounter (Signed)
Please review.  KP

## 2023-04-06 ENCOUNTER — Telehealth: Payer: Self-pay

## 2023-04-06 ENCOUNTER — Other Ambulatory Visit (HOSPITAL_COMMUNITY): Payer: Self-pay

## 2023-04-06 DIAGNOSIS — I5042 Chronic combined systolic (congestive) and diastolic (congestive) heart failure: Secondary | ICD-10-CM

## 2023-04-06 DIAGNOSIS — I2510 Atherosclerotic heart disease of native coronary artery without angina pectoris: Secondary | ICD-10-CM

## 2023-04-06 MED ORDER — ROSUVASTATIN CALCIUM 20 MG PO TABS
20.0000 mg | ORAL_TABLET | Freq: Every day | ORAL | 5 refills | Status: DC
Start: 2023-04-06 — End: 2023-09-24

## 2023-04-08 ENCOUNTER — Other Ambulatory Visit: Payer: Self-pay | Admitting: Internal Medicine

## 2023-04-08 DIAGNOSIS — G2581 Restless legs syndrome: Secondary | ICD-10-CM

## 2023-04-09 NOTE — Telephone Encounter (Signed)
Requested Prescriptions  Pending Prescriptions Disp Refills   gabapentin (NEURONTIN) 300 MG capsule [Pharmacy Med Name: Gabapentin 300 MG Oral Capsule] 180 capsule 0    Sig: TAKE 1 TO 2 CAPSULES BY MOUTH AT BEDTIME     Neurology: Anticonvulsants - gabapentin Failed - 04/08/2023  9:20 AM      Failed - Cr in normal range and within 360 days    Creatinine, Ser  Date Value Ref Range Status  03/16/2023 1.31 (H) 0.57 - 1.00 mg/dL Final         Passed - Completed PHQ-2 or PHQ-9 in the last 360 days      Passed - Valid encounter within last 12 months    Recent Outpatient Visits           2 months ago Allergic pharyngitis   [facility], Ocie Bob, MD   4 months ago Essential hypertension   [place] at [facility], Nyoka Cowden, MD   11 months ago Prediabetes   [facility] at [facility], Nyoka Cowden, MD   1 year ago Annual physical exam   [facility] at [facility], Nyoka Cowden, MD   1 year ago Essential hypertension   [facility] at [facility], Nyoka Cowden, MD       Future Appointments             In 4 days Judithann Graves, Nyoka Cowden, MD [facility] at [facility], [facility]   In 3 weeks Debbe Odea, MD [facility] at [facility]

## 2023-04-10 ENCOUNTER — Other Ambulatory Visit: Payer: Self-pay | Admitting: Cardiology

## 2023-04-13 ENCOUNTER — Encounter: Payer: Medicare Other | Admitting: Cardiology

## 2023-04-13 ENCOUNTER — Ambulatory Visit (INDEPENDENT_AMBULATORY_CARE_PROVIDER_SITE_OTHER): Payer: Medicare Other | Admitting: Internal Medicine

## 2023-04-13 ENCOUNTER — Encounter (HOSPITAL_COMMUNITY): Payer: Self-pay

## 2023-04-13 ENCOUNTER — Encounter: Payer: Self-pay | Admitting: Internal Medicine

## 2023-04-13 ENCOUNTER — Telehealth (HOSPITAL_COMMUNITY): Payer: Self-pay | Admitting: Emergency Medicine

## 2023-04-13 VITALS — BP 118/70 | HR 60 | Ht 62.0 in | Wt 194.0 lb

## 2023-04-13 DIAGNOSIS — Z0000 Encounter for general adult medical examination without abnormal findings: Secondary | ICD-10-CM

## 2023-04-13 DIAGNOSIS — E782 Mixed hyperlipidemia: Secondary | ICD-10-CM | POA: Diagnosis not present

## 2023-04-13 DIAGNOSIS — Z23 Encounter for immunization: Secondary | ICD-10-CM | POA: Diagnosis not present

## 2023-04-13 DIAGNOSIS — G2581 Restless legs syndrome: Secondary | ICD-10-CM | POA: Diagnosis not present

## 2023-04-13 DIAGNOSIS — I5022 Chronic systolic (congestive) heart failure: Secondary | ICD-10-CM | POA: Diagnosis not present

## 2023-04-13 DIAGNOSIS — R7303 Prediabetes: Secondary | ICD-10-CM

## 2023-04-13 DIAGNOSIS — F411 Generalized anxiety disorder: Secondary | ICD-10-CM

## 2023-04-13 DIAGNOSIS — I10 Essential (primary) hypertension: Secondary | ICD-10-CM | POA: Diagnosis not present

## 2023-04-13 DIAGNOSIS — Z6841 Body Mass Index (BMI) 40.0 and over, adult: Secondary | ICD-10-CM | POA: Diagnosis not present

## 2023-04-13 NOTE — Assessment & Plan Note (Signed)
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

## 2023-04-13 NOTE — Telephone Encounter (Signed)
Reaching out to patient to offer assistance regarding upcoming cardiac imaging study; pt verbalizes understanding of appt date/time, parking situation and where to check in, pre-test NPO status and medications ordered, and verified current allergies; name and call back number provided for further questions should they arise Rogerio Boutelle RN Navigator Cardiac Imaging [facility] [phone] office [phone] cell 

## 2023-04-13 NOTE — Assessment & Plan Note (Addendum)
Glucoses controlled with diet alone. Farxiga added in May for HF Lab Results  Component Value Date   HGBA1C 5.4 02/08/2023

## 2023-04-13 NOTE — Assessment & Plan Note (Signed)
Stable exam with well controlled BP.  Currently taking Coreg and Entresto.  Discharged in May on Lasix. Tolerating medications without concerns or side effects. Will continue to recommend low sodium diet and current regimen.

## 2023-04-13 NOTE — Progress Notes (Signed)
Date:  04/13/2023   Name:  Katrina Poole   DOB:  [birth year]   MRN:  [id number]   Chief Complaint: Annual Exam Katrina Poole is a 77 y.o. female who presents today for her Complete Annual Exam. She feels well. She reports exercising/ walking. She reports she is sleeping poorly. Breast complaints - none. She is working 2 days a week and enjoys it.  Mammogram: 11/2022 [facility] DEXA: 04/2019 Colonoscopy: 05/2016 repeat 10 yrs  Health Maintenance Due  Topic Date Due   Zoster Vaccines- Shingrix (1 of 2) Never done   COVID-19 Vaccine (4 - 2023-24 season) 06/05/2022    Immunization History  Administered Date(s) Administered   Moderna Sars-Covid-2 Vaccination 11/19/2019, 12/18/2019, 08/21/2020   Pneumococcal Conjugate-13 12/26/2013   Pneumococcal Polysaccharide-23 01/22/2015   Tdap 06/10/2019    Hypertension This is a chronic problem. The problem is controlled. Pertinent negatives include no chest pain, headaches, palpitations or shortness of breath. Past treatments include angiotensin blockers, diuretics and beta blockers. Hypertensive end-organ damage includes kidney disease, CAD/MI and heart failure.  Hyperlipidemia This is a chronic problem. The problem is uncontrolled. Recent lipid tests were reviewed and are high. Pertinent negatives include no chest pain or shortness of breath. Current antihyperlipidemic treatment includes statins. The current treatment provides moderate improvement of lipids.  HFrEF - dx in May, now on more medications, tolerating the medications and working hard to limit her sodium intake.  Lab Results  Component Value Date   NA 132 (L) 03/16/2023   K 4.8 03/16/2023   CO2 24 03/16/2023   GLUCOSE 92 03/16/2023   BUN 31 (H) 03/16/2023   CREATININE 1.31 (H) 03/16/2023   CALCIUM 9.5 03/16/2023   EGFR 42 (L) 03/16/2023   GFRNONAA 51 (L) 03/04/2023   Lab Results  Component Value Date   CHOL 184 02/09/2023   HDL 61 02/09/2023   LDLCALC 107 (H) 02/09/2023    TRIG 81 02/09/2023   CHOLHDL 3.0 02/09/2023   Lab Results  Component Value Date   TSH 1.615 02/08/2023   Lab Results  Component Value Date   HGBA1C 5.4 02/08/2023   Lab Results  Component Value Date   WBC 8.4 02/08/2023   HGB 10.8 (L) 03/04/2023   HCT 33.5 (L) 03/04/2023   MCV 86.2 02/08/2023   PLT 292 02/08/2023   Lab Results  Component Value Date   ALT 21 02/08/2023   AST 20 02/08/2023   ALKPHOS 100 02/08/2023   BILITOT 0.8 02/08/2023   No results found for: "25OHVITD2", "25OHVITD3", "VD25OH"   Review of Systems  Constitutional:  Negative for chills, fatigue and fever.  HENT:  Negative for congestion, hearing loss, tinnitus, trouble swallowing and voice change.   Eyes:  Negative for visual disturbance.  Respiratory:  Negative for cough, chest tightness, shortness of breath and wheezing.   Cardiovascular:  Negative for chest pain, palpitations and leg swelling.  Gastrointestinal:  Positive for constipation and diarrhea. Negative for abdominal pain and vomiting.  Endocrine: Negative for polydipsia and polyuria.  Genitourinary:  Positive for urgency. Negative for dysuria, frequency, genital sores, vaginal bleeding and vaginal discharge.  Musculoskeletal:  Negative for arthralgias, gait problem and joint swelling.  Skin:  Negative for color change and rash.  Neurological:  Negative for dizziness, tremors, light-headedness and headaches.  Hematological:  Negative for adenopathy. Does not bruise/bleed easily.  Psychiatric/Behavioral:  Negative for dysphoric mood and sleep disturbance. The patient is not nervous/anxious.     Patient Active Problem List  Diagnosis Date Noted   Cardiomyopathy (HCC) 02/08/2023   Chronic HFrEF (heart failure with reduced ejection fraction) (HCC) 02/08/2023   CAD (coronary artery disease) 02/08/2023   Allergic pharyngitis 01/14/2023   Left bundle branch block 11/18/2022   DDD (degenerative disc disease), lumbar 10/09/2022   Lumbar  spondylosis 09/17/2022   Prediabetes 04/22/2022   Spinal stenosis at L4-L5 level 02/09/2022   Lumbar radiculopathy 07/21/2021   Heart palpitations 07/01/2021   Mixed hyperlipidemia 07/01/2021   Generalized anxiety disorder 07/01/2021   Body mass index (BMI) 40.0-44.9, adult (HCC) 06/12/2021   Essential hypertension 08/16/2020   Meniere's disease 08/16/2020   Restless leg syndrome 08/16/2020   Primary insomnia 08/16/2020   Mild intermittent asthma without complication 08/16/2020   Diverticulosis of colon 08/16/2020   HNP (herniated nucleus pulposus), lumbar 04/29/2020   Gastroesophageal reflux disease without esophagitis 01/24/2020   Spondylosis of cervical spine 03/16/2018   Osteoarthritis of knee 02/06/2018   S/P total hip arthroplasty 11/25/2015    Allergies  Allergen Reactions   Azithromycin Hives and Other (See Comments)   Sulfa Antibiotics Other (See Comments) and Rash    Hallucinations   Oxycodone Other (See Comments)    Makes head foggy and interferes with Meniere's syndrome.     Sulfacetamide    Beef-Derived Products Other (See Comments)    Tears up my stomach.    Past Surgical History:  Procedure Laterality Date   ABDOMINAL EXPOSURE N/A 10/09/2022   Procedure: ABDOMINAL EXPOSURE;  Surgeon: Cephus Shelling, MD;  Location: [facility] OR;  Service: Vascular;  Laterality: N/A;   ANTERIOR CERVICAL DECOMP/DISCECTOMY FUSION N/A 03/16/2018   Procedure: Anterior Cervical Discectomy Fusion - Cervical Three- Cervical Four, Cervical Four- Cervical Five, Cervical Five-Cervical Six, Cervical Six- Cerival Seven;  Surgeon: Donalee Citrin, MD;  Location: [facility] OR;  Service: Neurosurgery;  Laterality: N/A;  Anterior Cervical Discectomy Fusion - Cervical Three- Cervical Four, Cervical Four- Cervical Five, Cervical Five-Cervical Six, Cervical Six- Ceriva   ANTERIOR LUMBAR FUSION N/A 10/09/2022   Procedure: Anteriorr Lumbar Interbody Fusion - Level Five-Sacral One  with posterior cortical screw  augmentation and decompressive laminotomy on the right;  Surgeon: Donalee Citrin, MD;  Location: [facility] OR;  Service: Neurosurgery;  Laterality: N/A;   BUNIONECTOMY     Left x1, Right x2.   CARDIAC CATHETERIZATION     x2 both normal   CARPAL TUNNEL RELEASE Right    COLONOSCOPY     COLONOSCOPY WITH PROPOFOL N/A 06/04/2016   Procedure: COLONOSCOPY WITH PROPOFOL;  Surgeon: Christena Deem, MD;  Location: [facility] ENDOSCOPY;  Service: Endoscopy;  Laterality: N/A;   DILATION AND CURETTAGE OF UTERUS     ECTOPIC PREGNANCY SURGERY     ESOPHAGOGASTRODUODENOSCOPY (EGD) WITH PROPOFOL N/A 06/04/2016   Procedure: ESOPHAGOGASTRODUODENOSCOPY (EGD) WITH PROPOFOL;  Surgeon: Christena Deem, MD;  Location: [facility] ENDOSCOPY;  Service: Endoscopy;  Laterality: N/A;   exploratory laparoscopy     infertility surgery   HALLUX VALGUS CORRECTION Bilateral    JOINT REPLACEMENT Bilateral    partial knee   KNEE ARTHROPLASTY Left 01/27/2021   Procedure: CONVERSION OF LEFT PARTIAL KNEE TO LEFT TOTAL KNEE.;  Surgeon: Donato Heinz, MD;  Location: [facility] ORS;  Service: Orthopedics;  Laterality: Left;   KNEE ARTHROSCOPY Left 07/19/2015   Procedure: Arthroscopic limited synovectomy along with lateral compartment and retropatellar chondral debridement plus removal of several small loose bodies;  Surgeon: Erin Sons, MD;  Location: [facility];  Service: Orthopedics;  Laterality: Left;  1ST CASE PER CECE  LAMINECTOMY WITH POSTERIOR LATERAL ARTHRODESIS LEVEL 2 N/A 10/09/2022   Procedure: LAMINECTOMY WITH POSTERIOR LATERAL ARTHRODESIS LEVEL TWO;  Surgeon: Donalee Citrin, MD;  Location: [facility] OR;  Service: Neurosurgery;  Laterality: N/A;   LEFT HEART CATH AND CORONARY ANGIOGRAPHY Left 02/08/2023   Procedure: LEFT HEART CATH AND CORONARY ANGIOGRAPHY;  Surgeon: Iran Ouch, MD;  Location: [facility] INVASIVE CV LAB;  Service: Cardiovascular;  Laterality: Left;   LUMBAR LAMINECTOMY/DECOMPRESSION MICRODISCECTOMY Right 04/29/2020    Procedure: Microdiscectomy - Lumbar Three-Lumbar Four - Lumbar Four-Lumbar Five - right;  Surgeon: Donalee Citrin, MD;  Location: [facility] OR;  Service: Neurosurgery;  Laterality: Right;  Microdiscectomy - Lumbar Three-Lumbar Four - Lumbar Four-Lumbar Five - right   MEDIAL PARTIAL KNEE REPLACEMENT Bilateral    POSTERIOR LUMBAR FUSION  02/2022   L3-4 and L4-5   SHOULDER SURGERY Right    TOTAL HIP ARTHROPLASTY Left 11/25/2015   Procedure: TOTAL HIP ARTHROPLASTY;  Surgeon: Donato Heinz, MD;  Location: [facility] ORS;  Service: Orthopedics;  Laterality: Left;   TRIGGER FINGER RELEASE Left     Social History   Tobacco Use   Smoking status: Former    Packs/day: 1.50    Years: 30.00    Additional pack years: 0.00    Total pack years: 45.00    Types: Cigarettes    Quit date: 11/12/1990    Years since quitting: 32.4   Smokeless tobacco: Never  Vaping Use   Vaping Use: Never used  Substance Use Topics   Alcohol use: Yes    Alcohol/week: 1.0 standard drink of alcohol    Types: 1 Standard drinks or equivalent per week    Comment: OCC [place], maybe 1 drink a week   Drug use: No     Medication list has been reviewed and updated.  Current Meds  Medication Sig   acetaminophen (TYLENOL) 500 MG tablet Take 2 tablets (1,000 mg total) by mouth every 6 (six) hours as needed (DO NOT EXCEED 4000 MG DAILY FROM ALL SOURCES).   albuterol (VENTOLIN HFA) 108 (90 Base) MCG/ACT inhaler Inhale 2 puffs into the lungs every 6 (six) hours as needed for wheezing or shortness of breath.   alum hydroxide-mag trisilicate (GAVISCON) 80-20 MG CHEW chewable tablet Chew 1 tablet by mouth daily as needed for indigestion or heartburn.    carvedilol (COREG) 6.25 MG tablet Take 1 tablet (6.25 mg total) by mouth 2 (two) times daily.   cephALEXin (KEFLEX) 500 MG capsule TAKE 4 PRIOR TO DENTAL PROCEDURES   cholecalciferol (VITAMIN D3) 25 MCG (1000 UNIT) tablet Take 1,000 Units by mouth at bedtime.   clonazePAM (KLONOPIN) 0.5 MG  tablet Take 1 tablet (0.5 mg total) by mouth at bedtime as needed for anxiety.   dapagliflozin propanediol (FARXIGA) 10 MG TABS tablet Take 1 tablet (10 mg total) by mouth daily.   furosemide (LASIX) 20 MG tablet Take 1 tablet (20 mg total) by mouth daily as needed (swelling, shortness of breath, gain 3 lbs overnight or 5 lbs in a week).   gabapentin (NEURONTIN) 300 MG capsule TAKE 1 TO 2 CAPSULES BY MOUTH AT BEDTIME   meclizine (ANTIVERT) 12.5 MG tablet Take 12.5 mg by mouth 3 (three) times daily as needed for dizziness. Dr. Eduard Clos   Multiple Vitamin (MULTIVITAMIN) tablet Take 1 tablet by mouth every morning.   potassium chloride (KLOR-CON M) 10 MEQ tablet Take 1 tablet (10 mEq total) by mouth as needed (with lasix).   Probiotic Product (PROBIOTIC DAILY PO) Take 1  capsule by mouth daily.   rosuvastatin (CRESTOR) 20 MG tablet Take 1 tablet (20 mg total) by mouth daily.   sacubitril-valsartan (ENTRESTO) 49-51 MG Take 1 tablet by mouth twice daily   spironolactone (ALDACTONE) 25 MG tablet Take 1 tablet (25 mg total) by mouth daily.   traMADol (ULTRAM) 50 MG tablet Take 50 mg by mouth every 6 (six) hours as needed for moderate pain.   zolpidem (AMBIEN) 5 MG tablet TAKE 1 TABLET BY MOUTH AT BEDTIME   [DISCONTINUED] meloxicam (MOBIC) 7.5 MG tablet Take 1 tablet (7.5 mg total) by mouth 2 (two) times daily.       04/13/2023    8:13 AM 11/18/2022    8:58 AM 04/22/2022    8:30 AM 10/23/2021    8:54 AM  GAD 7 : Generalized Anxiety Score  Nervous, Anxious, on Edge 0 0 1 0  Control/stop worrying 0 0 1 0  Worry too much - different things 1 0 1 0  Trouble relaxing 1 0 0 1  Restless 1 0 1 1  Easily annoyed or irritable 1 0 2 0  Afraid - awful might happen 0 0 0 1  Total GAD 7 Score 4 0 6 3  Anxiety Difficulty Not difficult at all Not difficult at all Not difficult at all        04/13/2023    8:12 AM 11/18/2022    8:58 AM 10/16/2022    8:24 AM  Depression screen PHQ 2/9  Decreased Interest 1 0 0   Down, Depressed, Hopeless 0 0 0  PHQ - 2 Score 1 0 0  Altered sleeping 2 0   Tired, decreased energy 1 0   Change in appetite 1 0   Feeling bad or failure about yourself  0 0   Trouble concentrating 0 0   Moving slowly or fidgety/restless 1 0   Suicidal thoughts 0 0   PHQ-9 Score 6 0   Difficult doing work/chores Not difficult at all Not difficult at all     BP Readings from Last 3 Encounters:  04/13/23 118/70  03/04/23 128/70  02/23/23 103/64    Physical Exam Vitals and nursing note reviewed.  Constitutional:      General: She is not in acute distress.    Appearance: She is well-developed.  HENT:     Head: Normocephalic and atraumatic.     Right Ear: Tympanic membrane and ear canal normal.     Left Ear: Tympanic membrane and ear canal normal.     Nose:     Right Sinus: No maxillary sinus tenderness.     Left Sinus: No maxillary sinus tenderness.  Eyes:     General: No scleral icterus.       Right eye: No discharge.        Left eye: No discharge.     Conjunctiva/sclera: Conjunctivae normal.  Neck:     Thyroid: No thyromegaly.     Vascular: No carotid bruit.  Cardiovascular:     Rate and Rhythm: Normal rate and regular rhythm.     Pulses: Normal pulses.     Heart sounds: Normal heart sounds.  Pulmonary:     Effort: Pulmonary effort is normal. No respiratory distress.     Breath sounds: No wheezing.  Chest:  Breasts:    Right: No mass, nipple discharge, skin change or tenderness.     Left: No mass, nipple discharge, skin change or tenderness.  Abdominal:     General: Bowel sounds are  normal.     Palpations: Abdomen is soft.     Tenderness: There is no abdominal tenderness.  Musculoskeletal:     Cervical back: Normal range of motion. No erythema.     Right lower leg: No edema.     Left lower leg: No edema.  Lymphadenopathy:     Cervical: No cervical adenopathy.  Skin:    General: Skin is warm and dry.     Capillary Refill: Capillary refill takes less than  2 seconds.     Findings: No rash.  Neurological:     General: No focal deficit present.     Mental Status: She is alert and oriented to person, place, and time.     Cranial Nerves: No cranial nerve deficit.     Sensory: No sensory deficit.     Deep Tendon Reflexes: Reflexes are normal and symmetric.  Psychiatric:        Attention and Perception: Attention normal.        Mood and Affect: Mood normal.     Wt Readings from Last 3 Encounters:  04/13/23 194 lb (88 kg)  03/04/23 198 lb (89.8 kg)  02/23/23 199 lb (90.3 kg)    BP 118/70   Pulse 60   Ht 5\' 2"  (1.575 m)   Wt 194 lb (88 kg)   SpO2 96%   BMI 35.48 kg/m   Assessment and Plan:  Problem List Items Addressed This Visit     Restless leg syndrome (Chronic)    RLS continues to be an intermittent issue Treated with gabapentin and PRN Clonazepam      Prediabetes    Glucoses controlled with diet alone. Farxiga added in May for HF Lab Results  Component Value Date   HGBA1C 5.4 02/08/2023        Mixed hyperlipidemia (Chronic)    Tolerating statin medications.  No side effects noted. LDL is  Lab Results  Component Value Date   LDLCALC 107 (H) 02/09/2023  Crestor 20 mg.       Generalized anxiety disorder    Mild intermittent symptoms for which she takes PRN clonazepam and Ambien for sleep.      Essential hypertension (Chronic)    Stable exam with well controlled BP.  Currently taking Coreg and Entresto.  Discharged in May on Lasix. Tolerating medications without concerns or side effects. Will continue to recommend low sodium diet and current regimen.       Chronic HFrEF (heart failure with reduced ejection fraction) (HCC)    On Coreg and Entresto with Lasix and spironolactone. Farxiga added in May at hospital stay.      Other Visit Diagnoses     Annual physical exam    -  Primary   BMI 40.0-44.9, adult (HCC)   (Chronic)     Need for vaccination for pneumococcus       Relevant Orders   Pneumococcal  conjugate vaccine 20-valent       Return in about 6 months (around 10/14/2023) for HTN.   Partially dictated using Dragon software, any errors are not intentional.  Reubin Milan, MD [facility] [place], [place]

## 2023-04-13 NOTE — Assessment & Plan Note (Signed)
Tolerating statin medications.  No side effects noted. LDL is  Lab Results  Component Value Date   LDLCALC 107 (H) 02/09/2023  Crestor 20 mg.

## 2023-04-13 NOTE — Assessment & Plan Note (Signed)
On Coreg and Entresto with Lasix and spironolactone. Farxiga added in May at hospital stay.

## 2023-04-13 NOTE — Assessment & Plan Note (Signed)
Mild intermittent symptoms for which she takes PRN clonazepam and Ambien for sleep.

## 2023-04-14 ENCOUNTER — Other Ambulatory Visit: Payer: Self-pay | Admitting: Cardiology

## 2023-04-14 ENCOUNTER — Ambulatory Visit
Admission: RE | Admit: 2023-04-14 | Discharge: 2023-04-14 | Disposition: A | Discharge status: Home / Self Care | Payer: Medicare Other | Source: Ambulatory Visit | Attending: Cardiology | Admitting: Cardiology

## 2023-04-14 DIAGNOSIS — I5042 Chronic combined systolic (congestive) and diastolic (congestive) heart failure: Secondary | ICD-10-CM

## 2023-04-14 MED ORDER — GADOBUTROL 1 MMOL/ML IV SOLN
11.0000 mL | Freq: Once | INTRAVENOUS | Status: AC | PRN
  Administered 2023-04-14: 11 mL via INTRAVENOUS

## 2023-04-16 ENCOUNTER — Ambulatory Visit (HOSPITAL_BASED_OUTPATIENT_CLINIC_OR_DEPARTMENT_OTHER): Payer: Medicare Other | Admitting: Cardiology

## 2023-04-16 ENCOUNTER — Encounter: Payer: Self-pay | Admitting: Cardiology

## 2023-04-16 ENCOUNTER — Other Ambulatory Visit
Admission: RE | Admit: 2023-04-16 | Discharge: 2023-04-16 | Disposition: A | Discharge status: Home / Self Care | Payer: Medicare Other | Source: Ambulatory Visit | Attending: Cardiology | Admitting: Cardiology

## 2023-04-16 VITALS — BP 133/62 | HR 52 | Wt 197.4 lb

## 2023-04-16 DIAGNOSIS — I5042 Chronic combined systolic (congestive) and diastolic (congestive) heart failure: Secondary | ICD-10-CM

## 2023-04-16 LAB — COMPREHENSIVE METABOLIC PANEL
ALT: 14 U/L (ref 0–44)
AST: 17 U/L (ref 15–41)
Albumin: 4 g/dL (ref 3.5–5.0)
Alkaline Phosphatase: 81 U/L (ref 38–126)
Anion gap: 9 (ref 5–15)
BUN: 26 mg/dL — ABNORMAL HIGH (ref 8–23)
CO2: 24 mmol/L (ref 22–32)
Calcium: 9.4 mg/dL (ref 8.9–10.3)
Chloride: 102 mmol/L (ref 98–111)
Creatinine, Ser: 1.1 mg/dL — ABNORMAL HIGH (ref 0.44–1.00)
GFR, Estimated: 52 mL/min — ABNORMAL LOW (ref >60)
Glucose, Bld: 101 mg/dL — ABNORMAL HIGH (ref 70–99)
Potassium: 4.5 mmol/L (ref 3.5–5.1)
Sodium: 135 mmol/L (ref 135–145)
Total Bilirubin: 0.3 mg/dL (ref 0.3–1.2)
Total Protein: 7 g/dL (ref 6.5–8.1)

## 2023-04-16 LAB — BRAIN NATRIURETIC PEPTIDE: B Natriuretic Peptide: 154 pg/mL — ABNORMAL HIGH (ref 0.0–100.0)

## 2023-04-16 MED ORDER — SACUBITRIL-VALSARTAN 97-103 MG PO TABS: 1.0000 | ORAL_TABLET | Freq: Two times a day (BID) | ORAL | 6 refills | Status: DC

## 2023-04-16 NOTE — Patient Instructions (Addendum)
Medication Changes:  Increase Entresto to 97/103 mg (1 tablets) 2 times a day.   Lab Work:  Labs done today, your results will be available in MyChart, we will contact you for abnormal readings.   Prescription for labs to be repeated in 10 days given.   You have been referred to Pulmonology. You will receive a call to schedule this appointment.     Special Instructions // Education:  Do the following things EVERYDAY: Weigh yourself in the morning before breakfast. Write it down and keep it in a log. Take your medicines as prescribed Eat low salt foods--Limit salt (sodium) to 2000 mg per day.  Stay as active as you can everyday Limit all fluids for the day to less than 2 liters   Follow-Up in: follow up in 3 months. Please call our clinic in September to schedule your October appointment.    If you have any questions or concerns before your next appointment please send [place] a message through [place] or call our office at [phone] Monday-Friday 8 am-5 pm.   If you have an urgent need after hours on the weekend please call your Primary Cardiologist or the [facility] in [place] at [phone].

## 2023-04-18 NOTE — Progress Notes (Signed)
PCP: Reubin Milan, MD Cardiology: Dr. Azucena Cecil HF Cardiology: Dr. Shirlee Latch  77 y.o.with history of HTN and osteoarthritis was found to have a cardiomyopathy in 4/24.  Patient reports 2 prior cardiac caths several years ago, one at [facility] and one at [facility]  Neither showed obstructive CAD.  She does have a strong family history of CAD (multiple siblings with MIs/CAD).  She never had echoes with prior workups per her report.  Patient was found to have a LBBB, so echo was ordered by Dr. Azucena Cecil.  Echo was done in 4/24 and showed EF 25-30%, moderate LV dilation, normal RV, and moderate MR.  Due to low EF, she was set up for LHC that was done in 5/24.  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  Patient was short of breath and was put on Bipap.  She was markedly hypertensive.  She was admitted overnight and diuresed.   Cardiac MRI in 7/24 showed normal LV size with EF 38%, septal-lateral dyssynchrony, RV EF 56%, no LGE, ECV 28%.    Patient returns for followup of CHF. Weight is down 1 lb.  Main limitations are knee and back pain.  She is short of breath walking up hills but does ok walking on flat ground.  No chest pain.  No orthopnea/PND.  She walks for exercise.     ECG (personally reviewed): NSR, LBBB 134 msec  Labs (5/24): K 4.2, creatinine 1.2, LDL 107 Labs (6/24): BNP 75, K 4.8, creatinine 1.31  PMH: 1. LBBB 2. CAD: LHC ([facility] in 2013) with 50% LAD stenosis.  - LHC (5/24): 30% pLAD, 40% mLAD.  3. Chronic systolic CHF: Nonischemic cardiomyopathy.  - Echo (4/24): EF 25-30%, normal RV, moderate MR.  - Cardiac MRI (7/24): normal LV size with EF 38%, septal-lateral dyssynchrony, RV EF 56%, no LGE, ECV 28%.  4. HTN 5. Meniere's disease 6. Lumbar spinal fusion 1/24 7. OA 8. Asthma  Social History   Socioeconomic History   Marital status: Married    Spouse name: Stann Ore   Number of children: 1   Years of education: Not on file    Highest education level: Not on file  Occupational History   Occupation: Part time  Tobacco Use   Smoking status: Former    Current packs/day: 0.00    Average packs/day: 1.5 packs/day for 30.0 years (45.0 ttl pk-yrs)    Types: Cigarettes    Start date: 11/12/1960    Quit date: 11/12/1990    Years since quitting: 32.4   Smokeless tobacco: Never  Vaping Use   Vaping status: Never Used  Substance and Sexual Activity   Alcohol use: Yes    Alcohol/week: 1.0 standard drink of alcohol    Types: 1 Standard drinks or equivalent per week    Comment: OCC GL[place] OF BRANDY, maybe 1 drink a week   Drug use: No   Sexual activity: Not Currently  Other Topics Concern   Not on file  Social History Narrative   Not on file   Social Determinants of Health   Financial Resource Strain: Low Risk  (10/16/2022)   Overall Financial Resource Strain (CARDIA)    Difficulty of Paying Living Expenses: Not hard at all  Food Insecurity: No Food Insecurity (02/11/2023)   Hunger Vital Sign    Worried About Running Out of Food in the Last Year: Never true    Ran Out of Food in the Last Year: Never true  Transportation Needs:  No Transportation Needs (02/11/2023)   PRAPARE - Administrator, Civil Service (Medical): No    Lack of Transportation (Non-Medical): No  Physical Activity: Insufficiently Active (10/16/2022)   Exercise Vital Sign    Days of Exercise per Week: 5 days    Minutes of Exercise per Session: 10 min  Stress: No Stress Concern Present (10/16/2022)   [employer] of Occupational Health - Occupational Stress Questionnaire    Feeling of Stress : Not at all  Social Connections: Moderately Integrated (10/16/2022)   Social Connection and Isolation Panel [NHANES]    Frequency of Communication with Friends and Family: More than three times a week    Frequency of Social Gatherings with Friends and Family: Once a week    Attends Religious Services: Never    Database administrator or  Organizations: No    Attends Engineer, structural: More than 4 times per year    Marital Status: Married  Catering manager Violence: Not At Risk (10/16/2022)   Humiliation, Afraid, Rape, and Kick questionnaire    Fear of Current or Ex-Partner: No    Emotionally Abused: No    Physically Abused: No    Sexually Abused: No   FH: Brother died at 1 of MI, brother died at 61 of MI, brother died in 7s of CVA, mother with CHF.   ROS: All systems reviewed and negative except as per HPI.   Current Outpatient Medications  Medication Sig Dispense Refill   acetaminophen (TYLENOL) 500 MG tablet Take 2 tablets (1,000 mg total) by mouth every 6 (six) hours as needed (DO NOT EXCEED 4000 MG DAILY FROM ALL SOURCES). 30 tablet 2   albuterol (VENTOLIN HFA) 108 (90 Base) MCG/ACT inhaler Inhale 2 puffs into the lungs every 6 (six) hours as needed for wheezing or shortness of breath. 18 g 1   carvedilol (COREG) 6.25 MG tablet Take 1 tablet (6.25 mg total) by mouth 2 (two) times daily. 180 tablet 3   cholecalciferol (VITAMIN D3) 25 MCG (1000 UNIT) tablet Take 1,000 Units by mouth at bedtime.     clonazePAM (KLONOPIN) 0.5 MG tablet Take 1 tablet (0.5 mg total) by mouth at bedtime as needed for anxiety. 30 tablet 0   dapagliflozin propanediol (FARXIGA) 10 MG TABS tablet Take 1 tablet (10 mg total) by mouth daily. 60 tablet 1   furosemide (LASIX) 20 MG tablet Take 1 tablet (20 mg total) by mouth daily as needed (swelling, shortness of breath, gain 3 lbs overnight or 5 lbs in a week). 30 tablet 3   gabapentin (NEURONTIN) 300 MG capsule TAKE 1 TO 2 CAPSULES BY MOUTH AT BEDTIME 180 capsule 0   Multiple Vitamin (MULTIVITAMIN) tablet Take 1 tablet by mouth every morning.     potassium chloride (KLOR-CON M) 10 MEQ tablet Take 1 tablet (10 mEq total) by mouth as needed (with lasix). 30 tablet 3   Probiotic Product (PROBIOTIC DAILY PO) Take 1 capsule by mouth daily.     rosuvastatin (CRESTOR) 20 MG tablet Take 1  tablet (20 mg total) by mouth daily. 30 tablet 5   sacubitril-valsartan (ENTRESTO) 97-103 MG Take 1 tablet by mouth 2 (two) times daily. 60 tablet 6   spironolactone (ALDACTONE) 25 MG tablet Take 1 tablet (25 mg total) by mouth daily. 30 tablet 2   traMADol (ULTRAM) 50 MG tablet Take 50 mg by mouth every 6 (six) hours as needed for moderate pain.     zolpidem (AMBIEN) 5 MG tablet  TAKE 1 TABLET BY MOUTH AT BEDTIME 30 tablet 5   alum hydroxide-mag trisilicate (GAVISCON) 80-20 MG CHEW chewable tablet Chew 1 tablet by mouth daily as needed for indigestion or heartburn.  (Patient not taking: Reported on 04/16/2023)     cephALEXin (KEFLEX) 500 MG capsule TAKE 4 PRIOR TO DENTAL PROCEDURES (Patient not taking: Reported on 04/16/2023) 4 capsule 1   meclizine (ANTIVERT) 12.5 MG tablet Take 12.5 mg by mouth 3 (three) times daily as needed for dizziness. Dr. Eduard Clos (Patient not taking: Reported on 04/16/2023)     No current facility-administered medications for this visit.   BP 133/62 (BP Location: Right Arm, Patient Position: Sitting)   Pulse (!) 52   Wt 197 lb 6.4 oz (89.5 kg)   SpO2 100%   BMI 36.10 kg/m  General: NAD Neck: No JVD, no thyromegaly or thyroid nodule.  Lungs: Clear to auscultation bilaterally with normal respiratory effort. CV: Nondisplaced PMI.  Heart regular S1/S2, no S3/S4, no murmur.  No peripheral edema.  No carotid bruit.  Normal pedal pulses.  Abdomen: Soft, nontender, no hepatosplenomegaly, no distention.  Skin: Intact without lesions or rashes.  Neurologic: Alert and oriented x 3.  Psych: Normal affect. Extremities: No clubbing or cyanosis.  HEENT: Normal.   Assessment/Plan: 1. Chronic systolic CHF: Nonischemic cardiomyopathy.  No heavy ETOH/drugs.  No FH of dilated cardiomyopathy (has FH of CAD).  Patient was found to have LBBB, leading to echo in 4/24 showing EF 25-30%, moderate LV dilation, normal RV, and moderate MR.  Due to low EF, she was set up for LHC showing  nonobstructive CAD (40% mLAD stenosis).  Cardiac MRI in 7/24 showed normal LV size with EF 38%, septal-lateral dyssynchrony, RV EF 56%, no delayed enhancement, ECV 28%. NYHA class II, not volume overloaded.   - Keep Lasix prn.  - Increase Entresto to 97/103 bid, BMET/BNP today and BMET in 10 days.  - Continue spironolactone 25 mg daily.  - Continue Farxiga 10 mg daily.  - Continue Coreg 6.25 mg bid, will not increase with HR in 50s.  - She has LBBB 134 msec on ECG, This does not predict significant benefit from CRT, but she is noted to have septal-lateral dyssynchrony on imaging.  For now, this is a moot point as her EF was > 35% by cardiac MRI.  2. CAD: Nonobstructive.  Strong FH of MI.  - Continue statin.  3. HTN: BP controlled.  4. ?Asthma: Patient has been given a history of asthma and has been given inhalers, but questions whether breathing issues are all cardiac.  She would like pulmonary evaluation, I will provide referral.   Followup 3 months.    Marca Ancona 04/18/2023

## 2023-04-20 DIAGNOSIS — M4317 Spondylolisthesis, lumbosacral region: Secondary | ICD-10-CM | POA: Diagnosis not present

## 2023-04-27 ENCOUNTER — Other Ambulatory Visit (HOSPITAL_COMMUNITY): Payer: Self-pay | Admitting: Cardiology

## 2023-04-27 DIAGNOSIS — I5022 Chronic systolic (congestive) heart failure: Secondary | ICD-10-CM | POA: Diagnosis not present

## 2023-04-28 LAB — BASIC METABOLIC PANEL
BUN/Creatinine Ratio: 20 (ref 12–28)
BUN: 20 mg/dL (ref 8–27)
CO2: 21 mmol/L (ref 20–29)
Calcium: 9.2 mg/dL (ref 8.7–10.3)
Chloride: 105 mmol/L (ref 96–106)
Creatinine, Ser: 1 mg/dL (ref 0.57–1.00)
Glucose: 91 mg/dL (ref 70–99)
Potassium: 4.6 mmol/L (ref 3.5–5.2)
Sodium: 138 mmol/L (ref 134–144)
eGFR: 59 mL/min/{1.73_m2} — ABNORMAL LOW (ref >59)

## 2023-04-28 LAB — SPECIMEN STATUS REPORT

## 2023-05-03 ENCOUNTER — Other Ambulatory Visit: Payer: Self-pay

## 2023-05-03 DIAGNOSIS — R2689 Other abnormalities of gait and mobility: Secondary | ICD-10-CM | POA: Diagnosis not present

## 2023-05-03 DIAGNOSIS — M4317 Spondylolisthesis, lumbosacral region: Secondary | ICD-10-CM | POA: Diagnosis not present

## 2023-05-03 DIAGNOSIS — M6281 Muscle weakness (generalized): Secondary | ICD-10-CM | POA: Diagnosis not present

## 2023-05-03 MED ORDER — DAPAGLIFLOZIN PROPANEDIOL 10 MG PO TABS: 10.0000 mg | ORAL_TABLET | Freq: Every day | ORAL | 1 refills | Status: DC

## 2023-05-04 ENCOUNTER — Ambulatory Visit: Payer: Medicare Other | Admitting: Cardiology

## 2023-05-06 ENCOUNTER — Encounter: Payer: Medicare Other | Admitting: Internal Medicine

## 2023-05-07 DIAGNOSIS — M6281 Muscle weakness (generalized): Secondary | ICD-10-CM | POA: Diagnosis not present

## 2023-05-07 DIAGNOSIS — R2689 Other abnormalities of gait and mobility: Secondary | ICD-10-CM | POA: Diagnosis not present

## 2023-05-07 DIAGNOSIS — M4317 Spondylolisthesis, lumbosacral region: Secondary | ICD-10-CM | POA: Diagnosis not present

## 2023-05-10 ENCOUNTER — Other Ambulatory Visit: Payer: Self-pay

## 2023-05-10 MED ORDER — SPIRONOLACTONE 25 MG PO TABS: 25.0000 mg | ORAL_TABLET | Freq: Every day | ORAL | 3 refills | Status: DC

## 2023-05-12 ENCOUNTER — Ambulatory Visit (INDEPENDENT_AMBULATORY_CARE_PROVIDER_SITE_OTHER): Payer: Medicare Other | Admitting: Pulmonary Disease

## 2023-05-12 ENCOUNTER — Encounter: Payer: Self-pay | Admitting: Pulmonary Disease

## 2023-05-12 VITALS — BP 130/70 | HR 66 | Temp 97.8°F | Ht <= 58 in | Wt 198.0 lb

## 2023-05-12 DIAGNOSIS — J4520 Mild intermittent asthma, uncomplicated: Secondary | ICD-10-CM

## 2023-05-12 NOTE — Progress Notes (Signed)
Synopsis: Referred in by Katrina Milan, MD   Subjective:   PATIENT ID: Katrina Poole GENDER: female DOB: [birth year], MRN: [id number]  Chief Complaint  Patient presents with   pulmonary consult    Occ prod cough with clear sputum and occ wheezing.     HPI Katrina Poole is a 77 years old female patient with a past medical history of Nonischemic Cardiomyopathy recently diagnosed in May 2024, intermittent asthma, hyperlipidemia and peripheral neuropathy presenting to the pulmonary office for further evaluation of her asthma.   She has been complaining of shortness of breaht specifically on exertion and going uphill for years. She does report intermittent productive cough. She has hypersensitivity to perfumes but not so much cold air or dust. She was given the diagnosis of asthma early 2000s and has been on albuterol prn which helps. Now with the recent diagnosis of NICM she unsure what is causing her shortness of breath. She never required hospitalization for asthma nor has she required systemic steroids.   Family History: Mother with COPD (Smoker)   Social History - Quit smoking 30 years ago, smoked 1 and half pack per day for 20 years. Occasional alcohol use. No illicit drug use. Works as Corporate treasurer , no environmental exposures. No pets at home.   ROS All systems were reviewed and are negative except for the above.  Objective:   Vitals:   05/12/23 0934  BP: 130/70  Pulse: 66  Temp: 97.8 F (36.6 C)  TempSrc: Temporal  SpO2: 96%  Weight: 198 lb (89.8 kg)  Height: 4' 9.5" (1.461 m)   96% on RA BMI Readings from Last 3 Encounters:  05/12/23 42.10 kg/m  04/16/23 36.10 kg/m  04/13/23 35.48 kg/m   Wt Readings from Last 3 Encounters:  05/12/23 198 lb (89.8 kg)  04/16/23 197 lb 6.4 oz (89.5 kg)  04/13/23 194 lb (88 kg)    Physical Exam GEN: NAD, Healthy Appearing HEENT: Supple Neck, Reactive Pupils, EOMI  CVS: Normal S1, Normal S2, RRR, No murmurs or ES appreciated   Lungs: Clear bilateral air entry.  Abdomen: Soft, non tender, non distended, + BS  Extremities: Warm and well perfused, No edema  Skin: No suspicious lesions appreciated  Psych: Normal Affect  Ancillary Information   CBC    Component Value Date/Time   WBC 8.4 02/08/2023 1327   RBC 4.12 02/08/2023 1327   HGB 10.8 (L) 03/04/2023 1139   HGB 11.5 10/23/2021 0944   HCT 33.5 (L) 03/04/2023 1139   HCT 33.9 (L) 10/23/2021 0944   PLT 292 02/08/2023 1327   PLT 282 10/23/2021 0944   MCV 86.2 02/08/2023 1327   MCV 89 10/23/2021 0944   MCH 27.2 02/08/2023 1327   MCHC 31.5 02/08/2023 1327   RDW 16.0 (H) 02/08/2023 1327   RDW 13.1 10/23/2021 0944   LYMPHSABS 1.1 02/16/2022 2209   LYMPHSABS 1.0 10/23/2021 0944   MONOABS 0.7 02/16/2022 2209   EOSABS 0.4 02/16/2022 2209   EOSABS 0.1 10/23/2021 0944   BASOSABS 0.0 02/16/2022 2209   BASOSABS 0.1 10/23/2021 0944    Imaging  CXR May 2024: Diffuse interstitial and alveolar opacities consistent with pulmonary edema.  Echocardiogram 04/19:   1. Left ventricular ejection fraction, by estimation, is 25 to 30%. Left  ventricular ejection fraction by 2D MOD biplane is 30.8 %. The left  ventricle has severely decreased function. The left ventricle demonstrates  global hypokinesis. The left  ventricular internal cavity size was moderately dilated. Left ventricular  diastolic parameters are consistent with Grade I diastolic dysfunction  (impaired relaxation). The average left ventricular global longitudinal  strain is -12.3 %.   2. Right ventricular systolic function is normal. The right ventricular  size is normal. Tricuspid regurgitation signal is inadequate for assessing  PA pressure.   3. Left atrial size was moderately dilated.   4. The mitral valve is normal in structure. Moderate mitral valve  regurgitation. No evidence of mitral stenosis.   5. The aortic valve is normal in structure. Aortic valve regurgitation is  not visualized. No  aortic stenosis is present.   6. The inferior vena cava is normal in size with greater than 50%  respiratory variability, suggesting right atrial pressure of 3 mmHg.       No data to display           Assessment & Plan:  Katrina Poole is a 77 years old female patient with a past medical history of Nonischemic Cardiomyopathy recently diagnosed in May 2024, intermittent asthma, hyperlipidemia and peripheral neuropathy presenting to the pulmonary office for further evaluation of her asthma.   # Exertional shortness of breath. # Mild intermittent asthma # Recent diagnosis of nonischemic cardiomyopathy with EF 25 to 30% 04/19  Her dyspnea on exertion could be multifactorial in the setting of recent diagnosis of nonischemic cardiomyopathy as well as her asthma.  Her asthma seems to be intermittent and not severe to explain the degree of dyspnea on exertion specifically the fact that she was never hospitalized for asthma and never required systemic steroids.  However to better assess pulmonary function we will order a pulmonary function test and based on that we will decide on a methacholine challenge test.  We can also consider a trial inhaled ICS-LABA depending on her PFTs.  Should the cause behind her dyspnea on exertion remain unclear we might consider a cardiopulmonary exercise test.  []  Obtain pulmonary function test []  CBC with differential  Return in about 3 months (around 08/12/2023).  I spent 60 minutes caring for this patient today, including preparing to see the patient, obtaining a medical history , reviewing a separately obtained history, performing a medically appropriate examination and/or evaluation, counseling and educating the patient/family/caregiver, ordering medications, tests, or procedures, documenting clinical information in the electronic health record, and independently interpreting results (not separately reported/billed) and communicating results to the  patient/family/caregiver  Janann Colonel, MD [facility] 05/12/2023 10:12 AM

## 2023-05-13 DIAGNOSIS — R2689 Other abnormalities of gait and mobility: Secondary | ICD-10-CM | POA: Diagnosis not present

## 2023-05-13 DIAGNOSIS — M4317 Spondylolisthesis, lumbosacral region: Secondary | ICD-10-CM | POA: Diagnosis not present

## 2023-05-13 DIAGNOSIS — M6281 Muscle weakness (generalized): Secondary | ICD-10-CM | POA: Diagnosis not present

## 2023-05-18 DIAGNOSIS — M4317 Spondylolisthesis, lumbosacral region: Secondary | ICD-10-CM | POA: Diagnosis not present

## 2023-05-18 DIAGNOSIS — R2689 Other abnormalities of gait and mobility: Secondary | ICD-10-CM | POA: Diagnosis not present

## 2023-05-18 DIAGNOSIS — M6281 Muscle weakness (generalized): Secondary | ICD-10-CM | POA: Diagnosis not present

## 2023-05-21 ENCOUNTER — Telehealth: Payer: Self-pay

## 2023-05-21 DIAGNOSIS — R2689 Other abnormalities of gait and mobility: Secondary | ICD-10-CM | POA: Diagnosis not present

## 2023-05-21 DIAGNOSIS — M6281 Muscle weakness (generalized): Secondary | ICD-10-CM | POA: Diagnosis not present

## 2023-05-21 DIAGNOSIS — M4317 Spondylolisthesis, lumbosacral region: Secondary | ICD-10-CM | POA: Diagnosis not present

## 2023-05-21 NOTE — Telephone Encounter (Signed)
Called pt from recall list to schedule 3 mo f/u with Dr. Shirlee Latch. schedule f/u for 07/06/23 @ 9:15 AM.

## 2023-05-25 DIAGNOSIS — R2689 Other abnormalities of gait and mobility: Secondary | ICD-10-CM | POA: Diagnosis not present

## 2023-05-25 DIAGNOSIS — M6281 Muscle weakness (generalized): Secondary | ICD-10-CM | POA: Diagnosis not present

## 2023-05-25 DIAGNOSIS — M4317 Spondylolisthesis, lumbosacral region: Secondary | ICD-10-CM | POA: Diagnosis not present

## 2023-05-27 DIAGNOSIS — G8929 Other chronic pain: Secondary | ICD-10-CM | POA: Diagnosis not present

## 2023-05-27 DIAGNOSIS — Z96651 Presence of right artificial knee joint: Secondary | ICD-10-CM | POA: Diagnosis not present

## 2023-05-27 DIAGNOSIS — Z96652 Presence of left artificial knee joint: Secondary | ICD-10-CM | POA: Diagnosis not present

## 2023-05-27 DIAGNOSIS — Z96642 Presence of left artificial hip joint: Secondary | ICD-10-CM | POA: Diagnosis not present

## 2023-05-27 DIAGNOSIS — M25551 Pain in right hip: Secondary | ICD-10-CM | POA: Diagnosis not present

## 2023-05-28 DIAGNOSIS — M4317 Spondylolisthesis, lumbosacral region: Secondary | ICD-10-CM | POA: Diagnosis not present

## 2023-05-28 DIAGNOSIS — M6281 Muscle weakness (generalized): Secondary | ICD-10-CM | POA: Diagnosis not present

## 2023-05-28 DIAGNOSIS — R2689 Other abnormalities of gait and mobility: Secondary | ICD-10-CM | POA: Diagnosis not present

## 2023-06-01 DIAGNOSIS — M4317 Spondylolisthesis, lumbosacral region: Secondary | ICD-10-CM | POA: Diagnosis not present

## 2023-06-01 DIAGNOSIS — M6281 Muscle weakness (generalized): Secondary | ICD-10-CM | POA: Diagnosis not present

## 2023-06-01 DIAGNOSIS — R2689 Other abnormalities of gait and mobility: Secondary | ICD-10-CM | POA: Diagnosis not present

## 2023-06-04 DIAGNOSIS — M6281 Muscle weakness (generalized): Secondary | ICD-10-CM | POA: Diagnosis not present

## 2023-06-04 DIAGNOSIS — M4317 Spondylolisthesis, lumbosacral region: Secondary | ICD-10-CM | POA: Diagnosis not present

## 2023-06-04 DIAGNOSIS — R2689 Other abnormalities of gait and mobility: Secondary | ICD-10-CM | POA: Diagnosis not present

## 2023-06-08 DIAGNOSIS — M4317 Spondylolisthesis, lumbosacral region: Secondary | ICD-10-CM | POA: Diagnosis not present

## 2023-06-08 DIAGNOSIS — R2689 Other abnormalities of gait and mobility: Secondary | ICD-10-CM | POA: Diagnosis not present

## 2023-06-08 DIAGNOSIS — M6281 Muscle weakness (generalized): Secondary | ICD-10-CM | POA: Diagnosis not present

## 2023-06-15 DIAGNOSIS — R2689 Other abnormalities of gait and mobility: Secondary | ICD-10-CM | POA: Diagnosis not present

## 2023-06-15 DIAGNOSIS — M6281 Muscle weakness (generalized): Secondary | ICD-10-CM | POA: Diagnosis not present

## 2023-06-15 DIAGNOSIS — M4317 Spondylolisthesis, lumbosacral region: Secondary | ICD-10-CM | POA: Diagnosis not present

## 2023-06-22 DIAGNOSIS — M4317 Spondylolisthesis, lumbosacral region: Secondary | ICD-10-CM | POA: Diagnosis not present

## 2023-06-22 DIAGNOSIS — R2689 Other abnormalities of gait and mobility: Secondary | ICD-10-CM | POA: Diagnosis not present

## 2023-06-22 DIAGNOSIS — M6281 Muscle weakness (generalized): Secondary | ICD-10-CM | POA: Diagnosis not present

## 2023-06-29 DIAGNOSIS — M4317 Spondylolisthesis, lumbosacral region: Secondary | ICD-10-CM | POA: Diagnosis not present

## 2023-06-29 DIAGNOSIS — R2689 Other abnormalities of gait and mobility: Secondary | ICD-10-CM | POA: Diagnosis not present

## 2023-06-29 DIAGNOSIS — M6281 Muscle weakness (generalized): Secondary | ICD-10-CM | POA: Diagnosis not present

## 2023-07-06 ENCOUNTER — Telehealth: Payer: Self-pay

## 2023-07-06 ENCOUNTER — Other Ambulatory Visit (HOSPITAL_COMMUNITY): Payer: Self-pay

## 2023-07-06 ENCOUNTER — Encounter: Payer: Self-pay | Admitting: Cardiology

## 2023-07-06 ENCOUNTER — Telehealth: Payer: Self-pay | Admitting: Pharmacist

## 2023-07-06 ENCOUNTER — Ambulatory Visit: Payer: Medicare Other | Attending: Cardiology | Admitting: Cardiology

## 2023-07-06 VITALS — BP 145/60 | HR 65 | Wt 194.8 lb

## 2023-07-06 DIAGNOSIS — R2689 Other abnormalities of gait and mobility: Secondary | ICD-10-CM | POA: Diagnosis not present

## 2023-07-06 DIAGNOSIS — I5042 Chronic combined systolic (congestive) and diastolic (congestive) heart failure: Secondary | ICD-10-CM | POA: Diagnosis not present

## 2023-07-06 DIAGNOSIS — M6281 Muscle weakness (generalized): Secondary | ICD-10-CM | POA: Diagnosis not present

## 2023-07-06 DIAGNOSIS — M4317 Spondylolisthesis, lumbosacral region: Secondary | ICD-10-CM | POA: Diagnosis not present

## 2023-07-06 MED ORDER — CARVEDILOL 12.5 MG PO TABS: 12.5000 mg | ORAL_TABLET | Freq: Two times a day (BID) | ORAL | 3 refills | Status: DC

## 2023-07-06 NOTE — Telephone Encounter (Signed)
[employer] called stating pt's PA for [facility] was denied and she would be faxing over the appeal and denial letter. Ph. number [phone]

## 2023-07-06 NOTE — Progress Notes (Signed)
PCP: Reubin Milan, MD Cardiology: Dr. Azucena Cecil HF Cardiology: Dr. Shirlee Latch  77 y.o.with history of HTN and osteoarthritis was found to have a cardiomyopathy in 4/24.  Patient reports 2 prior cardiac caths several years ago, one at [facility] and one at [facility].  Neither showed obstructive CAD.  She does have a strong family history of CAD (multiple siblings with MIs/CAD).  She never had echoes with prior workups per her report.  Patient was found to have a LBBB, so echo was ordered by Dr. Azucena Cecil.  Echo was done in 4/24 and showed EF 25-30%, moderate LV dilation, normal RV, and moderate MR.  Due to low EF, she was set up for LHC that was done in 5/24.  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  Patient was short of breath and was put on Bipap.  She was markedly hypertensive.  She was admitted overnight and diuresed.   Cardiac MRI in 7/24 showed normal LV size with EF 38%, septal-lateral dyssynchrony, RV EF 56%, no LGE, ECV 28%.    Patient returns for followup of CHF. Weight down 3 lbs.  She is mildly hypertensive today. Breathing has overall improved. Still has a hard time walking outside on very hot days.  Main complaint remains low back pain, getting PT for this.  No lightheadedness. No chest pain. No palpitations. She continues to sew as a hobby.  She has used Lasix only once since last appointment.   Labs (5/24): K 4.2, creatinine 1.2, LDL 107 Labs (6/24): BNP 75, K 4.8, creatinine 1.31 Labs (7/24): K 4.6, creatinine 1.0  PMH: 1. LBBB 2. CAD: LHC ([facility] in 2013) with 50% LAD stenosis.  - LHC (5/24): 30% pLAD, 40% mLAD.  3. Chronic systolic CHF: Nonischemic cardiomyopathy.  - Echo (4/24): EF 25-30%, normal RV, moderate MR.  - Cardiac MRI (7/24): normal LV size with EF 38%, septal-lateral dyssynchrony, RV EF 56%, no LGE, ECV 28%.  4. HTN 5. Meniere's disease 6. Lumbar spinal fusion 1/24 7. OA 8. Asthma  Social History   Socioeconomic History    Marital status: Married    Spouse name: Stann Ore   Number of children: 1   Years of education: Not on file   Highest education level: Not on file  Occupational History   Occupation: Part time  Tobacco Use   Smoking status: Former    Current packs/day: 0.00    Average packs/day: 1.5 packs/day for 30.0 years (45.0 ttl pk-yrs)    Types: Cigarettes    Start date: 11/12/1960    Quit date: 11/12/1990    Years since quitting: 32.6   Smokeless tobacco: Never  Vaping Use   Vaping status: Never Used  Substance and Sexual Activity   Alcohol use: Yes    Alcohol/week: 1.0 standard drink of alcohol    Types: 1 Standard drinks or equivalent per week    Comment: OCC [place]Y, maybe 1 drink a week   Drug use: No   Sexual activity: Not Currently  Other Topics Concern   Not on file  Social History Narrative   Not on file   Social Determinants of Health   Financial Resource Strain: Low Risk  (10/16/2022)   Overall Financial Resource Strain (CARDIA)    Difficulty of Paying Living Expenses: Not hard at all  Food Insecurity: No Food Insecurity (02/11/2023)   Hunger Vital Sign    Worried About Running Out of Food in the Last Year: Never true  Ran Out of Food in the Last Year: Never true  Transportation Needs: No Transportation Needs (02/11/2023)   PRAPARE - Administrator, Civil Service (Medical): No    Lack of Transportation (Non-Medical): No  Physical Activity: Insufficiently Active (10/16/2022)   Exercise Vital Sign    Days of Exercise per Week: 5 days    Minutes of Exercise per Session: 10 min  Stress: No Stress Concern Present (10/16/2022)   [employer] of Occupational Health - Occupational Stress Questionnaire    Feeling of Stress : Not at all  Social Connections: Moderately Integrated (10/16/2022)   Social Connection and Isolation Panel [NHANES]    Frequency of Communication with Friends and Family: More than three times a week    Frequency of Social  Gatherings with Friends and Family: Once a week    Attends Religious Services: Never    Database administrator or Organizations: No    Attends Engineer, structural: More than 4 times per year    Marital Status: Married  Catering manager Violence: Not At Risk (10/16/2022)   Humiliation, Afraid, Rape, and Kick questionnaire    Fear of Current or Ex-Partner: No    Emotionally Abused: No    Physically Abused: No    Sexually Abused: No   FH: Brother died at 51 of MI, brother died at 22 of MI, brother died in 2s of CVA, mother with CHF.   ROS: All systems reviewed and negative except as per HPI.   Current Outpatient Medications  Medication Sig Dispense Refill   acetaminophen (TYLENOL) 500 MG tablet Take 2 tablets (1,000 mg total) by mouth every 6 (six) hours as needed (DO NOT EXCEED 4000 MG DAILY FROM ALL SOURCES). 30 tablet 2   albuterol (VENTOLIN HFA) 108 (90 Base) MCG/ACT inhaler Inhale 2 puffs into the lungs every 6 (six) hours as needed for wheezing or shortness of breath. 18 g 1   alum hydroxide-mag trisilicate (GAVISCON) 80-20 MG CHEW chewable tablet Chew 1 tablet by mouth daily as needed for indigestion or heartburn.     carvedilol (COREG) 12.5 MG tablet Take 1 tablet (12.5 mg total) by mouth 2 (two) times daily. 180 tablet 3   cholecalciferol (VITAMIN D3) 25 MCG (1000 UNIT) tablet Take 1,000 Units by mouth at bedtime.     clonazePAM (KLONOPIN) 0.5 MG tablet Take 1 tablet (0.5 mg total) by mouth at bedtime as needed for anxiety. 30 tablet 0   dapagliflozin propanediol (FARXIGA) 10 MG TABS tablet Take 1 tablet (10 mg total) by mouth daily. 100 tablet 1   furosemide (LASIX) 20 MG tablet Take 1 tablet (20 mg total) by mouth daily as needed (swelling, shortness of breath, gain 3 lbs overnight or 5 lbs in a week). 30 tablet 3   gabapentin (NEURONTIN) 300 MG capsule TAKE 1 TO 2 CAPSULES BY MOUTH AT BEDTIME 180 capsule 0   meclizine (ANTIVERT) 12.5 MG tablet Take 12.5 mg by mouth 3  (three) times daily as needed for dizziness. Dr. Eduard Clos     Multiple Vitamin (MULTIVITAMIN) tablet Take 1 tablet by mouth every morning.     potassium chloride (KLOR-CON M) 10 MEQ tablet Take 1 tablet (10 mEq total) by mouth as needed (with lasix). 30 tablet 3   Probiotic Product (PROBIOTIC DAILY PO) Take 1 capsule by mouth daily.     rosuvastatin (CRESTOR) 20 MG tablet Take 1 tablet (20 mg total) by mouth daily. 30 tablet 5   sacubitril-valsartan (ENTRESTO) 97-103  MG Take 1 tablet by mouth 2 (two) times daily. 60 tablet 6   spironolactone (ALDACTONE) 25 MG tablet Take 1 tablet (25 mg total) by mouth daily. 90 tablet 3   traMADol (ULTRAM) 50 MG tablet Take 50 mg by mouth every 6 (six) hours as needed for moderate pain.     zolpidem (AMBIEN) 5 MG tablet TAKE 1 TABLET BY MOUTH AT BEDTIME 30 tablet 5   cephALEXin (KEFLEX) 500 MG capsule TAKE 4 PRIOR TO DENTAL PROCEDURES 4 capsule 1   No current facility-administered medications for this visit.   BP (!) 145/60   Pulse 65   Wt 194 lb 12.8 oz (88.4 kg) Comment: refused weight.pt stated this was weight at home.  SpO2 96%   BMI 41.42 kg/m  General: NAD, obese.  Neck: No JVD, no thyromegaly or thyroid nodule.  Lungs: Clear to auscultation bilaterally with normal respiratory effort. CV: Nondisplaced PMI.  Heart regular S1/S2, no S3/S4, no murmur.  No peripheral edema.  No carotid bruit.  Normal pedal pulses.  Abdomen: Soft, nontender, no hepatosplenomegaly, no distention.  Skin: Intact without lesions or rashes.  Neurologic: Alert and oriented x 3.  Psych: Normal affect. Extremities: No clubbing or cyanosis.  HEENT: Normal.   Assessment/Plan: 1. Chronic systolic CHF: Nonischemic cardiomyopathy.  No heavy ETOH/drugs.  No FH of dilated cardiomyopathy (has FH of CAD).  Patient was found to have LBBB, leading to echo in 4/24 showing EF 25-30%, moderate LV dilation, normal RV, and moderate MR.  Due to low EF, she was set up for LHC showing  nonobstructive CAD (40% mLAD stenosis).  Cardiac MRI in 7/24 showed normal LV size with EF 38%, septal-lateral dyssynchrony, RV EF 56%, no delayed enhancement, ECV 28%. She is not volume overloaded on exam, NYHA class II.    - Keep Lasix prn.  - Continue Entresto 97/103 bid.  - Continue spironolactone 25 mg daily. BMET/BNP today.  - Continue Farxiga 10 mg daily.  - Increase Coreg to 12.5 mg bid.  - She has LBBB 134 msec on last ECG, This does not predict significant benefit from CRT, but she is noted to have septal-lateral dyssynchrony on imaging.  For now, this is a moot point as her EF was > 35% by cardiac MRI.  2. CAD: Nonobstructive.  Strong FH of MI.  - Continue statin.  3. HTN: BP mildly elevated, increase Coreg today.  4. Asthma: Followed by pulmonary.  5. Obesity: I will see if she can get coverage for GLP-1 agonist.   Followup 3 months.    Marca Ancona 07/06/2023

## 2023-07-06 NOTE — Patient Instructions (Addendum)
Medication Changes:  Take Coreg 12.5 mg (1 tablet) 2 times  a day.   Lab Work:  Labs done today, your results will be available in MyChart, we will contact you for abnormal readings.   Special Instructions // Education:  Do the following things EVERYDAY: Weigh yourself in the morning before breakfast. Write it down and keep it in a log. Take your medicines as prescribed Eat low salt foods--Limit salt (sodium) to 2000 mg per day.  Stay as active as you can everyday Limit all fluids for the day to less than 2 liters   Follow-Up in: follow up in 3 months. Please call in December to schedule your January appointment with Dr. Shirlee Latch.     If you have any questions or concerns before your next appointment please send [place] a message through [place] or call our office at [phone] Monday-Friday 8 am-5 pm.   If you have an urgent need after hours on the weekend please call your Primary Cardiologist or the [facility] in [place] at [phone].

## 2023-07-06 NOTE — Telephone Encounter (Signed)
PA for [facility] submitted ([id number]). Ozempic does not require prior auth but shows a $243.53 copay for 30 days. Patient does not qualify for copay cards.

## 2023-07-07 LAB — HEMOGLOBIN A1C
Est. average glucose Bld gHb Est-mCnc: 111 mg/dL
Hgb A1c MFr Bld: 5.5 % (ref 4.8–5.6)

## 2023-07-07 LAB — BASIC METABOLIC PANEL
BUN/Creatinine Ratio: 27 (ref 12–28)
BUN: 22 mg/dL (ref 8–27)
CO2: 20 mmol/L (ref 20–29)
Calcium: 9.4 mg/dL (ref 8.7–10.3)
Chloride: 104 mmol/L (ref 96–106)
Creatinine, Ser: 0.81 mg/dL (ref 0.57–1.00)
Glucose: 89 mg/dL (ref 70–99)
Potassium: 4.3 mmol/L (ref 3.5–5.2)
Sodium: 140 mmol/L (ref 134–144)
eGFR: 76 mL/min/{1.73_m2} (ref >59)

## 2023-07-07 LAB — BRAIN NATRIURETIC PEPTIDE: BNP: 87.4 pg/mL (ref 0.0–100.0)

## 2023-07-07 LAB — TSH: TSH: 2.91 u[IU]/mL (ref 0.450–4.500)

## 2023-07-07 NOTE — Telephone Encounter (Signed)
PA for [facility] was denied because the patient does not have record of prior myocardial infarction, CVA, or PAD. The insurance company does not consider nonobstructive CAD as an indication for therapy. Discussed this with the patient and she will talk with her husband and get back with [place] on whether she wants to continue with the $243.53 copay or wait until semaglutide is approved for HFpEF.

## 2023-07-10 ENCOUNTER — Other Ambulatory Visit: Payer: Self-pay | Admitting: Internal Medicine

## 2023-07-12 NOTE — Telephone Encounter (Signed)
No longer listed on current medication list Requested Prescriptions  Pending Prescriptions Disp Refills   omeprazole (PRILOSEC) 20 MG capsule [Pharmacy Med Name: Omeprazole 20 MG Oral Capsule Delayed Release] 180 capsule 0    Sig: TAKE 1 CAPSULE BY MOUTH TWICE DAILY BEFORE A MEAL     Gastroenterology: Proton Pump Inhibitors Passed - 07/10/2023  6:51 AM      Passed - Valid encounter within last 12 months    Recent Outpatient Visits           3 months ago Annual physical exam   [place] [facility] at [facility], Nyoka Cowden, MD   5 months ago Allergic pharyngitis   [place] [facility] at [facility], Ocie Bob, MD   7 months ago Essential hypertension   [place] [facility] at [facility], Nyoka Cowden, MD   1 year ago Prediabetes   [facility] at [facility], Nyoka Cowden, MD   1 year ago Annual physical exam   [facility] at [facility], Nyoka Cowden, MD       Future Appointments             In 3 months Judithann Graves, Nyoka Cowden, MD [facility] at [facility], [facility]   In 9 months Judithann Graves, Nyoka Cowden, MD [facility] at [facility], [facility]

## 2023-07-13 DIAGNOSIS — M4317 Spondylolisthesis, lumbosacral region: Secondary | ICD-10-CM | POA: Diagnosis not present

## 2023-07-13 DIAGNOSIS — M6281 Muscle weakness (generalized): Secondary | ICD-10-CM | POA: Diagnosis not present

## 2023-07-13 DIAGNOSIS — R2689 Other abnormalities of gait and mobility: Secondary | ICD-10-CM | POA: Diagnosis not present

## 2023-07-16 ENCOUNTER — Other Ambulatory Visit: Payer: Self-pay | Admitting: Internal Medicine

## 2023-07-16 DIAGNOSIS — G2581 Restless legs syndrome: Secondary | ICD-10-CM

## 2023-07-26 DIAGNOSIS — M4317 Spondylolisthesis, lumbosacral region: Secondary | ICD-10-CM | POA: Diagnosis not present

## 2023-07-27 DIAGNOSIS — R2689 Other abnormalities of gait and mobility: Secondary | ICD-10-CM | POA: Diagnosis not present

## 2023-07-27 DIAGNOSIS — M6281 Muscle weakness (generalized): Secondary | ICD-10-CM | POA: Diagnosis not present

## 2023-07-27 DIAGNOSIS — M4317 Spondylolisthesis, lumbosacral region: Secondary | ICD-10-CM | POA: Diagnosis not present

## 2023-07-29 ENCOUNTER — Other Ambulatory Visit: Payer: Self-pay | Admitting: Internal Medicine

## 2023-07-29 DIAGNOSIS — F5101 Primary insomnia: Secondary | ICD-10-CM

## 2023-07-30 ENCOUNTER — Encounter: Payer: Self-pay | Admitting: Internal Medicine

## 2023-07-30 NOTE — Telephone Encounter (Signed)
Please review.  KP

## 2023-08-11 ENCOUNTER — Telehealth: Payer: Self-pay | Admitting: Pharmacist

## 2023-08-12 ENCOUNTER — Ambulatory Visit: Payer: Medicare Other | Attending: Pulmonary Disease

## 2023-08-12 ENCOUNTER — Encounter: Payer: Self-pay | Admitting: Pulmonary Disease

## 2023-08-12 ENCOUNTER — Ambulatory Visit: Payer: Medicare Other | Admitting: Pulmonary Disease

## 2023-08-12 VITALS — BP 136/78 | HR 63 | Temp 97.6°F | Ht 59.0 in | Wt 194.8 lb

## 2023-08-12 DIAGNOSIS — J4520 Mild intermittent asthma, uncomplicated: Secondary | ICD-10-CM

## 2023-08-12 DIAGNOSIS — J45909 Unspecified asthma, uncomplicated: Secondary | ICD-10-CM | POA: Insufficient documentation

## 2023-08-12 DIAGNOSIS — J4530 Mild persistent asthma, uncomplicated: Secondary | ICD-10-CM | POA: Diagnosis not present

## 2023-08-12 DIAGNOSIS — Z87891 Personal history of nicotine dependence: Secondary | ICD-10-CM | POA: Insufficient documentation

## 2023-08-12 DIAGNOSIS — R0602 Shortness of breath: Secondary | ICD-10-CM

## 2023-08-12 LAB — PULMONARY FUNCTION TEST ARMC ONLY
DL/VA % pred: 79 %
DL/VA: 3.36 ml/min/mmHg/L
DLCO unc % pred: 70 %
DLCO unc: 11.66 ml/min/mmHg
FEF 25-75 Post: 1.77 L/s
FEF 25-75 Pre: 1.39 L/s
FEF2575-%Change-Post: 27 %
FEF2575-%Pred-Post: 123 %
FEF2575-%Pred-Pre: 96 %
FEV1-%Change-Post: 7 %
FEV1-%Pred-Post: 90 %
FEV1-%Pred-Pre: 84 %
FEV1-Post: 1.58 L
FEV1-Pre: 1.47 L
FEV1FVC-%Change-Post: 0 %
FEV1FVC-%Pred-Pre: 106 %
FEV6-%Change-Post: 7 %
FEV6-%Pred-Post: 89 %
FEV6-%Pred-Pre: 83 %
FEV6-Post: 1.98 L
FEV6-Pre: 1.84 L
FEV6FVC-%Pred-Post: 105 %
FEV6FVC-%Pred-Pre: 105 %
FVC-%Change-Post: 7 %
FVC-%Pred-Post: 84 %
FVC-%Pred-Pre: 79 %
FVC-Post: 1.98 L
FVC-Pre: 1.85 L
Post FEV1/FVC ratio: 80 %
Post FEV6/FVC ratio: 100 %
Pre FEV1/FVC ratio: 80 %
Pre FEV6/FVC Ratio: 100 %
RV % pred: 106 %
RV: 2.22 L
TLC % pred: 100 %
TLC: 4.46 L

## 2023-08-12 LAB — NITRIC OXIDE: Nitric Oxide: 11

## 2023-08-12 MED ORDER — ALBUTEROL SULFATE (2.5 MG/3ML) 0.083% IN NEBU
2.5000 mg | INHALATION_SOLUTION | Freq: Once | RESPIRATORY_TRACT | Status: AC
  Administered 2023-08-12: 2.5 mg via RESPIRATORY_TRACT
  Filled 2023-08-12: qty 3

## 2023-08-12 MED ORDER — BUDESONIDE-FORMOTEROL FUMARATE 160-4.5 MCG/ACT IN AERO
2.0000 | INHALATION_SPRAY | Freq: Two times a day (BID) | RESPIRATORY_TRACT | 12 refills | Status: AC
Start: 2023-08-12 — End: ?

## 2023-08-12 NOTE — Progress Notes (Signed)
Synopsis: Referred in by Reubin Milan, MD   Subjective:   PATIENT ID: Katrina Poole GENDER: female DOB: [birth year], MRN: [id number]  Chief Complaint  Patient presents with   Follow-up    No cough, shortness of breath or wheezing.     HPI Ms. 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   Seen initially in August 2024; She has been complaining of shortness of breaht specifically on exertion and going uphill for years. She does report intermittent productive cough. She has hypersensitivity to perfumes but not so much cold air or dust. She was given the diagnosis of asthma early 2000s and has been on albuterol prn which helps. Now with the recent diagnosis of NICM she unsure what is causing her shortness of breath. She never required hospitalization for asthma nor has she required systemic steroids.   Family History: Mother with COPD (Smoker)   Social History - Quit smoking 30 years ago, smoked 1 and half pack per day for 20 years. Occasional alcohol use. No illicit drug use. Works as Corporate treasurer , no environmental exposures. No pets at home.   ROS All systems were reviewed and are negative except for the above.  Objective:   Vitals:   08/12/23 1440  BP: 136/78  Pulse: 63  Temp: 97.6 F (36.4 C)  TempSrc: Temporal  SpO2: 97%  Weight: 194 lb 12.8 oz (88.4 kg)  Height: 4\' 11"  (1.499 m)   97% on RA BMI Readings from Last 3 Encounters:  08/12/23 39.34 kg/m  07/06/23 41.42 kg/m  05/12/23 42.10 kg/m   Wt Readings from Last 3 Encounters:  08/12/23 194 lb 12.8 oz (88.4 kg)  07/06/23 194 lb 12.8 oz (88.4 kg)  05/12/23 198 lb (89.8 kg)    Physical Exam GEN: NAD, Healthy Appearing HEENT: Supple Neck, Reactive Pupils, EOMI   CVS: Normal S1, Normal S2, RRR, No murmurs or ES appreciated  Lungs: Clear bilateral air entry.  Abdomen: Soft, non tender, non distended, + BS  Extremities: Warm and well perfused, No edema  Skin: No suspicious lesions appreciated  Psych: Normal Affect  Ancillary Information   CBC    Component Value Date/Time   WBC 8.4 02/08/2023 1327   RBC 4.12 02/08/2023 1327   HGB 10.8 (L) 03/04/2023 1139   HGB 11.5 10/23/2021 0944   HCT 33.5 (L) 03/04/2023 1139   HCT 33.9 (L) 10/23/2021 0944   PLT 292 02/08/2023 1327   PLT 282 10/23/2021 0944   MCV 86.2 02/08/2023 1327   MCV 89 10/23/2021 0944   MCH 27.2 02/08/2023 1327   MCHC 31.5 02/08/2023 1327   RDW 16.0 (H) 02/08/2023 1327   RDW 13.1 10/23/2021 0944   LYMPHSABS 1.1 02/16/2022 2209   LYMPHSABS 1.0 10/23/2021 0944   MONOABS 0.7 02/16/2022 2209   EOSABS 0.4 02/16/2022 2209   EOSABS 0.1 10/23/2021 0944   BASOSABS 0.0 02/16/2022 2209   BASOSABS 0.1 10/23/2021 0944    Imaging   PFTs  08/12/2023 FEV1 normal at 1.47 L within normal limits, FVC at 1.85 L also within normal limits FEV1 to FVC ratio normal at 80.  No significant change postbronchodilator.  However there was somewhat observed changed with FEV1 by 0.11 L +7% change as well as FVC at 0.13 L +7% change.  Total lung volume was normal at 4.46 L 100% of predicted RV was slightly elevated at 106% of predicted possibly showing some air trapping.  DLCO was mildly reduced at 70% of predicted however when corrected to alveolar ventilation at 78% of predicted.  CXR May 2024: Diffuse interstitial and alveolar opacities consistent with pulmonary edema.  Echocardiogram 04/19:   1. Left ventricular ejection fraction, by estimation, is 25 to 30%. Left  ventricular ejection fraction by 2D MOD biplane is 30.8 %. The left  ventricle has severely decreased function. The left ventricle demonstrates  global hypokinesis. The left  ventricular internal cavity size was moderately dilated. Left  ventricular  diastolic parameters are consistent with Grade I diastolic dysfunction  (impaired relaxation). The average left ventricular global longitudinal  strain is -12.3 %.   2. Right ventricular systolic function is normal. The right ventricular  size is normal. Tricuspid regurgitation signal is inadequate for assessing  PA pressure.   3. Left atrial size was moderately dilated.   4. The mitral valve is normal in structure. Moderate mitral valve  regurgitation. No evidence of mitral stenosis.   5. The aortic valve is normal in structure. Aortic valve regurgitation is  not visualized. No aortic stenosis is present.   6. The inferior vena cava is normal in size with greater than 50%  respiratory variability, suggesting right atrial pressure of 3 mmHg.        Latest Ref Rng & Units 08/12/2023    1:32 PM  PFT Results  FVC-Pre L 1.85  P  FVC-Predicted Pre % 79  P  FVC-Post L 1.98  P  FVC-Predicted Post % 84  P  Pre FEV1/FVC % % 80  P  Post FEV1/FCV % % 80  P  FEV1-Pre L 1.47  P  FEV1-Predicted Pre % 84  P  FEV1-Post L 1.58  P  DLCO uncorrected ml/min/mmHg 11.66  P  DLCO UNC% % 70  P  DLVA Predicted % 79  P  TLC L 4.46  P  TLC % Predicted % 100  P  RV % Predicted % 106  P    P Preliminary result     Assessment & Plan:  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   # Exertional shortness of breath. # Mild intermittent asthma (FENO 11) # Recent diagnosis of nonischemic cardiomyopathy with EF 25 to 30% 04/19 [facility] 02/2023 w/ NOCAD #GERD #Morbid Obesity  Her dyspnea on exertion could be multifactorial in the setting of Non ischemic CM, moderate persistent asthma, Morbid Obesity and GERD.   We discussed the importance of weightloss and its impact  on asthma and multiple other comorbidities. I think she would highly benefit from weight loss strategies including Ozempic. I advised her to discuss that with her PCP.   []  Start budesonide-Formoterol [Symbicort] 160-4.5 2 puffs BID.  []  C/w Albuterol as needed  []  C/w PPI []  CXR for today.  []  Discuss with PCP weightloss strategies and possibly Ozempic.   Return in about 6 months (around 02/09/2024).  I spent 30 minutes caring for this patient today, including preparing to see the patient, obtaining a medical history , reviewing a separately obtained history, performing a medically appropriate examination and/or evaluation, counseling and educating the patient/family/caregiver, ordering medications, tests, or procedures, documenting clinical information in the  electronic health record, and independently interpreting results (not separately reported/billed) and communicating results to the patient/family/caregiver  Janann Colonel, MD [facility] 08/12/2023 5:51 PM

## 2023-08-21 DIAGNOSIS — M1811 Unilateral primary osteoarthritis of first carpometacarpal joint, right hand: Secondary | ICD-10-CM | POA: Diagnosis not present

## 2023-08-23 ENCOUNTER — Ambulatory Visit
Admission: RE | Admit: 2023-08-23 | Discharge: 2023-08-23 | Disposition: A | Discharge status: Home / Self Care | Payer: Medicare Other | Source: Ambulatory Visit | Attending: Pulmonary Disease | Admitting: Pulmonary Disease

## 2023-08-23 ENCOUNTER — Other Ambulatory Visit
Admission: RE | Admit: 2023-08-23 | Discharge: 2023-08-23 | Disposition: A | Discharge status: Home / Self Care | Payer: Medicare Other | Source: Ambulatory Visit | Attending: Pulmonary Disease | Admitting: Pulmonary Disease

## 2023-08-23 DIAGNOSIS — J4530 Mild persistent asthma, uncomplicated: Secondary | ICD-10-CM | POA: Insufficient documentation

## 2023-08-23 DIAGNOSIS — R0602 Shortness of breath: Secondary | ICD-10-CM | POA: Diagnosis not present

## 2023-08-23 DIAGNOSIS — I700 Atherosclerosis of aorta: Secondary | ICD-10-CM | POA: Diagnosis not present

## 2023-08-23 DIAGNOSIS — I771 Stricture of artery: Secondary | ICD-10-CM | POA: Diagnosis not present

## 2023-08-23 LAB — CBC WITH DIFFERENTIAL/PLATELET
Abs Immature Granulocytes: 0.02 10*3/uL (ref 0.00–0.07)
Basophils Absolute: 0.1 10*3/uL (ref 0.0–0.1)
Basophils Relative: 1 %
Eosinophils Absolute: 0.2 10*3/uL (ref 0.0–0.5)
Eosinophils Relative: 3 %
HCT: 36.9 % (ref 36.0–46.0)
Hemoglobin: 12.2 g/dL (ref 12.0–15.0)
Immature Granulocytes: 0 %
Lymphocytes Relative: 20 %
Lymphs Abs: 1.6 10*3/uL (ref 0.7–4.0)
MCH: 31.1 pg (ref 26.0–34.0)
MCHC: 33.1 g/dL (ref 30.0–36.0)
MCV: 94.1 fL (ref 80.0–100.0)
Monocytes Absolute: 0.7 10*3/uL (ref 0.1–1.0)
Monocytes Relative: 8 %
Neutro Abs: 5.4 10*3/uL (ref 1.7–7.7)
Neutrophils Relative %: 68 %
Platelets: 292 10*3/uL (ref 150–400)
RBC: 3.92 MIL/uL (ref 3.87–5.11)
RDW: 12.8 % (ref 11.5–15.5)
WBC: 7.9 10*3/uL (ref 4.0–10.5)
nRBC: 0 % (ref 0.0–0.2)

## 2023-08-27 ENCOUNTER — Encounter: Payer: Self-pay | Admitting: Internal Medicine

## 2023-08-27 ENCOUNTER — Other Ambulatory Visit: Payer: Self-pay | Admitting: Internal Medicine

## 2023-08-27 DIAGNOSIS — F5101 Primary insomnia: Secondary | ICD-10-CM

## 2023-08-27 MED ORDER — ZOLPIDEM TARTRATE 5 MG PO TABS: 5.0000 mg | ORAL_TABLET | Freq: Every day | ORAL | 5 refills | Status: DC

## 2023-09-07 ENCOUNTER — Encounter: Payer: Self-pay | Admitting: Internal Medicine

## 2023-09-07 ENCOUNTER — Other Ambulatory Visit: Payer: Self-pay | Admitting: Internal Medicine

## 2023-09-07 DIAGNOSIS — K219 Gastro-esophageal reflux disease without esophagitis: Secondary | ICD-10-CM

## 2023-09-07 MED ORDER — OMEPRAZOLE 20 MG PO CPDR
20.0000 mg | DELAYED_RELEASE_CAPSULE | Freq: Two times a day (BID) | ORAL | 1 refills | Status: DC
Start: 2023-09-07 — End: 2023-10-12

## 2023-09-07 NOTE — Telephone Encounter (Signed)
Please review.  KP

## 2023-09-10 ENCOUNTER — Encounter: Payer: Self-pay | Admitting: Pulmonary Disease

## 2023-09-15 DIAGNOSIS — M1811 Unilateral primary osteoarthritis of first carpometacarpal joint, right hand: Secondary | ICD-10-CM | POA: Diagnosis not present

## 2023-09-24 ENCOUNTER — Telehealth: Payer: Self-pay | Admitting: Cardiology

## 2023-09-24 DIAGNOSIS — I5042 Chronic combined systolic (congestive) and diastolic (congestive) heart failure: Secondary | ICD-10-CM

## 2023-09-24 DIAGNOSIS — I2510 Atherosclerotic heart disease of native coronary artery without angina pectoris: Secondary | ICD-10-CM

## 2023-09-24 MED ORDER — ROSUVASTATIN CALCIUM 20 MG PO TABS
20.0000 mg | ORAL_TABLET | Freq: Every day | ORAL | 3 refills | Status: DC
Start: 2023-09-24 — End: 2023-10-15

## 2023-09-24 NOTE — Telephone Encounter (Signed)
Refill sent in as requested. 

## 2023-10-08 ENCOUNTER — Telehealth: Payer: Self-pay | Admitting: Cardiology

## 2023-10-08 ENCOUNTER — Other Ambulatory Visit: Payer: Self-pay

## 2023-10-08 MED ORDER — SACUBITRIL-VALSARTAN 97-103 MG PO TABS: 1.0000 | ORAL_TABLET | Freq: Two times a day (BID) | ORAL | 11 refills | Status: DC

## 2023-10-12 ENCOUNTER — Ambulatory Visit (INDEPENDENT_AMBULATORY_CARE_PROVIDER_SITE_OTHER): Payer: Medicare Other | Admitting: Internal Medicine

## 2023-10-12 ENCOUNTER — Encounter: Payer: Self-pay | Admitting: Internal Medicine

## 2023-10-12 VITALS — BP 112/68 | HR 60 | Ht 59.0 in | Wt 193.4 lb

## 2023-10-12 DIAGNOSIS — F5101 Primary insomnia: Secondary | ICD-10-CM | POA: Diagnosis not present

## 2023-10-12 DIAGNOSIS — G2581 Restless legs syndrome: Secondary | ICD-10-CM | POA: Diagnosis not present

## 2023-10-12 DIAGNOSIS — Z1231 Encounter for screening mammogram for malignant neoplasm of breast: Secondary | ICD-10-CM | POA: Diagnosis not present

## 2023-10-12 DIAGNOSIS — K219 Gastro-esophageal reflux disease without esophagitis: Secondary | ICD-10-CM | POA: Diagnosis not present

## 2023-10-12 DIAGNOSIS — I10 Essential (primary) hypertension: Secondary | ICD-10-CM

## 2023-10-12 DIAGNOSIS — I5022 Chronic systolic (congestive) heart failure: Secondary | ICD-10-CM

## 2023-10-12 MED ORDER — ZOLPIDEM TARTRATE 5 MG PO TABS: 5.0000 mg | ORAL_TABLET | Freq: Every day | ORAL | 1 refills | Status: DC

## 2023-10-12 MED ORDER — OMEPRAZOLE 20 MG PO CPDR: 20.0000 mg | DELAYED_RELEASE_CAPSULE | Freq: Two times a day (BID) | ORAL | 1 refills | Status: DC

## 2023-10-12 MED ORDER — CLONAZEPAM 0.5 MG PO TABS
0.5000 mg | ORAL_TABLET | Freq: Every evening | ORAL | 0 refills | Status: DC | PRN
Start: 2023-10-12 — End: 2024-01-06

## 2023-10-12 MED ORDER — GABAPENTIN 300 MG PO CAPS: 600.0000 mg | ORAL_CAPSULE | Freq: Every day | ORAL | 1 refills | Status: DC

## 2023-10-12 NOTE — Progress Notes (Signed)
 Refill sent in

## 2023-10-12 NOTE — Assessment & Plan Note (Signed)
 Doing well on Ambien nightly. Taking 1/2 clonazepam as needed for severe restlessness with back pain

## 2023-10-12 NOTE — Assessment & Plan Note (Signed)
 Reflux symptoms are minimal on current therapy - omeprazole bid. No red flag signs such as weight loss, n/v, melena

## 2023-10-12 NOTE — Progress Notes (Signed)
 Date:  10/12/2023   Name:  Katrina Poole   DOB:  Nov 14, 1945   MRN:  [id number]   Chief Complaint: Hypertension  Hypertension This is a chronic problem. The problem is controlled. Pertinent negatives include no chest pain, headaches, palpitations or shortness of breath. Past treatments include beta blockers, angiotensin blockers and diuretics (Entresto ). Hypertensive end-organ damage includes CAD/MI and heart failure. There is no history of kidney disease or CVA.  Gastroesophageal Reflux She complains of heartburn. She reports no abdominal pain, no chest pain, no coughing or no wheezing. This is a recurrent problem. The problem occurs rarely. Pertinent negatives include no fatigue. She has tried a PPI for the symptoms.  RLS - on gabapentin  nightly, take clonazepam  as well if symptoms are severe. HFrEF - stable on medications, followed by cardiology.  Review of Systems  Constitutional:  Negative for fatigue and unexpected weight change.  HENT:  Negative for nosebleeds.   Eyes:  Negative for visual disturbance.  Respiratory:  Negative for cough, chest tightness, shortness of breath and wheezing.   Cardiovascular:  Negative for chest pain, palpitations and leg swelling.  Gastrointestinal:  Positive for heartburn. Negative for abdominal pain, constipation and diarrhea.  Neurological:  Negative for dizziness, weakness, light-headedness and headaches.     Lab Results  Component Value Date   NA 140 07/06/2023   K 4.3 07/06/2023   CO2 20 07/06/2023   GLUCOSE 89 07/06/2023   BUN 22 07/06/2023   CREATININE 0.81 07/06/2023   CALCIUM  9.4 07/06/2023   EGFR 76 07/06/2023   GFRNONAA 52 (L) 04/16/2023   Lab Results  Component Value Date   CHOL 184 02/09/2023   HDL 61 02/09/2023   LDLCALC 107 (H) 02/09/2023   TRIG 81 02/09/2023   CHOLHDL 3.0 02/09/2023   Lab Results  Component Value Date   TSH 2.910 07/06/2023   Lab Results  Component Value Date   HGBA1C 5.5 07/06/2023   Lab  Results  Component Value Date   WBC 7.9 08/23/2023   HGB 12.2 08/23/2023   HCT 36.9 08/23/2023   MCV 94.1 08/23/2023   PLT 292 08/23/2023   Lab Results  Component Value Date   ALT 14 04/16/2023   AST 17 04/16/2023   ALKPHOS 81 04/16/2023   BILITOT 0.3 04/16/2023   No results found for: MARIEN BOLLS, VD25OH   Patient Active Problem List   Diagnosis Date Noted   Cardiomyopathy (HCC) 02/08/2023   Chronic HFrEF (heart failure with reduced ejection fraction) (HCC) 02/08/2023   CAD (coronary artery disease) 02/08/2023   Allergic pharyngitis 01/14/2023   Left bundle branch block 11/18/2022   DDD (degenerative disc disease), lumbar 10/09/2022   Lumbar spondylosis 09/17/2022   Prediabetes 04/22/2022   Spinal stenosis at L4-L5 level 02/09/2022   Lumbar radiculopathy 07/21/2021   Heart palpitations 07/01/2021   Mixed hyperlipidemia 07/01/2021   Generalized anxiety disorder 07/01/2021   Body mass index (BMI) 40.0-44.9, adult (HCC) 06/12/2021   Essential hypertension 08/16/2020   Meniere's disease 08/16/2020   Restless leg syndrome 08/16/2020   Primary insomnia 08/16/2020   Mild intermittent asthma without complication 08/16/2020   Diverticulosis of colon 08/16/2020   HNP (herniated nucleus pulposus), lumbar 04/29/2020   Gastroesophageal reflux disease without esophagitis 01/24/2020   Spondylosis of cervical spine 03/16/2018   Osteoarthritis of knee 02/06/2018   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     Sulfacetamide    Beef-Derived Drug Products Other (See Comments)    Tears up my stomach.    Past Surgical History:  Procedure Laterality Date   ABDOMINAL EXPOSURE N/A 10/09/2022   Procedure: ABDOMINAL EXPOSURE;  Surgeon:  Gretta Lonni PARAS, MD;  Location: [facility] OR;  Service: Vascular;  Laterality: N/A;   ANTERIOR CERVICAL DECOMP/DISCECTOMY FUSION N/A 03/16/2018   Procedure: Anterior Cervical Discectomy Fusion - Cervical Three- Cervical Four, Cervical Four- Cervical Five, Cervical Five-Cervical Six, Cervical Six- Cerival Seven;  Surgeon: Onetha Kuba, MD;  Location: [facility] OR;  Service: Neurosurgery;  Laterality: N/A;  Anterior Cervical Discectomy Fusion - Cervical Three- Cervical Four, Cervical Four- Cervical Five, Cervical Five-Cervical Six, Cervical Six- Ceriva   ANTERIOR LUMBAR FUSION N/A 10/09/2022   Procedure: Anteriorr Lumbar Interbody Fusion - Level Five-Sacral One  with posterior cortical screw augmentation and decompressive laminotomy on the right;  Surgeon: Onetha Kuba, MD;  Location: [facility] OR;  Service: Neurosurgery;  Laterality: N/A;   BUNIONECTOMY     Left x1, Right x2.   CARDIAC CATHETERIZATION     x2 both normal   CARPAL TUNNEL RELEASE Right    COLONOSCOPY     COLONOSCOPY WITH PROPOFOL  N/A 06/04/2016   Procedure: COLONOSCOPY WITH PROPOFOL ;  Surgeon: Gladis RAYMOND Mariner, MD;  Location: [facility] ENDOSCOPY;  Service: Endoscopy;  Laterality: N/A;   DILATION AND CURETTAGE OF UTERUS     ECTOPIC PREGNANCY SURGERY     ESOPHAGOGASTRODUODENOSCOPY (EGD) WITH PROPOFOL  N/A 06/04/2016   Procedure: ESOPHAGOGASTRODUODENOSCOPY (EGD) WITH PROPOFOL ;  Surgeon: Gladis RAYMOND Mariner, MD;  Location: [facility] ENDOSCOPY;  Service: Endoscopy;  Laterality: N/A;   exploratory laparoscopy     infertility surgery   HALLUX VALGUS CORRECTION Bilateral    JOINT REPLACEMENT Bilateral    partial knee   KNEE ARTHROPLASTY Left 01/27/2021   Procedure: CONVERSION OF LEFT PARTIAL KNEE TO LEFT TOTAL KNEE.;  Surgeon: Mardee Lynwood SQUIBB, MD;  Location: [facility] ORS;  Service: Orthopedics;  Laterality: Left;   KNEE ARTHROSCOPY Left 07/19/2015   Procedure: Arthroscopic limited synovectomy along with lateral compartment and retropatellar chondral debridement plus removal  of several small loose bodies;  Surgeon: Helayne Glenn, MD;  Location: [facility];  Service: Orthopedics;  Laterality: Left;  1ST CASE PER CECE   LAMINECTOMY WITH POSTERIOR LATERAL ARTHRODESIS LEVEL 2 N/A 10/09/2022   Procedure: LAMINECTOMY WITH POSTERIOR LATERAL ARTHRODESIS LEVEL TWO;  Surgeon: Onetha Kuba, MD;  Location: [facility] OR;  Service: Neurosurgery;  Laterality: N/A;   LEFT HEART CATH AND CORONARY ANGIOGRAPHY Left 02/08/2023   Procedure: LEFT HEART CATH AND CORONARY ANGIOGRAPHY;  Surgeon: Darron Deatrice LABOR, MD;  Location: [facility] INVASIVE CV LAB;  Service: Cardiovascular;  Laterality: Left;   LUMBAR LAMINECTOMY/DECOMPRESSION MICRODISCECTOMY Right 04/29/2020   Procedure: Microdiscectomy - Lumbar Three-Lumbar Four - Lumbar Four-Lumbar Five - right;  Surgeon: Onetha Kuba, MD;  Location: [facility] OR;  Service: Neurosurgery;  Laterality: Right;  Microdiscectomy - Lumbar Three-Lumbar Four - Lumbar Four-Lumbar Five - right   MEDIAL PARTIAL KNEE REPLACEMENT Bilateral    POSTERIOR LUMBAR FUSION  02/2022   L3-4 and L4-5   SHOULDER SURGERY Right    TOTAL HIP ARTHROPLASTY Left 11/25/2015   Procedure: TOTAL HIP ARTHROPLASTY;  Surgeon: Lynwood SQUIBB Mardee, MD;  Location: [facility] ORS;  Service: Orthopedics;  Laterality: Left;   TRIGGER FINGER RELEASE Left     Social  History   Tobacco Use   Smoking status: Former    Current packs/day: 0.00    Average packs/day: 1.5 packs/day for 30.0 years (45.0 ttl pk-yrs)    Types: Cigarettes    Start date: 11/12/1960    Quit date: 11/12/1990    Years since quitting: 32.9   Smokeless tobacco: Never  Vaping Use   Vaping status: Never Used  Substance Use Topics   Alcohol use: Yes    Alcohol/week: 1.0 standard drink of alcohol    Types: 1 Standard drinks or equivalent per week    Comment: OCC [place], maybe 1 drink a week   Drug use: No     Medication list has been reviewed and updated.  Current Meds  Medication Sig   acetaminophen  (TYLENOL ) 500 MG tablet Take 2  tablets (1,000 mg total) by mouth every 6 (six) hours as needed (DO NOT EXCEED 4000 MG DAILY FROM ALL SOURCES).   albuterol  (VENTOLIN  HFA) 108 (90 Base) MCG/ACT inhaler Inhale 2 puffs into the lungs every 6 (six) hours as needed for wheezing or shortness of breath.   alum hydroxide-mag trisilicate (GAVISCON ) 80-20 MG CHEW chewable tablet Chew 1 tablet by mouth daily as needed for indigestion or heartburn.   budesonide -formoterol  (SYMBICORT ) 160-4.5 MCG/ACT inhaler Inhale 2 puffs into the lungs in the morning and at bedtime.   cephALEXin  (KEFLEX ) 500 MG capsule TAKE 4 PRIOR TO DENTAL PROCEDURES   cholecalciferol  (VITAMIN D3) 25 MCG (1000 UNIT) tablet Take 1,000 Units by mouth at bedtime.   dapagliflozin  propanediol (FARXIGA ) 10 MG TABS tablet Take 1 tablet (10 mg total) by mouth daily.   furosemide  (LASIX ) 20 MG tablet Take 1 tablet (20 mg total) by mouth daily as needed (swelling, shortness of breath, gain 3 lbs overnight or 5 lbs in a week).   meclizine  (ANTIVERT ) 12.5 MG tablet Take 12.5 mg by mouth 3 (three) times daily as needed for dizziness. Dr. Salli   Multiple Vitamin (MULTIVITAMIN) tablet Take 1 tablet by mouth every morning.   potassium chloride  (KLOR-CON  M) 10 MEQ tablet Take 1 tablet (10 mEq total) by mouth as needed (with lasix ).   Probiotic Product (PROBIOTIC DAILY PO) Take 1 capsule by mouth daily.   rosuvastatin  (CRESTOR ) 20 MG tablet Take 1 tablet (20 mg total) by mouth daily.   sacubitril -valsartan  (ENTRESTO ) 97-103 MG Take 1 tablet by mouth 2 (two) times daily.   spironolactone  (ALDACTONE ) 25 MG tablet Take 1 tablet (25 mg total) by mouth daily.   traMADol  (ULTRAM ) 50 MG tablet Take 50 mg by mouth every 6 (six) hours as needed for moderate pain.   [DISCONTINUED] clonazePAM  (KLONOPIN ) 0.5 MG tablet Take 1 tablet (0.5 mg total) by mouth at bedtime as needed for anxiety.   [DISCONTINUED] gabapentin  (NEURONTIN ) 300 MG capsule TAKE 1 TO 2 CAPSULES BY MOUTH AT BEDTIME   [DISCONTINUED]  omeprazole  (PRILOSEC) 20 MG capsule Take 1 capsule (20 mg total) by mouth 2 (two) times daily before a meal.   [DISCONTINUED] zolpidem  (AMBIEN ) 5 MG tablet Take 1 tablet (5 mg total) by mouth at bedtime.       10/12/2023    9:18 AM 04/13/2023    8:13 AM 11/18/2022    8:58 AM 04/22/2022    8:30 AM  GAD 7 : Generalized Anxiety Score  Nervous, Anxious, on Edge 0 0 0 1  Control/stop worrying 0 0 0 1  Worry too much - different things 0 1 0 1  Trouble relaxing 0 1 0  0  Restless 0 1 0 1  Easily annoyed or irritable 0 1 0 2  Afraid - awful might happen 0 0 0 0  Total GAD 7 Score 0 4 0 6  Anxiety Difficulty Not difficult at all Not difficult at all Not difficult at all Not difficult at all       10/12/2023    9:17 AM 04/13/2023    8:12 AM 11/18/2022    8:58 AM  Depression screen PHQ 2/9  Decreased Interest 0 1 0  Down, Depressed, Hopeless 0 0 0  PHQ - 2 Score 0 1 0  Altered sleeping 0 2 0  Tired, decreased energy 0 1 0  Change in appetite 0 1 0  Feeling bad or failure about yourself  0 0 0  Trouble concentrating 0 0 0  Moving slowly or fidgety/restless 0 1 0  Suicidal thoughts 0 0 0  PHQ-9 Score 0 6 0  Difficult doing work/chores Not difficult at all Not difficult at all Not difficult at all    BP Readings from Last 3 Encounters:  10/12/23 112/68  08/12/23 136/78  07/06/23 (!) 145/60    Physical Exam Vitals and nursing note reviewed.  Constitutional:      General: She is not in acute distress.    Appearance: She is well-developed. She is obese.  HENT:     Head: Normocephalic and atraumatic.  Neck:     Vascular: No carotid bruit.  Cardiovascular:     Rate and Rhythm: Normal rate and regular rhythm.     Heart sounds: No murmur heard. Pulmonary:     Effort: Pulmonary effort is normal. No respiratory distress.     Breath sounds: No wheezing or rhonchi.  Musculoskeletal:     Cervical back: Normal range of motion.     Right lower leg: No edema.     Left lower leg: No edema.   Lymphadenopathy:     Cervical: No cervical adenopathy.  Skin:    General: Skin is warm and dry.     Findings: No rash.  Neurological:     General: No focal deficit present.     Mental Status: She is alert and oriented to person, place, and time.  Psychiatric:        Mood and Affect: Mood normal.        Behavior: Behavior normal.     Wt Readings from Last 3 Encounters:  10/12/23 193 lb 6.4 oz (87.7 kg)  08/12/23 194 lb 12.8 oz (88.4 kg)  07/06/23 194 lb 12.8 oz (88.4 kg)    BP 112/68   Pulse 60   Ht 4' 11 (1.499 m)   Wt 193 lb 6.4 oz (87.7 kg)   SpO2 95%   BMI 39.06 kg/m   Assessment and Plan:  Problem List Items Addressed This Visit       Unprioritized   Gastroesophageal reflux disease without esophagitis (Chronic)   Reflux symptoms are minimal on current therapy - omeprazole  bid. No red flag signs such as weight loss, n/v, melena       Relevant Medications   omeprazole  (PRILOSEC) 20 MG capsule   Essential hypertension - Primary (Chronic)   Controlled BP with normal exam. Current regimen is Entresto , Coreg , spironolactone . Will continue same medications; encourage continued reduced sodium diet.       Restless leg syndrome (Chronic)   Relevant Medications   gabapentin  (NEURONTIN ) 300 MG capsule   clonazePAM  (KLONOPIN ) 0.5 MG tablet   Primary insomnia  Doing well on Ambien  nightly. Taking 1/2 clonazepam  as needed for severe restlessness with back pain      Relevant Medications   zolpidem  (AMBIEN ) 5 MG tablet   Chronic HFrEF (heart failure with reduced ejection fraction) (HCC)   Doing well on current therapy Sees Cardiology this week.       Other Visit Diagnoses       Encounter for screening mammogram for breast cancer       order for Mammogram at [facility] given       Return in about 6 months (around 04/10/2024) for CPX.    Leita HILARIO Adie, MD [facility] [place]

## 2023-10-12 NOTE — Assessment & Plan Note (Signed)
 Controlled BP with normal exam. Current regimen is Entresto, Coreg, spironolactone. Will continue same medications; encourage continued reduced sodium diet.

## 2023-10-12 NOTE — Assessment & Plan Note (Signed)
 Doing well on current therapy Sees Cardiology this week.

## 2023-10-15 ENCOUNTER — Other Ambulatory Visit (HOSPITAL_COMMUNITY): Payer: Self-pay

## 2023-10-15 ENCOUNTER — Ambulatory Visit: Payer: Medicare Other | Attending: Cardiology | Admitting: Cardiology

## 2023-10-15 ENCOUNTER — Encounter: Payer: Self-pay | Admitting: Cardiology

## 2023-10-15 VITALS — BP 128/64 | HR 58 | Resp 16 | Wt 197.5 lb

## 2023-10-15 DIAGNOSIS — I5042 Chronic combined systolic (congestive) and diastolic (congestive) heart failure: Secondary | ICD-10-CM | POA: Diagnosis not present

## 2023-10-15 MED ORDER — SACUBITRIL-VALSARTAN 97-103 MG PO TABS: 1.0000 | ORAL_TABLET | Freq: Two times a day (BID) | ORAL | 11 refills | Status: DC

## 2023-10-15 MED ORDER — FUROSEMIDE 20 MG PO TABS: 20.0000 mg | ORAL_TABLET | Freq: Every day | ORAL | 4 refills | Status: DC | PRN

## 2023-10-15 MED ORDER — SPIRONOLACTONE 25 MG PO TABS: 25.0000 mg | ORAL_TABLET | Freq: Every day | ORAL | 3 refills | Status: DC

## 2023-10-15 MED ORDER — POTASSIUM CHLORIDE CRYS ER 10 MEQ PO TBCR: 10.0000 meq | EXTENDED_RELEASE_TABLET | ORAL | 5 refills | Status: DC | PRN

## 2023-10-15 MED ORDER — DAPAGLIFLOZIN PROPANEDIOL 10 MG PO TABS: 10.0000 mg | ORAL_TABLET | Freq: Every day | ORAL | 3 refills | Status: DC

## 2023-10-15 MED ORDER — CARVEDILOL 12.5 MG PO TABS: 12.5000 mg | ORAL_TABLET | Freq: Two times a day (BID) | ORAL | 3 refills | Status: DC

## 2023-10-15 NOTE — Patient Instructions (Signed)
 No changes to your medications.   Go DOWN to LOWER LEVEL (LL) to have your blood work completed inside of [employer] office.  We will only call you if the results are abnormal or if the provider would like to make medication changes.  [facility] will reach out to you about your new medications.  Your physician has requested that you have an echocardiogram. Echocardiography is a painless test that uses sound waves to create images of your heart. It provides your doctor with information about the size and shape of your heart and how well your heart's chambers and valves are working. This procedure takes approximately one hour. There are no restrictions for this procedure. Please do NOT wear cologne, perfume, aftershave, or lotions (deodorant is allowed). Please arrive 15 minutes prior to your appointment time.  Please note: We ask at that you not bring children with you during ultrasound (echo/ vascular) testing. Due to room size and safety concerns, children are not allowed in the ultrasound rooms during exams. Our front office staff cannot provide observation of children in our lobby area while testing is being conducted. An adult accompanying a patient to their appointment will only be allowed in the ultrasound room at the discretion of the ultrasound technician under special circumstances. We apologize for any inconvenience.  Your physician recommends that you schedule a follow-up appointment in: 3 months with an echocardiogram ( April) ** PLEASE CALL THE OFFICE IN MID FEBRUARY TO ARRANGE YOUR FOLLOW UP APPOINTMENT. **  At [facility], you and your health needs are our priority. As part of our continuing mission to provide you with exceptional heart care, we have created designated Provider Care Teams. These Care Teams include your primary Cardiologist (physician) and Advanced Practice Providers (APPs- Physician Assistants and Nurse Practitioners) who all work together to provide  you with the care you need, when you need it.   You may see any of the following providers on your designated Care Team at your next follow up: Dr Toribio Fuel Dr Ezra Shuck Dr. Ria Commander Dr. Morene Brownie Amy Lenetta, NP Caffie Shed, [place] [facility], [place] Beckey Coe, NP Jordan Lee, NP Ellouise Class, NP Jaun Bash, PharmD   Please be sure to bring in all your medications bottles to every appointment.    Thank you for choosing [facility]

## 2023-10-17 NOTE — Progress Notes (Signed)
 PCP: Justus Leita DEL, MD Cardiology: Dr. Darliss HF Cardiology: Dr. Rolan  77 y.o.with history of HTN and osteoarthritis was found to have a cardiomyopathy in 4/24.  Patient reports 2 prior cardiac caths several years ago, one at [facility] and one at [facility].  Neither showed obstructive CAD.  She does have a strong family history of CAD (multiple siblings with MIs/CAD).  She never had echoes with prior workups per her report.  Patient was found to have a LBBB, so echo was ordered by Dr. Darliss.  Echo was done in 4/24 and showed EF 25-30%, moderate LV dilation, normal RV, and moderate MR.  Due to low EF, she was set up for LHC that was done in 5/24.  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  Patient was short of breath and was put on Bipap.  She was markedly hypertensive.  She was admitted overnight and diuresed.   Cardiac MRI in 7/24 showed normal LV size with EF 38%, septal-lateral dyssynchrony, RV EF 56%, no LGE, ECV 28%.    Patient returns for followup of CHF. Weight up 3 lbs.  No exertional dyspnea or chest pain with usual activities.  No orthopnea/PND.  She has chronic back pain.  She also notes leg weakness bilaterally.  She has had this since starting Crestor .  She had the same symptoms with a different statin in the past.    Labs (5/24): K 4.2, creatinine 1.2, LDL 107 Labs (6/24): BNP 75, K 4.8, creatinine 1.31 Labs (7/24): K 4.6, creatinine 1.0 Labs (10/24): BNP 87, creatinine 0.81  PMH: 1. LBBB 2. CAD: LHC ([facility] in 2013) with 50% LAD stenosis.  - LHC (5/24): 30% pLAD, 40% mLAD.  3. Chronic systolic CHF: Nonischemic cardiomyopathy.  - Echo (4/24): EF 25-30%, normal RV, moderate MR.  - Cardiac MRI (7/24): normal LV size with EF 38%, septal-lateral dyssynchrony, RV EF 56%, no LGE, ECV 28%.  4. HTN 5. Meniere's disease 6. Lumbar spinal fusion 1/24 7. OA 8. Asthma 9. Hyperlipidemia: Leg weakness with statins.   Social History    Socioeconomic History   Marital status: Married    Spouse name: Francis Coventry   Number of children: 1   Years of education: Not on file   Highest education level: Not on file  Occupational History   Occupation: Part time  Tobacco Use   Smoking status: Former    Current packs/day: 0.00    Average packs/day: 1.5 packs/day for 30.0 years (45.0 ttl pk-yrs)    Types: Cigarettes    Start date: 11/12/1960    Quit date: 11/12/1990    Years since quitting: 32.9   Smokeless tobacco: Never  Vaping Use   Vaping status: Never Used  Substance and Sexual Activity   Alcohol use: Yes    Alcohol/week: 1.0 standard drink of alcohol    Types: 1 Standard drinks or equivalent per week    Comment: OCC [place], maybe 1 drink a week   Drug use: No   Sexual activity: Not Currently  Other Topics Concern   Not on file  Social History Narrative   Not on file   Social Drivers of Health   Financial Resource Strain: Low Risk  (10/08/2023)   Overall Financial Resource Strain (CARDIA)    Difficulty of Paying Living Expenses: Not hard at all  Food Insecurity: No Food Insecurity (10/08/2023)   Hunger Vital Sign    Worried About Running Out of Food in the  Last Year: Never true    Ran Out of Food in the Last Year: Never true  Transportation Needs: No Transportation Needs (10/08/2023)   PRAPARE - Administrator, Civil Service (Medical): No    Lack of Transportation (Non-Medical): No  Physical Activity: Unknown (10/08/2023)   Exercise Vital Sign    Days of Exercise per Week: Patient declined    Minutes of Exercise per Session: Not on file  Stress: No Stress Concern Present (10/08/2023)   [employer] of Occupational Health - Occupational Stress Questionnaire    Feeling of Stress : Not at all  Social Connections: Socially Integrated (10/08/2023)   Social Connection and Isolation Panel [NHANES]    Frequency of Communication with Friends and Family: More than three times a week    Frequency  of Social Gatherings with Friends and Family: Once a week    Attends Religious Services: 1 to 4 times per year    Active Member of [employer] or Organizations: Yes    Attends Engineer, Structural: More than 4 times per year    Marital Status: Married  Catering Manager Violence: Not At Risk (10/16/2022)   Humiliation, Afraid, Rape, and Kick questionnaire    Fear of Current or Ex-Partner: No    Emotionally Abused: No    Physically Abused: No    Sexually Abused: No   FH: Brother died at 61 of MI, brother died at 60 of MI, brother died in 7s of CVA, mother with CHF.   ROS: All systems reviewed and negative except as per HPI.   Current Outpatient Medications  Medication Sig Dispense Refill   acetaminophen  (TYLENOL ) 500 MG tablet Take 2 tablets (1,000 mg total) by mouth every 6 (six) hours as needed (DO NOT EXCEED 4000 MG DAILY FROM ALL SOURCES). 30 tablet 2   albuterol  (VENTOLIN  HFA) 108 (90 Base) MCG/ACT inhaler Inhale 2 puffs into the lungs every 6 (six) hours as needed for wheezing or shortness of breath. 18 g 1   alum hydroxide-mag trisilicate (GAVISCON ) 80-20 MG CHEW chewable tablet Chew 1 tablet by mouth daily as needed for indigestion or heartburn.     budesonide -formoterol  (SYMBICORT ) 160-4.5 MCG/ACT inhaler Inhale 2 puffs into the lungs in the morning and at bedtime. 1 each 12   cholecalciferol  (VITAMIN D3) 25 MCG (1000 UNIT) tablet Take 1,000 Units by mouth at bedtime.     clonazePAM  (KLONOPIN ) 0.5 MG tablet Take 1 tablet (0.5 mg total) by mouth at bedtime as needed for anxiety. 30 tablet 0   gabapentin  (NEURONTIN ) 300 MG capsule Take 2 capsules (600 mg total) by mouth at bedtime. 180 capsule 1   meclizine  (ANTIVERT ) 12.5 MG tablet Take 12.5 mg by mouth 3 (three) times daily as needed for dizziness. Dr. Salli     Multiple Vitamin (MULTIVITAMIN) tablet Take 1 tablet by mouth every morning.     omeprazole  (PRILOSEC) 20 MG capsule Take 1 capsule (20 mg total) by mouth 2 (two) times  daily before a meal. 180 capsule 1   Probiotic Product (PROBIOTIC DAILY PO) Take 1 capsule by mouth daily.     traMADol  (ULTRAM ) 50 MG tablet Take 50 mg by mouth every 6 (six) hours as needed for moderate pain.     zolpidem  (AMBIEN ) 5 MG tablet Take 1 tablet (5 mg total) by mouth at bedtime. 90 tablet 1   carvedilol  (COREG ) 12.5 MG tablet Take 1 tablet (12.5 mg total) by mouth 2 (two) times daily. 180 tablet 3  cephALEXin  (KEFLEX ) 500 MG capsule TAKE 4 PRIOR TO DENTAL PROCEDURES (Patient not taking: Reported on 10/15/2023) 4 capsule 1   dapagliflozin  propanediol (FARXIGA ) 10 MG TABS tablet Take 1 tablet (10 mg total) by mouth daily. 100 tablet 3   furosemide  (LASIX ) 20 MG tablet Take 1 tablet (20 mg total) by mouth daily as needed (swelling, shortness of breath, gain 3 lbs overnight or 5 lbs in a week). 30 tablet 4   potassium chloride  (KLOR-CON  M) 10 MEQ tablet Take 1 tablet (10 mEq total) by mouth as needed (with lasix ). 30 tablet 5   sacubitril -valsartan  (ENTRESTO ) 97-103 MG Take 1 tablet by mouth 2 (two) times daily. 60 tablet 11   spironolactone  (ALDACTONE ) 25 MG tablet Take 1 tablet (25 mg total) by mouth daily. 90 tablet 3   No current facility-administered medications for this visit.   BP 128/64 (BP Location: Right Arm, Patient Position: Sitting)   Pulse (!) 58   Resp 16   Wt 197 lb 8 oz (89.6 kg)   SpO2 96%   BMI 39.89 kg/m  General: NAD Neck: No JVD, no thyromegaly or thyroid  nodule.  Lungs: Clear to auscultation bilaterally with normal respiratory effort. CV: Nondisplaced PMI.  Heart regular S1/S2, no S3/S4, no murmur.  No peripheral edema.  No carotid bruit.  Normal pedal pulses.  Abdomen: Soft, nontender, no hepatosplenomegaly, no distention.  Skin: Intact without lesions or rashes.  Neurologic: Alert and oriented x 3.  Psych: Normal affect. Extremities: No clubbing or cyanosis.  HEENT: Normal.   Assessment/Plan: 1. Chronic systolic CHF: Nonischemic cardiomyopathy.  No  heavy ETOH/drugs.  No FH of dilated cardiomyopathy (has FH of CAD).  Patient was found to have LBBB, leading to echo in 4/24 showing EF 25-30%, moderate LV dilation, normal RV, and moderate MR.  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  Cardiac MRI in 7/24 showed normal LV size with EF 38%, septal-lateral dyssynchrony, RV EF 56%, no delayed enhancement, ECV 28%. She is not volume overloaded on exam, NYHA class II.   - Keep Lasix  prn.  - Continue Entresto  97/103 bid.  - Continue spironolactone  25 mg daily. BMET/BNP today.  - Continue Farxiga  10 mg daily.  - Continue Coreg  12.5 mg bid, will not increase with HR in 50s.  - She has LBBB 134 msec on last ECG, This does not predict significant benefit from CRT, but she is noted to have septal-lateral dyssynchrony on imaging.  For now, this is a moot point as her EF was > 35% by cardiac MRI. She is due for repeat echo, I will arrange.  2. CAD: Nonobstructive.  Strong FH of MI. Leg weakness with statins, recurrent with Crestor  more recently.  - I will have her stop Crestor  and start on Repatha , arrange with pharmacist.  3. HTN: BP controlled.   4. Asthma: Followed by pulmonary.  5. Obesity: I am going to tray again to see if she can get coverage for GLP-1 agonist.   Followup 3 months with echo.    Ezra Shuck 10/17/2023

## 2023-10-18 ENCOUNTER — Other Ambulatory Visit (HOSPITAL_COMMUNITY): Payer: Self-pay

## 2023-10-20 ENCOUNTER — Telehealth: Payer: Self-pay | Admitting: Pharmacist

## 2023-10-20 ENCOUNTER — Ambulatory Visit (INDEPENDENT_AMBULATORY_CARE_PROVIDER_SITE_OTHER): Payer: Medicare Other

## 2023-10-20 DIAGNOSIS — Z0000 Encounter for general adult medical examination without abnormal findings: Secondary | ICD-10-CM

## 2023-10-20 MED ORDER — EMPAGLIFLOZIN 10 MG PO TABS: 10.0000 mg | ORAL_TABLET | Freq: Every day | ORAL | 11 refills | Status: DC

## 2023-10-20 MED ORDER — REPATHA SURECLICK 140 MG/ML ~~LOC~~ SOAJ: 140.0000 mg | SUBCUTANEOUS | 3 refills | Status: DC

## 2023-10-20 MED ORDER — WEGOVY 0.25 MG/0.5ML ~~LOC~~ SOAJ: 0.2500 mg | SUBCUTANEOUS | 0 refills | Status: DC

## 2023-10-20 MED ORDER — CARVEDILOL 12.5 MG PO TABS: 12.5000 mg | ORAL_TABLET | Freq: Two times a day (BID) | ORAL | 3 refills | Status: DC

## 2023-10-20 NOTE — Telephone Encounter (Signed)
 [employer] covers Jardiance  but does not cover Farxiga  without prior authorization. New medication sent. Prior authroization sent for Repatha  and Wegovy .

## 2023-10-20 NOTE — Patient Instructions (Addendum)
 Ms. Schuneman , Thank you for taking time to come for your Medicare Wellness Visit. I appreciate your ongoing commitment to your health goals. Please review the following plan we discussed and let me know if I can assist you in the future.   Referrals/Orders/Follow-Ups/Clinician Recommendations: NONE  This is a list of the screening recommended for you and due dates:  Health Maintenance  Topic Date Due   COVID-19 Vaccine (4 - 2024-25 season) 10/28/2023*   Flu Shot  01/03/2024*   Zoster (Shingles) Vaccine (1 of 2) 01/10/2024*   Medicare Annual Wellness Visit  10/19/2024   Colon Cancer Screening  06/04/2026   DTaP/Tdap/Td vaccine (2 - Td or Tdap) 06/09/2029   Pneumonia Vaccine  Completed   DEXA scan (bone density measurement)  Completed   Hepatitis C Screening  Completed   HPV Vaccine  Aged Out  *Topic was postponed. The date shown is not the original due date.    Advanced directives: (ACP Link)Information on Advanced Care Planning can be found at [facility]  Secretary of [facility] Advance Health Care Directives Advance Health Care Directives ([email or web address]   Next Medicare Annual Wellness Visit scheduled for next year: Yes   10/25/24 @ 8:10 AM BY VIDEO

## 2023-10-20 NOTE — Telephone Encounter (Signed)
 Error

## 2023-10-20 NOTE — Progress Notes (Signed)
 Subjective:   Katrina Poole is a 78 y.o. female who presents for Medicare Annual (Subsequent) preventive examination.  Visit Complete: Virtual I connected with  Katrina Poole on 10/20/23 by a video and audio enabled telemedicine application and verified that I am speaking with the correct person using two identifiers.  Patient Location: Home  Provider Location: Office/Clinic  I discussed the limitations of evaluation and management by telemedicine. The patient expressed understanding and agreed to proceed.  Vital Signs: Because this visit was a virtual/telehealth visit, some criteria may be missing or patient reported. Any vitals not documented were not able to be obtained and vitals that have been documented are patient reported.  Cardiac Risk Factors include: advanced age (>37men, >42 women);dyslipidemia;hypertension;obesity (BMI >30kg/m2);sedentary lifestyle     Objective:    There were no vitals filed for this visit. There is no height or weight on file to calculate BMI.     10/20/2023    8:08 AM 10/09/2022    6:06 AM 09/29/2022   10:22 AM 02/16/2022    9:57 PM 02/16/2022    5:16 PM 02/09/2022    8:37 AM 02/04/2022    9:44 AM  Advanced Directives  Does Patient Have a Medical Advance Directive? No Yes No Yes Yes Yes Yes  Type of Furniture conservator/restorer;Living will   Healthcare Power of [place];Living will Healthcare Power of [place];Living will Healthcare Power of [place];Living will  Does patient want to make changes to medical advance directive?  No - Patient declined  No - Guardian declined   No - Patient declined  Copy of Healthcare Power of Attorney in Chart?  No - copy requested  No - copy requested, Physician notified  No - copy requested No - copy requested  Would patient like information on creating a medical advance directive? No - Patient declined  No - Patient declined        Current Medications (verified) Outpatient Encounter  Medications as of 10/20/2023  Medication Sig   acetaminophen  (TYLENOL ) 500 MG tablet Take 2 tablets (1,000 mg total) by mouth every 6 (six) hours as needed (DO NOT EXCEED 4000 MG DAILY FROM ALL SOURCES).   alum hydroxide-mag trisilicate (GAVISCON ) 80-20 MG CHEW chewable tablet Chew 1 tablet by mouth daily as needed for indigestion or heartburn.   budesonide -formoterol  (SYMBICORT ) 160-4.5 MCG/ACT inhaler Inhale 2 puffs into the lungs in the morning and at bedtime.   carvedilol  (COREG ) 12.5 MG tablet Take 1 tablet (12.5 mg total) by mouth 2 (two) times daily.   cephALEXin  (KEFLEX ) 500 MG capsule TAKE 4 PRIOR TO DENTAL PROCEDURES   cholecalciferol  (VITAMIN D3) 25 MCG (1000 UNIT) tablet Take 1,000 Units by mouth at bedtime.   clonazePAM  (KLONOPIN ) 0.5 MG tablet Take 1 tablet (0.5 mg total) by mouth at bedtime as needed for anxiety.   dapagliflozin  propanediol (FARXIGA ) 10 MG TABS tablet Take 1 tablet (10 mg total) by mouth daily.   furosemide  (LASIX ) 20 MG tablet Take 1 tablet (20 mg total) by mouth daily as needed (swelling, shortness of breath, gain 3 lbs overnight or 5 lbs in a week).   gabapentin  (NEURONTIN ) 300 MG capsule Take 2 capsules (600 mg total) by mouth at bedtime.   meclizine  (ANTIVERT ) 12.5 MG tablet Take 12.5 mg by mouth 3 (three) times daily as needed for dizziness. Dr. Loretta Romp   Multiple Vitamin (MULTIVITAMIN) tablet Take 1 tablet by mouth every morning.   omeprazole  (PRILOSEC) 20 MG capsule Take 1 capsule (  20 mg total) by mouth 2 (two) times daily before a meal.   potassium chloride  (KLOR-CON  M) 10 MEQ tablet Take 1 tablet (10 mEq total) by mouth as needed (with lasix ).   Probiotic Product (PROBIOTIC DAILY PO) Take 1 capsule by mouth daily.   sacubitril -valsartan  (ENTRESTO ) 97-103 MG Take 1 tablet by mouth 2 (two) times daily.   spironolactone  (ALDACTONE ) 25 MG tablet Take 1 tablet (25 mg total) by mouth daily.   traMADol  (ULTRAM ) 50 MG tablet Take 50 mg by mouth every 6 (six) hours as  needed for moderate pain.   zolpidem  (AMBIEN ) 5 MG tablet Take 1 tablet (5 mg total) by mouth at bedtime.   albuterol  (VENTOLIN  HFA) 108 (90 Base) MCG/ACT inhaler Inhale 2 puffs into the lungs every 6 (six) hours as needed for wheezing or shortness of breath. (Patient not taking: Reported on 10/20/2023)   No facility-administered encounter medications on file as of 10/20/2023.    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   Hypertension    controlled on meds   Lumbago of lumbar region with sciatica 06/12/2021   Meniere syndrome    hearing deaf right ear   Neuromuscular disorder (HCC)    finger tips go numb, neck vertebrae   Restless leg syndrome    Spondylolisthesis, lumbosacral region    Status post arthroscopic surgery of left knee 08/15/2015   Vertigo    Wears dentures    full dentures on top, partial bottom   Past Surgical History:  Procedure Laterality Date   ABDOMINAL EXPOSURE N/A 10/09/2022   Procedure: ABDOMINAL EXPOSURE;  Surgeon: Young Hensen, MD;  Location: [facility] OR;  Service: Vascular;  Laterality: N/A;   ANTERIOR CERVICAL DECOMP/DISCECTOMY FUSION N/A 03/16/2018   Procedure: Anterior Cervical Discectomy Fusion - Cervical Three- Cervical Four, Cervical Four- Cervical Five, Cervical Five-Cervical Six, Cervical Six- Cerival Seven;  Surgeon: Gearl Keens, MD;  Location: [facility] OR;  Service: Neurosurgery;  Laterality: N/A;  Anterior Cervical Discectomy Fusion - Cervical Three- Cervical Four, Cervical Four- Cervical Five, Cervical Five-Cervical Six, Cervical Six- Ceriva    ANTERIOR LUMBAR FUSION N/A 10/09/2022   Procedure: Anteriorr Lumbar Interbody Fusion - Level Five-Sacral One  with posterior cortical screw augmentation and decompressive laminotomy on the right;  Surgeon: Gearl Keens, MD;  Location: [facility] OR;  Service: Neurosurgery;  Laterality: N/A;   BUNIONECTOMY     Left x1, Right x2.   CARDIAC CATHETERIZATION     x2 both normal   CARPAL TUNNEL RELEASE Right    COLONOSCOPY     COLONOSCOPY WITH PROPOFOL  N/A 06/04/2016   Procedure: COLONOSCOPY WITH PROPOFOL ;  Surgeon: Deveron Fly, MD;  Location: [facility] ENDOSCOPY;  Service: Endoscopy;  Laterality: N/A;   DILATION AND CURETTAGE OF UTERUS     ECTOPIC PREGNANCY SURGERY     ESOPHAGOGASTRODUODENOSCOPY (EGD) WITH PROPOFOL  N/A 06/04/2016   Procedure: ESOPHAGOGASTRODUODENOSCOPY (EGD) WITH PROPOFOL ;  Surgeon: Deveron Fly, MD;  Location: [facility] ENDOSCOPY;  Service: Endoscopy;  Laterality: N/A;   exploratory laparoscopy     infertility surgery   HALLUX VALGUS CORRECTION Bilateral  JOINT REPLACEMENT Bilateral    partial knee   KNEE ARTHROPLASTY Left 01/27/2021   Procedure: CONVERSION OF LEFT PARTIAL KNEE TO LEFT TOTAL KNEE.;  Surgeon: Arlyne Lame, MD;  Location: [facility] ORS;  Service: Orthopedics;  Laterality: Left;   KNEE ARTHROSCOPY Left 07/19/2015   Procedure: Arthroscopic limited synovectomy along with lateral compartment and retropatellar chondral debridement plus removal of several small loose bodies;  Surgeon: Josephus Nida, MD;  Location: [facility];  Service: Orthopedics;  Laterality: Left;  1ST CASE PER CECE   LAMINECTOMY WITH POSTERIOR LATERAL ARTHRODESIS LEVEL 2 N/A 10/09/2022   Procedure: LAMINECTOMY WITH POSTERIOR LATERAL ARTHRODESIS LEVEL TWO;  Surgeon: Gearl Keens, MD;  Location: [facility] OR;  Service: Neurosurgery;  Laterality: N/A;   LEFT HEART CATH AND CORONARY ANGIOGRAPHY Left 02/08/2023   Procedure: LEFT HEART CATH AND CORONARY ANGIOGRAPHY;  Surgeon: Wenona Hamilton, MD;  Location: [facility]  INVASIVE CV LAB;  Service: Cardiovascular;  Laterality: Left;   LUMBAR LAMINECTOMY/DECOMPRESSION MICRODISCECTOMY Right 04/29/2020   Procedure: Microdiscectomy - Lumbar Three-Lumbar Four - Lumbar Four-Lumbar Five - right;  Surgeon: Gearl Keens, MD;  Location: [facility] OR;  Service: Neurosurgery;  Laterality: Right;  Microdiscectomy - Lumbar Three-Lumbar Four - Lumbar Four-Lumbar Five - right   MEDIAL PARTIAL KNEE REPLACEMENT Bilateral    POSTERIOR LUMBAR FUSION  02/2022   L3-4 and L4-5   SHOULDER SURGERY Right    TOTAL HIP ARTHROPLASTY Left 11/25/2015   Procedure: TOTAL HIP ARTHROPLASTY;  Surgeon: Arlyne Lame, MD;  Location: [facility] ORS;  Service: Orthopedics;  Laterality: Left;   TRIGGER FINGER RELEASE Left    Family History  Problem Relation Age of Onset   Heart failure Mother    COPD Mother    Hypertension Mother    Cancer Father    Heart attack Brother    Heart attack Brother 59   Heart attack Brother    Heart disease Brother    Social History   Socioeconomic History   Marital status: Married    Spouse name: Pamella Boer   Number of children: 1   Years of education: Not on file   Highest education level: Not on file  Occupational History   Occupation: Part time  Tobacco Use   Smoking status: Former    Current packs/day: 0.00    Average packs/day: 1.5 packs/day for 30.0 years (45.0 ttl pk-yrs)    Types: Cigarettes    Start date: 11/12/1960    Quit date: 11/12/1990    Years since quitting: 32.9   Smokeless tobacco: Never  Vaping Use   Vaping status: Never Used  Substance and Sexual Activity   Alcohol use: Yes    Alcohol/week: 1.0 standard drink of alcohol    Types: 1 Standard drinks or equivalent per week    Comment: OCC [place], maybe 1 drink a week   Drug use: No   Sexual activity: Not Currently  Other Topics Concern   Not on file  Social History Narrative   Not on file   Social Drivers of Health   Financial Resource Strain: Low Risk  (10/20/2023)   Overall  Financial Resource Strain (CARDIA)    Difficulty of Paying Living Expenses: Not hard at all  Food Insecurity: No Food Insecurity (10/20/2023)   Hunger Vital Sign    Worried About Running Out of Food in the Last Year: Never true    Ran Out of Food in the Last Year: Never true  Transportation Needs: No Transportation Needs (10/20/2023)  PRAPARE - Administrator, Civil Service (Medical): No    Lack of Transportation (Non-Medical): No  Physical Activity: Insufficiently Active (10/20/2023)   Exercise Vital Sign    Days of Exercise per Week: 5 days    Minutes of Exercise per Session: 20 min  Stress: No Stress Concern Present (10/20/2023)   [employer] of Occupational Health - Occupational Stress Questionnaire    Feeling of Stress : Not at all  Social Connections: Moderately Integrated (10/20/2023)   Social Connection and Isolation Panel [NHANES]    Frequency of Communication with Friends and Family: More than three times a week    Frequency of Social Gatherings with Friends and Family: Once a week    Attends Religious Services: Never    Database administrator or Organizations: Yes    Attends Engineer, structural: More than 4 times per year    Marital Status: Married    Tobacco Counseling Counseling given: Not Answered   Clinical Intake:  Pre-visit preparation completed: Yes  Pain : No/denies pain     BMI - recorded: 39.8 Nutritional Status: BMI > 30  Obese Nutritional Risks: None Diabetes: No  How often do you need to have someone help you when you read instructions, pamphlets, or other written materials from your doctor or pharmacy?: 1 - Never  Interpreter Needed?: No  Information entered by :: Dellie Fergusson, LPN   Activities of Daily Living    10/20/2023    8:09 AM 10/16/2023    9:45 AM  In your present state of health, do you have any difficulty performing the following activities:  Hearing? 0 0  Vision? 0 0  Difficulty concentrating or  making decisions? 0 0  Walking or climbing stairs? 1 1  Dressing or bathing? 0 0  Doing errands, shopping? 0 0  Preparing Food and eating ? N N  Using the Toilet? N N  In the past six months, have you accidently leaked urine? N N  Do you have problems with loss of bowel control? N N  Managing your Medications? N N  Managing your Finances? N N  Housekeeping or managing your Housekeeping? N N    Patient Care Team: Sheron Dixons, MD as PCP - General (Internal Medicine) Constancia Delton, MD as PCP - Cardiology (Cardiology) Gearl Keens, MD as Consulting Physician (Neurosurgery) Sherril Dole (Inactive) as Referring Physician (Gastroenterology) Rosan Comfort, MD as Consulting Physician (Neurology) Aubry Blase, Robbie Chiles, MD (Orthopedic Surgery) Julia Oats, OD (Optometry)  Indicate any recent Medical Services you may have received from other than [facility] providers in the past year (date may be approximate).     Assessment:   This is a routine wellness examination for [place].  Hearing/Vision screen Hearing Screening - Comments:: NO AIDS Vision Screening - Comments:: NO GLASSES- [facility]   Goals Addressed             This Visit's Progress    DIET - EAT MORE FRUITS AND VEGETABLES         Depression Screen    10/20/2023    8:06 AM 10/12/2023    9:17 AM 04/13/2023    8:12 AM 11/18/2022    8:58 AM 10/16/2022    8:24 AM 04/22/2022    8:30 AM 10/23/2021    8:53 AM  PHQ 2/9 Scores  PHQ - 2 Score 0 0 1 0 0 1 0  PHQ- 9 Score 0 0 6 0  2 1  Fall Risk    10/20/2023    8:08 AM 10/16/2023    9:45 AM 10/12/2023    9:17 AM 04/13/2023    8:12 AM 11/18/2022    8:58 AM  Fall Risk   Falls in the past year? 1 0 1 0 0  Number falls in past yr: 0  0 0 0  Injury with Fall? 0  0 0 0  Risk for fall due to : History of fall(s)  No Fall Risks No Fall Risks Impaired balance/gait  Follow up Falls evaluation completed;Falls prevention discussed  Falls evaluation completed Falls evaluation completed  Falls evaluation completed    MEDICARE RISK AT HOME: Medicare Risk at Home Any stairs in or around the home?: Yes If so, are there any without handrails?: No Home free of loose throw rugs in walkways, pet beds, electrical cords, etc?: Yes Adequate lighting in your home to reduce risk of falls?: Yes Life alert?: No Use of a cane, walker or w/c?: No Grab bars in the bathroom?: Yes Shower chair or bench in shower?: Yes Elevated toilet seat or a handicapped toilet?: Yes  TIMED UP AND GO:  Was the test performed?  No    Cognitive Function:        10/20/2023    8:10 AM 10/16/2022    8:25 AM  6CIT Screen  What Year? 0 points 0 points  What month? 0 points 0 points  What time? 0 points 0 points  Count back from 20 0 points 0 points  Months in reverse 0 points 0 points  Repeat phrase 4 points 0 points  Total Score 4 points 0 points    Immunizations Immunization History  Administered Date(s) Administered   Moderna Sars-Covid-2 Vaccination 11/19/2019, 12/18/2019, 08/21/2020   PNEUMOCOCCAL CONJUGATE-20 04/13/2023   Pneumococcal Conjugate-13 12/26/2013   Pneumococcal Polysaccharide-23 01/22/2015   Tdap 06/10/2019    TDAP status: Up to date  Flu Vaccine status: Declined, Education has been provided regarding the importance of this vaccine but patient still declined. Advised may receive this vaccine at local pharmacy or Health Dept. Aware to provide a copy of the vaccination record if obtained from local pharmacy or Health Dept. Verbalized acceptance and understanding.  Pneumococcal vaccine status: Up to date  Covid-19 vaccine status: Completed vaccines  Qualifies for Shingles Vaccine? Yes   Zostavax completed No   Shingrix Completed?: No.    Education has been provided regarding the importance of this vaccine. Patient has been advised to call insurance company to determine out of pocket expense if they have not yet received this vaccine. Advised may also receive vaccine at  local pharmacy or Health Dept. Verbalized acceptance and understanding.  Screening Tests Health Maintenance  Topic Date Due   COVID-19 Vaccine (4 - 2024-25 season) 10/28/2023 (Originally 06/06/2023)   INFLUENZA VACCINE  01/03/2024 (Originally 05/06/2023)   Zoster Vaccines- Shingrix (1 of 2) 01/10/2024 (Originally 02/12/1965)   Medicare Annual Wellness (AWV)  10/19/2024   Colonoscopy  06/04/2026   DTaP/Tdap/Td (2 - Td or Tdap) 06/09/2029   Pneumonia Vaccine 57+ Years old  Completed   DEXA SCAN  Completed   Hepatitis C Screening  Completed   HPV VACCINES  Aged Out    Health Maintenance  There are no preventive care reminders to display for this patient.   Colorectal cancer screening: No longer required.   Mammogram status: No longer required due to AGE.- HAS ONE SCHEDULED FOR 11/30/23  Bone Density status: Completed 05/03/19. Results reflect: Bone density results:  NORMAL. Repeat every 5 years.  Lung Cancer Screening: (Low Dose CT Chest recommended if Age 42-80 years, 20 pack-year currently smoking OR have quit w/in 15years.) does not qualify.    Additional Screening:  Hepatitis C Screening: does qualify; Completed 10/23/21  Vision Screening: Recommended annual ophthalmology exams for early detection of glaucoma and other disorders of the eye. Is the patient up to date with their annual eye exam?  Yes  Who is the provider or what is the name of the office in which the patient attends annual eye exams? [facility] If pt is not established with a provider, would they like to be referred to a provider to establish care? No .   Dental Screening: Recommended annual dental exams for proper oral hygiene    Community Resource Referral / Chronic Care Management: CRR required this visit?  No   CCM required this visit?  No     Plan:     I have personally reviewed and noted the following in the patient's chart:   Medical and social history Use of alcohol, tobacco or illicit drugs   Current medications and supplements including opioid prescriptions. Patient is not currently taking opioid prescriptions. Functional ability and status Nutritional status Physical activity Advanced directives List of other physicians Hospitalizations, surgeries, and ER visits in previous 12 months Vitals Screenings to include cognitive, depression, and falls Referrals and appointments  In addition, I have reviewed and discussed with patient certain preventive protocols, quality metrics, and best practice recommendations. A written personalized care plan for preventive services as well as general preventive health recommendations were provided to patient.     Pinky Bright, LPN   1/61/0960   After Visit Summary: (MyChart) Due to this being a telephonic visit, the after visit summary with patients personalized plan was offered to patient via MyChart   Nurse Notes: NONE

## 2023-10-21 DIAGNOSIS — I5042 Chronic combined systolic (congestive) and diastolic (congestive) heart failure: Secondary | ICD-10-CM | POA: Diagnosis not present

## 2023-10-23 LAB — BASIC METABOLIC PANEL
BUN/Creatinine Ratio: 25 (ref 12–28)
BUN: 26 mg/dL (ref 8–27)
CO2: 19 mmol/L — ABNORMAL LOW (ref 20–29)
Calcium: 8.8 mg/dL (ref 8.7–10.3)
Chloride: 104 mmol/L (ref 96–106)
Creatinine, Ser: 1.06 mg/dL — ABNORMAL HIGH (ref 0.57–1.00)
Glucose: 91 mg/dL (ref 70–99)
Potassium: 4.9 mmol/L (ref 3.5–5.2)
Sodium: 138 mmol/L (ref 134–144)
eGFR: 55 mL/min/{1.73_m2} — ABNORMAL LOW (ref >59)

## 2023-10-23 LAB — LIPID PANEL
Chol/HDL Ratio: 2.3 {ratio} (ref 0.0–4.4)
Cholesterol, Total: 120 mg/dL (ref 100–199)
HDL: 53 mg/dL (ref >39)
LDL Chol Calc (NIH): 50 mg/dL (ref 0–99)
Triglycerides: 87 mg/dL (ref 0–149)
VLDL Cholesterol Cal: 17 mg/dL (ref 5–40)

## 2023-10-23 LAB — BRAIN NATRIURETIC PEPTIDE: BNP: 22 pg/mL (ref 0.0–100.0)

## 2023-10-23 LAB — CK: Total CK: 52 U/L (ref 32–182)

## 2023-10-26 ENCOUNTER — Other Ambulatory Visit (HOSPITAL_COMMUNITY): Payer: Self-pay

## 2023-10-26 ENCOUNTER — Telehealth: Payer: Self-pay | Admitting: Pharmacist

## 2023-10-26 DIAGNOSIS — T8484XA Pain due to internal orthopedic prosthetic devices, implants and grafts, initial encounter: Secondary | ICD-10-CM | POA: Diagnosis not present

## 2023-10-26 DIAGNOSIS — M79672 Pain in left foot: Secondary | ICD-10-CM | POA: Diagnosis not present

## 2023-10-26 MED ORDER — CARVEDILOL 12.5 MG PO TABS: 12.5000 mg | ORAL_TABLET | Freq: Two times a day (BID) | ORAL | 3 refills | Status: DC

## 2023-10-26 NOTE — Telephone Encounter (Signed)
[facility] reports pharmacy canceled carvedilol prescription. New prescription sent. Patient still awaiting Tricare to be changed to primary insurance.

## 2023-10-27 ENCOUNTER — Telehealth: Payer: Self-pay | Admitting: *Deleted

## 2023-10-27 NOTE — Telephone Encounter (Signed)
   Patient Name: Katrina Poole  DOB: 06-03-1946 MRN: [id number]  Primary Cardiologist: Debbe Odea, MD  Chart reviewed as part of pre-operative protocol coverage. Given past medical history and time since last visit, based on ACC/AHA guidelines, Katrina Poole is at acceptable risk for the planned procedure without further cardiovascular testing.   The patient was advised that if she develops new symptoms prior to surgery to contact our office to arrange for a follow-up visit, and she verbalized understanding.  I will route this recommendation to the requesting party via Epic fax function and remove from pre-op pool.  Please call with questions.  Napoleon Form, Leodis Rains, NP 10/27/2023, 5:12 PM

## 2023-10-27 NOTE — Telephone Encounter (Signed)
   [facility] Pre-operative Risk Assessment    Patient Name: Katrina Poole  DOB: 12/20/1945 MRN: [id number]  HEARTCARE STAFF:  - IMPORTANT!!!!!! Under Visit Info/Reason for Call, type in Other and utilize the format Clearance MM/DD/YY or Clearance TBD. Do not use dashes or single digits. - Please review there is not already an duplicate clearance open for this procedure. - If request is for dental extraction, please clarify the # of teeth to be extracted. - If the patient is currently at the dentist's office, call Pre-Op Callback Staff (MA/nurse) to input urgent request.  - If the patient is not currently in the dentist office, please route to the Pre-Op pool.  Request for surgical clearance:  What type of surgery is being performed? Left foot hardware removal  When is this surgery scheduled? TBD  What type of clearance is required (medical clearance vs. Pharmacy clearance to hold med vs. Both)? Medical  Are there any medications that need to be held prior to surgery and how long? None listed  Practice name and name of physician performing surgery? [facility]  What is the office phone number? [phone]   7.   What is the office fax number? [phone]  8.   Anesthesia type (None, local, MAC, general) ? Not indicated   Valrie Hart 10/27/2023, 3:44 PM  _________________________________________________________________   (provider comments below)

## 2023-10-27 NOTE — Telephone Encounter (Signed)
Dr. Shirlee Latch  We have received a surgical clearance request for Ms. Heitzenrater who is scheduled for left foot hardware removal.. They were seen recently in clinic on 10/15/2023 for follow-up.  She has a PMH of nonobstructive CAD,HFrEF secondary to NICM, LBBB, HTN, HLD. Can you please comment on surgical clearance for her upcoming foot hardware removal. Please forward you guidance and recommendations to P CV DIV PREOP  Thank you, Robin Searing, NP

## 2023-10-28 ENCOUNTER — Encounter: Payer: Self-pay | Admitting: Podiatry

## 2023-10-28 ENCOUNTER — Other Ambulatory Visit: Payer: Self-pay | Admitting: Neurosurgery

## 2023-10-28 ENCOUNTER — Other Ambulatory Visit: Payer: Self-pay | Admitting: Podiatry

## 2023-10-28 DIAGNOSIS — M47816 Spondylosis without myelopathy or radiculopathy, lumbar region: Secondary | ICD-10-CM | POA: Diagnosis not present

## 2023-10-28 DIAGNOSIS — M4317 Spondylolisthesis, lumbosacral region: Secondary | ICD-10-CM | POA: Diagnosis not present

## 2023-10-28 NOTE — Discharge Instructions (Addendum)

## 2023-11-01 ENCOUNTER — Telehealth: Payer: Self-pay | Admitting: Cardiology

## 2023-11-01 NOTE — Telephone Encounter (Signed)
11/01/2023 08:31 AM EST by Octaviano Batty  Incoming Klauer, Lisabeth Mian (Self) [phone] (Mobile)   pt lvm saying she has surgery coming up and was told to not take farxiga and pt was wondering should she not take medication.    Returned pt's call and advised pt to follow Dr's orders of stopping medication Marcelline Deist) before upcoming surgery. Pt maintains she is apprehensive of following provider's advisory but agrees to do so.

## 2023-11-02 ENCOUNTER — Encounter: Payer: Self-pay | Admitting: Podiatry

## 2023-11-02 NOTE — Anesthesia Preprocedure Evaluation (Addendum)
Anesthesia Evaluation  Patient identified by MRN, date of birth, ID band Patient awake    Reviewed: Allergy & Precautions, NPO status , Patient's Chart, lab work & pertinent test results  History of Anesthesia Complications Negative for: history of anesthetic complications  Airway Mallampati: III  TM Distance: >3 FB Neck ROM: Full    Dental  (+) Edentulous Upper, Edentulous Lower, Dental Advidsory Given   Pulmonary neg pulmonary ROS, asthma , COPD,  COPD inhaler, former smoker   Pulmonary exam normal breath sounds clear to auscultation       Cardiovascular hypertension, Pt. on medications (-) angina negative cardio ROS Normal cardiovascular exam+ dysrhythmias  Rhythm:Regular Rate:Normal     Neuro/Psych  PSYCHIATRIC DISORDERS Anxiety     vertigo negative neurological ROS  negative psych ROS   GI/Hepatic negative GI ROS, Neg liver ROS,GERD  Medicated and Controlled,,  Endo/Other  negative endocrine ROS  Class 3 obesityBMI 42.5  Renal/GU negative Renal ROS  negative genitourinary   Musculoskeletal   Abdominal   Peds  Hematology negative hematology ROS (+)   Anesthesia Other Findings Patient is adament that she didn't want fentanyl due to a previous experience. States that she "wasn't in control". Discussed options if the local in her foot was not enough. Patient stated she would rather be intubated than be given fentanyl. Patient does not want any opioids in the post op period. Clarified that she was ok with tylenol or ibuprofen. Patient stated she was.   Past Medical History: 02/08/2023: Acute urinary retention No date: Aortic atherosclerosis (HCC) No date: Arthritis     Comment:  osteo of left hip No date: Asthma     Comment:  humidity and exercise during summer No date: Combined systolic and diastolic heart failure (HCC) No date: Complication of anesthesia     Comment:  Pt reports fentanyl caused her current  heart problems 02/08/2023: Flash pulmonary edema (HCC) No date: GERD (gastroesophageal reflux disease) 01/27/2021: History of revision of total knee arthroplasty No date: Hypertension     Comment:  controlled on meds 06/12/2021: Lumbago of lumbar region with sciatica 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 No date: Neuromuscular disorder (HCC)     Comment:  finger tips go numb, neck vertebrae No date: Restless leg syndrome No date: Spondylolisthesis, lumbosacral region 08/15/2015: Status post arthroscopic surgery of left knee No date: Vertigo No date: Wears dentures     Comment:  full dentures on top, partial bottom  Past Surgical History: 10/09/2022: ABDOMINAL EXPOSURE; N/A     Comment:  Procedure: ABDOMINAL EXPOSURE;  Surgeon: Cephus Shelling, MD;  Location: [facility] OR;  Service: Vascular;               Laterality: N/A; 03/16/2018: ANTERIOR CERVICAL DECOMP/DISCECTOMY FUSION; N/A     Comment:  Procedure: Anterior Cervical Discectomy Fusion -               Cervical Three- Cervical Four, Cervical Four- Cervical               Five, Cervical Five-Cervical Six, Cervical Six- Cerival               Seven;  Surgeon: Donalee Citrin, MD;  Location: [facility] OR;                Service: Neurosurgery;  Laterality: N/A;  Anterior  Cervical Discectomy Fusion - Cervical Three- Cervical               Four, Cervical Four- Cervical Five, Cervical               Five-Cervical Six, Cervical Six- Ceriva 10/09/2022: ANTERIOR LUMBAR FUSION; N/A     Comment:  Procedure: Anteriorr Lumbar Interbody Fusion - Level               Five-Sacral One  with posterior cortical screw               augmentation and decompressive laminotomy on the right;                Surgeon: Donalee Citrin, MD;  Location: [facility] OR;  Service:               Neurosurgery;  Laterality: N/A; No date: BUNIONECTOMY     Comment:  Left x1, Right x2. No date: CARDIAC CATHETERIZATION     Comment:  x2 both  normal No date: CARPAL TUNNEL RELEASE; Right No date: COLONOSCOPY 06/04/2016: COLONOSCOPY WITH PROPOFOL; N/A     Comment:  Procedure: COLONOSCOPY WITH PROPOFOL;  Surgeon: Christena Deem, MD;  Location: [facility] ENDOSCOPY;  Service:               Endoscopy;  Laterality: N/A; No date: DILATION AND CURETTAGE OF UTERUS No date: ECTOPIC PREGNANCY SURGERY 06/04/2016: ESOPHAGOGASTRODUODENOSCOPY (EGD) WITH PROPOFOL; N/A     Comment:  Procedure: ESOPHAGOGASTRODUODENOSCOPY (EGD) WITH               PROPOFOL;  Surgeon: Christena Deem, MD;  Location:               [facility] ENDOSCOPY;  Service: Endoscopy;  Laterality: N/A; No date: exploratory laparoscopy     Comment:  infertility surgery No date: HALLUX VALGUS CORRECTION; Bilateral No date: JOINT REPLACEMENT; Bilateral     Comment:  partial knee 01/27/2021: KNEE ARTHROPLASTY; Left     Comment:  Procedure: CONVERSION OF LEFT PARTIAL KNEE TO LEFT TOTAL              KNEE.;  Surgeon: Donato Heinz, MD;  Location: [facility]               ORS;  Service: Orthopedics;  Laterality: Left; 07/19/2015: KNEE ARTHROSCOPY; Left     Comment:  Procedure: Arthroscopic limited synovectomy along with               lateral compartment and retropatellar chondral               debridement plus removal of several small loose bodies;                Surgeon: Erin Sons, MD;  Location: [facility] SURGERY               CNTR;  Service: Orthopedics;  Laterality: Left;  1ST CASE              PER CECE 10/09/2022: LAMINECTOMY WITH POSTERIOR LATERAL ARTHRODESIS LEVEL 2; N/A     Comment:  Procedure: LAMINECTOMY WITH POSTERIOR LATERAL               ARTHRODESIS LEVEL TWO;  Surgeon: Donalee Citrin, MD;                Location: [facility] OR;  Service: Neurosurgery;  Laterality:  N/A; 02/08/2023: LEFT HEART CATH AND CORONARY ANGIOGRAPHY; Left     Comment:  Procedure: LEFT HEART CATH AND CORONARY ANGIOGRAPHY;                Surgeon: Iran Ouch, MD;  Location: [facility]  INVASIVE               CV LAB;  Service: Cardiovascular;  Laterality: Left; 04/29/2020: LUMBAR LAMINECTOMY/DECOMPRESSION MICRODISCECTOMY; Right     Comment:  Procedure: Microdiscectomy - Lumbar Three-Lumbar Four -               Lumbar Four-Lumbar Five - right;  Surgeon: Donalee Citrin,               MD;  Location: [facility] OR;  Service: Neurosurgery;                Laterality: Right;  Microdiscectomy - Lumbar Three-Lumbar              Four - Lumbar Four-Lumbar Five - right No date: MEDIAL PARTIAL KNEE REPLACEMENT; Bilateral 02/2022: POSTERIOR LUMBAR FUSION     Comment:  L3-4 and L4-5 No date: SHOULDER SURGERY; Right 11/25/2015: TOTAL HIP ARTHROPLASTY; Left     Comment:  Procedure: TOTAL HIP ARTHROPLASTY;  Surgeon: Donato Heinz, MD;  Location: [facility] ORS;  Service: Orthopedics;                Laterality: Left; No date: TRIGGER FINGER RELEASE; Left  BMI    Body Mass Index: 41.47 kg/m      Reproductive/Obstetrics negative OB ROS                             Anesthesia Physical Anesthesia Plan  ASA: 3  Anesthesia Plan: General   Post-op Pain Management: Minimal or no pain anticipated   Induction: Intravenous  PONV Risk Score and Plan: 3 and Propofol infusion, TIVA and Ondansetron  Airway Management Planned: Nasal Cannula  Additional Equipment: None  Intra-op Plan:   Post-operative Plan:   Informed Consent: I have reviewed the patients History and Physical, chart, labs and discussed the procedure including the risks, benefits and alternatives for the proposed anesthesia with the patient or authorized representative who has indicated his/her understanding and acceptance.     Dental advisory given  Plan Discussed with: CRNA and Surgeon  Anesthesia Plan Comments: (Discussed risks of anesthesia with patient, including possibility of difficulty with spontaneous ventilation under anesthesia necessitating airway intervention, PONV, and rare risks  such as cardiac or respiratory or neurological events, and allergic reactions. Discussed the role of CRNA in patient's perioperative care. Patient understands.)       Anesthesia Quick Evaluation

## 2023-11-03 ENCOUNTER — Ambulatory Visit
Admission: RE | Admit: 2023-11-03 | Discharge: 2023-11-03 | Disposition: A | Discharge status: Home / Self Care | Payer: Medicare Other | Source: Ambulatory Visit | Attending: Neurosurgery | Admitting: Neurosurgery

## 2023-11-03 DIAGNOSIS — M5135 Other intervertebral disc degeneration, thoracolumbar region: Secondary | ICD-10-CM | POA: Diagnosis not present

## 2023-11-03 DIAGNOSIS — M4317 Spondylolisthesis, lumbosacral region: Secondary | ICD-10-CM | POA: Diagnosis not present

## 2023-11-03 DIAGNOSIS — M47816 Spondylosis without myelopathy or radiculopathy, lumbar region: Secondary | ICD-10-CM | POA: Diagnosis not present

## 2023-11-03 DIAGNOSIS — M48061 Spinal stenosis, lumbar region without neurogenic claudication: Secondary | ICD-10-CM | POA: Diagnosis not present

## 2023-11-03 DIAGNOSIS — M419 Scoliosis, unspecified: Secondary | ICD-10-CM | POA: Diagnosis not present

## 2023-11-04 NOTE — Progress Notes (Signed)
Call to patient to go over surgery instructions, including medications. Acknowledged understanding.

## 2023-11-05 ENCOUNTER — Encounter: Payer: Self-pay | Admitting: Podiatry

## 2023-11-05 ENCOUNTER — Other Ambulatory Visit: Payer: Self-pay

## 2023-11-05 ENCOUNTER — Encounter
Admission: RE | Disposition: A | Discharge status: Home / Self Care | Payer: Self-pay | Source: Home / Self Care | Attending: Podiatry

## 2023-11-05 ENCOUNTER — Encounter: Payer: Self-pay | Admitting: General Practice

## 2023-11-05 ENCOUNTER — Ambulatory Visit
Admission: RE | Admit: 2023-11-05 | Discharge: 2023-11-05 | Disposition: A | Discharge status: Home / Self Care | Payer: Medicare Other | Attending: Podiatry | Admitting: Podiatry

## 2023-11-05 DIAGNOSIS — T8484XA Pain due to internal orthopedic prosthetic devices, implants and grafts, initial encounter: Secondary | ICD-10-CM | POA: Insufficient documentation

## 2023-11-05 DIAGNOSIS — I5020 Unspecified systolic (congestive) heart failure: Secondary | ICD-10-CM | POA: Diagnosis not present

## 2023-11-05 DIAGNOSIS — Z87891 Personal history of nicotine dependence: Secondary | ICD-10-CM | POA: Insufficient documentation

## 2023-11-05 DIAGNOSIS — Z01818 Encounter for other preprocedural examination: Secondary | ICD-10-CM

## 2023-11-05 DIAGNOSIS — Y792 Prosthetic and other implants, materials and accessory orthopedic devices associated with adverse incidents: Secondary | ICD-10-CM | POA: Insufficient documentation

## 2023-11-05 DIAGNOSIS — I110 Hypertensive heart disease with heart failure: Secondary | ICD-10-CM | POA: Diagnosis not present

## 2023-11-05 DIAGNOSIS — I2510 Atherosclerotic heart disease of native coronary artery without angina pectoris: Secondary | ICD-10-CM | POA: Diagnosis not present

## 2023-11-05 HISTORY — DX: Unspecified combined systolic (congestive) and diastolic (congestive) heart failure: I50.40

## 2023-11-05 HISTORY — PX: HARDWARE REMOVAL: SHX979

## 2023-11-05 HISTORY — DX: Other complications of anesthesia, initial encounter: T88.59XA

## 2023-11-05 SURGERY — REMOVAL, HARDWARE
Anesthesia: General | Laterality: Left

## 2023-11-05 MED ORDER — BUPIVACAINE HCL (PF) 0.5 % IJ SOLN
INTRAMUSCULAR | Status: AC
  Filled 2023-11-05: qty 30

## 2023-11-05 MED ORDER — HYDROCODONE-ACETAMINOPHEN 5-325 MG PO TABS
1.0000 | ORAL_TABLET | Freq: Four times a day (QID) | ORAL | 0 refills | Status: AC | PRN
  Filled 2023-11-05: qty 10, 3d supply, fill #0

## 2023-11-05 MED ORDER — CEFAZOLIN SODIUM-DEXTROSE 2-4 GM/100ML-% IV SOLN
INTRAVENOUS | Status: AC
  Filled 2023-11-05: qty 100

## 2023-11-05 MED ORDER — 0.9 % SODIUM CHLORIDE (POUR BTL) OPTIME
TOPICAL | Status: DC | PRN
  Administered 2023-11-05: 500 mL

## 2023-11-05 MED ORDER — CHLORHEXIDINE GLUCONATE 0.12 % MT SOLN
OROMUCOSAL | Status: AC
  Filled 2023-11-05: qty 15

## 2023-11-05 MED ORDER — ORAL CARE MOUTH RINSE: 15.0000 mL | Freq: Once | OROMUCOSAL | Status: AC

## 2023-11-05 MED ORDER — PROPOFOL 1000 MG/100ML IV EMUL
INTRAVENOUS | Status: AC
  Filled 2023-11-05: qty 100

## 2023-11-05 MED ORDER — FENTANYL CITRATE (PF) 100 MCG/2ML IJ SOLN
INTRAMUSCULAR | Status: AC
  Filled 2023-11-05: qty 2

## 2023-11-05 MED ORDER — LIDOCAINE HCL (PF) 2 % IJ SOLN
INTRAMUSCULAR | Status: DC | PRN
  Administered 2023-11-05: 60 mg via INTRADERMAL

## 2023-11-05 MED ORDER — DEXMEDETOMIDINE HCL IN NACL 80 MCG/20ML IV SOLN
INTRAVENOUS | Status: DC | PRN
  Administered 2023-11-05: 12 ug via INTRAVENOUS

## 2023-11-05 MED ORDER — ACETAMINOPHEN 10 MG/ML IV SOLN
INTRAVENOUS | Status: DC | PRN
  Administered 2023-11-05: 1000 mg via INTRAVENOUS

## 2023-11-05 MED ORDER — LIDOCAINE HCL (PF) 1 % IJ SOLN
INTRAMUSCULAR | Status: AC
  Filled 2023-11-05: qty 30

## 2023-11-05 MED ORDER — CHLORHEXIDINE GLUCONATE 0.12 % MT SOLN
15.0000 mL | Freq: Once | OROMUCOSAL | Status: AC
  Administered 2023-11-05: 15 mL via OROMUCOSAL

## 2023-11-05 MED ORDER — LIDOCAINE-EPINEPHRINE (PF) 1 %-1:200000 IJ SOLN
INTRAMUSCULAR | Status: DC | PRN
  Administered 2023-11-05: 6 mL

## 2023-11-05 MED ORDER — ACETAMINOPHEN 10 MG/ML IV SOLN
INTRAVENOUS | Status: AC
  Filled 2023-11-05: qty 100

## 2023-11-05 MED ORDER — PROPOFOL 10 MG/ML IV BOLUS
INTRAVENOUS | Status: DC | PRN
  Administered 2023-11-05: 40 mg via INTRAVENOUS
  Administered 2023-11-05: 150 ug/kg/min via INTRAVENOUS

## 2023-11-05 MED ORDER — LIDOCAINE HCL (PF) 2 % IJ SOLN
INTRAMUSCULAR | Status: AC
  Filled 2023-11-05: qty 5

## 2023-11-05 MED ORDER — LIDOCAINE-EPINEPHRINE (PF) 1 %-1:200000 IJ SOLN
INTRAMUSCULAR | Status: AC
  Filled 2023-11-05: qty 30

## 2023-11-05 MED ORDER — SODIUM CHLORIDE 0.9 % IV SOLN: INTRAVENOUS | Status: DC

## 2023-11-05 MED ORDER — CEFAZOLIN SODIUM-DEXTROSE 2-4 GM/100ML-% IV SOLN
2.0000 g | INTRAVENOUS | Status: AC
  Administered 2023-11-05: 2 g via INTRAVENOUS

## 2023-11-05 MED ORDER — MIDAZOLAM HCL 2 MG/2ML IJ SOLN
INTRAMUSCULAR | Status: DC | PRN
  Administered 2023-11-05 (×2): 1 mg via INTRAVENOUS

## 2023-11-05 MED ORDER — MIDAZOLAM HCL 2 MG/2ML IJ SOLN
INTRAMUSCULAR | Status: AC
  Filled 2023-11-05: qty 2

## 2023-11-05 SURGICAL SUPPLY — 24 items
BLADE SURG 15 STRL LF DISP TIS (BLADE) ×2 IMPLANT
BNDG ELASTIC 4X5.8 VLCR NS LF (GAUZE/BANDAGES/DRESSINGS) IMPLANT
BNDG GAUZE DERMACEA FLUFF 4 (GAUZE/BANDAGES/DRESSINGS) IMPLANT
CUFF TOURN SGL QUICK 18X4 (TOURNIQUET CUFF) IMPLANT
DRSG XEROFORM 1X8 (GAUZE/BANDAGES/DRESSINGS) IMPLANT
ELECT REM PT RETURN 9FT ADLT (ELECTROSURGICAL) ×1
ELECTRODE REM PT RTRN 9FT ADLT (ELECTROSURGICAL) ×1 IMPLANT
GAUZE SPONGE 4X4 12PLY STRL (GAUZE/BANDAGES/DRESSINGS) IMPLANT
GLOVE BIO SURGEON STRL SZ7 (GLOVE) ×1 IMPLANT
GLOVE INDICATOR 7.0 STRL GRN (GLOVE) ×1 IMPLANT
GOWN STRL REUS W/ TWL LRG LVL3 (GOWN DISPOSABLE) ×1 IMPLANT
MANIFOLD NEPTUNE II (INSTRUMENTS) ×1 IMPLANT
NDL FILTER BLUNT 18X1 1/2 (NEEDLE) ×1 IMPLANT
NDL HYPO 25X1 1.5 SAFETY (NEEDLE) ×3 IMPLANT
NEEDLE FILTER BLUNT 18X1 1/2 (NEEDLE) ×1
NEEDLE HYPO 25X1 1.5 SAFETY (NEEDLE) ×3
NS IRRIG 500ML POUR BTL (IV SOLUTION) ×1 IMPLANT
PACK EXTREMITY ARMC (MISCELLANEOUS) ×1 IMPLANT
STOCKINETTE STRL 6IN 960660 (GAUZE/BANDAGES/DRESSINGS) ×1 IMPLANT
SUT ETHILON 3-0 FS-10 30 BLK (SUTURE) ×1
SUT VIC AB 4-0 FS2 27 (SUTURE) ×1 IMPLANT
SUTURE EHLN 3-0 FS-10 30 BLK (SUTURE) IMPLANT
SYR 10ML LL (SYRINGE) ×1 IMPLANT
WATER STERILE IRR 500ML POUR (IV SOLUTION) ×1 IMPLANT

## 2023-11-05 NOTE — Transfer of Care (Signed)
Immediate Anesthesia Transfer of Care Note  Patient: Katrina Poole  Procedure(s) Performed: HARDWARE REMOVAL (Left)  Patient Location: PACU  Anesthesia Type:General  Level of Consciousness: awake, alert , and oriented  Airway & Oxygen Therapy: Patient Spontanous Breathing  Post-op Assessment: Report given to RN and Post -op Vital signs reviewed and stable  Post vital signs: Reviewed and stable  Last Vitals:  Vitals Value Taken Time  BP 102/50 11/05/23 1231  Temp    Pulse 63 11/05/23 1235  Resp 16 11/05/23 1235  SpO2 94 % 11/05/23 1235  Vitals shown include unfiled device data.  Last Pain:  Vitals:   11/05/23 1121  PainSc: 3       Patients Stated Pain Goal: 0 (11/05/23 1121)  Complications: No notable events documented.

## 2023-11-05 NOTE — H&P (Signed)
HISTORY AND PHYSICAL INTERVAL NOTE:  11/05/2023  11:53 AM  Katrina Poole  has presented today for surgery, with the diagnosis of T84.84XA - Painful Orthopaedic hardware.  The various methods of treatment have been discussed with the patient.  No guarantees were given.  After consideration of risks, benefits and other options for treatment, the patient has consented to surgery.  I have reviewed the patients' chart and labs.    PROCEDURE: REMOVAL OF HARDWARE LEFT FOOT   A history and physical examination was performed in my office.  The patient was reexamined.  There have been no changes to this history and physical examination.  Rosetta Posner, DPM

## 2023-11-05 NOTE — Anesthesia Procedure Notes (Signed)
Date/Time: 11/05/2023 12:03 PM  Performed by: Ginger Carne, CRNAPre-anesthesia Checklist: Patient identified, Emergency Drugs available, Suction available, Patient being monitored and Timeout performed Patient Re-evaluated:Patient Re-evaluated prior to induction Oxygen Delivery Method: Simple face mask Preoxygenation: Pre-oxygenation with 100% oxygen Induction Type: IV induction

## 2023-11-05 NOTE — Op Note (Addendum)
PODIATRY / FOOT AND ANKLE SURGERY OPERATIVE REPORT    SURGEON: Rosetta Posner, DPM  PRE-OPERATIVE DIAGNOSIS:  1.  Left foot painful orthopedic hardware  POST-OPERATIVE DIAGNOSIS: Same  PROCEDURE(S): Left foot removal of hardware  HEMOSTASIS: No tourniquet  ANESTHESIA: local  ESTIMATED BLOOD LOSS: 2 cc  FINDING(S): 1.  Prominent screw present from previous plate fixation for left midfoot fusion  PATHOLOGY/SPECIMEN(S): None  INDICATIONS:   Katrina Poole is a 78 y.o. female who presents with painful prominent orthopedic hardware after undergoing a midfoot fusion a number of years ago.  She notes that a screws come loose and now is prominent and hurts when things press against the spot.  All treatment options were discussed with the patient both conservative and surgical attempts at correction can potential risks and complications and at this time patient is elected for surgical procedure consisting of left foot hardware removal.  No guarantees given.  Consent obtained prior to procedure..  DESCRIPTION: After obtaining full informed written consent, the patient was brought back to the operating room and placed supine upon the operating table.  The patient received IV antibiotics prior to induction.  The patient was prepped and draped in the standard fashion.  6 cc of 1% lidocaine with epinephrine was injected in a V-type block around the midfoot over the area of the prominent orthopedic hardware.  At this time a small incision was then made over this area over the midfoot where there was the prominent screw that was palpable.  The incision was deepened to the subcutaneous tissues utilizing sharp and blunt dissection and care was taken to notify and retract all vital neurovascular structures.  No venous contributories were seen in this area that need to be cauterized.  At this time a small incision was then made over the top of the screw through the deep fascia and the screw was easily able  to be identified.  The screw appeared to be around a 2.4 millimeter screw but it appeared to be fairly loose.  A rongeur was used to unscrew the screw as it could be gripped with the rongeur and it was spun out of its hole from the plate.  The screw was removed in its entirety.  There did not appear to be any further prominent orthopedic hardware present to the area or obviously loosen screws/hardware.  The surgical site was flushed with copious amounts normal sterile saline.  The subcutaneous tissue was reapproximated well coapted with 4-0 Vicryl and the skin was reapproximated well coapted with 4-0 nylon in a combination of simple and horizontal mattress type stitching.  A postoperative dressing is applied consisting of Xeroform followed by 4 x 4 gauze, gauze roll, Ace wrap.  Patient will be placed in a postoperative shoe in PACU.  The patient tolerated the procedure and anesthesia well and was transferred to recovery vital signs stable vascular status intact all toes of the left foot.  Following a period of  postoperative monitoring the patient be discharged home with the appropriate orders, instructions, and medications.  COMPLICATIONS: None  CONDITION: Good, stable  Rosetta Posner, DPM

## 2023-11-05 NOTE — OR Nursing (Signed)
12mm Screw removed intact by Dr Excell Seltzer

## 2023-11-07 ENCOUNTER — Encounter: Payer: Self-pay | Admitting: Podiatry

## 2023-11-08 ENCOUNTER — Telehealth: Payer: Self-pay | Admitting: Pharmacist

## 2023-11-08 MED ORDER — SACUBITRIL-VALSARTAN 97-103 MG PO TABS: 1.0000 | ORAL_TABLET | Freq: Two times a day (BID) | ORAL | 11 refills | Status: DC

## 2023-11-08 MED ORDER — REPATHA SURECLICK 140 MG/ML ~~LOC~~ SOAJ: 140.0000 mg | SUBCUTANEOUS | 3 refills | Status: DC

## 2023-11-08 MED ORDER — EMPAGLIFLOZIN 10 MG PO TABS: 10.0000 mg | ORAL_TABLET | Freq: Every day | ORAL | 11 refills | Status: DC

## 2023-11-08 MED ORDER — WEGOVY 0.25 MG/0.5ML ~~LOC~~ SOAJ: 0.2500 mg | SUBCUTANEOUS | 0 refills | Status: DC

## 2023-11-08 MED ORDER — CARVEDILOL 12.5 MG PO TABS: 12.5000 mg | ORAL_TABLET | Freq: Two times a day (BID) | ORAL | 3 refills | Status: DC

## 2023-11-08 NOTE — Anesthesia Postprocedure Evaluation (Signed)
Anesthesia Post Note  Patient: Katrina Poole  Procedure(s) Performed: HARDWARE REMOVAL (Left)  Patient location during evaluation: PACU Anesthesia Type: General Level of consciousness: awake and alert Pain management: pain level controlled Vital Signs Assessment: post-procedure vital signs reviewed and stable Respiratory status: spontaneous breathing, nonlabored ventilation, respiratory function stable and patient connected to nasal cannula oxygen Cardiovascular status: blood pressure returned to baseline and stable Postop Assessment: no apparent nausea or vomiting Anesthetic complications: no   No notable events documented.   Last Vitals:  Vitals:   11/05/23 1300 11/05/23 1331  BP: (!) 111/52 116/61  Pulse: (!) 55 60  Resp: 14 16  Temp: (!) 36.3 C (!) 36.4 C  SpO2: 96% 99%    Last Pain:  Vitals:   11/06/23 1354  TempSrc:   PainSc: 0-No pain                 Stephanie Coup

## 2023-11-08 NOTE — Telephone Encounter (Signed)
Patient reports a Data processing manager in [employer] system will not all her to make [employer] her primary insurance. She is working on correcting this. She asked that prescriptions be resent to [facility].

## 2023-11-10 ENCOUNTER — Telehealth: Payer: Self-pay | Admitting: Pharmacist

## 2023-11-10 NOTE — Telephone Encounter (Signed)
 Patient called to inform that Wegovy  is still not covered. Called prior authorization department who reported never receiving the fax. Sent via email this time to [email or web address]

## 2023-11-23 ENCOUNTER — Other Ambulatory Visit (HOSPITAL_COMMUNITY): Payer: Self-pay

## 2023-11-30 DIAGNOSIS — Z1231 Encounter for screening mammogram for malignant neoplasm of breast: Secondary | ICD-10-CM | POA: Diagnosis not present

## 2023-11-30 LAB — HM MAMMOGRAPHY

## 2023-12-02 DIAGNOSIS — M47816 Spondylosis without myelopathy or radiculopathy, lumbar region: Secondary | ICD-10-CM | POA: Diagnosis not present

## 2023-12-08 ENCOUNTER — Telehealth: Payer: Self-pay | Admitting: Pharmacist

## 2023-12-08 MED ORDER — WEGOVY 0.5 MG/0.5ML ~~LOC~~ SOAJ: 0.5000 mg | SUBCUTANEOUS | 0 refills | Status: DC

## 2023-12-08 NOTE — Telephone Encounter (Signed)
 Patient reports no issues with ZOXWRU. Will titrate to 0.5 mg weekly.

## 2023-12-15 DIAGNOSIS — M47816 Spondylosis without myelopathy or radiculopathy, lumbar region: Secondary | ICD-10-CM | POA: Diagnosis not present

## 2023-12-21 DIAGNOSIS — G8929 Other chronic pain: Secondary | ICD-10-CM | POA: Diagnosis not present

## 2023-12-21 DIAGNOSIS — M7062 Trochanteric bursitis, left hip: Secondary | ICD-10-CM | POA: Diagnosis not present

## 2023-12-21 DIAGNOSIS — M25552 Pain in left hip: Secondary | ICD-10-CM | POA: Diagnosis not present

## 2023-12-21 DIAGNOSIS — M545 Low back pain, unspecified: Secondary | ICD-10-CM | POA: Diagnosis not present

## 2023-12-29 ENCOUNTER — Telehealth: Payer: Self-pay | Admitting: Pharmacist

## 2023-12-29 MED ORDER — WEGOVY 1 MG/0.5ML ~~LOC~~ SOAJ: 1.0000 mg | SUBCUTANEOUS | 0 refills | Status: DC

## 2023-12-29 NOTE — Telephone Encounter (Addendum)
 Patient called regarding coverage for Shingles vaccination. Deferred to PCP. Tolerating Wegovy well, will increase to next dose.

## 2023-12-29 NOTE — Addendum Note (Signed)
 Addended by: Marygrace Drought on: 12/29/2023 09:27 AM   Modules accepted: Orders

## 2024-01-05 ENCOUNTER — Other Ambulatory Visit: Payer: Self-pay | Admitting: Internal Medicine

## 2024-01-05 DIAGNOSIS — G2581 Restless legs syndrome: Secondary | ICD-10-CM

## 2024-01-11 DIAGNOSIS — M4317 Spondylolisthesis, lumbosacral region: Secondary | ICD-10-CM | POA: Diagnosis not present

## 2024-01-19 ENCOUNTER — Telehealth: Payer: Self-pay | Admitting: Pharmacist

## 2024-01-19 DIAGNOSIS — H43811 Vitreous degeneration, right eye: Secondary | ICD-10-CM | POA: Diagnosis not present

## 2024-01-19 MED ORDER — WEGOVY 1.7 MG/0.75ML ~~LOC~~ SOAJ: 1.7000 mg | SUBCUTANEOUS | 0 refills | Status: DC

## 2024-01-19 NOTE — Telephone Encounter (Signed)
 Patient called to discuss lack of benefit thus far with [facility]. Discussed with the patient and Katrina Poole was increased to 1.7 mg weekly.

## 2024-01-20 ENCOUNTER — Telehealth: Payer: Self-pay | Admitting: Pharmacist

## 2024-01-20 NOTE — Telephone Encounter (Signed)
 Patient called with questions regarding Wegovy. Questions were answered to patient satisfaction.

## 2024-01-27 DIAGNOSIS — M47816 Spondylosis without myelopathy or radiculopathy, lumbar region: Secondary | ICD-10-CM | POA: Diagnosis not present

## 2024-01-31 ENCOUNTER — Encounter: Payer: Self-pay | Admitting: Pulmonary Disease

## 2024-01-31 ENCOUNTER — Ambulatory Visit: Admitting: Pulmonary Disease

## 2024-01-31 VITALS — BP 120/82 | HR 72 | Temp 96.9°F | Ht <= 58 in | Wt 193.3 lb

## 2024-01-31 DIAGNOSIS — J4520 Mild intermittent asthma, uncomplicated: Secondary | ICD-10-CM

## 2024-01-31 DIAGNOSIS — K219 Gastro-esophageal reflux disease without esophagitis: Secondary | ICD-10-CM | POA: Diagnosis not present

## 2024-01-31 DIAGNOSIS — Z87891 Personal history of nicotine dependence: Secondary | ICD-10-CM | POA: Diagnosis not present

## 2024-01-31 DIAGNOSIS — M5416 Radiculopathy, lumbar region: Secondary | ICD-10-CM | POA: Diagnosis not present

## 2024-01-31 DIAGNOSIS — I428 Other cardiomyopathies: Secondary | ICD-10-CM

## 2024-01-31 DIAGNOSIS — M25552 Pain in left hip: Secondary | ICD-10-CM | POA: Diagnosis not present

## 2024-01-31 DIAGNOSIS — R2689 Other abnormalities of gait and mobility: Secondary | ICD-10-CM | POA: Diagnosis not present

## 2024-01-31 DIAGNOSIS — J4489 Other specified chronic obstructive pulmonary disease: Secondary | ICD-10-CM

## 2024-01-31 DIAGNOSIS — M5459 Other low back pain: Secondary | ICD-10-CM | POA: Diagnosis not present

## 2024-01-31 MED ORDER — AEROCHAMBER MV MISC
0 refills | Status: AC
Start: 2024-01-31 — End: ?

## 2024-01-31 NOTE — Progress Notes (Signed)
 Synopsis: Referred in by Katrina Dixons, MD   Subjective:   PATIENT ID: Katrina Poole GENDER: female DOB: 30-Oct-1945, MRN: [id number]  Chief Complaint  Patient presents with   Follow-up    No SOB, wheezing or cough.     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   Seen initially in August 2024; She has been complaining of shortness of breaht specifically on exertion and going uphill for years. She does report intermittent productive cough. She has hypersensitivity to perfumes but not so much cold air or dust. She was given the diagnosis of asthma early 2000s and has been on albuterol  prn which helps. Now with the recent diagnosis of NICM she unsure what is causing her shortness of breath. She never required hospitalization for asthma nor has she required systemic steroids.   During her last visit in Nov 2024 we started her on Symbicort  160-4.5 2 puffs bid but she hasn't been using it given bad taste. Currently asymptomatic denies any chest tightness, wheezing, shortness of breath or chest pain.   Family History: Mother with COPD (Smoker)   Social History - Quit smoking 30 years ago, smoked 1 and half pack per day for 20 years. Occasional alcohol use. No illicit drug use. 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 No pets at home.   ROS All systems were reviewed and are negative except for the above.  Objective:   There were no vitals filed for this visit.    on RA BMI Readings from Last 3 Encounters:  11/05/23 41.47 kg/m  10/15/23 39.89 kg/m  10/12/23 39.06 kg/m   Wt Readings from Last 3 Encounters:  11/05/23 198 lb 6.6 oz (90 kg)  10/15/23 197 lb 8 oz (89.6 kg)  10/12/23 193 lb 6.4 oz (87.7 kg)     Physical Exam GEN: NAD, Healthy Appearing HEENT: Supple Neck, Reactive Pupils, EOMI  CVS: Normal S1, Normal S2, RRR, No murmurs or ES appreciated  Lungs: Clear bilateral air entry.  Abdomen: Soft, non tender, non distended, + BS  Extremities: Warm and well perfused, No edema  Skin: No suspicious lesions appreciated  Psych: Normal Affect  Ancillary Information   CBC    Component Value Date/Time   WBC 7.9 08/23/2023 1034   RBC 3.92 08/23/2023 1034   HGB 12.2 08/23/2023 1034   HGB 11.5 10/23/2021 0944   HCT 36.9 08/23/2023 1034   HCT 33.9 (L) 10/23/2021 0944   PLT 292 08/23/2023 1034   PLT 282 10/23/2021 0944   MCV 94.1 08/23/2023 1034   MCV 89 10/23/2021 0944   MCH 31.1 08/23/2023 1034   MCHC 33.1 08/23/2023 1034   RDW 12.8 08/23/2023 1034   RDW 13.1 10/23/2021 0944   LYMPHSABS 1.6 08/23/2023 1034   LYMPHSABS 1.0 10/23/2021 0944   MONOABS 0.7 08/23/2023 1034   EOSABS 0.2 08/23/2023 1034   EOSABS 0.1 10/23/2021 0944   BASOSABS 0.1 08/23/2023 1034   BASOSABS 0.1 10/23/2021 0944    Imaging   PFTs  08/12/2023 FEV1 normal at 1.47 L within normal limits, FVC at 1.85 L also within normal limits FEV1 to FVC ratio normal at 80.  No significant change postbronchodilator.  However there was somewhat observed changed with FEV1 by 0.11 L +7% change as well  as FVC at 0.13 L +7% change.  Total lung volume was normal at 4.46 L 100% of predicted RV was slightly elevated at 106% of predicted possibly showing some air trapping.  DLCO was mildly reduced at 70% of predicted however when corrected to alveolar ventilation at 78% of predicted.  CXR May 2024: Diffuse interstitial and alveolar opacities consistent with pulmonary edema.  Echocardiogram 04/19:   1. Left ventricular ejection fraction, by estimation, is 25 to 30%. Left  ventricular ejection fraction by 2D MOD biplane is 30.8 %. The left  ventricle has severely decreased function. The left ventricle demonstrates  global  hypokinesis. The left  ventricular internal cavity size was moderately dilated. Left ventricular  diastolic parameters are consistent with Grade I diastolic dysfunction  (impaired relaxation). The average left ventricular global longitudinal  strain is -12.3 %.   2. Right ventricular systolic function is normal. The right ventricular  size is normal. Tricuspid regurgitation signal is inadequate for assessing  PA pressure.   3. Left atrial size was moderately dilated.   4. The mitral valve is normal in structure. Moderate mitral valve  regurgitation. No evidence of mitral stenosis.   5. The aortic valve is normal in structure. Aortic valve regurgitation is  not visualized. No aortic stenosis is present.   6. The inferior vena cava is normal in size with greater than 50%  respiratory variability, suggesting right atrial pressure of 3 mmHg.        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   # Mild intermittent asthma (FENO 11) #COPD overlap Gold stage I # Nonischemic cardiomyopathy with EF 25 to 30% 04/19 [facility] 02/2023 w/ NOCAD. Cardiac MR with no sings infiltrative diseases.  #GERD #Morbid Obesity  Her dyspnea on exertion could be multifactorial in the setting of Non ischemic CM, mild  intermittent asthma, Morbid Obesity and GERD.   We discussed the importance of weightloss and its impact on asthma and multiple other comorbidities. I think she would highly benefit from weight loss strategies including Ozempic. I advised her to discuss that with her PCP.   []  Ok to use budesonide -Formoterol  [Symbicort ] 160-4.5 2 puffs BID as needed but advised that should symptoms recur than start using it on a scheduled basis. Provided Aerochamber MV as well for easier use.   []  C/w Albuterol  as needed  []  C/w PPI  []  continue working on weightloss strategies.  RTC 6 months.  I spent 30 minutes caring for this patient today, including preparing to see the patient, obtaining a medical history ,  reviewing a separately obtained history, performing a medically appropriate examination and/or evaluation, counseling and educating the patient/family/caregiver, ordering medications, tests, or procedures, documenting clinical information in the electronic health record, and independently interpreting results (not separately reported/billed) and communicating results to the patient/family/caregiver  Annitta Kindler, MD [facility] 01/31/2024 8:46 AM

## 2024-02-01 ENCOUNTER — Telehealth: Payer: Self-pay | Admitting: Cardiology

## 2024-02-01 NOTE — Telephone Encounter (Signed)
 Called to confirm/remind patient of their appointment [facility] on 02/02/24.   Appointment:   [x] Confirmed  [] Left mess   [] No answer/No voice mail  [] VM Full/unable to leave message  [] Phone not in service  Patient reminded to bring all medications and/or complete list.  Confirmed patient has transportation. Gave directions, instructed to utilize valet parking.

## 2024-02-02 ENCOUNTER — Encounter: Payer: Self-pay | Admitting: Cardiology

## 2024-02-02 ENCOUNTER — Ambulatory Visit
Admission: RE | Admit: 2024-02-02 | Discharge: 2024-02-02 | Disposition: A | Discharge status: Home / Self Care | Source: Ambulatory Visit | Attending: Cardiology | Admitting: Cardiology

## 2024-02-02 ENCOUNTER — Ambulatory Visit: Admitting: Cardiology

## 2024-02-02 VITALS — BP 122/66 | HR 77 | Wt 198.5 lb

## 2024-02-02 DIAGNOSIS — I5022 Chronic systolic (congestive) heart failure: Secondary | ICD-10-CM | POA: Diagnosis not present

## 2024-02-02 DIAGNOSIS — J45909 Unspecified asthma, uncomplicated: Secondary | ICD-10-CM | POA: Insufficient documentation

## 2024-02-02 DIAGNOSIS — E669 Obesity, unspecified: Secondary | ICD-10-CM | POA: Diagnosis not present

## 2024-02-02 DIAGNOSIS — I110 Hypertensive heart disease with heart failure: Secondary | ICD-10-CM | POA: Diagnosis not present

## 2024-02-02 DIAGNOSIS — I5042 Chronic combined systolic (congestive) and diastolic (congestive) heart failure: Secondary | ICD-10-CM

## 2024-02-02 DIAGNOSIS — Z79899 Other long term (current) drug therapy: Secondary | ICD-10-CM | POA: Diagnosis not present

## 2024-02-02 DIAGNOSIS — I2510 Atherosclerotic heart disease of native coronary artery without angina pectoris: Secondary | ICD-10-CM | POA: Diagnosis not present

## 2024-02-02 DIAGNOSIS — I428 Other cardiomyopathies: Secondary | ICD-10-CM | POA: Insufficient documentation

## 2024-02-02 DIAGNOSIS — Z7984 Long term (current) use of oral hypoglycemic drugs: Secondary | ICD-10-CM | POA: Insufficient documentation

## 2024-02-02 DIAGNOSIS — Z87891 Personal history of nicotine dependence: Secondary | ICD-10-CM | POA: Insufficient documentation

## 2024-02-02 MED ORDER — CARVEDILOL 12.5 MG PO TABS: 18.7500 mg | ORAL_TABLET | Freq: Two times a day (BID) | ORAL | 3 refills | Status: AC

## 2024-02-02 NOTE — Patient Instructions (Signed)
 Medication Changes:  INCREASE Carvedilol  18.75 (1.5 tab) two times daily  Lab Work:  Go DOWN to LOWER LEVEL (LL) to have your blood work completed inside of [employer] office.   We will only call you if the results are abnormal or if the provider would like to make medication changes.   Testing/Procedures:  Your physician has requested that you have an echocardiogram. Echocardiography is a painless test that uses sound waves to create images of your heart. It provides your doctor with information about the size and shape of your heart and how well your heart's chambers and valves are working. This procedure takes approximately one hour. There are no restrictions for this procedure. Please do NOT wear cologne, perfume, aftershave, or lotions (deodorant is allowed). Please arrive 15 minutes prior to your appointment time.  Please note: We ask at that you not bring children with you during ultrasound (echo/ vascular) testing. Due to room size and safety concerns, children are not allowed in the ultrasound rooms during exams. Our front office staff cannot provide observation of children in our lobby area while testing is being conducted. An adult accompanying a patient to their appointment will only be allowed in the ultrasound room at the discretion of the ultrasound technician under special circumstances. We apologize for any inconvenience.   Follow-Up in: Please follow up with the [facility] in 4 months. We do not currently have that schedule. Please call us  in July in order to schedule an appointment for August.  At the [facility], you and your health needs are our priority. We have a designated team specialized in the treatment of Heart Failure. This Care Team includes your primary Heart Failure Specialized Cardiologist (physician), Advanced Practice Providers (APPs- Physician Assistants and Nurse Practitioners), and Pharmacist who all work together to  provide you with the care you need, when you need it.   You may see any of the following providers on your designated Care Team at your next follow up:  Dr. Jules Oar Dr. Peder Bourdon Dr. Alwin Baars Dr. Judyth Nunnery Shawnee Dellen, FNP Bevely Brush, RPH-CPP  Please be sure to bring in all your medications bottles to every appointment.   Need to Contact Us :  If you have any questions or concerns before your next appointment please send us  a message through [place] or call our office at [phone].    TO LEAVE A MESSAGE FOR THE NURSE SELECT OPTION 2, PLEASE LEAVE A MESSAGE INCLUDING: YOUR NAME DATE OF BIRTH CALL BACK NUMBER REASON FOR CALL**this is important as we prioritize the call backs  YOU WILL RECEIVE A CALL BACK THE SAME DAY AS LONG AS YOU CALL BEFORE 4:00 PM

## 2024-02-02 NOTE — Progress Notes (Signed)
 PCP: Sheron Dixons, MD Cardiology: Dr. Junnie Olives HF Cardiology: Dr. Mitzie Anda  Chief complaint: CHF  78 y.o.with history of HTN and osteoarthritis was found to have a cardiomyopathy in 4/24.  Patient reports 2 prior cardiac caths several years ago, one at [facility] and one at [facility].  Neither showed obstructive CAD.  She does have a strong family history of CAD (multiple siblings with MIs/CAD).  She never had echoes with prior workups per her report.  Patient was found to have a LBBB, so echo was ordered by Dr. Junnie Olives.  Echo was done in 4/24 and showed EF 25-30%, moderate LV dilation, normal RV, and moderate MR.  Due to low EF, she was set up for LHC that was done in 5/24.  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  Patient was short of breath and was put on Bipap.  She was markedly hypertensive.  She was admitted overnight and diuresed.   Cardiac MRI in 7/24 showed normal LV size with EF 38%, septal-lateral dyssynchrony, RV EF 56%, no LGE, ECV 28%.    Patient returns for followup of CHF. Weight up 1 lb.  Main complaint remains low back pain.  She sees a Midwife and has had back injections.  She is on semaglutide  but has not been able to lose significant weight. Breathing is stable, generally no exertional dyspnea with usual activities but has trouble when it is humid outside.  Dyspnea with stairs and hills.  No chest pain.  No lightheadedness.  No orthopnea/PND.  She continues to enjoy quilting and is very active with this.     ECG (personally reviewed): NSR, LBBB 130 msec  Labs (5/24): K 4.2, creatinine 1.2, LDL 107 Labs (6/24): BNP 75, K 4.8, creatinine 1.31 Labs (7/24): K 4.6, creatinine 1.0 Labs (10/24): BNP 87, creatinine 0.81 Labs (1/25): LDL 50, BNP 22, K 4.9, creatinine 1.06  PMH: 1. LBBB 2. CAD: LHC ([facility] in 2013) with 50% LAD stenosis.  - LHC (5/24): 30% pLAD, 40% mLAD.  3. Chronic systolic CHF: Nonischemic cardiomyopathy.  - Echo  (4/24): EF 25-30%, normal RV, moderate MR.  - Cardiac MRI (7/24): normal LV size with EF 38%, septal-lateral dyssynchrony, RV EF 56%, no LGE, ECV 28%.  4. HTN 5. Meniere's disease 6. Lumbar spinal fusion 1/24 7. OA 8. Asthma 9. Hyperlipidemia: Leg weakness with statins.  10. Low back pain.   Social History   Socioeconomic History   Marital status: Married    Spouse name: Pamella Boer   Number of children: 1   Years of education: Not on file   Highest education level: Not on file  Occupational History   Occupation: Part time  Tobacco Use   Smoking status: Former    Current packs/day: 0.00    Average packs/day: 1.5 packs/day for 30.0 years (45.0 ttl pk-yrs)    Types: Cigarettes    Start date: 11/12/1960    Quit date: 11/12/1990    Years since quitting: 33.2   Smokeless tobacco: Never  Vaping Use   Vaping status: Never Used  Substance and Sexual Activity   Alcohol use: Yes    Alcohol/week: 1.0 standard drink of alcohol    Types: 1 Standard drinks or equivalent per week    Comment: OCC [place], maybe 1 drink a week   Drug use: No   Sexual activity: Not Currently  Other Topics Concern   Not on file  Social History Narrative   Not on  file   Social Drivers of Health   Financial Resource Strain: Low Risk  (12/21/2023)   Received from [facility]   Overall Financial Resource Strain (CARDIA)    Difficulty of Paying Living Expenses: Not hard at all  Food Insecurity: No Food Insecurity (12/21/2023)   Received from [facility]   Hunger Vital Sign    Worried About Running Out of Food in the Last Year: Never true    Ran Out of Food in the Last Year: Never true  Transportation Needs: No Transportation Needs (12/21/2023)   Received from [facility] - Transportation    In the past 12 months, has lack of transportation kept you from medical appointments or from getting medications?: No    Lack of Transportation  (Non-Medical): No  Physical Activity: Insufficiently Active (10/20/2023)   Exercise Vital Sign    Days of Exercise per Week: 5 days    Minutes of Exercise per Session: 20 min  Stress: No Stress Concern Present (10/20/2023)   [employer] of Occupational Health - Occupational Stress Questionnaire    Feeling of Stress : Not at all  Social Connections: Moderately Integrated (10/20/2023)   Social Connection and Isolation Panel [NHANES]    Frequency of Communication with Friends and Family: More than three times a week    Frequency of Social Gatherings with Friends and Family: Once a week    Attends Religious Services: Never    Database administrator or Organizations: Yes    Attends Engineer, structural: More than 4 times per year    Marital Status: Married  Catering manager Violence: Not At Risk (10/20/2023)   Humiliation, Afraid, Rape, and Kick questionnaire    Fear of Current or Ex-Partner: No    Emotionally Abused: No    Physically Abused: No    Sexually Abused: No   FH: Brother died at 45 of MI, brother died at 25 of MI, brother died in 103s of CVA, mother with CHF.   ROS: All systems reviewed and negative except as per HPI.   Current Outpatient Medications  Medication Sig Dispense Refill   acetaminophen  (TYLENOL ) 500 MG tablet Take 2 tablets (1,000 mg total) by mouth every 6 (six) hours as needed (DO NOT EXCEED 4000 MG DAILY FROM ALL SOURCES). (Patient taking differently: Take 1,000 mg by mouth daily.) 30 tablet 2   albuterol  (VENTOLIN  HFA) 108 (90 Base) MCG/ACT inhaler Inhale 2 puffs into the lungs every 6 (six) hours as needed for wheezing or shortness of breath. 18 g 1   alum hydroxide-mag trisilicate (GAVISCON ) 80-20 MG CHEW chewable tablet Chew 1 tablet by mouth daily as needed for indigestion or heartburn.     budesonide -formoterol  (SYMBICORT ) 160-4.5 MCG/ACT inhaler Inhale 2 puffs into the lungs in the morning and at bedtime. 1 each 12   cephALEXin  (KEFLEX ) 500 MG  capsule TAKE 4 PRIOR TO DENTAL PROCEDURES 4 capsule 1   clonazePAM  (KLONOPIN ) 0.5 MG tablet TAKE 1 TABLET AT BEDTIME AS NEEDED FOR ANXIETY 30 tablet 0   docusate sodium  (COLACE) 100 MG capsule Take 100 mg by mouth every other day.     empagliflozin  (JARDIANCE ) 10 MG TABS tablet Take 1 tablet (10 mg total) by mouth daily before breakfast. 30 tablet 11   Evolocumab  (REPATHA  SURECLICK) 140 MG/ML SOAJ Inject 140 mg into the skin every 14 (fourteen) days. 2 mL 3   furosemide  (LASIX ) 20 MG tablet Take 1 tablet (20 mg  total) by mouth daily as needed (swelling, shortness of breath, gain 3 lbs overnight or 5 lbs in a week). 30 tablet 4   gabapentin  (NEURONTIN ) 300 MG capsule Take 2 capsules (600 mg total) by mouth at bedtime. 180 capsule 1   meclizine  (ANTIVERT ) 12.5 MG tablet Take 12.5 mg by mouth 3 (three) times daily as needed for dizziness. Dr. Loretta Romp     Multiple Vitamin (MULTIVITAMIN) tablet Take 1 tablet by mouth every morning.     omeprazole  (PRILOSEC) 20 MG capsule Take 1 capsule (20 mg total) by mouth 2 (two) times daily before a meal. 180 capsule 1   potassium chloride  (KLOR-CON  M) 10 MEQ tablet Take 1 tablet (10 mEq total) by mouth as needed (with lasix ). 30 tablet 5   Probiotic Product (PROBIOTIC DAILY PO) Take 1 capsule by mouth daily.     sacubitril -valsartan  (ENTRESTO ) 97-103 MG Take 1 tablet by mouth 2 (two) times daily. 60 tablet 11   Semaglutide -Weight Management (WEGOVY ) 1.7 MG/0.75ML SOAJ Inject 1.7 mg into the skin once a week. 3 mL 0   Spacer/Aero-Holding Chambers (AEROCHAMBER MV) inhaler Use as instructed 1 each 0   spironolactone  (ALDACTONE ) 25 MG tablet Take 1 tablet (25 mg total) by mouth daily. 90 tablet 3   traMADol  (ULTRAM ) 50 MG tablet Take 50 mg by mouth daily.     VITAMIN D  PO Take 5,000 Units by mouth daily.     zolpidem  (AMBIEN ) 5 MG tablet Take 1 tablet (5 mg total) by mouth at bedtime. 90 tablet 1   carvedilol  (COREG ) 12.5 MG tablet Take 1.5 tablets (18.75 mg total) by  mouth 2 (two) times daily. 270 tablet 3   No current facility-administered medications for this visit.   BP 122/66   Pulse 77   Wt 198 lb 8 oz (90 kg)   SpO2 94%   BMI 41.49 kg/m  General: NAD Neck: No JVD, no thyromegaly or thyroid  nodule.  Lungs: Clear to auscultation bilaterally with normal respiratory effort. CV: Nondisplaced PMI.  Heart regular S1/S2, no S3/S4, no murmur.  No peripheral edema.  No carotid bruit.  Normal pedal pulses.  Abdomen: Soft, nontender, no hepatosplenomegaly, no distention.  Skin: Intact without lesions or rashes.  Neurologic: Alert and oriented x 3.  Psych: Normal affect. Extremities: No clubbing or cyanosis.  HEENT: Normal.   Assessment/Plan: 1. Chronic systolic CHF: Nonischemic cardiomyopathy.  No heavy ETOH/drugs.  No FH of dilated cardiomyopathy (has FH of CAD).  Patient was found to have LBBB, leading to echo in 4/24 showing EF 25-30%, moderate LV dilation, normal RV, and moderate MR.  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  Cardiac MRI in 7/24 showed normal LV size with EF 38%, septal-lateral dyssynchrony, RV EF 56%, no delayed enhancement, ECV 28%. NYHA class II, not volume overloaded on exam.   - Keep Lasix  prn.  - Continue Entresto  97/103 bid.  - Continue spironolactone  25 mg daily. BMET/BNP today.  - Continue Farxiga  10 mg daily.  - Increase Coreg  to 18.75 mg bid. - She has LBBB 130 msec on last ECG, This does not predict significant benefit from CRT, but she is noted to have septal-lateral dyssynchrony on imaging.  For now, this is a moot point as her EF was > 35% by cardiac MRI. She is due for repeat echo, I will arrange (she would like a female sonographer).  2. CAD: Nonobstructive.  Strong FH of MI. Leg weakness with  statins, recurrent with Crestor .  - Continue Repatha , good lipids in 1/25.  3. HTN: BP controlled.   4. Asthma: Followed by pulmonary.  5. Obesity: Continue semaglutide .   - She  has not had much weight loss, will see if we can transition to Mounjaro.   Followup 4 months.   I spent 32 minutes reviewing records, interviewing/examining patient, and managing orders.    Peder Bourdon 02/02/2024

## 2024-02-03 LAB — BASIC METABOLIC PANEL WITH GFR
BUN/Creatinine Ratio: 25 (ref 12–28)
BUN: 26 mg/dL (ref 8–27)
CO2: 22 mmol/L (ref 20–29)
Calcium: 9.7 mg/dL (ref 8.7–10.3)
Chloride: 103 mmol/L (ref 96–106)
Creatinine, Ser: 1.05 mg/dL — ABNORMAL HIGH (ref 0.57–1.00)
Glucose: 76 mg/dL (ref 70–99)
Potassium: 4.9 mmol/L (ref 3.5–5.2)
Sodium: 141 mmol/L (ref 134–144)
eGFR: 55 mL/min/{1.73_m2} — ABNORMAL LOW (ref >59)

## 2024-02-03 LAB — BRAIN NATRIURETIC PEPTIDE: BNP: 39 pg/mL (ref 0.0–100.0)

## 2024-02-07 DIAGNOSIS — M5416 Radiculopathy, lumbar region: Secondary | ICD-10-CM | POA: Diagnosis not present

## 2024-02-07 DIAGNOSIS — M25552 Pain in left hip: Secondary | ICD-10-CM | POA: Diagnosis not present

## 2024-02-07 DIAGNOSIS — M5459 Other low back pain: Secondary | ICD-10-CM | POA: Diagnosis not present

## 2024-02-07 DIAGNOSIS — R2689 Other abnormalities of gait and mobility: Secondary | ICD-10-CM | POA: Diagnosis not present

## 2024-02-09 DIAGNOSIS — M5416 Radiculopathy, lumbar region: Secondary | ICD-10-CM | POA: Diagnosis not present

## 2024-02-09 DIAGNOSIS — R2689 Other abnormalities of gait and mobility: Secondary | ICD-10-CM | POA: Diagnosis not present

## 2024-02-09 DIAGNOSIS — M25552 Pain in left hip: Secondary | ICD-10-CM | POA: Diagnosis not present

## 2024-02-09 DIAGNOSIS — M5459 Other low back pain: Secondary | ICD-10-CM | POA: Diagnosis not present

## 2024-02-16 ENCOUNTER — Telehealth: Payer: Self-pay | Admitting: Pharmacist

## 2024-02-16 MED ORDER — WEGOVY 2.4 MG/0.75ML ~~LOC~~ SOAJ: 2.4000 mg | SUBCUTANEOUS | 11 refills | Status: DC

## 2024-02-16 NOTE — Telephone Encounter (Signed)
Wegovy increased to 2.4 mg weekly

## 2024-02-17 ENCOUNTER — Other Ambulatory Visit: Payer: Self-pay | Admitting: Internal Medicine

## 2024-02-17 DIAGNOSIS — G2581 Restless legs syndrome: Secondary | ICD-10-CM

## 2024-02-18 ENCOUNTER — Telehealth (HOSPITAL_COMMUNITY): Payer: Self-pay

## 2024-02-18 DIAGNOSIS — M5416 Radiculopathy, lumbar region: Secondary | ICD-10-CM | POA: Diagnosis not present

## 2024-02-18 DIAGNOSIS — M5459 Other low back pain: Secondary | ICD-10-CM | POA: Diagnosis not present

## 2024-02-18 DIAGNOSIS — R2689 Other abnormalities of gait and mobility: Secondary | ICD-10-CM | POA: Diagnosis not present

## 2024-02-18 DIAGNOSIS — M25552 Pain in left hip: Secondary | ICD-10-CM | POA: Diagnosis not present

## 2024-02-21 ENCOUNTER — Ambulatory Visit (HOSPITAL_COMMUNITY)
Admission: RE | Admit: 2024-02-21 | Discharge: 2024-02-21 | Disposition: A | Discharge status: Home / Self Care | Source: Ambulatory Visit | Attending: Surgery | Admitting: Surgery

## 2024-02-21 ENCOUNTER — Ambulatory Visit (HOSPITAL_COMMUNITY): Payer: Self-pay | Admitting: Cardiology

## 2024-02-21 DIAGNOSIS — I340 Nonrheumatic mitral (valve) insufficiency: Secondary | ICD-10-CM

## 2024-02-21 DIAGNOSIS — I5042 Chronic combined systolic (congestive) and diastolic (congestive) heart failure: Secondary | ICD-10-CM | POA: Insufficient documentation

## 2024-02-21 HISTORY — DX: Nonrheumatic mitral (valve) insufficiency: I34.0

## 2024-02-21 LAB — ECHOCARDIOGRAM COMPLETE
AR max vel: 1 cm2
AV Area VTI: 1.03 cm2
AV Area mean vel: 1.02 cm2
AV Mean grad: 6.5 mmHg
AV Peak grad: 12 mmHg
Ao pk vel: 1.73 m/s
Area-P 1/2: 3.77 cm2
Est EF: 55
MV M vel: 2.42 m/s
MV Peak grad: 23.4 mmHg
S' Lateral: 3.99 cm

## 2024-02-21 NOTE — Telephone Encounter (Signed)
 error

## 2024-02-21 NOTE — Telephone Encounter (Signed)
 Please review

## 2024-02-21 NOTE — Telephone Encounter (Signed)
 Requested medication (s) are due for refill today: yes  Requested medication (s) are on the active medication list: yes  Last refill:  01/06/24 #30  Future visit scheduled: yes  Notes to clinic:  med not delegated to NT to RF   Requested Prescriptions  Pending Prescriptions Disp Refills   clonazePAM  (KLONOPIN ) 0.5 MG tablet [Pharmacy Med Name: CLONAZEPAM  TABS 0.5MG ] 30 tablet 0    Sig: TAKE 1 TABLET AT BEDTIME AS NEEDED FOR ANXIETY     Not Delegated - Psychiatry: Anxiolytics/Hypnotics 2 Failed - 02/21/2024 11:42 AM      Failed - This refill cannot be delegated      Failed - Urine Drug Screen completed in last 360 days      Failed - Valid encounter within last 6 months    Recent Outpatient Visits   None     Future Appointments             In 1 month Gala Jubilee, [place]ales [place], MD [facility] Primary Care & Sports Medicine at [facility], [facility]            Passed - Patient is not pregnant

## 2024-02-23 ENCOUNTER — Ambulatory Visit: Payer: Medicare Other | Admitting: Pulmonary Disease

## 2024-03-03 DIAGNOSIS — M25552 Pain in left hip: Secondary | ICD-10-CM | POA: Diagnosis not present

## 2024-03-03 DIAGNOSIS — M5416 Radiculopathy, lumbar region: Secondary | ICD-10-CM | POA: Diagnosis not present

## 2024-03-03 DIAGNOSIS — R2689 Other abnormalities of gait and mobility: Secondary | ICD-10-CM | POA: Diagnosis not present

## 2024-03-03 DIAGNOSIS — M5459 Other low back pain: Secondary | ICD-10-CM | POA: Diagnosis not present

## 2024-03-07 DIAGNOSIS — M47816 Spondylosis without myelopathy or radiculopathy, lumbar region: Secondary | ICD-10-CM | POA: Diagnosis not present

## 2024-03-08 ENCOUNTER — Telehealth: Payer: Self-pay | Admitting: Pharmacist

## 2024-03-08 MED ORDER — ZEPBOUND 5 MG/0.5ML ~~LOC~~ SOAJ: 5.0000 mg | SUBCUTANEOUS | 0 refills | Status: DC

## 2024-03-08 MED ORDER — SACUBITRIL-VALSARTAN 97-103 MG PO TABS: 1.0000 | ORAL_TABLET | Freq: Two times a day (BID) | ORAL | 3 refills | Status: DC

## 2024-03-08 NOTE — Telephone Encounter (Addendum)
 Patient called to relay no weight loss on semaglutide  and would like to transition to Zepbound. She also would prefer 90 day supplies of Entresto . Prescriptions have been sent.

## 2024-03-10 DIAGNOSIS — M5459 Other low back pain: Secondary | ICD-10-CM | POA: Diagnosis not present

## 2024-03-10 DIAGNOSIS — M5416 Radiculopathy, lumbar region: Secondary | ICD-10-CM | POA: Diagnosis not present

## 2024-03-10 DIAGNOSIS — M25552 Pain in left hip: Secondary | ICD-10-CM | POA: Diagnosis not present

## 2024-03-10 DIAGNOSIS — R2689 Other abnormalities of gait and mobility: Secondary | ICD-10-CM | POA: Diagnosis not present

## 2024-03-13 DIAGNOSIS — M5416 Radiculopathy, lumbar region: Secondary | ICD-10-CM | POA: Diagnosis not present

## 2024-03-13 DIAGNOSIS — R2689 Other abnormalities of gait and mobility: Secondary | ICD-10-CM | POA: Diagnosis not present

## 2024-03-13 DIAGNOSIS — M5459 Other low back pain: Secondary | ICD-10-CM | POA: Diagnosis not present

## 2024-03-13 DIAGNOSIS — M25552 Pain in left hip: Secondary | ICD-10-CM | POA: Diagnosis not present

## 2024-03-14 DIAGNOSIS — M47816 Spondylosis without myelopathy or radiculopathy, lumbar region: Secondary | ICD-10-CM | POA: Diagnosis not present

## 2024-03-16 ENCOUNTER — Telehealth: Payer: Self-pay | Admitting: Pharmacist

## 2024-03-16 MED ORDER — SACUBITRIL-VALSARTAN 97-103 MG PO TABS: 1.0000 | ORAL_TABLET | Freq: Two times a day (BID) | ORAL | 3 refills | Status: AC

## 2024-03-17 NOTE — Telephone Encounter (Signed)
 Prior authorization completed and approved for Zepbound  via telephone. Patient called and notified.

## 2024-03-23 ENCOUNTER — Other Ambulatory Visit: Payer: Self-pay | Admitting: Internal Medicine

## 2024-03-23 DIAGNOSIS — F5101 Primary insomnia: Secondary | ICD-10-CM

## 2024-03-23 DIAGNOSIS — G2581 Restless legs syndrome: Secondary | ICD-10-CM

## 2024-03-23 DIAGNOSIS — K219 Gastro-esophageal reflux disease without esophagitis: Secondary | ICD-10-CM

## 2024-03-27 DIAGNOSIS — M25552 Pain in left hip: Secondary | ICD-10-CM | POA: Diagnosis not present

## 2024-03-27 DIAGNOSIS — R2689 Other abnormalities of gait and mobility: Secondary | ICD-10-CM | POA: Diagnosis not present

## 2024-03-27 DIAGNOSIS — M5416 Radiculopathy, lumbar region: Secondary | ICD-10-CM | POA: Diagnosis not present

## 2024-03-27 DIAGNOSIS — M5459 Other low back pain: Secondary | ICD-10-CM | POA: Diagnosis not present

## 2024-03-27 NOTE — Telephone Encounter (Signed)
 Please review.  KP

## 2024-03-27 NOTE — Telephone Encounter (Signed)
 Requested medications are due for refill today.  yes  Requested medications are on the active medications list.  yes  Last refill. Klonopin  02/21/2024 #30 0 rf, Ambien  10/12/2023 #90 1 rf  Future visit scheduled.   yes  Notes to clinic.  Refill not delegated.    Requested Prescriptions  Pending Prescriptions Disp Refills   clonazePAM  (KLONOPIN ) 0.5 MG tablet [Pharmacy Med Name: CLONAZEPAM  TABS 0.5MG ] 30 tablet 0    Sig: TAKE 1 TABLET AT BEDTIME AS NEEDED FOR ANXIETY     Not Delegated - Psychiatry: Anxiolytics/Hypnotics 2 Failed - 03/27/2024  2:52 PM      Failed - This refill cannot be delegated      Failed - Urine Drug Screen completed in last 360 days      Failed - Valid encounter within last 6 months    Recent Outpatient Visits   None     Future Appointments             In 2 weeks Justus, Leita DEL, MD [facility] Primary Care & Sports Medicine at [facility], [facility]            Passed - Patient is not pregnant       zolpidem  (AMBIEN ) 5 MG tablet [Pharmacy Med Name: ZOLPIDEM  TARTRATE TABS 5MG ] 90 tablet 1    Sig: TAKE 1 TABLET AT BEDTIME     Not Delegated - Psychiatry:  Anxiolytics/Hypnotics Failed - 03/27/2024  2:52 PM      Failed - This refill cannot be delegated      Failed - Urine Drug Screen completed in last 360 days      Failed - Valid encounter within last 6 months    Recent Outpatient Visits   None     Future Appointments             In 2 weeks Justus Leita DEL, MD [facility] Primary Care & Sports Medicine at [facility], [facility]            Signed Prescriptions Disp Refills   omeprazole  (PRILOSEC) 20 MG capsule 180 capsule 1    Sig: TAKE 1 CAPSULE TWICE A DAY BEFORE MEALS     Gastroenterology: Proton Pump Inhibitors Failed - 03/27/2024  2:52 PM      Failed - Valid encounter within last 12 months    Recent Outpatient Visits   None     Future Appointments             In 2 weeks Justus Leita DEL, MD [facility] Primary Care & Sports  Medicine at [facility], [facility]             gabapentin  (NEURONTIN ) 300 MG capsule 180 capsule 1    Sig: TAKE 2 CAPSULES AT BEDTIME     Neurology: Anticonvulsants - gabapentin  Failed - 03/27/2024  2:52 PM      Failed - Cr in normal range and within 360 days    Creatinine, Ser  Date Value Ref Range Status  02/02/2024 1.05 (H) 0.57 - 1.00 mg/dL Final         Failed - Valid encounter within last 12 months    Recent Outpatient Visits   None     Future Appointments             In 2 weeks Justus, Leita DEL, MD [facility] Primary Care & Sports Medicine at [facility], [facility]            Passed - Completed PHQ-2  or PHQ-9 in the last 360 days

## 2024-03-27 NOTE — Telephone Encounter (Signed)
 Requested Prescriptions  Pending Prescriptions Disp Refills   clonazePAM  (KLONOPIN ) 0.5 MG tablet [Pharmacy Med Name: CLONAZEPAM  TABS 0.5MG ] 30 tablet 0    Sig: TAKE 1 TABLET AT BEDTIME AS NEEDED FOR ANXIETY     Not Delegated - Psychiatry: Anxiolytics/Hypnotics 2 Failed - 03/27/2024  2:51 PM      Failed - This refill cannot be delegated      Failed - Urine Drug Screen completed in last 360 days      Failed - Valid encounter within last 6 months    Recent Outpatient Visits   None     Future Appointments             In 2 weeks Justus, Leita DEL, MD [facility] Health Primary Care & Sports Medicine at [facility], [facility]            Passed - Patient is not pregnant       zolpidem  (AMBIEN ) 5 MG tablet [Pharmacy Med Name: ZOLPIDEM  TARTRATE TABS 5MG ] 90 tablet 1    Sig: TAKE 1 TABLET AT BEDTIME     Not Delegated - Psychiatry:  Anxiolytics/Hypnotics Failed - 03/27/2024  2:51 PM      Failed - This refill cannot be delegated      Failed - Urine Drug Screen completed in last 360 days      Failed - Valid encounter within last 6 months    Recent Outpatient Visits   None     Future Appointments             In 2 weeks Justus Leita DEL, MD [facility] Primary Care & Sports Medicine at [facility], [facility]             omeprazole  (PRILOSEC) 20 MG capsule [Pharmacy Med Name: OMEPRAZOLE  DR CAPS 20MG ] 180 capsule 1    Sig: TAKE 1 CAPSULE TWICE A DAY BEFORE MEALS     Gastroenterology: Proton Pump Inhibitors Failed - 03/27/2024  2:51 PM      Failed - Valid encounter within last 12 months    Recent Outpatient Visits   None     Future Appointments             In 2 weeks Justus Leita DEL, MD [facility] Primary Care & Sports Medicine at [facility], [facility]             gabapentin  (NEURONTIN ) 300 MG capsule [Pharmacy Med Name: GABAPENTIN  CAPS 300MG ] 180 capsule 1    Sig: TAKE 2 CAPSULES AT BEDTIME     Neurology: Anticonvulsants - gabapentin  Failed - 03/27/2024  2:51 PM       Failed - Cr in normal range and within 360 days    Creatinine, Ser  Date Value Ref Range Status  02/02/2024 1.05 (H) 0.57 - 1.00 mg/dL Final         Failed - Valid encounter within last 12 months    Recent Outpatient Visits   None     Future Appointments             In 2 weeks Justus Leita DEL, MD [facility] Primary Care & Sports Medicine at [facility], [facility]            Passed - Completed PHQ-2 or PHQ-9 in the last 360 days

## 2024-03-31 DIAGNOSIS — M5459 Other low back pain: Secondary | ICD-10-CM | POA: Diagnosis not present

## 2024-03-31 DIAGNOSIS — M25552 Pain in left hip: Secondary | ICD-10-CM | POA: Diagnosis not present

## 2024-03-31 DIAGNOSIS — M5416 Radiculopathy, lumbar region: Secondary | ICD-10-CM | POA: Diagnosis not present

## 2024-03-31 DIAGNOSIS — R2689 Other abnormalities of gait and mobility: Secondary | ICD-10-CM | POA: Diagnosis not present

## 2024-04-10 ENCOUNTER — Telehealth: Payer: Self-pay | Admitting: Pharmacist

## 2024-04-10 MED ORDER — ZEPBOUND 10 MG/0.5ML ~~LOC~~ SOAJ: 10.0000 mg | SUBCUTANEOUS | 0 refills | Status: DC

## 2024-04-10 NOTE — Telephone Encounter (Signed)
 Patient called requesting refill. She discussed with her PCP about discouragement in regards to minimal weight loss. After this discussion with her PCP she is adamant that she would like faster titration. She was made aware of the risks with more rapid titration of GLP-1RA, but insists on increasing to 10 mg per week. Prescription sent in.

## 2024-04-14 ENCOUNTER — Ambulatory Visit (INDEPENDENT_AMBULATORY_CARE_PROVIDER_SITE_OTHER): Payer: Self-pay | Admitting: Internal Medicine

## 2024-04-14 ENCOUNTER — Encounter: Payer: Self-pay | Admitting: Internal Medicine

## 2024-04-14 VITALS — BP 120/64 | HR 73 | Ht <= 58 in | Wt 194.0 lb

## 2024-04-14 DIAGNOSIS — R7303 Prediabetes: Secondary | ICD-10-CM

## 2024-04-14 DIAGNOSIS — J4520 Mild intermittent asthma, uncomplicated: Secondary | ICD-10-CM

## 2024-04-14 DIAGNOSIS — M48061 Spinal stenosis, lumbar region without neurogenic claudication: Secondary | ICD-10-CM | POA: Diagnosis not present

## 2024-04-14 DIAGNOSIS — Z1231 Encounter for screening mammogram for malignant neoplasm of breast: Secondary | ICD-10-CM

## 2024-04-14 DIAGNOSIS — I10 Essential (primary) hypertension: Secondary | ICD-10-CM

## 2024-04-14 DIAGNOSIS — K219 Gastro-esophageal reflux disease without esophagitis: Secondary | ICD-10-CM | POA: Diagnosis not present

## 2024-04-14 DIAGNOSIS — E782 Mixed hyperlipidemia: Secondary | ICD-10-CM | POA: Diagnosis not present

## 2024-04-14 DIAGNOSIS — I5022 Chronic systolic (congestive) heart failure: Secondary | ICD-10-CM | POA: Diagnosis not present

## 2024-04-14 DIAGNOSIS — Z0000 Encounter for general adult medical examination without abnormal findings: Secondary | ICD-10-CM | POA: Diagnosis not present

## 2024-04-14 DIAGNOSIS — Z6841 Body Mass Index (BMI) 40.0 and over, adult: Secondary | ICD-10-CM

## 2024-04-14 NOTE — Progress Notes (Signed)
 Date:  04/14/2024   Name:  Katrina Poole   DOB:  July 14, 1946   MRN:  [id number]   Chief Complaint: Annual Exam Katrina Poole is a 78 y.o. female who presents today for her Complete Annual Exam. She feels well. She reports exercising - none. She reports she is sleeping fairly well. Breast complaints - none.  Health Maintenance  Topic Date Due   COVID-19 Vaccine (4 - 2024-25 season) 06/06/2023   Zoster (Shingles) Vaccine (1 of 2) 07/15/2024*   Flu Shot  05/05/2024   Medicare Annual Wellness Visit  10/19/2024   DTaP/Tdap/Td vaccine (2 - Td or Tdap) 06/09/2029   Pneumococcal Vaccine for age over 35  Completed   DEXA scan (bone density measurement)  Completed   Hepatitis C Screening  Completed   Hepatitis B Vaccine  Aged Out   HPV Vaccine  Aged Out   Meningitis B Vaccine  Aged Out   Colon Cancer Screening  Discontinued  *Topic was postponed. The date shown is not the original due date.    Hypertension This is a chronic problem. The problem is controlled. Pertinent negatives include no chest pain, headaches, palpitations or shortness of breath. Past treatments include angiotensin blockers, beta blockers and diuretics. Hypertensive end-organ damage includes kidney disease and CAD/MI.  Gastroesophageal Reflux She complains of heartburn. She reports no abdominal pain, no chest pain, no coughing or no wheezing. This is a recurrent problem. The problem occurs occasionally. Pertinent negatives include no fatigue. She has tried a PPI for the symptoms.  Hyperlipidemia This is a chronic problem. The problem is controlled. Pertinent negatives include no chest pain, myalgias or shortness of breath. Current antihyperlipidemic treatment includes statins (and Repatha ).  Obesity - she is on Zepbound  and has not lost any weight.  Her appetite is decreased but she is discouraged.  Dose was just increased to 10 mg. Spinal stenosis - recent lumbar ablation was done -she has much relief from the  pain. Chronic CHF - on multiple medications; recent ECHO showed marked improvement in EF to 55%  Review of Systems  Constitutional:  Negative for fatigue and unexpected weight change.  HENT:  Negative for trouble swallowing.   Eyes:  Negative for visual disturbance.  Respiratory:  Negative for cough, chest tightness, shortness of breath and wheezing.   Cardiovascular:  Negative for chest pain, palpitations and leg swelling.  Gastrointestinal:  Positive for heartburn. Negative for abdominal pain, constipation and diarrhea.  Genitourinary:  Negative for dysuria, hematuria and urgency.  Musculoskeletal:  Positive for gait problem (mild knee issues). Negative for arthralgias and myalgias.  Allergic/Immunologic: Negative for environmental allergies.  Neurological:  Negative for dizziness, weakness, light-headedness and headaches.  Psychiatric/Behavioral:  Negative for dysphoric mood and sleep disturbance. The patient is not nervous/anxious.      Lab Results  Component Value Date   NA 141 02/02/2024   K 4.9 02/02/2024   CO2 22 02/02/2024   GLUCOSE 76 02/02/2024   BUN 26 02/02/2024   CREATININE 1.05 (H) 02/02/2024   CALCIUM  9.7 02/02/2024   EGFR 55 (L) 02/02/2024   GFRNONAA 52 (L) 04/16/2023   Lab Results  Component Value Date   CHOL 120 10/21/2023   HDL 53 10/21/2023   LDLCALC 50 10/21/2023   TRIG 87 10/21/2023   CHOLHDL 2.3 10/21/2023   Lab Results  Component Value Date   TSH 2.910 07/06/2023   Lab Results  Component Value Date   HGBA1C 5.5 07/06/2023   Lab Results  Component Value Date   WBC 7.9 08/23/2023   HGB 12.2 08/23/2023   HCT 36.9 08/23/2023   MCV 94.1 08/23/2023   PLT 292 08/23/2023   Lab Results  Component Value Date   ALT 14 04/16/2023   AST 17 04/16/2023   ALKPHOS 81 04/16/2023   BILITOT 0.3 04/16/2023   No results found for: MARIEN BOLLS, VD25OH   Patient Active Problem List   Diagnosis Date Noted   Cardiomyopathy (HCC) 02/08/2023    Chronic HFrEF (heart failure with reduced ejection fraction) (HCC) 02/08/2023   CAD (coronary artery disease) 02/08/2023   Allergic pharyngitis 01/14/2023   Left bundle branch block 11/18/2022   DDD (degenerative disc disease), lumbar 10/09/2022   Lumbar spondylosis 09/17/2022   Prediabetes 04/22/2022   Spinal stenosis at L4-L5 level 02/09/2022   Lumbar radiculopathy 07/21/2021   Heart palpitations 07/01/2021   Mixed hyperlipidemia 07/01/2021   Generalized anxiety disorder 07/01/2021   Body mass index (BMI) 40.0-44.9, adult (HCC) 06/12/2021   Essential hypertension 08/16/2020   Meniere's disease 08/16/2020   Restless leg syndrome 08/16/2020   Primary insomnia 08/16/2020   Mild intermittent asthma without complication 08/16/2020   Diverticulosis of colon 08/16/2020   HNP (herniated nucleus pulposus), lumbar 04/29/2020   Gastroesophageal reflux disease without esophagitis 01/24/2020   Spondylosis of cervical spine 03/16/2018   Osteoarthritis of knee 02/06/2018   S/P total hip arthroplasty 11/25/2015    Allergies  Allergen Reactions   Azithromycin Hives and Other (See Comments)   Sulfa Antibiotics Other (See Comments) and Rash    Hallucinations   Oxycodone  Other (See Comments)    Makes head foggy and interferes with Meniere's syndrome.     Fentanyl      Caused my heart problems   Sulfacetamide    Beef-Derived Drug Products Other (See Comments)    Tears up my stomach.    Past Surgical History:  Procedure Laterality Date   ABDOMINAL EXPOSURE N/A 10/09/2022   Procedure: ABDOMINAL EXPOSURE;  Surgeon: Gretta Lonni PARAS, MD;  Location: [facility] OR;  Service: Vascular;  Laterality: N/A;   ANTERIOR CERVICAL DECOMP/DISCECTOMY FUSION N/A 03/16/2018   Procedure: Anterior Cervical Discectomy Fusion - Cervical Three- Cervical Four, Cervical Four- Cervical Five, Cervical Five-Cervical Six, Cervical Six- Cerival Seven;  Surgeon: Onetha Kuba, MD;  Location: [facility] OR;  Service: Neurosurgery;   Laterality: N/A;  Anterior Cervical Discectomy Fusion - Cervical Three- Cervical Four, Cervical Four- Cervical Five, Cervical Five-Cervical Six, Cervical Six- Ceriva   ANTERIOR LUMBAR FUSION N/A 10/09/2022   Procedure: Anteriorr Lumbar Interbody Fusion - Level Five-Sacral One  with posterior cortical screw augmentation and decompressive laminotomy on the right;  Surgeon: Onetha Kuba, MD;  Location: [facility] OR;  Service: Neurosurgery;  Laterality: N/A;   APPENDECTOMY     BREAST SURGERY     Biopsy   BUNIONECTOMY     Left x1, Right x2.   CARDIAC CATHETERIZATION     x2 both normal   CARPAL TUNNEL RELEASE Right    COLONOSCOPY     COLONOSCOPY WITH PROPOFOL  N/A 06/04/2016   Procedure: COLONOSCOPY WITH PROPOFOL ;  Surgeon: Gladis RAYMOND Mariner, MD;  Location: [facility] ENDOSCOPY;  Service: Endoscopy;  Laterality: N/A;   DILATION AND CURETTAGE OF UTERUS     ECTOPIC PREGNANCY SURGERY     ESOPHAGOGASTRODUODENOSCOPY (EGD) WITH PROPOFOL  N/A 06/04/2016   Procedure: ESOPHAGOGASTRODUODENOSCOPY (EGD) WITH PROPOFOL ;  Surgeon: Gladis RAYMOND Mariner, MD;  Location: [facility] ENDOSCOPY;  Service: Endoscopy;  Laterality: N/A;   exploratory laparoscopy     infertility surgery  EYE SURGERY     Catarac removal   FRACTURE SURGERY     HALLUX VALGUS CORRECTION Bilateral    HARDWARE REMOVAL Left 11/05/2023   Procedure: HARDWARE REMOVAL;  Surgeon: Lennie Barter, DPM;  Location: [facility] ORS;  Service: Orthopedics/Podiatry;  Laterality: Left;   JOINT REPLACEMENT Bilateral    partial knee   KNEE ARTHROPLASTY Left 01/27/2021   Procedure: CONVERSION OF LEFT PARTIAL KNEE TO LEFT TOTAL KNEE.;  Surgeon: Mardee Lynwood SQUIBB, MD;  Location: [facility] ORS;  Service: Orthopedics;  Laterality: Left;   KNEE ARTHROSCOPY Left 07/19/2015   Procedure: Arthroscopic limited synovectomy along with lateral compartment and retropatellar chondral debridement plus removal of several small loose bodies;  Surgeon: Helayne Glenn, MD;  Location: [facility];  Service:  Orthopedics;  Laterality: Left;  1ST CASE PER CECE   LAMINECTOMY WITH POSTERIOR LATERAL ARTHRODESIS LEVEL 2 N/A 10/09/2022   Procedure: LAMINECTOMY WITH POSTERIOR LATERAL ARTHRODESIS LEVEL TWO;  Surgeon: Onetha Kuba, MD;  Location: [facility] OR;  Service: Neurosurgery;  Laterality: N/A;   LEFT HEART CATH AND CORONARY ANGIOGRAPHY Left 02/08/2023   Procedure: LEFT HEART CATH AND CORONARY ANGIOGRAPHY;  Surgeon: Darron Deatrice LABOR, MD;  Location: [facility] INVASIVE CV LAB;  Service: Cardiovascular;  Laterality: Left;   LUMBAR LAMINECTOMY/DECOMPRESSION MICRODISCECTOMY Right 04/29/2020   Procedure: Microdiscectomy - Lumbar Three-Lumbar Four - Lumbar Four-Lumbar Five - right;  Surgeon: Onetha Kuba, MD;  Location: [facility] OR;  Service: Neurosurgery;  Laterality: Right;  Microdiscectomy - Lumbar Three-Lumbar Four - Lumbar Four-Lumbar Five - right   MEDIAL PARTIAL KNEE REPLACEMENT Bilateral    POSTERIOR LUMBAR FUSION  02/2022   L3-4 and L4-5   SHOULDER SURGERY Right    SPINE SURGERY     C3-7 fused   TOTAL HIP ARTHROPLASTY Left 11/25/2015   Procedure: TOTAL HIP ARTHROPLASTY;  Surgeon: Lynwood SQUIBB Mardee, MD;  Location: [facility] ORS;  Service: Orthopedics;  Laterality: Left;   TRIGGER FINGER RELEASE Left     Social History   Tobacco Use   Smoking status: Former    Current packs/day: 0.00    Average packs/day: 1.5 packs/day for 30.0 years (45.0 ttl pk-yrs)    Types: Cigarettes    Start date: 11/12/1960    Quit date: 11/12/1990    Years since quitting: 33.4   Smokeless tobacco: Never  Vaping Use   Vaping status: Never Used  Substance Use Topics   Alcohol use: Yes    Alcohol/week: 1.0 standard drink of alcohol    Types: 1 Standard drinks or equivalent per week    Comment: OCC [place]ANDY, maybe 1 drink a week   Drug use: No     Medication list has been reviewed and updated.  Current Meds  Medication Sig   acetaminophen  (TYLENOL ) 500 MG tablet Take 2 tablets (1,000 mg total) by mouth every 6 (six) hours as needed  (DO NOT EXCEED 4000 MG DAILY FROM ALL SOURCES). (Patient taking differently: Take 1,000 mg by mouth daily.)   albuterol  (VENTOLIN  HFA) 108 (90 Base) MCG/ACT inhaler Inhale 2 puffs into the lungs every 6 (six) hours as needed for wheezing or shortness of breath.   alum hydroxide-mag trisilicate (GAVISCON ) 80-20 MG CHEW chewable tablet Chew 1 tablet by mouth daily as needed for indigestion or heartburn.   budesonide -formoterol  (SYMBICORT ) 160-4.5 MCG/ACT inhaler Inhale 2 puffs into the lungs in the morning and at bedtime.   carvedilol  (COREG ) 12.5 MG tablet Take 1.5 tablets (18.75 mg total) by mouth 2 (two) times daily.   cephALEXin  (  KEFLEX ) 500 MG capsule TAKE 4 PRIOR TO DENTAL PROCEDURES   clonazePAM  (KLONOPIN ) 0.5 MG tablet TAKE 1 TABLET AT BEDTIME AS NEEDED FOR ANXIETY   docusate sodium  (COLACE) 100 MG capsule Take 100 mg by mouth every other day.   empagliflozin  (JARDIANCE ) 10 MG TABS tablet Take 1 tablet (10 mg total) by mouth daily before breakfast.   Evolocumab  (REPATHA  SURECLICK) 140 MG/ML SOAJ Inject 140 mg into the skin every 14 (fourteen) days.   furosemide  (LASIX ) 20 MG tablet Take 1 tablet (20 mg total) by mouth daily as needed (swelling, shortness of breath, gain 3 lbs overnight or 5 lbs in a week).   gabapentin  (NEURONTIN ) 300 MG capsule TAKE 2 CAPSULES AT BEDTIME   meclizine  (ANTIVERT ) 12.5 MG tablet Take 12.5 mg by mouth 3 (three) times daily as needed for dizziness. Dr. Salli   Multiple Vitamin (MULTIVITAMIN) tablet Take 1 tablet by mouth every morning.   omeprazole  (PRILOSEC) 20 MG capsule TAKE 1 CAPSULE TWICE A DAY BEFORE MEALS   potassium chloride  (KLOR-CON  M) 10 MEQ tablet Take 1 tablet (10 mEq total) by mouth as needed (with lasix ).   Probiotic Product (PROBIOTIC DAILY PO) Take 1 capsule by mouth daily.   sacubitril -valsartan  (ENTRESTO ) 97-103 MG Take 1 tablet by mouth 2 (two) times daily.   Spacer/Aero-Holding Chambers (AEROCHAMBER MV) inhaler Use as instructed    spironolactone  (ALDACTONE ) 25 MG tablet Take 1 tablet (25 mg total) by mouth daily.   tirzepatide  (ZEPBOUND ) 10 MG/0.5ML Pen Inject 10 mg into the skin once a week.   traMADol  (ULTRAM ) 50 MG tablet Take 50 mg by mouth daily.   VITAMIN D  PO Take 5,000 Units by mouth daily.   zolpidem  (AMBIEN ) 5 MG tablet TAKE 1 TABLET AT BEDTIME       04/14/2024    8:13 AM 10/12/2023    9:18 AM 04/13/2023    8:13 AM 11/18/2022    8:58 AM  GAD 7 : Generalized Anxiety Score  Nervous, Anxious, on Edge 0 0 0 0  Control/stop worrying 0 0 0 0  Worry too much - different things 0 0 1 0  Trouble relaxing 0 0 1 0  Restless 1 0 1 0  Easily annoyed or irritable 0 0 1 0  Afraid - awful might happen 0 0 0 0  Total GAD 7 Score 1 0 4 0  Anxiety Difficulty Not difficult at all Not difficult at all Not difficult at all Not difficult at all       04/14/2024    8:13 AM 10/20/2023    8:06 AM 10/12/2023    9:17 AM  Depression screen PHQ 2/9  Decreased Interest 0 0 0  Down, Depressed, Hopeless 0 0 0  PHQ - 2 Score 0 0 0  Altered sleeping 1 0 0  Tired, decreased energy 1 0 0  Change in appetite 0 0 0  Feeling bad or failure about yourself  0 0 0  Trouble concentrating 0 0 0  Moving slowly or fidgety/restless 0 0 0  Suicidal thoughts 0 0 0  PHQ-9 Score 2 0 0  Difficult doing work/chores Not difficult at all Not difficult at all Not difficult at all    BP Readings from Last 3 Encounters:  04/14/24 120/64  02/02/24 122/66  01/31/24 120/82    Physical Exam Vitals and nursing note reviewed.  Constitutional:      General: She is not in acute distress.    Appearance: Normal appearance. She is well-developed.  HENT:     Head: Normocephalic and atraumatic.     Right Ear: Tympanic membrane and ear canal normal.     Left Ear: Tympanic membrane and ear canal normal.     Nose:     Right Sinus: No maxillary sinus tenderness.     Left Sinus: No maxillary sinus tenderness.  Eyes:     General: No scleral icterus.        Right eye: No discharge.        Left eye: No discharge.     Conjunctiva/sclera: Conjunctivae normal.  Neck:     Thyroid : No thyromegaly.     Vascular: No carotid bruit.  Cardiovascular:     Rate and Rhythm: Normal rate and regular rhythm.     Pulses: Normal pulses.     Heart sounds: Normal heart sounds.  Pulmonary:     Effort: Pulmonary effort is normal. No respiratory distress.     Breath sounds: No wheezing.  Abdominal:     General: Bowel sounds are normal.     Palpations: Abdomen is soft.     Tenderness: There is no abdominal tenderness.  Musculoskeletal:        General: No swelling.     Cervical back: Normal range of motion. No erythema.     Right lower leg: No edema.     Left lower leg: No edema.  Lymphadenopathy:     Cervical: No cervical adenopathy.  Skin:    General: Skin is warm and dry.     Capillary Refill: Capillary refill takes less than 2 seconds.     Findings: No rash.  Neurological:     General: No focal deficit present.     Mental Status: She is alert and oriented to person, place, and time.     Cranial Nerves: No cranial nerve deficit.     Sensory: No sensory deficit.     Deep Tendon Reflexes: Reflexes are normal and symmetric.  Psychiatric:        Attention and Perception: Attention normal.        Mood and Affect: Mood normal.     Wt Readings from Last 3 Encounters:  04/14/24 194 lb (88 kg)  02/02/24 198 lb 8 oz (90 kg)  01/31/24 193 lb 4.8 oz (87.7 kg)    BP 120/64   Pulse 73   Ht 4' 10 (1.473 m)   Wt 194 lb (88 kg)   SpO2 97%   BMI 40.55 kg/m   Assessment and Plan:  Problem List Items Addressed This Visit       Unprioritized   Gastroesophageal reflux disease without esophagitis (Chronic)   Reflux symptoms are controlled on omeprazole . Patient denies red flag symptoms - no melena, weight loss, dysphagia.       Relevant Orders   CBC with Differential/Platelet   Essential hypertension (Chronic)   BP improved with increase in  Coreg  to 18.75 mg daily. She continues on Enstresto and spironolactone       Relevant Orders   CBC with Differential/Platelet   TSH   Mild intermittent asthma without complication (Chronic)   Symptoms stable on Symbicort  and PRN albuterol . No recent flares or URIs      Mixed hyperlipidemia (Chronic)   LDL is  Lab Results  Component Value Date   LDLCALC 50 10/21/2023   Current regimen is Repatha .  No medication side effects noted. Goal LDL is <55.       Chronic HFrEF (heart failure with reduced ejection fraction) (HCC) (  Chronic)   On multiple medications and followed by Cardiology. Feels well, working on weight loss with Zepbound       Relevant Orders   Comprehensive metabolic panel with GFR   Body mass index (BMI) 40.0-44.9, adult (HCC)   Was on ozempic without weight loss Cardiology changed her to Zepbound  in April. On 5 mg and increasing to 10 mg but NO weight loss I encourage her to continue up titration of the dose to maximum      Spinal stenosis at L4-L5 level   Ablation in June - left lumbar with excellent results.      Prediabetes   Lab Results  Component Value Date   HGBA1C 5.5 07/06/2023  She is on Jardiance  and Zepbound  for cardiac reasons so expect A1c to remain controlled.      Relevant Orders   Hemoglobin A1c   Other Visit Diagnoses       Annual physical exam    -  Primary   up to date on screenings and immunizations     Encounter for screening mammogram for breast cancer       Done in February at [facility]       Return in about 6 months (around 10/15/2024) for HTN Dr. Lemon.    Leita HILARIO Adie, MD [facility] [place]

## 2024-04-14 NOTE — Assessment & Plan Note (Addendum)
 Was on ozempic without weight loss Cardiology changed her to Zepbound  in April. On 5 mg and increasing to 10 mg but NO weight loss I encourage her to continue up titration of the dose to maximum

## 2024-04-14 NOTE — Assessment & Plan Note (Signed)
 Lab Results  Component Value Date   HGBA1C 5.5 07/06/2023  She is on Jardiance  and Zepbound  for cardiac reasons so expect A1c to remain controlled.

## 2024-04-14 NOTE — Assessment & Plan Note (Addendum)
 BP improved with increase in Coreg  to 18.75 mg daily. She continues on Enstresto and spironolactone 

## 2024-04-14 NOTE — Assessment & Plan Note (Signed)
 Ablation in June - left lumbar with excellent results.

## 2024-04-14 NOTE — Assessment & Plan Note (Signed)
 Symptoms stable on Symbicort  and PRN albuterol . No recent flares or URIs

## 2024-04-14 NOTE — Assessment & Plan Note (Signed)
 Reflux symptoms are controlled on omeprazole. Patient denies red flag symptoms - no melena, weight loss, dysphagia.

## 2024-04-14 NOTE — Assessment & Plan Note (Signed)
 LDL is  Lab Results  Component Value Date   LDLCALC 50 10/21/2023   Current regimen is Repatha .  No medication side effects noted. Goal LDL is <55.

## 2024-04-14 NOTE — Assessment & Plan Note (Signed)
 On multiple medications and followed by Cardiology. Feels well, working on weight loss with Zepbound 

## 2024-05-02 DIAGNOSIS — M47816 Spondylosis without myelopathy or radiculopathy, lumbar region: Secondary | ICD-10-CM | POA: Diagnosis not present

## 2024-05-05 DIAGNOSIS — K219 Gastro-esophageal reflux disease without esophagitis: Secondary | ICD-10-CM | POA: Diagnosis not present

## 2024-05-05 DIAGNOSIS — I10 Essential (primary) hypertension: Secondary | ICD-10-CM | POA: Diagnosis not present

## 2024-05-05 DIAGNOSIS — I5022 Chronic systolic (congestive) heart failure: Secondary | ICD-10-CM | POA: Diagnosis not present

## 2024-05-05 DIAGNOSIS — R7303 Prediabetes: Secondary | ICD-10-CM | POA: Diagnosis not present

## 2024-05-06 ENCOUNTER — Ambulatory Visit: Payer: Self-pay | Admitting: Internal Medicine

## 2024-05-06 LAB — COMPREHENSIVE METABOLIC PANEL WITH GFR
ALT: 15 IU/L (ref 0–32)
AST: 17 IU/L (ref 0–40)
Albumin: 4.3 g/dL (ref 3.8–4.8)
Alkaline Phosphatase: 87 IU/L (ref 44–121)
BUN/Creatinine Ratio: 25 (ref 12–28)
BUN: 26 mg/dL (ref 8–27)
Bilirubin Total: 0.4 mg/dL (ref 0.0–1.2)
CO2: 20 mmol/L (ref 20–29)
Calcium: 9.5 mg/dL (ref 8.7–10.3)
Chloride: 102 mmol/L (ref 96–106)
Creatinine, Ser: 1.04 mg/dL — ABNORMAL HIGH (ref 0.57–1.00)
Globulin, Total: 2 g/dL (ref 1.5–4.5)
Glucose: 95 mg/dL (ref 70–99)
Potassium: 4.9 mmol/L (ref 3.5–5.2)
Sodium: 137 mmol/L (ref 134–144)
Total Protein: 6.3 g/dL (ref 6.0–8.5)
eGFR: 56 mL/min/1.73 — ABNORMAL LOW (ref >59)

## 2024-05-06 LAB — TSH: TSH: 3.05 u[IU]/mL (ref 0.450–4.500)

## 2024-05-06 LAB — CBC WITH DIFFERENTIAL/PLATELET
Basophils Absolute: 0.1 x10E3/uL (ref 0.0–0.2)
Basos: 1 %
EOS (ABSOLUTE): 0.1 x10E3/uL (ref 0.0–0.4)
Eos: 2 %
Hematocrit: 36.5 % (ref 34.0–46.6)
Hemoglobin: 11.9 g/dL (ref 11.1–15.9)
Immature Grans (Abs): 0 x10E3/uL (ref 0.0–0.1)
Immature Granulocytes: 0 %
Lymphocytes Absolute: 1.1 x10E3/uL (ref 0.7–3.1)
Lymphs: 21 %
MCH: 31.9 pg (ref 26.6–33.0)
MCHC: 32.6 g/dL (ref 31.5–35.7)
MCV: 98 fL — ABNORMAL HIGH (ref 79–97)
Monocytes Absolute: 0.5 x10E3/uL (ref 0.1–0.9)
Monocytes: 9 %
Neutrophils Absolute: 3.6 x10E3/uL (ref 1.4–7.0)
Neutrophils: 67 %
Platelets: 294 x10E3/uL (ref 150–450)
RBC: 3.73 x10E6/uL — ABNORMAL LOW (ref 3.77–5.28)
RDW: 13.5 % (ref 11.7–15.4)
WBC: 5.3 x10E3/uL (ref 3.4–10.8)

## 2024-05-06 LAB — HEMOGLOBIN A1C
Est. average glucose Bld gHb Est-mCnc: 105 mg/dL
Hgb A1c MFr Bld: 5.3 % (ref 4.8–5.6)

## 2024-05-07 ENCOUNTER — Other Ambulatory Visit: Payer: Self-pay | Admitting: Internal Medicine

## 2024-05-07 DIAGNOSIS — G2581 Restless legs syndrome: Secondary | ICD-10-CM

## 2024-05-08 ENCOUNTER — Telehealth: Payer: Self-pay | Admitting: Pharmacist

## 2024-05-08 MED ORDER — ZEPBOUND 12.5 MG/0.5ML ~~LOC~~ SOAJ: 12.5000 mg | SUBCUTANEOUS | 0 refills | Status: DC

## 2024-05-08 NOTE — Telephone Encounter (Signed)
 Patient tolerating Mounjaro 10 mg weekly. Increase to 12.5 mg weekly.

## 2024-05-09 NOTE — Telephone Encounter (Signed)
 Dr. Justus is currently out of the office until 05/15/24, could you review this medication refill request?

## 2024-05-09 NOTE — Telephone Encounter (Signed)
 Requested medication (s) are due for refill today: yes  Requested medication (s) are on the active medication list: yes  Last refill:  03/27/24 #30  Future visit scheduled: yes  Notes to clinic:  med not delegated to NT to RF   Requested Prescriptions  Pending Prescriptions Disp Refills   clonazePAM  (KLONOPIN ) 0.5 MG tablet [Pharmacy Med Name: CLONAZEPAM  TABS 0.5MG ] 30 tablet 0    Sig: TAKE 1 TABLET AT BEDTIME AS NEEDED FOR ANXIETY     Not Delegated - Psychiatry: Anxiolytics/Hypnotics 2 Failed - 05/09/2024 10:09 AM      Failed - This refill cannot be delegated      Failed - Urine Drug Screen completed in last 360 days      Passed - Patient is not pregnant      Passed - Valid encounter within last 6 months    Recent Outpatient Visits           3 weeks ago Annual physical exam   [facility] at [facility], Leita DEL, MD

## 2024-05-30 DIAGNOSIS — M25562 Pain in left knee: Secondary | ICD-10-CM | POA: Diagnosis not present

## 2024-05-30 DIAGNOSIS — Z96651 Presence of right artificial knee joint: Secondary | ICD-10-CM | POA: Diagnosis not present

## 2024-05-30 DIAGNOSIS — M25561 Pain in right knee: Secondary | ICD-10-CM | POA: Diagnosis not present

## 2024-05-30 DIAGNOSIS — Z96652 Presence of left artificial knee joint: Secondary | ICD-10-CM | POA: Diagnosis not present

## 2024-06-14 ENCOUNTER — Other Ambulatory Visit: Payer: Self-pay | Admitting: Student

## 2024-06-14 ENCOUNTER — Telehealth: Payer: Self-pay | Admitting: Pharmacist

## 2024-06-14 DIAGNOSIS — G2581 Restless legs syndrome: Secondary | ICD-10-CM

## 2024-06-14 NOTE — Telephone Encounter (Signed)
 Patient called to inform the clinic that her [employer] plan will no longer cover Zepbound . She inquired about Saxenda and was informed that the weight loss for Katrina Poole is half that of Wegovy  and Zepbound , both of which she has had no weight loss at high doses. She will discuss this at her next appointment.

## 2024-06-15 NOTE — Telephone Encounter (Signed)
 Requested medication (s) are due for refill today: Yes  Requested medication (s) are on the active medication list: Yes  Last refill:  05/09/24  Future visit scheduled: Yes  Notes to clinic:  Not delegated    Requested Prescriptions  Pending Prescriptions Disp Refills   clonazePAM  (KLONOPIN ) 0.5 MG tablet [Pharmacy Med Name: CLONAZEPAM  TABS 0.5MG ] 30 tablet 0    Sig: TAKE 1 TABLET AT BEDTIME AS NEEDED FOR ANXIETY     Not Delegated - Psychiatry: Anxiolytics/Hypnotics 2 Failed - 06/15/2024  2:10 PM      Failed - This refill cannot be delegated      Failed - Urine Drug Screen completed in last 360 days      Passed - Patient is not pregnant      Passed - Valid encounter within last 6 months    Recent Outpatient Visits           2 months ago Annual physical exam   [facility] at [facility], Leita DEL, MD

## 2024-06-15 NOTE — Telephone Encounter (Signed)
 Please review.  KP

## 2024-06-21 ENCOUNTER — Telehealth: Payer: Self-pay | Admitting: Family

## 2024-06-21 NOTE — Telephone Encounter (Signed)
 Called to confirm/remind patient of their appointment [facility] on 06/22/24.   Appointment:   [x] Confirmed  [] Left mess   [] No answer/No voice mail  [] VM Full/unable to leave message  [] Phone not in service  Patient reminded to bring all medications and/or complete list.  Confirmed patient has transportation. Gave directions, instructed to utilize valet parking.

## 2024-06-22 ENCOUNTER — Encounter: Payer: Self-pay | Admitting: Cardiology

## 2024-06-22 ENCOUNTER — Ambulatory Visit: Attending: Cardiology | Admitting: Cardiology

## 2024-06-22 ENCOUNTER — Other Ambulatory Visit (HOSPITAL_COMMUNITY): Payer: Self-pay

## 2024-06-22 VITALS — BP 129/61 | HR 70 | Wt 199.0 lb

## 2024-06-22 DIAGNOSIS — I5022 Chronic systolic (congestive) heart failure: Secondary | ICD-10-CM

## 2024-06-22 DIAGNOSIS — I2510 Atherosclerotic heart disease of native coronary artery without angina pectoris: Secondary | ICD-10-CM | POA: Diagnosis not present

## 2024-06-22 DIAGNOSIS — Z87891 Personal history of nicotine dependence: Secondary | ICD-10-CM | POA: Insufficient documentation

## 2024-06-22 DIAGNOSIS — Z79899 Other long term (current) drug therapy: Secondary | ICD-10-CM | POA: Diagnosis not present

## 2024-06-22 DIAGNOSIS — I110 Hypertensive heart disease with heart failure: Secondary | ICD-10-CM | POA: Diagnosis not present

## 2024-06-22 DIAGNOSIS — E669 Obesity, unspecified: Secondary | ICD-10-CM | POA: Insufficient documentation

## 2024-06-22 DIAGNOSIS — I5032 Chronic diastolic (congestive) heart failure: Secondary | ICD-10-CM | POA: Insufficient documentation

## 2024-06-22 DIAGNOSIS — J45909 Unspecified asthma, uncomplicated: Secondary | ICD-10-CM | POA: Insufficient documentation

## 2024-06-22 DIAGNOSIS — Z7984 Long term (current) use of oral hypoglycemic drugs: Secondary | ICD-10-CM | POA: Diagnosis not present

## 2024-06-22 DIAGNOSIS — I428 Other cardiomyopathies: Secondary | ICD-10-CM | POA: Diagnosis not present

## 2024-06-22 DIAGNOSIS — Z79620 Long term (current) use of immunosuppressive biologic: Secondary | ICD-10-CM | POA: Insufficient documentation

## 2024-06-22 MED ORDER — MOUNJARO 12.5 MG/0.5ML ~~LOC~~ SOAJ: 12.5000 mg | SUBCUTANEOUS | 0 refills | Status: DC

## 2024-06-22 NOTE — Progress Notes (Addendum)
 PCP: Justus Leita DEL, MD Cardiology: Dr. Darliss HF Cardiology: Dr. Rolan  Chief complaint: CHF  78 y.o.with history of HTN and osteoarthritis was found to have a cardiomyopathy in 4/24.  Patient reports 2 prior cardiac caths several years ago, one at [facility] and one at [facility].  Neither showed obstructive CAD.  She does have a strong family history of CAD (multiple siblings with MIs/CAD).  She never had echoes with prior workups per her report.  Patient was found to have a LBBB, so echo was ordered by Dr. Darliss.  Echo was done in 4/24 and showed EF 25-30%, moderate LV dilation, normal RV, and moderate MR.  Due to low EF, she was set up for LHC that was done in 5/24.  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  Patient was short of breath and was put on Bipap.  She was markedly hypertensive.  She was admitted overnight and diuresed.   Cardiac MRI in 7/24 showed normal LV size with EF 38%, septal-lateral dyssynchrony, RV EF 56%, no LGE, ECV 28%.   Echo in 5/25 showed EF up to 55% with normal RV.    Patient returns for followup of CHF. She is on tirzepatide , weight is unchanged.  She walks for exercise.  Some limitation from knee and back pain.  Frustrated that she is not losing weight.  She has occasional shortness of breath/wheezing from asthma.  Went on a cruise to [place]  recently, did great.  She continues to spend a lot of time quilting.  No chest pain.  No dyspnea with usual activities.  No orthopnea/PND.  No palpitations.     Labs (5/24): K 4.2, creatinine 1.2, LDL 107 Labs (6/24): BNP 75, K 4.8, creatinine 1.31 Labs (7/24): K 4.6, creatinine 1.0 Labs (10/24): BNP 87, creatinine 0.81 Labs (1/25): LDL 50, BNP 22, K 4.9, creatinine 1.06 Labs (8/25): TSH normal, K 4.9, creatinine 1.04, hgh 11.9  PMH: 1. LBBB 2. CAD: LHC ([facility] in 2013) with 50% LAD stenosis.  - LHC (5/24): 30% pLAD, 40% mLAD.  3. Chronic systolic CHF: Nonischemic  cardiomyopathy.  - Echo (4/24): EF 25-30%, normal RV, moderate MR.  - Cardiac MRI (7/24): normal LV size with EF 38%, septal-lateral dyssynchrony, RV EF 56%, no LGE, ECV 28%.  - Echo (5/25): EF 55%, normal RV.  4. HTN 5. Meniere's disease 6. Lumbar spinal fusion 1/24 7. OA 8. Asthma 9. Hyperlipidemia: Leg weakness with statins.  10. Low back pain.   Social History   Socioeconomic History   Marital status: Married    Spouse name: Francis Coventry   Number of children: 1   Years of education: Not on file   Highest education level: Not on file  Occupational History   Occupation: Part time  Tobacco Use   Smoking status: Former    Current packs/day: 0.00    Average packs/day: 1.5 packs/day for 30.0 years (45.0 ttl pk-yrs)    Types: Cigarettes    Start date: 11/12/1960    Quit date: 11/12/1990    Years since quitting: 33.6   Smokeless tobacco: Never  Vaping Use   Vaping status: Never Used  Substance and Sexual Activity   Alcohol use: Yes    Alcohol/week: 1.0 standard drink of alcohol    Types: 1 Standard drinks or equivalent per week    Comment: [facility] [place], maybe 1 drink a week   Drug use: No   Sexual activity: Not Currently  Other  Topics Concern   Not on file  Social History Narrative   Not on file   Social Drivers of Health   Financial Resource Strain: Low Risk  (12/21/2023)   Received from [facility]   Overall Financial Resource Strain (CARDIA)    Difficulty of Paying Living Expenses: Not hard at all  Food Insecurity: No Food Insecurity (12/21/2023)   Received from [facility]   Hunger Vital Sign    Within the past 12 months, you worried that your food would run out before you got the money to buy more.: Never true    Within the past 12 months, the food you bought just didn't last and you didn't have money to get more.: Never true  Transportation Needs: No Transportation Needs (12/21/2023)   Received from [facility] - Transportation    In the past 12 months, has lack of transportation kept you from medical appointments or from getting medications?: No    Lack of Transportation (Non-Medical): No  Physical Activity: Insufficiently Active (10/20/2023)   Exercise Vital Sign    Days of Exercise per Week: 5 days    Minutes of Exercise per Session: 20 min  Stress: No Stress Concern Present (10/20/2023)   [employer] of [employer]ational [employer] - Occupational Stress Questionnaire    Feeling of Stress : Not at all  Social Connections: Moderately Integrated (10/20/2023)   Social Connection and Isolation Panel    Frequency of Communication with Friends and Family: More than three times a week    Frequency of Social Gatherings with Friends and Family: Once a week    Attends Religious Services: Never    Database administrator or Organizations: Yes    Attends Engineer, structural: More than 4 times per year    Marital Status: Married  Catering manager Violence: Not At Risk (10/20/2023)   Humiliation, Afraid, Rape, and Kick questionnaire    Fear of Current or Ex-Partner: No    Emotionally Abused: No    Physically Abused: No    Sexually Abused: No   FH: Brother died at 17 of MI, brother died at 27 of MI, brother died in 51s of CVA, mother with CHF.   ROS: All systems reviewed and negative except as per HPI.   Current Outpatient Medications  Medication Sig Dispense Refill   acetaminophen  (TYLENOL ) 500 MG tablet Take 2 tablets (1,000 mg total) by mouth every 6 (six) hours as needed (DO NOT EXCEED 4000 MG DAILY FROM ALL SOURCES). (Patient taking differently: Take 1,000 mg by mouth daily.) 30 tablet 2   albuterol  (VENTOLIN  HFA) 108 (90 Base) MCG/ACT inhaler Inhale 2 puffs into the lungs every 6 (six) hours as needed for wheezing or shortness of breath. 18 g 1   alum hydroxide-mag trisilicate (GAVISCON ) 80-20 MG CHEW chewable tablet Chew 1 tablet by mouth daily as needed for  indigestion or heartburn.     budesonide -formoterol  (SYMBICORT ) 160-4.5 MCG/ACT inhaler Inhale 2 puffs into the lungs in the morning and at bedtime. 1 each 12   carvedilol  (COREG ) 12.5 MG tablet Take 1.5 tablets (18.75 mg total) by mouth 2 (two) times daily. 270 tablet 3   clonazePAM  (KLONOPIN ) 0.5 MG tablet TAKE 1 TABLET AT BEDTIME AS NEEDED FOR ANXIETY 30 tablet 3   docusate sodium  (COLACE) 100 MG capsule Take 100 mg by mouth every other day. (Patient taking differently: Take 100 mg by mouth daily as needed.)  empagliflozin  (JARDIANCE ) 10 MG TABS tablet Take 1 tablet (10 mg total) by mouth daily before breakfast. 30 tablet 11   Evolocumab  (REPATHA  SURECLICK) 140 MG/ML SOAJ Inject 140 mg into the skin every 14 (fourteen) days. 2 mL 3   furosemide  (LASIX ) 20 MG tablet Take 1 tablet (20 mg total) by mouth daily as needed (swelling, shortness of breath, gain 3 lbs overnight or 5 lbs in a week). 30 tablet 4   gabapentin  (NEURONTIN ) 300 MG capsule TAKE 2 CAPSULES AT BEDTIME 180 capsule 1   meclizine  (ANTIVERT ) 12.5 MG tablet Take 12.5 mg by mouth 3 (three) times daily as needed for dizziness. Dr. Salli     Multiple Vitamin (MULTIVITAMIN) tablet Take 1 tablet by mouth every morning.     omeprazole  (PRILOSEC) 20 MG capsule TAKE 1 CAPSULE TWICE A DAY BEFORE MEALS 180 capsule 1   potassium chloride  (KLOR-CON  M) 10 MEQ tablet Take 1 tablet (10 mEq total) by mouth as needed (with lasix ). 30 tablet 5   Probiotic Product (PROBIOTIC DAILY PO) Take 1 capsule by mouth daily.     sacubitril -valsartan  (ENTRESTO ) 97-103 MG Take 1 tablet by mouth 2 (two) times daily. 180 tablet 3   Spacer/Aero-Holding Chambers (AEROCHAMBER MV) inhaler Use as instructed 1 each 0   spironolactone  (ALDACTONE ) 25 MG tablet Take 1 tablet (25 mg total) by mouth daily. 90 tablet 3   tirzepatide  (MOUNJARO ) 12.5 MG/0.5ML Pen Inject 12.5 mg into the skin once a week. 2 mL 0   traMADol  (ULTRAM ) 50 MG tablet Take 50 mg by mouth daily.      VITAMIN D  PO Take 5,000 Units by mouth daily.     zolpidem  (AMBIEN ) 5 MG tablet TAKE 1 TABLET AT BEDTIME 90 tablet 0   cephALEXin  (KEFLEX ) 500 MG capsule TAKE 4 PRIOR TO DENTAL PROCEDURES (Patient not taking: Reported on 06/22/2024) 4 capsule 1   No current facility-administered medications for this visit.   BP 129/61   Pulse 70   Wt 199 lb (90.3 kg)   SpO2 99%   BMI 41.59 kg/m  General: NAD Neck: No JVD, no thyromegaly or thyroid  nodule.  Lungs: Clear to auscultation bilaterally with normal respiratory effort. CV: Nondisplaced PMI.  Heart regular S1/S2, no S3/S4, no murmur.  No peripheral edema.  No carotid bruit.  Normal pedal pulses.  Abdomen: Soft, nontender, no hepatosplenomegaly, no distention.  Skin: Intact without lesions or rashes.  Neurologic: Alert and oriented x 3.  Psych: Normal affect. Extremities: No clubbing or cyanosis.  HEENT: Normal.   Assessment/Plan: 1. Chronic HF with improved EF: Nonischemic cardiomyopathy.  No heavy ETOH/drugs.  No FH of dilated cardiomyopathy (has FH of CAD).  Patient was found to have LBBB, leading to echo in 4/24 showing EF 25-30%, moderate LV dilation, normal RV, and moderate MR.  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  Cardiac MRI in 7/24 showed normal LV size with EF 38%, septal-lateral dyssynchrony, RV EF 56%, no delayed enhancement, ECV 28%. Echo in 5/25 showed EF up to 55% with normal RV, significant improvement.  NYHA class I-II, not volume overloaded on exam.  - Keep Lasix  prn.  - Continue Entresto  97/103 bid.  - Continue spironolactone  25 mg daily. BMET today.  - Continue Farxiga  10 mg daily.  - Continue Coreg  18.75 mg bid. - Repeat echo at one year in 5/26.  2. CAD: Nonobstructive.  Strong FH of MI. Leg weakness with statins, recurrent with Crestor .  -  Continue Repatha , check lipids today.   3. HTN: BP controlled.   4. Asthma: Followed by pulmonary.  5. Obesity: Continue tirzepatide .     - She has not had much weight loss, I will [facility].    Followup 6 months.   I spent 31 minutes reviewing records, interviewing/examining patient, and managing orders.    Ezra Shuck 06/22/2024

## 2024-06-23 LAB — BASIC METABOLIC PANEL WITH GFR
BUN/Creatinine Ratio: 33 — ABNORMAL HIGH (ref 12–28)
BUN: 38 mg/dL — ABNORMAL HIGH (ref 8–27)
CO2: 23 mmol/L (ref 20–29)
Calcium: 9.4 mg/dL (ref 8.7–10.3)
Chloride: 101 mmol/L (ref 96–106)
Creatinine, Ser: 1.15 mg/dL — ABNORMAL HIGH (ref 0.57–1.00)
Glucose: 70 mg/dL (ref 70–99)
Potassium: 5.3 mmol/L — ABNORMAL HIGH (ref 3.5–5.2)
Sodium: 137 mmol/L (ref 134–144)
eGFR: 49 mL/min/1.73 — ABNORMAL LOW (ref >59)

## 2024-06-23 LAB — BRAIN NATRIURETIC PEPTIDE: BNP: 27.4 pg/mL (ref 0.0–100.0)

## 2024-06-25 ENCOUNTER — Ambulatory Visit (HOSPITAL_COMMUNITY): Payer: Self-pay | Admitting: Cardiology

## 2024-07-04 ENCOUNTER — Ambulatory Visit: Attending: Cardiology | Admitting: Pharmacist

## 2024-07-04 DIAGNOSIS — I2510 Atherosclerotic heart disease of native coronary artery without angina pectoris: Secondary | ICD-10-CM

## 2024-07-04 DIAGNOSIS — Z6841 Body Mass Index (BMI) 40.0 and over, adult: Secondary | ICD-10-CM

## 2024-07-04 DIAGNOSIS — I10 Essential (primary) hypertension: Secondary | ICD-10-CM

## 2024-07-04 NOTE — Progress Notes (Signed)
 Patient ID: Katrina Poole                 DOB: 07-Aug-1946                    MRN: [id number]     HPI: Katrina Poole is a 78 y.o. female patient referred to pharmacy clinic by Dr. Rolan to initiate GLP1-RA therapy. PMH is significant for HFrEF, non-obstructive CAD, HTN, Meniere's diseaseand obesity. Most recent BMI 40 kg/m.  Patient has been on Zepbound  (now Mounjaro ) with disappointing weight loss.  She previously was on Wegovy .  Patient presents today to clinic. She is frustrated with her lack of weight loss with tirzepitide. She reports walking 4000 steps per day, but does not do an intentional walking. She does do 15 squats every morning and core exercises. She gets up every hour or so to walk around.  Use to walk at the outlets but spent too much money. Doesn't thinks she is a gym person. Has back pain/knee pain.   Diet:  Breakfast: protein bar or kashi cereal Lunch: pack of crackers, yogurt and blueberries, or cereal Dinner: meat and vegetable Drink: 2 cups of coffee (sugar free creamer), water  Exercise: 4,000 steps daily, pelvic tilts, stretches  Family History:   Social History: ETOH 4 times per month, no tobacco  Labs: Lab Results  Component Value Date   HGBA1C 5.3 05/05/2024    Wt Readings from Last 1 Encounters:  06/22/24 199 lb (90.3 kg)    BP Readings from Last 1 Encounters:  06/22/24 129/61   Pulse Readings from Last 1 Encounters:  06/22/24 70       Component Value Date/Time   CHOL 120 10/21/2023 0859   TRIG 87 10/21/2023 0859   HDL 53 10/21/2023 0859   CHOLHDL 2.3 10/21/2023 0859   CHOLHDL 3.0 02/09/2023 0507   VLDL 16 02/09/2023 0507   LDLCALC 50 10/21/2023 0859    Past Medical History:  Diagnosis Date   Acute urinary retention 02/08/2023   Allergy    Aortic atherosclerosis    Arthritis    osteo of left hip   Asthma    humidity and exercise during summer   Combined systolic and diastolic heart failure (HCC)    Complication of  anesthesia    Pt reports fentanyl  caused her current heart problems   Flash pulmonary edema (HCC) 02/08/2023   GERD (gastroesophageal reflux disease)    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   Hypertension    controlled on meds   Lumbago of lumbar region with sciatica 06/12/2021   Meniere syndrome    hearing deaf right ear   Neuromuscular disorder (HCC)    finger tips go numb, neck vertebrae   Restless leg syndrome    Spondylolisthesis, lumbosacral region    Status post arthroscopic surgery of left knee 08/15/2015   Vertigo    Wears dentures    full dentures on top, partial bottom    Current Outpatient Medications on File Prior to Visit  Medication Sig Dispense Refill   acetaminophen  (TYLENOL ) 500 MG tablet Take 2 tablets (1,000 mg total) by mouth every 6 (six) hours as needed (DO NOT EXCEED 4000 MG DAILY FROM ALL SOURCES). (Patient taking differently: Take 1,000 mg by mouth daily.) 30 tablet 2   albuterol  (VENTOLIN  HFA) 108 (90 Base) MCG/ACT inhaler Inhale 2 puffs into the lungs every 6 (six) hours as needed for wheezing or shortness of breath. 18 g 1  alum hydroxide-mag trisilicate (GAVISCON ) 80-20 MG CHEW chewable tablet Chew 1 tablet by mouth daily as needed for indigestion or heartburn.     budesonide -formoterol  (SYMBICORT ) 160-4.5 MCG/ACT inhaler Inhale 2 puffs into the lungs in the morning and at bedtime. 1 each 12   carvedilol  (COREG ) 12.5 MG tablet Take 1.5 tablets (18.75 mg total) by mouth 2 (two) times daily. 270 tablet 3   cephALEXin  (KEFLEX ) 500 MG capsule TAKE 4 PRIOR TO DENTAL PROCEDURES (Patient not taking: Reported on 06/22/2024) 4 capsule 1   clonazePAM  (KLONOPIN ) 0.5 MG tablet TAKE 1 TABLET AT BEDTIME AS NEEDED FOR ANXIETY 30 tablet 3   docusate sodium  (COLACE) 100 MG capsule Take 100 mg by mouth every other day. (Patient taking differently: Take 100 mg by mouth daily as needed.)     empagliflozin  (JARDIANCE ) 10 MG TABS tablet Take 1 tablet (10 mg  total) by mouth daily before breakfast. 30 tablet 11   Evolocumab  (REPATHA  SURECLICK) 140 MG/ML SOAJ Inject 140 mg into the skin every 14 (fourteen) days. 2 mL 3   furosemide  (LASIX ) 20 MG tablet Take 1 tablet (20 mg total) by mouth daily as needed (swelling, shortness of breath, gain 3 lbs overnight or 5 lbs in a week). 30 tablet 4   gabapentin  (NEURONTIN ) 300 MG capsule TAKE 2 CAPSULES AT BEDTIME 180 capsule 1   meclizine  (ANTIVERT ) 12.5 MG tablet Take 12.5 mg by mouth 3 (three) times daily as needed for dizziness. Dr. Salli     Multiple Vitamin (MULTIVITAMIN) tablet Take 1 tablet by mouth every morning.     omeprazole  (PRILOSEC) 20 MG capsule TAKE 1 CAPSULE TWICE A DAY BEFORE MEALS 180 capsule 1   potassium chloride  (KLOR-CON  M) 10 MEQ tablet Take 1 tablet (10 mEq total) by mouth as needed (with lasix ). 30 tablet 5   Probiotic Product (PROBIOTIC DAILY PO) Take 1 capsule by mouth daily.     sacubitril -valsartan  (ENTRESTO ) 97-103 MG Take 1 tablet by mouth 2 (two) times daily. 180 tablet 3   Spacer/Aero-Holding Chambers (AEROCHAMBER MV) inhaler Use as instructed 1 each 0   spironolactone  (ALDACTONE ) 25 MG tablet Take 1 tablet (25 mg total) by mouth daily. 90 tablet 3   tirzepatide  (MOUNJARO ) 12.5 MG/0.5ML Pen Inject 12.5 mg into the skin once a week. 2 mL 0   traMADol  (ULTRAM ) 50 MG tablet Take 50 mg by mouth daily.     VITAMIN D  PO Take 5,000 Units by mouth daily.     zolpidem  (AMBIEN ) 5 MG tablet TAKE 1 TABLET AT BEDTIME 90 tablet 0   No current facility-administered medications on file prior to visit.    Allergies  Allergen Reactions   Azithromycin Hives and Other (See Comments)   Fentanyl  Shortness Of Breath   Sulfa Antibiotics Other (See Comments) and Rash    Hallucinations   Oxycodone  Other (See Comments)    Makes head foggy and interferes with Meniere's syndrome.     Sulfacetamide    Beef-Derived Drug Products Other (See Comments)    Tears up my stomach.      Assessment/Plan:  1. Weight loss - Patient has not lost much weight on GLP-1. Will be increasing to 15mg  next month. We discussed the need for more exercise including both aerobics and strength. Explained the benefit muscle mass can have on fat loss. I reviewed options with her, gave her handout on strength exercises she can try. Also recommended water aerobics and a desk cycle. Patient not very receptive to suggestion of increased  exercise.  She believes there is something genetically wrong with her as to why she isn't losing weight. I advised that this is not my area to diagnose and even if there was, there would not be any solution other than what we are currently recommending for her. Patient provided with resources.    Newton Frutiger D Zyana Amaro, Pharm.JONETTA SARAN, CPP [facility] A Division of [facility] [address], [place], [place] [address]  Phone: [phone]; Fax: [phone]

## 2024-07-13 ENCOUNTER — Other Ambulatory Visit: Payer: Self-pay | Admitting: Internal Medicine

## 2024-07-13 ENCOUNTER — Encounter: Payer: Self-pay | Admitting: Internal Medicine

## 2024-07-13 DIAGNOSIS — F5101 Primary insomnia: Secondary | ICD-10-CM

## 2024-07-13 MED ORDER — ZOLPIDEM TARTRATE 5 MG PO TABS: 5.0000 mg | ORAL_TABLET | Freq: Every day | ORAL | 0 refills | Status: DC

## 2024-07-13 NOTE — Progress Notes (Unsigned)
 Date:  07/13/2024   Name:  Katrina Poole   DOB:  09-Mar-1946   MRN:  [id number]   Chief Complaint: No chief complaint on file.  HPI  Review of Systems   Lab Results  Component Value Date   NA 137 06/22/2024   K 5.3 (H) 06/22/2024   CO2 23 06/22/2024   GLUCOSE 70 06/22/2024   BUN 38 (H) 06/22/2024   CREATININE 1.15 (H) 06/22/2024   CALCIUM  9.4 06/22/2024   EGFR 49 (L) 06/22/2024   GFRNONAA 52 (L) 04/16/2023   Lab Results  Component Value Date   CHOL 120 10/21/2023   HDL 53 10/21/2023   LDLCALC 50 10/21/2023   TRIG 87 10/21/2023   CHOLHDL 2.3 10/21/2023   Lab Results  Component Value Date   TSH 3.050 05/05/2024   Lab Results  Component Value Date   HGBA1C 5.3 05/05/2024   Lab Results  Component Value Date   WBC 5.3 05/05/2024   HGB 11.9 05/05/2024   HCT 36.5 05/05/2024   MCV 98 (H) 05/05/2024   PLT 294 05/05/2024   Lab Results  Component Value Date   ALT 15 05/05/2024   AST 17 05/05/2024   ALKPHOS 87 05/05/2024   BILITOT 0.4 05/05/2024   No results found for: MARIEN BOLLS, VD25OH   Patient Active Problem List   Diagnosis Date Noted   Cardiomyopathy (HCC) 02/08/2023   Chronic HFrEF (heart failure with reduced ejection fraction) (HCC) 02/08/2023   CAD (coronary artery disease) 02/08/2023   Allergic pharyngitis 01/14/2023   Left bundle branch block 11/18/2022   DDD (degenerative disc disease), lumbar 10/09/2022   Lumbar spondylosis 09/17/2022   Prediabetes 04/22/2022   Spinal stenosis at L4-L5 level 02/09/2022   Lumbar radiculopathy 07/21/2021   Heart palpitations 07/01/2021   Mixed hyperlipidemia 07/01/2021   Generalized anxiety disorder 07/01/2021   Body mass index (BMI) 40.0-44.9, adult (HCC) 06/12/2021   Essential hypertension 08/16/2020   Meniere's disease 08/16/2020   Restless leg syndrome 08/16/2020   Primary insomnia 08/16/2020   Mild intermittent asthma without complication 08/16/2020   Diverticulosis of colon  08/16/2020   HNP (herniated nucleus pulposus), lumbar 04/29/2020   Gastroesophageal reflux disease without esophagitis 01/24/2020   Spondylosis of cervical spine 03/16/2018   Osteoarthritis of knee 02/06/2018   S/P total hip arthroplasty 11/25/2015    Allergies  Allergen Reactions   Azithromycin Hives and Other (See Comments)   Fentanyl  Shortness Of Breath   Sulfa Antibiotics Other (See Comments) and Rash    Hallucinations   Oxycodone  Other (See Comments)    Makes head foggy and interferes with Meniere's syndrome.     Sulfacetamide    Bovine (Beef) Protein-Containing Drug Products Other (See Comments)    Tears up my stomach.    Past Surgical History:  Procedure Laterality Date   ABDOMINAL EXPOSURE N/A 10/09/2022   Procedure: ABDOMINAL EXPOSURE;  Surgeon: Gretta Lonni PARAS, MD;  Location: [facility] OR;  Service: Vascular;  Laterality: N/A;   ANTERIOR CERVICAL DECOMP/DISCECTOMY FUSION N/A 03/16/2018   Procedure: Anterior Cervical Discectomy Fusion - Cervical Three- Cervical Four, Cervical Four- Cervical Five, Cervical Five-Cervical Six, Cervical Six- Cerival Seven;  Surgeon: Onetha Kuba, MD;  Location: [facility] OR;  Service: Neurosurgery;  Laterality: N/A;  Anterior Cervical Discectomy Fusion - Cervical Three- Cervical Four, Cervical Four- Cervical Five, Cervical Five-Cervical Six, Cervical Six- Ceriva   ANTERIOR LUMBAR FUSION N/A 10/09/2022   Procedure: Anteriorr Lumbar Interbody Fusion - Level Five-Sacral One  with posterior cortical screw  augmentation and decompressive laminotomy on the right;  Surgeon: Onetha Kuba, MD;  Location: [facility] OR;  Service: Neurosurgery;  Laterality: N/A;   APPENDECTOMY     BREAST SURGERY     Biopsy   BUNIONECTOMY     Left x1, Right x2.   CARDIAC CATHETERIZATION     x2 both normal   CARPAL TUNNEL RELEASE Right    COLONOSCOPY     COLONOSCOPY WITH PROPOFOL  N/A 06/04/2016   Procedure: COLONOSCOPY WITH PROPOFOL ;  Surgeon: Gladis RAYMOND Mariner, MD;  Location: [facility]  ENDOSCOPY;  Service: Endoscopy;  Laterality: N/A;   DILATION AND CURETTAGE OF UTERUS     ECTOPIC PREGNANCY SURGERY     ESOPHAGOGASTRODUODENOSCOPY (EGD) WITH PROPOFOL  N/A 06/04/2016   Procedure: ESOPHAGOGASTRODUODENOSCOPY (EGD) WITH PROPOFOL ;  Surgeon: Gladis RAYMOND Mariner, MD;  Location: [facility] ENDOSCOPY;  Service: Endoscopy;  Laterality: N/A;   exploratory laparoscopy     infertility surgery   EYE SURGERY     Catarac removal   FRACTURE SURGERY     HALLUX VALGUS CORRECTION Bilateral    HARDWARE REMOVAL Left 11/05/2023   Procedure: HARDWARE REMOVAL;  Surgeon: Lennie Barter, DPM;  Location: [facility] ORS;  Service: Orthopedics/Podiatry;  Laterality: Left;   JOINT REPLACEMENT Bilateral    partial knee   KNEE ARTHROPLASTY Left 01/27/2021   Procedure: CONVERSION OF LEFT PARTIAL KNEE TO LEFT TOTAL KNEE.;  Surgeon: Mardee Lynwood SQUIBB, MD;  Location: [facility] ORS;  Service: Orthopedics;  Laterality: Left;   KNEE ARTHROSCOPY Left 07/19/2015   Procedure: Arthroscopic limited synovectomy along with lateral compartment and retropatellar chondral debridement plus removal of several small loose bodies;  Surgeon: Helayne Glenn, MD;  Location: [facility];  Service: Orthopedics;  Laterality: Left;  1ST CASE PER CECE   LAMINECTOMY WITH POSTERIOR LATERAL ARTHRODESIS LEVEL 2 N/A 10/09/2022   Procedure: LAMINECTOMY WITH POSTERIOR LATERAL ARTHRODESIS LEVEL TWO;  Surgeon: Onetha Kuba, MD;  Location: [facility] OR;  Service: Neurosurgery;  Laterality: N/A;   LEFT HEART CATH AND CORONARY ANGIOGRAPHY Left 02/08/2023   Procedure: LEFT HEART CATH AND CORONARY ANGIOGRAPHY;  Surgeon: Darron Deatrice LABOR, MD;  Location: [facility] INVASIVE CV LAB;  Service: Cardiovascular;  Laterality: Left;   LUMBAR LAMINECTOMY/DECOMPRESSION MICRODISCECTOMY Right 04/29/2020   Procedure: Microdiscectomy - Lumbar Three-Lumbar Four - Lumbar Four-Lumbar Five - right;  Surgeon: Onetha Kuba, MD;  Location: [facility] OR;  Service: Neurosurgery;  Laterality: Right;   Microdiscectomy - Lumbar Three-Lumbar Four - Lumbar Four-Lumbar Five - right   MEDIAL PARTIAL KNEE REPLACEMENT Bilateral    POSTERIOR LUMBAR FUSION  02/2022   L3-4 and L4-5   SHOULDER SURGERY Right    SPINE SURGERY     C3-7 fused   TOTAL HIP ARTHROPLASTY Left 11/25/2015   Procedure: TOTAL HIP ARTHROPLASTY;  Surgeon: Lynwood SQUIBB Mardee, MD;  Location: [facility] ORS;  Service: Orthopedics;  Laterality: Left;   TRIGGER FINGER RELEASE Left     Social History   Tobacco Use   Smoking status: Former    Current packs/day: 0.00    Average packs/day: 1.5 packs/day for 30.0 years (45.0 ttl pk-yrs)    Types: Cigarettes    Start date: 11/12/1960    Quit date: 11/12/1990    Years since quitting: 33.6   Smokeless tobacco: Never  Vaping Use   Vaping status: Never Used  Substance Use Topics   Alcohol use: Yes    Alcohol/week: 1.0 standard drink of alcohol    Types: 1 Standard drinks or equivalent per week    Comment: OCC [place],  maybe 1 drink a week   Drug use: No     Medication list has been reviewed and updated.  No outpatient medications have been marked as taking for the 07/13/24 encounter (Orders Only) with Justus Leita DEL, MD.       04/14/2024    8:13 AM 10/12/2023    9:18 AM 04/13/2023    8:13 AM 11/18/2022    8:58 AM  GAD 7 : Generalized Anxiety Score  Nervous, Anxious, on Edge 0 0 0 0  Control/stop worrying 0 0 0 0  Worry too much - different things 0 0 1 0  Trouble relaxing 0 0 1 0  Restless 1 0 1 0  Easily annoyed or irritable 0 0 1 0  Afraid - awful might happen 0 0 0 0  Total GAD 7 Score 1 0 4 0  Anxiety Difficulty Not difficult at all Not difficult at all Not difficult at all Not difficult at all       04/14/2024    8:13 AM 10/20/2023    8:06 AM 10/12/2023    9:17 AM  Depression screen PHQ 2/9  Decreased Interest 0 0 0  Down, Depressed, Hopeless 0 0 0  PHQ - 2 Score 0 0 0  Altered sleeping 1 0 0  Tired, decreased energy 1 0 0  Change in appetite 0 0 0  Feeling bad  or failure about yourself  0 0 0  Trouble concentrating 0 0 0  Moving slowly or fidgety/restless 0 0 0  Suicidal thoughts 0 0 0  PHQ-9 Score 2 0 0  Difficult doing work/chores Not difficult at all Not difficult at all Not difficult at all    BP Readings from Last 3 Encounters:  06/22/24 129/61  04/14/24 120/64  02/02/24 122/66    Physical Exam  Wt Readings from Last 3 Encounters:  06/22/24 199 lb (90.3 kg)  04/14/24 194 lb (88 kg)  02/02/24 198 lb 8 oz (90 kg)    There were no vitals taken for this visit.  Assessment and Plan:  Problem List Items Addressed This Visit   None   No follow-ups on file.    Leita HILARIO Justus, MD [facility] [place]

## 2024-07-13 NOTE — Telephone Encounter (Signed)
 Please review and send RX if appropriate.  JM

## 2024-07-17 ENCOUNTER — Ambulatory Visit (INDEPENDENT_AMBULATORY_CARE_PROVIDER_SITE_OTHER): Admitting: Internal Medicine

## 2024-07-17 ENCOUNTER — Encounter: Payer: Self-pay | Admitting: Pulmonary Disease

## 2024-07-17 ENCOUNTER — Ambulatory Visit: Admitting: Pulmonary Disease

## 2024-07-17 ENCOUNTER — Encounter: Payer: Self-pay | Admitting: Internal Medicine

## 2024-07-17 VITALS — BP 122/76 | HR 82 | Ht <= 58 in | Wt 195.0 lb

## 2024-07-17 VITALS — BP 120/78 | HR 71 | Temp 97.4°F | Ht <= 58 in | Wt 195.0 lb

## 2024-07-17 DIAGNOSIS — J4520 Mild intermittent asthma, uncomplicated: Secondary | ICD-10-CM

## 2024-07-17 DIAGNOSIS — N393 Stress incontinence (female) (male): Secondary | ICD-10-CM | POA: Diagnosis not present

## 2024-07-17 NOTE — Progress Notes (Signed)
 Synopsis: Referred in by Katrina Leita DEL, MD   Subjective:   PATIENT ID: Katrina Poole GENDER: female DOB: [birth year], MRN: [id number]  Chief Complaint  Patient presents with   Asthma    Breathing is fine. DOE. No wheezing. Cough.  Not using the Symbicort  or Albuterol  inhaler.    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   Seen initially in August 2024; She has been complaining of shortness of breaht specifically on exertion and going uphill for years. She does report intermittent productive cough. She has hypersensitivity to perfumes but not so much cold air or dust. She was given the diagnosis of asthma early 2000s and has been on albuterol  prn which helps. Now with the recent diagnosis of NICM she unsure what is causing her shortness of breath. She never required hospitalization for asthma nor has she required systemic steroids.   During her last visit in Nov 2024 we started her on Symbicort  160-4.5 2 puffs bid but she hasn't been using it given bad taste. Currently asymptomatic denies any chest tightness, wheezing, shortness of breath or chest pain.   Family History: Mother with COPD (Smoker)   Social History - Quit smoking 30 years ago, smoked 1 and half pack per day for 20 years. Occasional alcohol use. No illicit drug use. 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 No pets at home.   OV 07/17/2024 - Katrina Poole is doing overall well. She is still experiencing some dyspnea specifically going uphill. She tries to remain active and has had no issues with travelling and walking around during that period. She recently been to [place]  and had some issues with high humidity. She is using her symbicort  once a day and  her rescue inhaler only as needed. Advised to use symbicort  1 puff BID which she will. She is actively working on losing weight which I have encouraged. I will see her again in 6 months.   ROS All systems were reviewed and are negative except for the above.  Objective:   Vitals:   07/17/24 0941  BP: 120/78  Pulse: 71  Temp: (!) 97.4 F (36.3 C)  SpO2: 100%  Weight: 195 lb (88.5 kg)  Height: 4' 10 (1.473 m)    100% on RA BMI Readings from Last 3 Encounters:  07/17/24 40.76 kg/m  06/22/24 41.59 kg/m  04/14/24 40.55 kg/m   Wt Readings from Last 3 Encounters:  07/17/24 195 lb (88.5 kg)  06/22/24 199 lb (90.3 kg)  04/14/24 194 lb (88 kg)    Physical Exam GEN: NAD, Healthy Appearing HEENT: Supple Neck, Reactive Pupils, EOMI  CVS: Normal S1, Normal S2, RRR, No murmurs or ES appreciated  Lungs: Clear bilateral air entry.  Abdomen: Soft, non tender, non distended, + BS  Extremities: Warm and well perfused, No edema   Labs and imaging were reviewed.   Ancillary Information   CBC    Component Value Date/Time   WBC 5.3 05/05/2024 1001   WBC 7.9 08/23/2023 1034   RBC 3.73 (L) 05/05/2024 1001   RBC 3.92 08/23/2023 1034   HGB 11.9 05/05/2024 1001   HCT 36.5 05/05/2024 1001   PLT 294 05/05/2024 1001   MCV 98 (H) 05/05/2024 1001   MCH 31.9 05/05/2024 1001   MCH  31.1 08/23/2023 1034   MCHC 32.6 05/05/2024 1001   MCHC 33.1 08/23/2023 1034   RDW 13.5 05/05/2024 1001   LYMPHSABS 1.1 05/05/2024 1001   MONOABS 0.7 08/23/2023 1034   EOSABS 0.1 05/05/2024 1001   BASOSABS 0.1 05/05/2024 1001    Imaging   PFTs  08/12/2023 FEV1 normal at 1.47 L within normal limits, FVC at 1.85 L also within normal limits FEV1 to FVC ratio normal at 80.  No significant change postbronchodilator.  However there was somewhat observed changed with FEV1 by 0.11 L +7% change as well as FVC at 0.13 L +7% change.  Total lung volume was normal at 4.46 L 100% of predicted RV was slightly elevated at  106% of predicted possibly showing some air trapping.  DLCO was mildly reduced at 70% of predicted however when corrected to alveolar ventilation at 78% of predicted.  CXR May 2024: Diffuse interstitial and alveolar opacities consistent with pulmonary edema.  Echocardiogram 04/19:   1. Left ventricular ejection fraction, by estimation, is 25 to 30%. Left  ventricular ejection fraction by 2D MOD biplane is 30.8 %. The left  ventricle has severely decreased function. The left ventricle demonstrates  global hypokinesis. The left  ventricular internal cavity size was moderately dilated. Left ventricular  diastolic parameters are consistent with Grade I diastolic dysfunction  (impaired relaxation). The average left ventricular global longitudinal  strain is -12.3 %.   2. Right ventricular systolic function is normal. The right ventricular  size is normal. Tricuspid regurgitation signal is inadequate for assessing  PA pressure.   3. Left atrial size was moderately dilated.   4. The mitral valve is normal in structure. Moderate mitral valve  regurgitation. No evidence of mitral stenosis.   5. The aortic valve is normal in structure. Aortic valve regurgitation is  not visualized. No aortic stenosis is present.   6. The inferior vena cava is normal in size with greater than 50%  respiratory variability, suggesting right atrial pressure of 3 mmHg.        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   # Mild intermittent asthma (FENO 11) #COPD overlap Gold stage I # Nonischemic cardiomyopathy with EF 25 to 30% 04/19 [facility] 02/2023 w/ NOCAD. Cardiac MR with no sings infiltrative diseases.  #GERD #Morbid Obesity  Her dyspnea on exertion could be multifactorial in the setting of Non ischemic CM, mild intermittent asthma, Morbid Obesity and GERD.   We discussed the importance of weightloss and its impact on asthma and multiple other comorbidities. I  think she would highly benefit from weight loss strategies including Ozempic. I advised her to discuss that with her PCP.   []  Ok to use budesonide -Formoterol  [Symbicort ] 160-4.5 1 puffs BID. Provided Aerochamber MV as well for easier use.   []  C/w Albuterol  as needed  []  C/w PPI  []  continue working on weightloss strategies.  RTC 6 months.  I personally spent a total of 30 minutes in the care of the patient today including preparing to see the patient, getting/reviewing separately obtained history, performing a medically appropriate exam/evaluation, counseling and educating, documenting clinical information in the EHR, independently interpreting results, and communicating results.   Darrin Barn, MD [facility] 07/17/2024 10:18 AM

## 2024-07-17 NOTE — Progress Notes (Signed)
 Date:  07/17/2024   Name:  Katrina Poole   DOB:  1946/02/21   MRN:  [id number]   Chief Complaint: Referral  Urinary Frequency  This is a chronic problem. The problem has been gradually worsening. Associated symptoms include frequency. Pertinent negatives include no chills or urgency.  She had an incontinence procedure about 40 years ago with a sling type procedure.  She has done well since then but gradually having more leakage, esp when in the shower.  Review of Systems  Constitutional:  Negative for chills, fatigue and fever.  Respiratory:  Negative for cough and chest tightness.   Cardiovascular:  Negative for chest pain.  Genitourinary:  Positive for frequency. Negative for dysuria, pelvic pain, urgency and vaginal pain.     Lab Results  Component Value Date   NA 137 06/22/2024   K 5.3 (H) 06/22/2024   CO2 23 06/22/2024   GLUCOSE 70 06/22/2024   BUN 38 (H) 06/22/2024   CREATININE 1.15 (H) 06/22/2024   CALCIUM  9.4 06/22/2024   EGFR 49 (L) 06/22/2024   GFRNONAA 52 (L) 04/16/2023   Lab Results  Component Value Date   CHOL 120 10/21/2023   HDL 53 10/21/2023   LDLCALC 50 10/21/2023   TRIG 87 10/21/2023   CHOLHDL 2.3 10/21/2023   Lab Results  Component Value Date   TSH 3.050 05/05/2024   Lab Results  Component Value Date   HGBA1C 5.3 05/05/2024   Lab Results  Component Value Date   WBC 5.3 05/05/2024   HGB 11.9 05/05/2024   HCT 36.5 05/05/2024   MCV 98 (H) 05/05/2024   PLT 294 05/05/2024   Lab Results  Component Value Date   ALT 15 05/05/2024   AST 17 05/05/2024   ALKPHOS 87 05/05/2024   BILITOT 0.4 05/05/2024   No results found for: MARIEN BOLLS, VD25OH   Patient Active Problem List   Diagnosis Date Noted   Cardiomyopathy (HCC) 02/08/2023   Chronic HFrEF (heart failure with reduced ejection fraction) (HCC) 02/08/2023   CAD (coronary artery disease) 02/08/2023   Allergic pharyngitis 01/14/2023   Left bundle branch block  11/18/2022   Prediabetes 04/22/2022   Spinal stenosis at L4-L5 level 07/21/2021   Heart palpitations 07/01/2021   Mixed hyperlipidemia 07/01/2021   Generalized anxiety disorder 07/01/2021   Body mass index (BMI) 40.0-44.9, adult (HCC) 06/12/2021   Essential hypertension 08/16/2020   Meniere's disease 08/16/2020   Restless leg syndrome 08/16/2020   Primary insomnia 08/16/2020   Mild intermittent asthma without complication 08/16/2020   Diverticulosis of colon 08/16/2020   Gastroesophageal reflux disease without esophagitis 01/24/2020   Osteoarthritis of knee 02/06/2018   S/P total hip arthroplasty 11/25/2015    Allergies  Allergen Reactions   Azithromycin Hives and Other (See Comments)   Fentanyl  Shortness Of Breath   Sulfa Antibiotics Other (See Comments) and Rash    Hallucinations   Oxycodone  Other (See Comments)    Makes head foggy and interferes with Meniere's syndrome.     Sulfacetamide    Bovine (Beef) Protein-Containing Drug Products Other (See Comments)    Tears up my stomach.    Past Surgical History:  Procedure Laterality Date   ABDOMINAL EXPOSURE N/A 10/09/2022   Procedure: ABDOMINAL EXPOSURE;  Surgeon: Gretta Lonni PARAS, MD;  Location: [facility] OR;  Service: Vascular;  Laterality: N/A;   ANTERIOR CERVICAL DECOMP/DISCECTOMY FUSION N/A 03/16/2018   Procedure: Anterior Cervical Discectomy Fusion - Cervical Three- Cervical Four, Cervical Four- Cervical Five, Cervical Five-Cervical Six,  Cervical Six- Cerival Seven;  Surgeon: Onetha Kuba, MD;  Location: [facility] OR;  Service: Neurosurgery;  Laterality: N/A;  Anterior Cervical Discectomy Fusion - Cervical Three- Cervical Four, Cervical Four- Cervical Five, Cervical Five-Cervical Six, Cervical Six- Ceriva   ANTERIOR LUMBAR FUSION N/A 10/09/2022   Procedure: Anteriorr Lumbar Interbody Fusion - Level Five-Sacral One  with posterior cortical screw augmentation and decompressive laminotomy on the right;  Surgeon: Onetha Kuba, MD;  Location:  [facility] OR;  Service: Neurosurgery;  Laterality: N/A;   APPENDECTOMY     BREAST SURGERY     Biopsy   BUNIONECTOMY     Left x1, Right x2.   CARDIAC CATHETERIZATION     x2 both normal   CARPAL TUNNEL RELEASE Right    COLONOSCOPY     COLONOSCOPY WITH PROPOFOL  N/A 06/04/2016   Procedure: COLONOSCOPY WITH PROPOFOL ;  Surgeon: Gladis RAYMOND Mariner, MD;  Location: [facility] ENDOSCOPY;  Service: Endoscopy;  Laterality: N/A;   DILATION AND CURETTAGE OF UTERUS     ECTOPIC PREGNANCY SURGERY     ESOPHAGOGASTRODUODENOSCOPY (EGD) WITH PROPOFOL  N/A 06/04/2016   Procedure: ESOPHAGOGASTRODUODENOSCOPY (EGD) WITH PROPOFOL ;  Surgeon: Gladis RAYMOND Mariner, MD;  Location: [facility] ENDOSCOPY;  Service: Endoscopy;  Laterality: N/A;   exploratory laparoscopy     infertility surgery   EYE SURGERY     Catarac removal   FRACTURE SURGERY     HALLUX VALGUS CORRECTION Bilateral    HARDWARE REMOVAL Left 11/05/2023   Procedure: HARDWARE REMOVAL;  Surgeon: Lennie Barter, DPM;  Location: [facility] ORS;  Service: Orthopedics/Podiatry;  Laterality: Left;   JOINT REPLACEMENT Bilateral    partial knee   KNEE ARTHROPLASTY Left 01/27/2021   Procedure: CONVERSION OF LEFT PARTIAL KNEE TO LEFT TOTAL KNEE.;  Surgeon: Mardee Lynwood SQUIBB, MD;  Location: [facility] ORS;  Service: Orthopedics;  Laterality: Left;   KNEE ARTHROSCOPY Left 07/19/2015   Procedure: Arthroscopic limited synovectomy along with lateral compartment and retropatellar chondral debridement plus removal of several small loose bodies;  Surgeon: Helayne Glenn, MD;  Location: [facility];  Service: Orthopedics;  Laterality: Left;  1ST CASE PER CECE   LAMINECTOMY WITH POSTERIOR LATERAL ARTHRODESIS LEVEL 2 N/A 10/09/2022   Procedure: LAMINECTOMY WITH POSTERIOR LATERAL ARTHRODESIS LEVEL TWO;  Surgeon: Onetha Kuba, MD;  Location: [facility] OR;  Service: Neurosurgery;  Laterality: N/A;   LEFT HEART CATH AND CORONARY ANGIOGRAPHY Left 02/08/2023   Procedure: LEFT HEART CATH AND CORONARY ANGIOGRAPHY;   Surgeon: Darron Deatrice LABOR, MD;  Location: [facility] INVASIVE CV LAB;  Service: Cardiovascular;  Laterality: Left;   LUMBAR LAMINECTOMY/DECOMPRESSION MICRODISCECTOMY Right 04/29/2020   Procedure: Microdiscectomy - Lumbar Three-Lumbar Four - Lumbar Four-Lumbar Five - right;  Surgeon: Onetha Kuba, MD;  Location: [facility] OR;  Service: Neurosurgery;  Laterality: Right;  Microdiscectomy - Lumbar Three-Lumbar Four - Lumbar Four-Lumbar Five - right   MEDIAL PARTIAL KNEE REPLACEMENT Bilateral    POSTERIOR LUMBAR FUSION  02/2022   L3-4 and L4-5   SHOULDER SURGERY Right    SPINE SURGERY     C3-7 fused   TOTAL HIP ARTHROPLASTY Left 11/25/2015   Procedure: TOTAL HIP ARTHROPLASTY;  Surgeon: Lynwood SQUIBB Mardee, MD;  Location: [facility] ORS;  Service: Orthopedics;  Laterality: Left;   TRIGGER FINGER RELEASE Left     Social History   Tobacco Use   Smoking status: Former    Current packs/day: 0.00    Average packs/day: 1.5 packs/day for 30.0 years (45.0 ttl pk-yrs)    Types: Cigarettes    Start date: 11/12/1960  Quit date: 11/12/1990    Years since quitting: 33.7   Smokeless tobacco: Never  Vaping Use   Vaping status: Never Used  Substance Use Topics   Alcohol use: Yes    Alcohol/week: 1.0 standard drink of alcohol    Types: 1 Standard drinks or equivalent per week    Comment: OCC GL[place] OF BRANDY, maybe 1 drink a week   Drug use: No     Medication list has been reviewed and updated.  Current Meds  Medication Sig   acetaminophen  (TYLENOL ) 500 MG tablet Take 2 tablets (1,000 mg total) by mouth every 6 (six) hours as needed (DO NOT EXCEED 4000 MG DAILY FROM ALL SOURCES). (Patient taking differently: Take 1,000 mg by mouth daily.)   albuterol  (VENTOLIN  HFA) 108 (90 Base) MCG/ACT inhaler Inhale 2 puffs into the lungs every 6 (six) hours as needed for wheezing or shortness of breath.   alum hydroxide-mag trisilicate (GAVISCON ) 80-20 MG CHEW chewable tablet Chew 1 tablet by mouth daily as needed for indigestion or  heartburn.   budesonide -formoterol  (SYMBICORT ) 160-4.5 MCG/ACT inhaler Inhale 2 puffs into the lungs in the morning and at bedtime.   carvedilol  (COREG ) 12.5 MG tablet Take 1.5 tablets (18.75 mg total) by mouth 2 (two) times daily.   clonazePAM  (KLONOPIN ) 0.5 MG tablet TAKE 1 TABLET AT BEDTIME AS NEEDED FOR ANXIETY   docusate sodium  (COLACE) 100 MG capsule Take 100 mg by mouth every other day. (Patient taking differently: Take 100 mg by mouth daily as needed.)   empagliflozin  (JARDIANCE ) 10 MG TABS tablet Take 1 tablet (10 mg total) by mouth daily before breakfast.   Evolocumab  (REPATHA  SURECLICK) 140 MG/ML SOAJ Inject 140 mg into the skin every 14 (fourteen) days.   furosemide  (LASIX ) 20 MG tablet Take 1 tablet (20 mg total) by mouth daily as needed (swelling, shortness of breath, gain 3 lbs overnight or 5 lbs in a week).   gabapentin  (NEURONTIN ) 300 MG capsule TAKE 2 CAPSULES AT BEDTIME   meclizine  (ANTIVERT ) 12.5 MG tablet Take 12.5 mg by mouth 3 (three) times daily as needed for dizziness. Dr. Salli   Multiple Vitamin (MULTIVITAMIN) tablet Take 1 tablet by mouth every morning.   omeprazole  (PRILOSEC) 20 MG capsule TAKE 1 CAPSULE TWICE A DAY BEFORE MEALS   potassium chloride  (KLOR-CON  M) 10 MEQ tablet Take 1 tablet (10 mEq total) by mouth as needed (with lasix ).   Probiotic Product (PROBIOTIC DAILY PO) Take 1 capsule by mouth daily.   sacubitril -valsartan  (ENTRESTO ) 97-103 MG Take 1 tablet by mouth 2 (two) times daily.   Spacer/Aero-Holding Chambers (AEROCHAMBER MV) inhaler Use as instructed   spironolactone  (ALDACTONE ) 25 MG tablet Take 1 tablet (25 mg total) by mouth daily.   tirzepatide  (MOUNJARO ) 12.5 MG/0.5ML Pen Inject 12.5 mg into the skin once a week.   traMADol  (ULTRAM ) 50 MG tablet Take 50 mg by mouth daily.   VITAMIN D  PO Take 5,000 Units by mouth daily.   zolpidem  (AMBIEN ) 5 MG tablet Take 1 tablet (5 mg total) by mouth at bedtime.       07/17/2024   11:14 AM 04/14/2024    8:13  AM 10/12/2023    9:18 AM 04/13/2023    8:13 AM  GAD 7 : Generalized Anxiety Score  Nervous, Anxious, on Edge 0 0 0 0  Control/stop worrying 0 0 0 0  Worry too much - different things 0 0 0 1  Trouble relaxing 0 0 0 1  Restless 0 1 0 1  Easily annoyed or irritable 0 0 0 1  Afraid - awful might happen 0 0 0 0  Total GAD 7 Score 0 1 0 4  Anxiety Difficulty Not difficult at all Not difficult at all Not difficult at all Not difficult at all       07/17/2024   11:14 AM 04/14/2024    8:13 AM 10/20/2023    8:06 AM  Depression screen PHQ 2/9  Decreased Interest 0 0 0  Down, Depressed, Hopeless 0 0 0  PHQ - 2 Score 0 0 0  Altered sleeping 0 1 0  Tired, decreased energy 0 1 0  Change in appetite 0 0 0  Feeling bad or failure about yourself  0 0 0  Trouble concentrating 0 0 0  Moving slowly or fidgety/restless 0 0 0  Suicidal thoughts 0 0 0  PHQ-9 Score 0 2 0  Difficult doing work/chores Not difficult at all Not difficult at all Not difficult at all    BP Readings from Last 3 Encounters:  07/17/24 122/76  07/17/24 120/78  06/22/24 129/61    Physical Exam Vitals and nursing note reviewed.  Constitutional:      General: She is not in acute distress.    Appearance: Normal appearance. She is well-developed.  HENT:     Head: Normocephalic and atraumatic.  Cardiovascular:     Rate and Rhythm: Normal rate and regular rhythm.  Pulmonary:     Effort: Pulmonary effort is normal. No respiratory distress.     Breath sounds: No wheezing or rhonchi.  Abdominal:     Palpations: Abdomen is soft.     Tenderness: There is no abdominal tenderness. There is no right CVA tenderness or left CVA tenderness.  Skin:    General: Skin is warm and dry.     Findings: No rash.  Neurological:     Mental Status: She is alert and oriented to person, place, and time.  Psychiatric:        Mood and Affect: Mood normal.        Behavior: Behavior normal.     Wt Readings from Last 3 Encounters:  07/17/24  195 lb (88.5 kg)  07/17/24 195 lb (88.5 kg)  06/22/24 199 lb (90.3 kg)    BP 122/76   Pulse 82   Ht 4' 10 (1.473 m)   Wt 195 lb (88.5 kg)   SpO2 97%   BMI 40.76 kg/m   Assessment and Plan:  Problem List Items Addressed This Visit   None Visit Diagnoses       Stress incontinence of urine    -  Primary   Relevant Orders   Ambulatory referral to Urology       No follow-ups on file.    Leita HILARIO Adie, MD [facility] [place]

## 2024-07-31 ENCOUNTER — Telehealth: Payer: Self-pay

## 2024-07-31 DIAGNOSIS — Z6841 Body Mass Index (BMI) 40.0 and over, adult: Secondary | ICD-10-CM

## 2024-07-31 NOTE — Telephone Encounter (Signed)
 Referral placed and faxed to [facility] [place] [facility], along with demos and med list, per Dr. Mclean.

## 2024-08-07 ENCOUNTER — Ambulatory Visit (INDEPENDENT_AMBULATORY_CARE_PROVIDER_SITE_OTHER): Admitting: Internal Medicine

## 2024-08-07 ENCOUNTER — Encounter: Payer: Self-pay | Admitting: Internal Medicine

## 2024-08-07 ENCOUNTER — Ambulatory Visit: Payer: Self-pay

## 2024-08-07 VITALS — BP 122/74 | HR 72 | Ht <= 58 in | Wt 195.0 lb

## 2024-08-07 DIAGNOSIS — R197 Diarrhea, unspecified: Secondary | ICD-10-CM

## 2024-08-07 DIAGNOSIS — M6281 Muscle weakness (generalized): Secondary | ICD-10-CM

## 2024-08-07 NOTE — Telephone Encounter (Signed)
 Noted  Pt has appt.  KP

## 2024-08-07 NOTE — Progress Notes (Signed)
 Date:  08/07/2024   Name:  Katrina Poole   DOB:  [birth year]   MRN:  [id number]   Chief Complaint: Generalized Body Aches (Started 1 week ago. Patient said this started with diarrhea on and off. Body pains started Thursday night. She said she feels weak and is not sleeping well due to diarrhea and generalized body pains. Patients gait was slower than normal, and she had a hard time standing when called from the waiting room.)  Diarrhea  This is a new problem. The current episode started in the past 7 days. The problem occurs 2 to 4 times per day. The problem has been gradually improving. Diarrhea characteristics: last night had a softer, less liquid stools. Associated symptoms include arthralgias and myalgias. Pertinent negatives include no chills, fever, sweats, vomiting or weight loss. She has tried nothing for the symptoms.  Myalgias and proximal muscle weakness - started three days after onset of severe watery diarrhea.  She did not supplement with liquid IV until the symptoms were much worse but has only used one packet over the past three days.  Symptoms are improved on the left.  The right shoulder is very sore and painful to lift.  Her right thigh is also tender and leg feels heavy with sensation that it might give way.  She is able to get up from sitting and walks slowly.  Review of Systems  Constitutional:  Negative for appetite change, chills, diaphoresis, fatigue, fever and weight loss.  Respiratory:  Negative for chest tightness and shortness of breath.   Cardiovascular:  Negative for chest pain and palpitations.  Gastrointestinal:  Positive for diarrhea. Negative for vomiting.  Musculoskeletal:  Positive for arthralgias and myalgias.  Psychiatric/Behavioral:  Negative for dysphoric mood and sleep disturbance. The patient is not nervous/anxious.      Lab Results  Component Value Date   NA 137 06/22/2024   K 5.3 (H) 06/22/2024   CO2 23 06/22/2024   GLUCOSE 70 06/22/2024    BUN 38 (H) 06/22/2024   CREATININE 1.15 (H) 06/22/2024   CALCIUM  9.4 06/22/2024   EGFR 49 (L) 06/22/2024   GFRNONAA 52 (L) 04/16/2023   Lab Results  Component Value Date   CHOL 120 10/21/2023   HDL 53 10/21/2023   LDLCALC 50 10/21/2023   TRIG 87 10/21/2023   CHOLHDL 2.3 10/21/2023   Lab Results  Component Value Date   TSH 3.050 05/05/2024   Lab Results  Component Value Date   HGBA1C 5.3 05/05/2024   Lab Results  Component Value Date   WBC 5.3 05/05/2024   HGB 11.9 05/05/2024   HCT 36.5 05/05/2024   MCV 98 (H) 05/05/2024   PLT 294 05/05/2024   Lab Results  Component Value Date   ALT 15 05/05/2024   AST 17 05/05/2024   ALKPHOS 87 05/05/2024   BILITOT 0.4 05/05/2024   No results found for: MARIEN BOLLS, VD25OH   Patient Active Problem List   Diagnosis Date Noted   Cardiomyopathy (HCC) 02/08/2023   Chronic HFrEF (heart failure with reduced ejection fraction) (HCC) 02/08/2023   CAD (coronary artery disease) 02/08/2023   Allergic pharyngitis 01/14/2023   Left bundle branch block 11/18/2022   Prediabetes 04/22/2022   Spinal stenosis at L4-L5 level 07/21/2021   Heart palpitations 07/01/2021   Mixed hyperlipidemia 07/01/2021   Generalized anxiety disorder 07/01/2021   Body mass index (BMI) 40.0-44.9, adult (HCC) 06/12/2021   Essential hypertension 08/16/2020   Meniere's disease 08/16/2020   Restless leg  syndrome 08/16/2020   Primary insomnia 08/16/2020   Mild intermittent asthma without complication 08/16/2020   Diverticulosis of colon 08/16/2020   Gastroesophageal reflux disease without esophagitis 01/24/2020   Osteoarthritis of knee 02/06/2018   S/P total hip arthroplasty 11/25/2015    Allergies  Allergen Reactions   Azithromycin Hives and Other (See Comments)   Fentanyl  Shortness Of Breath   Sulfa Antibiotics Other (See Comments) and Rash    Hallucinations   Oxycodone  Other (See Comments)    Makes head foggy and interferes with Meniere's  syndrome.     Sulfacetamide    Bovine (Beef) Protein-Containing Drug Products Other (See Comments)    Tears up my stomach.    Past Surgical History:  Procedure Laterality Date   ABDOMINAL EXPOSURE N/A 10/09/2022   Procedure: ABDOMINAL EXPOSURE;  Surgeon: Gretta Lonni PARAS, MD;  Location: [facility] OR;  Service: Vascular;  Laterality: N/A;   ANTERIOR CERVICAL DECOMP/DISCECTOMY FUSION N/A 03/16/2018   Procedure: Anterior Cervical Discectomy Fusion - Cervical Three- Cervical Four, Cervical Four- Cervical Five, Cervical Five-Cervical Six, Cervical Six- Cerival Seven;  Surgeon: Onetha Kuba, MD;  Location: [facility] OR;  Service: Neurosurgery;  Laterality: N/A;  Anterior Cervical Discectomy Fusion - Cervical Three- Cervical Four, Cervical Four- Cervical Five, Cervical Five-Cervical Six, Cervical Six- Ceriva   ANTERIOR LUMBAR FUSION N/A 10/09/2022   Procedure: Anteriorr Lumbar Interbody Fusion - Level Five-Sacral One  with posterior cortical screw augmentation and decompressive laminotomy on the right;  Surgeon: Onetha Kuba, MD;  Location: [facility] OR;  Service: Neurosurgery;  Laterality: N/A;   APPENDECTOMY     BREAST SURGERY     Biopsy   BUNIONECTOMY     Left x1, Right x2.   CARDIAC CATHETERIZATION     x2 both normal   CARPAL TUNNEL RELEASE Right    COLONOSCOPY     COLONOSCOPY WITH PROPOFOL  N/A 06/04/2016   Procedure: COLONOSCOPY WITH PROPOFOL ;  Surgeon: Gladis RAYMOND Mariner, MD;  Location: [facility] ENDOSCOPY;  Service: Endoscopy;  Laterality: N/A;   DILATION AND CURETTAGE OF UTERUS     ECTOPIC PREGNANCY SURGERY     ESOPHAGOGASTRODUODENOSCOPY (EGD) WITH PROPOFOL  N/A 06/04/2016   Procedure: ESOPHAGOGASTRODUODENOSCOPY (EGD) WITH PROPOFOL ;  Surgeon: Gladis RAYMOND Mariner, MD;  Location: [facility] ENDOSCOPY;  Service: Endoscopy;  Laterality: N/A;   exploratory laparoscopy     infertility surgery   EYE SURGERY     Catarac removal   FRACTURE SURGERY     HALLUX VALGUS CORRECTION Bilateral    HARDWARE REMOVAL Left 11/05/2023    Procedure: HARDWARE REMOVAL;  Surgeon: Lennie Barter, DPM;  Location: [facility] ORS;  Service: Orthopedics/Podiatry;  Laterality: Left;   JOINT REPLACEMENT Bilateral    partial knee   KNEE ARTHROPLASTY Left 01/27/2021   Procedure: CONVERSION OF LEFT PARTIAL KNEE TO LEFT TOTAL KNEE.;  Surgeon: Mardee Lynwood SQUIBB, MD;  Location: [facility] ORS;  Service: Orthopedics;  Laterality: Left;   KNEE ARTHROSCOPY Left 07/19/2015   Procedure: Arthroscopic limited synovectomy along with lateral compartment and retropatellar chondral debridement plus removal of several small loose bodies;  Surgeon: Helayne Glenn, MD;  Location: [facility];  Service: Orthopedics;  Laterality: Left;  1ST CASE PER CECE   LAMINECTOMY WITH POSTERIOR LATERAL ARTHRODESIS LEVEL 2 N/A 10/09/2022   Procedure: LAMINECTOMY WITH POSTERIOR LATERAL ARTHRODESIS LEVEL TWO;  Surgeon: Onetha Kuba, MD;  Location: [facility] OR;  Service: Neurosurgery;  Laterality: N/A;   LEFT HEART CATH AND CORONARY ANGIOGRAPHY Left 02/08/2023   Procedure: LEFT HEART CATH AND CORONARY ANGIOGRAPHY;  Surgeon: Darron Grass  A, MD;  Location: [facility] INVASIVE CV LAB;  Service: Cardiovascular;  Laterality: Left;   LUMBAR LAMINECTOMY/DECOMPRESSION MICRODISCECTOMY Right 04/29/2020   Procedure: Microdiscectomy - Lumbar Three-Lumbar Four - Lumbar Four-Lumbar Five - right;  Surgeon: Onetha Kuba, MD;  Location: [facility] OR;  Service: Neurosurgery;  Laterality: Right;  Microdiscectomy - Lumbar Three-Lumbar Four - Lumbar Four-Lumbar Five - right   MEDIAL PARTIAL KNEE REPLACEMENT Bilateral    POSTERIOR LUMBAR FUSION  02/2022   L3-4 and L4-5   SHOULDER SURGERY Right    SPINE SURGERY     C3-7 fused   TOTAL HIP ARTHROPLASTY Left 11/25/2015   Procedure: TOTAL HIP ARTHROPLASTY;  Surgeon: Lynwood SHAUNNA Hue, MD;  Location: [facility] ORS;  Service: Orthopedics;  Laterality: Left;   TRIGGER FINGER RELEASE Left     Social History   Tobacco Use   Smoking status: Former    Current packs/day: 0.00    Average  packs/day: 1.5 packs/day for 30.0 years (45.0 ttl pk-yrs)    Types: Cigarettes    Start date: 11/12/1960    Quit date: 11/12/1990    Years since quitting: 33.7   Smokeless tobacco: Never  Vaping Use   Vaping status: Never Used  Substance Use Topics   Alcohol use: Yes    Alcohol/week: 1.0 standard drink of alcohol    Types: 1 Standard drinks or equivalent per week    Comment: OCC GLASS OF BRANDY, maybe 1 drink a week   Drug use: No     Medication list has been reviewed and updated.  Current Meds  Medication Sig   acetaminophen  (TYLENOL ) 500 MG tablet Take 2 tablets (1,000 mg total) by mouth every 6 (six) hours as needed (DO NOT EXCEED 4000 MG DAILY FROM ALL SOURCES). (Patient taking differently: Take 1,000 mg by mouth daily.)   albuterol  (VENTOLIN  HFA) 108 (90 Base) MCG/ACT inhaler Inhale 2 puffs into the lungs every 6 (six) hours as needed for wheezing or shortness of breath.   alum hydroxide-mag trisilicate (GAVISCON ) 80-20 MG CHEW chewable tablet Chew 1 tablet by mouth daily as needed for indigestion or heartburn.   budesonide -formoterol  (SYMBICORT ) 160-4.5 MCG/ACT inhaler Inhale 2 puffs into the lungs in the morning and at bedtime.   carvedilol  (COREG ) 12.5 MG tablet Take 1.5 tablets (18.75 mg total) by mouth 2 (two) times daily.   clonazePAM  (KLONOPIN ) 0.5 MG tablet TAKE 1 TABLET AT BEDTIME AS NEEDED FOR ANXIETY   docusate sodium  (COLACE) 100 MG capsule Take 100 mg by mouth every other day. (Patient taking differently: Take 100 mg by mouth daily as needed.)   empagliflozin  (JARDIANCE ) 10 MG TABS tablet Take 1 tablet (10 mg total) by mouth daily before breakfast.   Evolocumab  (REPATHA  SURECLICK) 140 MG/ML SOAJ Inject 140 mg into the skin every 14 (fourteen) days.   furosemide  (LASIX ) 20 MG tablet Take 1 tablet (20 mg total) by mouth daily as needed (swelling, shortness of breath, gain 3 lbs overnight or 5 lbs in a week).   gabapentin  (NEURONTIN ) 300 MG capsule TAKE 2 CAPSULES AT BEDTIME    meclizine  (ANTIVERT ) 12.5 MG tablet Take 12.5 mg by mouth 3 (three) times daily as needed for dizziness. Dr. Salli   Multiple Vitamin (MULTIVITAMIN) tablet Take 1 tablet by mouth every morning.   omeprazole  (PRILOSEC) 20 MG capsule TAKE 1 CAPSULE TWICE A DAY BEFORE MEALS   potassium chloride  (KLOR-CON  M) 10 MEQ tablet Take 1 tablet (10 mEq total) by mouth as needed (with lasix ).   Probiotic Product (PROBIOTIC DAILY  PO) Take 1 capsule by mouth daily.   sacubitril -valsartan  (ENTRESTO ) 97-103 MG Take 1 tablet by mouth 2 (two) times daily.   Spacer/Aero-Holding Chambers (AEROCHAMBER MV) inhaler Use as instructed   spironolactone  (ALDACTONE ) 25 MG tablet Take 1 tablet (25 mg total) by mouth daily.   tirzepatide  (MOUNJARO ) 12.5 MG/0.5ML Pen Inject 12.5 mg into the skin once a week.   traMADol  (ULTRAM ) 50 MG tablet Take 50 mg by mouth daily.   VITAMIN D  PO Take 5,000 Units by mouth daily.   zolpidem  (AMBIEN ) 5 MG tablet Take 1 tablet (5 mg total) by mouth at bedtime.       08/07/2024   10:25 AM 07/17/2024   11:14 AM 04/14/2024    8:13 AM 10/12/2023    9:18 AM  GAD 7 : Generalized Anxiety Score  Nervous, Anxious, on Edge 0 0 0 0  Control/stop worrying 0 0 0 0  Worry too much - different things 0 0 0 0  Trouble relaxing 0 0 0 0  Restless 0 0 1 0  Easily annoyed or irritable 0 0 0 0  Afraid - awful might happen 0 0 0 0  Total GAD 7 Score 0 0 1 0  Anxiety Difficulty Not difficult at all Not difficult at all Not difficult at all Not difficult at all       08/07/2024   10:25 AM 07/17/2024   11:14 AM 04/14/2024    8:13 AM  Depression screen PHQ 2/9  Decreased Interest 3 0 0  Down, Depressed, Hopeless 0 0 0  PHQ - 2 Score 3 0 0  Altered sleeping 3 0 1  Tired, decreased energy 3 0 1  Change in appetite 0 0 0  Feeling bad or failure about yourself  0 0 0  Trouble concentrating 0 0 0  Moving slowly or fidgety/restless 0 0 0  Suicidal thoughts 0 0 0  PHQ-9 Score 9 0 2  Difficult doing  work/chores Not difficult at all Not difficult at all Not difficult at all    BP Readings from Last 3 Encounters:  08/07/24 122/74  07/17/24 122/76  07/17/24 120/78    Physical Exam Cardiovascular:     Rate and Rhythm: Normal rate and regular rhythm.     Pulses: Normal pulses.  Pulmonary:     Effort: Pulmonary effort is normal. No respiratory distress.     Breath sounds: No wheezing or rhonchi.  Musculoskeletal:        General: Tenderness (right shoulder, right thigh) present. No swelling.     Right shoulder: Tenderness and bony tenderness present. No swelling. Decreased range of motion.     Cervical back: Normal range of motion.     Right hip: Tenderness present.  Lymphadenopathy:     Cervical: No cervical adenopathy.  Neurological:     Mental Status: She is alert.     Cranial Nerves: Cranial nerves 2-12 are intact.     Sensory: Sensation is intact.     Motor: Weakness present.     Coordination: Coordination is intact.     Gait: Gait abnormal.     Deep Tendon Reflexes:     Reflex Scores:      Bicep reflexes are 2+ on the right side and 2+ on the left side.      Patellar reflexes are 1+ on the right side and 2+ on the left side.    Comments: Right grip 4/5;  left grip 5/5      Wt Readings  from Last 3 Encounters:  08/07/24 195 lb (88.5 kg)  07/17/24 195 lb (88.5 kg)  07/17/24 195 lb (88.5 kg)    BP 122/74   Pulse 72   Ht 4' 10 (1.473 m)   Wt 195 lb (88.5 kg)   SpO2 95%   BMI 40.76 kg/m   Assessment and Plan:  Problem List Items Addressed This Visit   None Visit Diagnoses       Diarrhea of presumed infectious origin    -  Primary   now resolving with BRAT diet no anti-diarrheals. will check CBC; no further evaluation as long as she continued to improve   Relevant Orders   CBC with Differential/Platelet     Muscle weakness       suspect electrolyte imbalance from profuse diarrhea KCl 10 meq bid; increase Mg oxide to bid liberalize salt, start  electrolye water of choice BMP and Mg   Relevant Orders   Basic metabolic panel with GFR   Magnesium        No follow-ups on file.    Leita HILARIO Adie, MD [facility] [place]

## 2024-08-07 NOTE — Telephone Encounter (Signed)
 FYI Only or Action Required?: FYI only for provider: 08/07/24.  Patient was last seen in primary care on 07/17/2024 by Justus Leita DEL, MD.  Called Nurse Triage reporting Muscle Pain.  Symptoms began several days ago.  Interventions attempted: Rest, hydration, or home remedies.  Symptoms are: left side improving, right side no improvement.  Triage Disposition: See HCP Within 4 Hours (Or PCP Triage)  Patient/caregiver understands and will follow disposition?: Yes Reason for Disposition  [1] SEVERE pain (e.g., excruciating, unable to do any normal activities) AND [2] not improved 2 hours after pain medicine  Answer Assessment - Initial Assessment Questions C/C is bilateral body pain from neck to leg, left side is improving, right side continues at same severity.   States 10/27-10/29 she had diarrhea. On 10/30, diarrhea was severe overnight and she experienced bilateral body pain. Diarrhea has improved with BRAT diet and left sided pain has improved mildly, but right side continues in severity.  Pain only occurs with movement and is absent at rest. Denies fever. Pain is described as muscular pain. Denies numbness or tingling. Denies headache. Has not checked her BP at home.  Patient has been taking tylenol  with mild improvement. ED precautions reviewed, pt verbalized understanding.   1. ONSET: When did the muscle aches or body pains start?      08/03/24  2. LOCATION: What part of your body is hurting? (e.g., entire body, arms, legs)      From neck to legs, worse on the right  3. SEVERITY: How bad is the pain? (Scale 1-10; or mild, moderate, severe)     Mild left side, severe right side  4. CAUSE: What do you think is causing the pains?     Unknown  5. FEVER: Do you have a fever? If Yes, ask: What is your temperature, how was it measured, and  when did it start?      Denies  Protocols used: Muscle Aches and Body Pain-A-AH   Copied from CRM #[id number]. Topic:  Clinical - Red Word Triage >> Aug 07, 2024  8:12 AM Antony RAMAN wrote: Red Word that prompted transfer to Nurse Triage: muscular pain on right side, diarrhea

## 2024-08-07 NOTE — Patient Instructions (Signed)
 Take 2 potassium tablets daily.  Take 2 Magnesium  tablets daily.  Add a bit of salt to your diet.  Drink GatorAde or Propel for the next few days.

## 2024-08-08 ENCOUNTER — Ambulatory Visit: Payer: Self-pay | Admitting: Internal Medicine

## 2024-08-08 ENCOUNTER — Other Ambulatory Visit: Payer: Self-pay | Admitting: Internal Medicine

## 2024-08-08 DIAGNOSIS — M6281 Muscle weakness (generalized): Secondary | ICD-10-CM

## 2024-08-08 DIAGNOSIS — R944 Abnormal results of kidney function studies: Secondary | ICD-10-CM

## 2024-08-08 LAB — CBC WITH DIFFERENTIAL/PLATELET
Basophils Absolute: 0.1 x10E3/uL (ref 0.0–0.2)
Basos: 1 %
EOS (ABSOLUTE): 3.4 x10E3/uL — ABNORMAL HIGH (ref 0.0–0.4)
Eos: 37 %
Hematocrit: 35.6 % (ref 34.0–46.6)
Hemoglobin: 11.6 g/dL (ref 11.1–15.9)
Immature Grans (Abs): 0 x10E3/uL (ref 0.0–0.1)
Immature Granulocytes: 0 %
Lymphocytes Absolute: 0.9 x10E3/uL (ref 0.7–3.1)
Lymphs: 10 %
MCH: 31.3 pg (ref 26.6–33.0)
MCHC: 32.6 g/dL (ref 31.5–35.7)
MCV: 96 fL (ref 79–97)
Monocytes Absolute: 0.4 x10E3/uL (ref 0.1–0.9)
Monocytes: 4 %
Neutrophils Absolute: 4.4 x10E3/uL (ref 1.4–7.0)
Neutrophils: 48 %
Platelets: 261 x10E3/uL (ref 150–450)
RBC: 3.71 x10E6/uL — ABNORMAL LOW (ref 3.77–5.28)
RDW: 12.4 % (ref 11.7–15.4)
WBC: 9.2 x10E3/uL (ref 3.4–10.8)

## 2024-08-08 LAB — BASIC METABOLIC PANEL WITH GFR
BUN/Creatinine Ratio: 25 (ref 12–28)
BUN: 44 mg/dL — ABNORMAL HIGH (ref 8–27)
CO2: 17 mmol/L — ABNORMAL LOW (ref 20–29)
Calcium: 9.1 mg/dL (ref 8.7–10.3)
Chloride: 101 mmol/L (ref 96–106)
Creatinine, Ser: 1.75 mg/dL — ABNORMAL HIGH (ref 0.57–1.00)
Glucose: 99 mg/dL (ref 70–99)
Potassium: 5.2 mmol/L (ref 3.5–5.2)
Sodium: 135 mmol/L (ref 134–144)
eGFR: 30 mL/min/1.73 — ABNORMAL LOW (ref >59)

## 2024-08-08 LAB — MAGNESIUM: Magnesium: 2.2 mg/dL (ref 1.6–2.3)

## 2024-08-08 NOTE — Progress Notes (Signed)
 Spoke with patient and she will continue BRAT diet and get labs repeated in a week. She verbalized understanding and seen labs on [facility].  JM

## 2024-08-08 NOTE — Progress Notes (Unsigned)
 Date:  08/08/2024   Name:  Katrina Poole   DOB:  07/03/1946   MRN:  [id number]   Chief Complaint: No chief complaint on file.  HPI  Review of Systems   Lab Results  Component Value Date   NA 135 08/07/2024   K 5.2 08/07/2024   CO2 17 (L) 08/07/2024   GLUCOSE 99 08/07/2024   BUN 44 (H) 08/07/2024   CREATININE 1.75 (H) 08/07/2024   CALCIUM  9.1 08/07/2024   EGFR 30 (L) 08/07/2024   GFRNONAA 52 (L) 04/16/2023   Lab Results  Component Value Date   CHOL 120 10/21/2023   HDL 53 10/21/2023   LDLCALC 50 10/21/2023   TRIG 87 10/21/2023   CHOLHDL 2.3 10/21/2023   Lab Results  Component Value Date   TSH 3.050 05/05/2024   Lab Results  Component Value Date   HGBA1C 5.3 05/05/2024   Lab Results  Component Value Date   WBC 9.2 08/07/2024   HGB 11.6 08/07/2024   HCT 35.6 08/07/2024   MCV 96 08/07/2024   PLT 261 08/07/2024   Lab Results  Component Value Date   ALT 15 05/05/2024   AST 17 05/05/2024   ALKPHOS 87 05/05/2024   BILITOT 0.4 05/05/2024   No results found for: MARIEN BOLLS, VD25OH   Patient Active Problem List   Diagnosis Date Noted   Cardiomyopathy (HCC) 02/08/2023   Chronic HFrEF (heart failure with reduced ejection fraction) (HCC) 02/08/2023   CAD (coronary artery disease) 02/08/2023   Allergic pharyngitis 01/14/2023   Left bundle branch block 11/18/2022   Prediabetes 04/22/2022   Spinal stenosis at L4-L5 level 07/21/2021   Heart palpitations 07/01/2021   Mixed hyperlipidemia 07/01/2021   Generalized anxiety disorder 07/01/2021   Body mass index (BMI) 40.0-44.9, adult (HCC) 06/12/2021   Essential hypertension 08/16/2020   Meniere's disease 08/16/2020   Restless leg syndrome 08/16/2020   Primary insomnia 08/16/2020   Mild intermittent asthma without complication 08/16/2020   Diverticulosis of colon 08/16/2020   Gastroesophageal reflux disease without esophagitis 01/24/2020   Osteoarthritis of knee 02/06/2018   S/P total hip  arthroplasty 11/25/2015    Allergies  Allergen Reactions   Azithromycin Hives and Other (See Comments)   Fentanyl  Shortness Of Breath   Sulfa Antibiotics Other (See Comments) and Rash    Hallucinations   Oxycodone  Other (See Comments)    Makes head foggy and interferes with Meniere's syndrome.     Sulfacetamide    Bovine (Beef) Protein-Containing Drug Products Other (See Comments)    Tears up my stomach.    Past Surgical History:  Procedure Laterality Date   ABDOMINAL EXPOSURE N/A 10/09/2022   Procedure: ABDOMINAL EXPOSURE;  Surgeon: Gretta Lonni PARAS, MD;  Location: [facility] OR;  Service: Vascular;  Laterality: N/A;   ANTERIOR CERVICAL DECOMP/DISCECTOMY FUSION N/A 03/16/2018   Procedure: Anterior Cervical Discectomy Fusion - Cervical Three- Cervical Four, Cervical Four- Cervical Five, Cervical Five-Cervical Six, Cervical Six- Cerival Seven;  Surgeon: Onetha Kuba, MD;  Location: [facility] OR;  Service: Neurosurgery;  Laterality: N/A;  Anterior Cervical Discectomy Fusion - Cervical Three- Cervical Four, Cervical Four- Cervical Five, Cervical Five-Cervical Six, Cervical Six- Ceriva   ANTERIOR LUMBAR FUSION N/A 10/09/2022   Procedure: Anteriorr Lumbar Interbody Fusion - Level Five-Sacral One  with posterior cortical screw augmentation and decompressive laminotomy on the right;  Surgeon: Onetha Kuba, MD;  Location: [facility] OR;  Service: Neurosurgery;  Laterality: N/A;   APPENDECTOMY     BREAST SURGERY  Biopsy   BUNIONECTOMY     Left x1, Right x2.   CARDIAC CATHETERIZATION     x2 both normal   CARPAL TUNNEL RELEASE Right    COLONOSCOPY     COLONOSCOPY WITH PROPOFOL  N/A 06/04/2016   Procedure: COLONOSCOPY WITH PROPOFOL ;  Surgeon: Gladis RAYMOND Mariner, MD;  Location: [facility] ENDOSCOPY;  Service: Endoscopy;  Laterality: N/A;   DILATION AND CURETTAGE OF UTERUS     ECTOPIC PREGNANCY SURGERY     ESOPHAGOGASTRODUODENOSCOPY (EGD) WITH PROPOFOL  N/A 06/04/2016   Procedure: ESOPHAGOGASTRODUODENOSCOPY (EGD) WITH  PROPOFOL ;  Surgeon: Gladis RAYMOND Mariner, MD;  Location: [facility] ENDOSCOPY;  Service: Endoscopy;  Laterality: N/A;   exploratory laparoscopy     infertility surgery   EYE SURGERY     Catarac removal   FRACTURE SURGERY     HALLUX VALGUS CORRECTION Bilateral    HARDWARE REMOVAL Left 11/05/2023   Procedure: HARDWARE REMOVAL;  Surgeon: Lennie Barter, DPM;  Location: [facility] ORS;  Service: Orthopedics/Podiatry;  Laterality: Left;   JOINT REPLACEMENT Bilateral    partial knee   KNEE ARTHROPLASTY Left 01/27/2021   Procedure: CONVERSION OF LEFT PARTIAL KNEE TO LEFT TOTAL KNEE.;  Surgeon: Mardee Lynwood SQUIBB, MD;  Location: [facility] ORS;  Service: Orthopedics;  Laterality: Left;   KNEE ARTHROSCOPY Left 07/19/2015   Procedure: Arthroscopic limited synovectomy along with lateral compartment and retropatellar chondral debridement plus removal of several small loose bodies;  Surgeon: Helayne Glenn, MD;  Location: [facility];  Service: Orthopedics;  Laterality: Left;  1ST CASE PER CECE   LAMINECTOMY WITH POSTERIOR LATERAL ARTHRODESIS LEVEL 2 N/A 10/09/2022   Procedure: LAMINECTOMY WITH POSTERIOR LATERAL ARTHRODESIS LEVEL TWO;  Surgeon: Onetha Kuba, MD;  Location: [facility] OR;  Service: Neurosurgery;  Laterality: N/A;   LEFT HEART CATH AND CORONARY ANGIOGRAPHY Left 02/08/2023   Procedure: LEFT HEART CATH AND CORONARY ANGIOGRAPHY;  Surgeon: Darron Deatrice LABOR, MD;  Location: [facility] INVASIVE CV LAB;  Service: Cardiovascular;  Laterality: Left;   LUMBAR LAMINECTOMY/DECOMPRESSION MICRODISCECTOMY Right 04/29/2020   Procedure: Microdiscectomy - Lumbar Three-Lumbar Four - Lumbar Four-Lumbar Five - right;  Surgeon: Onetha Kuba, MD;  Location: [facility] OR;  Service: Neurosurgery;  Laterality: Right;  Microdiscectomy - Lumbar Three-Lumbar Four - Lumbar Four-Lumbar Five - right   MEDIAL PARTIAL KNEE REPLACEMENT Bilateral    POSTERIOR LUMBAR FUSION  02/2022   L3-4 and L4-5   SHOULDER SURGERY Right    SPINE SURGERY     C3-7 fused   TOTAL  HIP ARTHROPLASTY Left 11/25/2015   Procedure: TOTAL HIP ARTHROPLASTY;  Surgeon: Lynwood SQUIBB Mardee, MD;  Location: [facility] ORS;  Service: Orthopedics;  Laterality: Left;   TRIGGER FINGER RELEASE Left     Social History   Tobacco Use   Smoking status: Former    Current packs/day: 0.00    Average packs/day: 1.5 packs/day for 30.0 years (45.0 ttl pk-yrs)    Types: Cigarettes    Start date: 11/12/1960    Quit date: 11/12/1990    Years since quitting: 33.7   Smokeless tobacco: Never  Vaping Use   Vaping status: Never Used  Substance Use Topics   Alcohol use: Yes    Alcohol/week: 1.0 standard drink of alcohol    Types: 1 Standard drinks or equivalent per week    Comment: OCC GLASS OF BRANDY, maybe 1 drink a week   Drug use: No     Medication list has been reviewed and updated.  No outpatient medications have been marked as taking for the 08/08/24 encounter (Orders  Only) with Justus Leita DEL, MD.       08/07/2024   10:25 AM 07/17/2024   11:14 AM 04/14/2024    8:13 AM 10/12/2023    9:18 AM  GAD 7 : Generalized Anxiety Score  Nervous, Anxious, on Edge 0 0 0 0  Control/stop worrying 0 0 0 0  Worry too much - different things 0 0 0 0  Trouble relaxing 0 0 0 0  Restless 0 0 1 0  Easily annoyed or irritable 0 0 0 0  Afraid - awful might happen 0 0 0 0  Total GAD 7 Score 0 0 1 0  Anxiety Difficulty Not difficult at all Not difficult at all Not difficult at all Not difficult at all       08/07/2024   10:25 AM 07/17/2024   11:14 AM 04/14/2024    8:13 AM  Depression screen PHQ 2/9  Decreased Interest 3 0 0  Down, Depressed, Hopeless 0 0 0  PHQ - 2 Score 3 0 0  Altered sleeping 3 0 1  Tired, decreased energy 3 0 1  Change in appetite 0 0 0  Feeling bad or failure about yourself  0 0 0  Trouble concentrating 0 0 0  Moving slowly or fidgety/restless 0 0 0  Suicidal thoughts 0 0 0  PHQ-9 Score 9 0 2  Difficult doing work/chores Not difficult at all Not difficult at all Not difficult at  all    BP Readings from Last 3 Encounters:  08/07/24 122/74  07/17/24 122/76  07/17/24 120/78    Physical Exam  Wt Readings from Last 3 Encounters:  08/07/24 195 lb (88.5 kg)  07/17/24 195 lb (88.5 kg)  07/17/24 195 lb (88.5 kg)    There were no vitals taken for this visit.  Assessment and Plan:  Problem List Items Addressed This Visit   None   No follow-ups on file.    Leita HILARIO Justus, MD [facility] [place]

## 2024-08-13 ENCOUNTER — Encounter: Payer: Self-pay | Admitting: Internal Medicine

## 2024-08-14 ENCOUNTER — Other Ambulatory Visit (HOSPITAL_COMMUNITY): Payer: Self-pay

## 2024-08-14 ENCOUNTER — Other Ambulatory Visit: Payer: Self-pay

## 2024-08-14 ENCOUNTER — Ambulatory Visit: Admitting: Internal Medicine

## 2024-08-14 DIAGNOSIS — R944 Abnormal results of kidney function studies: Secondary | ICD-10-CM

## 2024-08-14 DIAGNOSIS — R197 Diarrhea, unspecified: Secondary | ICD-10-CM

## 2024-08-15 ENCOUNTER — Ambulatory Visit: Payer: Self-pay | Admitting: Internal Medicine

## 2024-08-15 LAB — BASIC METABOLIC PANEL WITH GFR
BUN/Creatinine Ratio: 32 — ABNORMAL HIGH (ref 12–28)
BUN: 49 mg/dL — ABNORMAL HIGH (ref 8–27)
CO2: 16 mmol/L — ABNORMAL LOW (ref 20–29)
Calcium: 9.1 mg/dL (ref 8.7–10.3)
Chloride: 100 mmol/L (ref 96–106)
Creatinine, Ser: 1.55 mg/dL — ABNORMAL HIGH (ref 0.57–1.00)
Glucose: 92 mg/dL (ref 70–99)
Potassium: 5.4 mmol/L — ABNORMAL HIGH (ref 3.5–5.2)
Sodium: 135 mmol/L (ref 134–144)
eGFR: 35 mL/min/1.73 — ABNORMAL LOW (ref >59)

## 2024-08-16 LAB — GI PROFILE, STOOL, PCR

## 2024-08-20 ENCOUNTER — Encounter: Payer: Self-pay | Admitting: Internal Medicine

## 2024-08-21 ENCOUNTER — Other Ambulatory Visit: Payer: Self-pay | Admitting: Internal Medicine

## 2024-08-21 ENCOUNTER — Ambulatory Visit: Payer: Self-pay

## 2024-08-21 DIAGNOSIS — K529 Noninfective gastroenteritis and colitis, unspecified: Secondary | ICD-10-CM

## 2024-08-21 NOTE — Telephone Encounter (Signed)
 FYI  KP

## 2024-08-21 NOTE — Progress Notes (Unsigned)
 Date:  08/21/2024   Name:  Katrina Poole   DOB:  November 18, 1945   MRN:  [id number]   Chief Complaint: No chief complaint on file.  HPI  Review of Systems   Lab Results  Component Value Date   NA 135 08/14/2024   K 5.4 (H) 08/14/2024   CO2 16 (L) 08/14/2024   GLUCOSE 92 08/14/2024   BUN 49 (H) 08/14/2024   CREATININE 1.55 (H) 08/14/2024   CALCIUM  9.1 08/14/2024   EGFR 35 (L) 08/14/2024   GFRNONAA 52 (L) 04/16/2023   Lab Results  Component Value Date   CHOL 120 10/21/2023   HDL 53 10/21/2023   LDLCALC 50 10/21/2023   TRIG 87 10/21/2023   CHOLHDL 2.3 10/21/2023   Lab Results  Component Value Date   TSH 3.050 05/05/2024   Lab Results  Component Value Date   HGBA1C 5.3 05/05/2024   Lab Results  Component Value Date   WBC 9.2 08/07/2024   HGB 11.6 08/07/2024   HCT 35.6 08/07/2024   MCV 96 08/07/2024   PLT 261 08/07/2024   Lab Results  Component Value Date   ALT 15 05/05/2024   AST 17 05/05/2024   ALKPHOS 87 05/05/2024   BILITOT 0.4 05/05/2024   No results found for: MARIEN BOLLS, VD25OH   Patient Active Problem List   Diagnosis Date Noted   Cardiomyopathy (HCC) 02/08/2023   Chronic HFrEF (heart failure with reduced ejection fraction) (HCC) 02/08/2023   CAD (coronary artery disease) 02/08/2023   Allergic pharyngitis 01/14/2023   Left bundle branch block 11/18/2022   Prediabetes 04/22/2022   Spinal stenosis at L4-L5 level 07/21/2021   Heart palpitations 07/01/2021   Mixed hyperlipidemia 07/01/2021   Generalized anxiety disorder 07/01/2021   Body mass index (BMI) 40.0-44.9, adult (HCC) 06/12/2021   Essential hypertension 08/16/2020   Meniere's disease 08/16/2020   Restless leg syndrome 08/16/2020   Primary insomnia 08/16/2020   Mild intermittent asthma without complication 08/16/2020   Diverticulosis of colon 08/16/2020   Gastroesophageal reflux disease without esophagitis 01/24/2020   Osteoarthritis of knee 02/06/2018   S/P total  hip arthroplasty 11/25/2015    Allergies  Allergen Reactions   Azithromycin Hives and Other (See Comments)   Fentanyl  Shortness Of Breath   Sulfa Antibiotics Other (See Comments) and Rash    Hallucinations   Oxycodone  Other (See Comments)    Makes head foggy and interferes with Meniere's syndrome.     Sulfacetamide    Bovine (Beef) Protein-Containing Drug Products Other (See Comments)    Tears up my stomach.    Past Surgical History:  Procedure Laterality Date   ABDOMINAL EXPOSURE N/A 10/09/2022   Procedure: ABDOMINAL EXPOSURE;  Surgeon: Gretta Lonni PARAS, MD;  Location: [facility] OR;  Service: Vascular;  Laterality: N/A;   ANTERIOR CERVICAL DECOMP/DISCECTOMY FUSION N/A 03/16/2018   Procedure: Anterior Cervical Discectomy Fusion - Cervical Three- Cervical Four, Cervical Four- Cervical Five, Cervical Five-Cervical Six, Cervical Six- Cerival Seven;  Surgeon: Onetha Kuba, MD;  Location: [facility] OR;  Service: Neurosurgery;  Laterality: N/A;  Anterior Cervical Discectomy Fusion - Cervical Three- Cervical Four, Cervical Four- Cervical Five, Cervical Five-Cervical Six, Cervical Six- Ceriva   ANTERIOR LUMBAR FUSION N/A 10/09/2022   Procedure: Anteriorr Lumbar Interbody Fusion - Level Five-Sacral One  with posterior cortical screw augmentation and decompressive laminotomy on the right;  Surgeon: Onetha Kuba, MD;  Location: [facility] OR;  Service: Neurosurgery;  Laterality: N/A;   APPENDECTOMY     BREAST SURGERY  Biopsy   BUNIONECTOMY     Left x1, Right x2.   CARDIAC CATHETERIZATION     x2 both normal   CARPAL TUNNEL RELEASE Right    COLONOSCOPY     COLONOSCOPY WITH PROPOFOL  N/A 06/04/2016   Procedure: COLONOSCOPY WITH PROPOFOL ;  Surgeon: Gladis RAYMOND Mariner, MD;  Location: [facility] ENDOSCOPY;  Service: Endoscopy;  Laterality: N/A;   DILATION AND CURETTAGE OF UTERUS     ECTOPIC PREGNANCY SURGERY     ESOPHAGOGASTRODUODENOSCOPY (EGD) WITH PROPOFOL  N/A 06/04/2016   Procedure: ESOPHAGOGASTRODUODENOSCOPY (EGD)  WITH PROPOFOL ;  Surgeon: Gladis RAYMOND Mariner, MD;  Location: [facility] ENDOSCOPY;  Service: Endoscopy;  Laterality: N/A;   exploratory laparoscopy     infertility surgery   EYE SURGERY     Catarac removal   FRACTURE SURGERY     HALLUX VALGUS CORRECTION Bilateral    HARDWARE REMOVAL Left 11/05/2023   Procedure: HARDWARE REMOVAL;  Surgeon: Lennie Barter, DPM;  Location: [facility] ORS;  Service: Orthopedics/Podiatry;  Laterality: Left;   JOINT REPLACEMENT Bilateral    partial knee   KNEE ARTHROPLASTY Left 01/27/2021   Procedure: CONVERSION OF LEFT PARTIAL KNEE TO LEFT TOTAL KNEE.;  Surgeon: Mardee Lynwood SQUIBB, MD;  Location: [facility] ORS;  Service: Orthopedics;  Laterality: Left;   KNEE ARTHROSCOPY Left 07/19/2015   Procedure: Arthroscopic limited synovectomy along with lateral compartment and retropatellar chondral debridement plus removal of several small loose bodies;  Surgeon: Helayne Glenn, MD;  Location: [facility];  Service: Orthopedics;  Laterality: Left;  1ST CASE PER CECE   LAMINECTOMY WITH POSTERIOR LATERAL ARTHRODESIS LEVEL 2 N/A 10/09/2022   Procedure: LAMINECTOMY WITH POSTERIOR LATERAL ARTHRODESIS LEVEL TWO;  Surgeon: Onetha Kuba, MD;  Location: [facility] OR;  Service: Neurosurgery;  Laterality: N/A;   LEFT HEART CATH AND CORONARY ANGIOGRAPHY Left 02/08/2023   Procedure: LEFT HEART CATH AND CORONARY ANGIOGRAPHY;  Surgeon: Darron Deatrice LABOR, MD;  Location: [facility] INVASIVE CV LAB;  Service: Cardiovascular;  Laterality: Left;   LUMBAR LAMINECTOMY/DECOMPRESSION MICRODISCECTOMY Right 04/29/2020   Procedure: Microdiscectomy - Lumbar Three-Lumbar Four - Lumbar Four-Lumbar Five - right;  Surgeon: Onetha Kuba, MD;  Location: [facility] OR;  Service: Neurosurgery;  Laterality: Right;  Microdiscectomy - Lumbar Three-Lumbar Four - Lumbar Four-Lumbar Five - right   MEDIAL PARTIAL KNEE REPLACEMENT Bilateral    POSTERIOR LUMBAR FUSION  02/2022   L3-4 and L4-5   SHOULDER SURGERY Right    SPINE SURGERY     C3-7 fused    TOTAL HIP ARTHROPLASTY Left 11/25/2015   Procedure: TOTAL HIP ARTHROPLASTY;  Surgeon: Lynwood SQUIBB Mardee, MD;  Location: [facility] ORS;  Service: Orthopedics;  Laterality: Left;   TRIGGER FINGER RELEASE Left     Social History   Tobacco Use   Smoking status: Former    Current packs/day: 0.00    Average packs/day: 1.5 packs/day for 30.0 years (45.0 ttl pk-yrs)    Types: Cigarettes    Start date: 11/12/1960    Quit date: 11/12/1990    Years since quitting: 33.7   Smokeless tobacco: Never  Vaping Use   Vaping status: Never Used  Substance Use Topics   Alcohol use: Yes    Alcohol/week: 1.0 standard drink of alcohol    Types: 1 Standard drinks or equivalent per week    Comment: OCC GLASS OF BRANDY, maybe 1 drink a week   Drug use: No     Medication list has been reviewed and updated.  No outpatient medications have been marked as taking for the 08/21/24 encounter (Orders  Only) with Justus Leita DEL, MD.       08/07/2024   10:25 AM 07/17/2024   11:14 AM 04/14/2024    8:13 AM 10/12/2023    9:18 AM  GAD 7 : Generalized Anxiety Score  Nervous, Anxious, on Edge 0 0 0 0  Control/stop worrying 0 0 0 0  Worry too much - different things 0 0 0 0  Trouble relaxing 0 0 0 0  Restless 0 0 1 0  Easily annoyed or irritable 0 0 0 0  Afraid - awful might happen 0 0 0 0  Total GAD 7 Score 0 0 1 0  Anxiety Difficulty Not difficult at all Not difficult at all Not difficult at all Not difficult at all       08/07/2024   10:25 AM 07/17/2024   11:14 AM 04/14/2024    8:13 AM  Depression screen PHQ 2/9  Decreased Interest 3 0 0  Down, Depressed, Hopeless 0 0 0  PHQ - 2 Score 3 0 0  Altered sleeping 3 0 1  Tired, decreased energy 3 0 1  Change in appetite 0 0 0  Feeling bad or failure about yourself  0 0 0  Trouble concentrating 0 0 0  Moving slowly or fidgety/restless 0 0 0  Suicidal thoughts 0 0 0  PHQ-9 Score 9  0  2   Difficult doing work/chores Not difficult at all Not difficult at all Not  difficult at all     Data saved with a previous flowsheet row definition    BP Readings from Last 3 Encounters:  08/07/24 122/74  07/17/24 122/76  07/17/24 120/78    Physical Exam  Wt Readings from Last 3 Encounters:  08/07/24 195 lb (88.5 kg)  07/17/24 195 lb (88.5 kg)  07/17/24 195 lb (88.5 kg)    There were no vitals taken for this visit.  Assessment and Plan:  Problem List Items Addressed This Visit   None   No follow-ups on file.    Leita HILARIO Justus, MD [facility] [place]

## 2024-08-21 NOTE — Telephone Encounter (Signed)
Duplicate   Pt sent message on Mychart.  KP

## 2024-08-21 NOTE — Telephone Encounter (Signed)
 Please review.  KP

## 2024-08-21 NOTE — Telephone Encounter (Signed)
 FYI Only or Action Required?: Action required by provider: referral request and clinical question for provider.  Patient was last seen in primary care on 08/07/2024 by Justus Leita DEL, MD.  Called Nurse Triage reporting Diarrhea.  Symptoms began several weeks ago.  Interventions attempted: OTC medications: Imodium, Rest, hydration, or home remedies, and Other: OV on 08/07/24.  Symptoms are: unchanged.  Triage Disposition: Call PCP When Office is Open (overriding See Physician Within 24 Hours)  Patient/caregiver understands and will follow disposition?: Yes                                 Reason for Triage: Patient called in stating she is still having diarrhea. I looked into her chart and saw that she reached out to her PCP today requesting a Gastroenterologist Referral.  Patient still wants assistance with her current symptoms.  Reason for Disposition  [1] MODERATE diarrhea (e.g., 4-6 times / day more than normal) AND [2] present > 48 hours (2 days)  Answer Assessment - Initial Assessment Questions 1. DIARRHEA SEVERITY: How bad is the diarrhea? How many more stools have you had in the past 24 hours than normal?      Moderate to severe 2. ONSET: When did the diarrhea begin?      Several weeks ago, OV on 08/07/24 3. STOOL DESCRIPTION:  How loose or watery is the diarrhea? What is the stool color? Is there any blood or mucous in the stool?     Water, light brown, denies blood in stool 4. VOMITING: Are you also vomiting? If Yes, ask: How many times in the past 24 hours?      Denies  5. ABDOMEN PAIN: Are you having any abdomen pain? If Yes, ask: What does it feel like? (e.g., crampy, dull, intermittent, constant)      States she experiences discomfort directly under her breast line whenever she eats 6. ABDOMEN PAIN SEVERITY: If present, ask: How bad is the pain?  (e.g., Scale 1-10; mild, moderate, or severe)     N/A 7. ORAL INTAKE:  If vomiting, Have you been able to drink liquids? How much liquids have you had in the past 24 hours?     N/A 8. HYDRATION: Any signs of dehydration? (e.g., dry mouth [not just dry lips], too weak to stand, dizziness, new weight loss) When did you last urinate?     Denies dizziness, denies feeling too weak to stand 9. EXPOSURE: Have you traveled to a foreign country recently? Have you been exposed to anyone with diarrhea? Could you have eaten any food that was spoiled?     Unsure, states she ate someone's chicken and rice casserole at a retreat a few weeks ago and symptoms started shortly after  10. ANTIBIOTIC USE: Are you taking antibiotics now or have you taken antibiotics in the past 2 months?     N/A 11. OTHER SYMPTOMS: Do you have any other symptoms? (e.g., fever, blood in stool)     Low energy  12. PREGNANCY: Is there any chance you are pregnant? When was your last menstrual period?     N/A    Patient was evaluated at an OV on 08/07/24 and is requesting further evaluation by GI. Patient would like an internal referral to GI. Patient would like additional advice from provider on what else she can do or take for symptoms. Please advise.  Protocols used: [facility]

## 2024-08-22 ENCOUNTER — Other Ambulatory Visit: Payer: Self-pay | Admitting: Internal Medicine

## 2024-08-23 ENCOUNTER — Encounter: Payer: Self-pay | Admitting: Family Medicine

## 2024-08-23 ENCOUNTER — Ambulatory Visit (INDEPENDENT_AMBULATORY_CARE_PROVIDER_SITE_OTHER): Admitting: Family Medicine

## 2024-08-23 ENCOUNTER — Other Ambulatory Visit: Payer: Self-pay

## 2024-08-23 VITALS — BP 113/73 | HR 60 | Temp 98.3°F | Ht <= 58 in | Wt 190.4 lb

## 2024-08-23 DIAGNOSIS — K219 Gastro-esophageal reflux disease without esophagitis: Secondary | ICD-10-CM

## 2024-08-23 DIAGNOSIS — R140 Abdominal distension (gaseous): Secondary | ICD-10-CM | POA: Diagnosis not present

## 2024-08-23 DIAGNOSIS — R197 Diarrhea, unspecified: Secondary | ICD-10-CM | POA: Diagnosis not present

## 2024-08-23 MED ORDER — HYDROCORTISONE (PERIANAL) 2.5 % EX CREA: 1.0000 | TOPICAL_CREAM | Freq: Two times a day (BID) | CUTANEOUS | 1 refills | Status: AC

## 2024-08-23 MED ORDER — PANTOPRAZOLE SODIUM 40 MG PO TBEC: 40.0000 mg | DELAYED_RELEASE_TABLET | Freq: Every day | ORAL | 1 refills | Status: AC

## 2024-08-23 MED ORDER — RIFAXIMIN 550 MG PO TABS
550.0000 mg | ORAL_TABLET | Freq: Three times a day (TID) | ORAL | 0 refills | Status: AC
  Filled 2024-08-23 (×2): qty 30, 10d supply, fill #0

## 2024-08-23 NOTE — Progress Notes (Signed)
 08/23/2024 Katrina Poole [id number] [birth year]  Gastroenterology Office Note    Referring Provider: Justus Leita DEL, MD Primary Care Physician:  Justus Leita DEL, MD  Primary GI Provider: Jinny Carmine, MD    Chief Complaint   Chief Complaint  Patient presents with   New Patient (Initial Visit)    Diarrhea since October constant -labs done PCP- taking Xifaxan x 3 days-no change-also feels like food just sits and doesn't go down as it should-also noted to have blood on tissue when wiping-not in the stool that she can see     History of Present Illness   Katrina Poole is a 78 y.o. female with PMHX of GERD , presenting today at the request of Justus Leita DEL, MD due to chronic diarrhea.  Patient reports that on 07/30/2024 she started having diarrhea.  She would have 2-3 episodes in the morning and then 1 night it was nonstop and she was very weak.  For the last 2 weeks that has been intermittent throughout the day, and then it will hit where she is in the bathroom for 2-3 hours. She reports her rectum is sore from wiping so much, she does have history of hemorrhoids.  She did see a small amount of blood when she wiped when she has the diarrhea nonstop.  She reports that it is not related to what she is eating.  Sometimes she does feel bloated and gassy, with some acid reflux.  Denies abdominal pain.  Her PCP started her on Xifaxan with 4 days worth of samples on Monday, so far there has been no improvement.  She was taking Imodium 3 times a day and she stopped once she started on the antibiotic.  Denies nausea, vomiting, fever, chills, melena.  She reports her appetite is still good.   Patient seen by PCP on 08/07/2024 with complaints of new onset diarrhea. Stool pathogen panel negative.  06/04/2016 Colonoscopy - One 5 mm polyp in the rectum, removed with a cold snare. Resected and retrieved.  - Diverticulosis in the sigmoid colon and in the distal descending colon.  - A  single colonic angioectasia. Treated with argon beam coagulation.   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   Hypertension    controlled on meds   Lumbago of lumbar region with sciatica 06/12/2021   Meniere syndrome    hearing deaf right ear   Neuromuscular disorder (HCC)    finger tips go numb, neck vertebrae   Restless leg syndrome    Spondylolisthesis, lumbosacral region    Status post arthroscopic surgery of left knee 08/15/2015   Vertigo    Wears dentures    full dentures on top, partial bottom    Past Surgical History:  Procedure Laterality Date   ABDOMINAL EXPOSURE N/A 10/09/2022   Procedure: ABDOMINAL EXPOSURE;  Surgeon: Gretta Lonni PARAS, MD;  Location: [facility] OR;  Service: Vascular;  Laterality: N/A;   ANTERIOR CERVICAL DECOMP/DISCECTOMY FUSION N/A 03/16/2018   Procedure: Anterior Cervical Discectomy  Fusion - Cervical Three- Cervical Four, Cervical Four- Cervical Five, Cervical Five-Cervical Six, Cervical Six- Cerival Seven;  Surgeon: Onetha Kuba, MD;  Location: [facility] OR;  Service: Neurosurgery;  Laterality: N/A;  Anterior Cervical Discectomy Fusion - Cervical Three- Cervical Four, Cervical Four- Cervical Five, Cervical Five-Cervical Six, Cervical Six- Ceriva   ANTERIOR LUMBAR FUSION N/A 10/09/2022   Procedure: Anteriorr Lumbar Interbody Fusion - Level Five-Sacral One  with posterior cortical screw augmentation and decompressive laminotomy on the right;  Surgeon: Onetha Kuba, MD;  Location: [facility] OR;  Service: Neurosurgery;  Laterality: N/A;    APPENDECTOMY     BREAST SURGERY     Biopsy   BUNIONECTOMY     Left x1, Right x2.   CARDIAC CATHETERIZATION     x2 both normal   CARPAL TUNNEL RELEASE Right    COLONOSCOPY     COLONOSCOPY WITH PROPOFOL  N/A 06/04/2016   Procedure: COLONOSCOPY WITH PROPOFOL ;  Surgeon: Gladis RAYMOND Mariner, MD;  Location: [facility] ENDOSCOPY;  Service: Endoscopy;  Laterality: N/A;   DILATION AND CURETTAGE OF UTERUS     ECTOPIC PREGNANCY SURGERY     ESOPHAGOGASTRODUODENOSCOPY (EGD) WITH PROPOFOL  N/A 06/04/2016   Procedure: ESOPHAGOGASTRODUODENOSCOPY (EGD) WITH PROPOFOL ;  Surgeon: Gladis RAYMOND Mariner, MD;  Location: [facility] ENDOSCOPY;  Service: Endoscopy;  Laterality: N/A;   exploratory laparoscopy     infertility surgery   EYE SURGERY     Catarac removal   FRACTURE SURGERY     HALLUX VALGUS CORRECTION Bilateral    HARDWARE REMOVAL Left 11/05/2023   Procedure: HARDWARE REMOVAL;  Surgeon: Lennie Barter, DPM;  Location: [facility] ORS;  Service: Orthopedics/Podiatry;  Laterality: Left;   JOINT REPLACEMENT Bilateral    partial knee   KNEE ARTHROPLASTY Left 01/27/2021   Procedure: CONVERSION OF LEFT PARTIAL KNEE TO LEFT TOTAL KNEE.;  Surgeon: Mardee Lynwood SQUIBB, MD;  Location: [facility] ORS;  Service: Orthopedics;  Laterality: Left;   KNEE ARTHROSCOPY Left 07/19/2015   Procedure: Arthroscopic limited synovectomy along with lateral compartment and retropatellar chondral debridement plus removal of several small loose bodies;  Surgeon: Helayne Glenn, MD;  [facility];  Service: Orthopedics;  Laterality: Left;  1ST CASE PER CECE   LAMINECTOMY WITH POSTERIOR LATERAL ARTHRODESIS LEVEL 2 N/A 10/09/2022   Procedure: LAMINECTOMY WITH POSTERIOR LATERAL ARTHRODESIS LEVEL TWO;  Surgeon: Onetha Kuba, MD;  Location: [facility] OR;  Service: Neurosurgery;  Laterality: N/A;   LEFT HEART CATH AND CORONARY ANGIOGRAPHY Left 02/08/2023   Procedure: LEFT HEART CATH AND CORONARY ANGIOGRAPHY;  Surgeon: Darron Deatrice LABOR, MD;  Location: [facility]  INVASIVE CV LAB;  Service: Cardiovascular;  Laterality: Left;   LUMBAR LAMINECTOMY/DECOMPRESSION MICRODISCECTOMY Right 04/29/2020   Procedure: Microdiscectomy - Lumbar Three-Lumbar Four - Lumbar Four-Lumbar Five - right;  Surgeon: Onetha Kuba, MD;  Location: [facility] OR;  Service: Neurosurgery;  Laterality: Right;  Microdiscectomy - Lumbar Three-Lumbar Four - Lumbar Four-Lumbar Five - right   MEDIAL PARTIAL KNEE REPLACEMENT Bilateral    POSTERIOR LUMBAR FUSION  02/2022   L3-4 and L4-5   SHOULDER SURGERY Right    SPINE SURGERY     C3-7 fused   TOTAL HIP ARTHROPLASTY Left 11/25/2015   Procedure: TOTAL HIP ARTHROPLASTY;  Surgeon: Lynwood SQUIBB Mardee, MD;  Location: [facility] ORS;  Service: Orthopedics;  Laterality: Left;   TRIGGER FINGER RELEASE Left     Current Outpatient Medications  Medication Sig Dispense Refill   acetaminophen  (TYLENOL ) 500 MG tablet Take 2 tablets (1,000 mg total) by mouth every 6 (six) hours as needed (DO  NOT EXCEED 4000 MG DAILY FROM ALL SOURCES). (Patient taking differently: Take 1,000 mg by mouth daily.) 30 tablet 2   albuterol  (VENTOLIN  HFA) 108 (90 Base) MCG/ACT inhaler Inhale 2 puffs into the lungs every 6 (six) hours as needed for wheezing or shortness of breath. 18 g 1   alum hydroxide-mag trisilicate (GAVISCON ) 80-20 MG CHEW chewable tablet Chew 1 tablet by mouth daily as needed for indigestion or heartburn.     budesonide -formoterol  (SYMBICORT ) 160-4.5 MCG/ACT inhaler Inhale 2 puffs into the lungs in the morning and at bedtime. 1 each 12   carvedilol  (COREG ) 12.5 MG tablet Take 1.5 tablets (18.75 mg total) by mouth 2 (two) times daily. 270 tablet 3   clonazePAM  (KLONOPIN ) 0.5 MG tablet TAKE 1 TABLET AT BEDTIME AS NEEDED FOR ANXIETY 30 tablet 3   docusate sodium  (COLACE) 100 MG capsule Take 100 mg by mouth every other day. (Patient taking differently: Take 100 mg by mouth daily as needed.)     empagliflozin  (JARDIANCE ) 10 MG TABS tablet Take 1 tablet (10 mg total) by mouth daily  before breakfast. 30 tablet 11   Evolocumab  (REPATHA  SURECLICK) 140 MG/ML SOAJ Inject 140 mg into the skin every 14 (fourteen) days. 2 mL 3   furosemide  (LASIX ) 20 MG tablet Take 1 tablet (20 mg total) by mouth daily as needed (swelling, shortness of breath, gain 3 lbs overnight or 5 lbs in a week). 30 tablet 4   gabapentin  (NEURONTIN ) 300 MG capsule TAKE 2 CAPSULES AT BEDTIME 180 capsule 1   meclizine  (ANTIVERT ) 12.5 MG tablet Take 12.5 mg by mouth 3 (three) times daily as needed for dizziness. Dr. Salli     Multiple Vitamin (MULTIVITAMIN) tablet Take 1 tablet by mouth every morning.     omeprazole  (PRILOSEC) 20 MG capsule TAKE 1 CAPSULE TWICE A DAY BEFORE MEALS 180 capsule 1   potassium chloride  (KLOR-CON  M) 10 MEQ tablet Take 1 tablet (10 mEq total) by mouth as needed (with lasix ). 30 tablet 5   Probiotic Product (PROBIOTIC DAILY PO) Take 1 capsule by mouth daily.     rifaximin  (XIFAXAN ) 550 MG TABS tablet Take 550 mg by mouth.     sacubitril -valsartan  (ENTRESTO ) 97-103 MG Take 1 tablet by mouth 2 (two) times daily. 180 tablet 3   Spacer/Aero-Holding Chambers (AEROCHAMBER MV) inhaler Use as instructed 1 each 0   spironolactone  (ALDACTONE ) 25 MG tablet Take 1 tablet (25 mg total) by mouth daily. 90 tablet 3   traMADol  (ULTRAM ) 50 MG tablet Take 50 mg by mouth daily.     VITAMIN D  PO Take 5,000 Units by mouth daily.     zolpidem  (AMBIEN ) 5 MG tablet Take 1 tablet (5 mg total) by mouth at bedtime. 90 tablet 0   No current facility-administered medications for this visit.    Allergies as of 08/23/2024 - Review Complete 08/23/2024  Allergen Reaction Noted   Azithromycin Hives and Other (See Comments) 03/06/2014   Fentanyl  Shortness Of Breath 10/28/2023   Sulfa antibiotics Other (See Comments) and Rash 03/06/2014   Oxycodone  Other (See Comments) 04/22/2020   Sulfacetamide  09/17/2022   Bovine (beef) protein-containing drug products Other (See Comments) 11/13/2015    Family History  Problem  Relation Age of Onset   Heart failure Mother    COPD Mother    Hypertension Mother    Heart disease Mother    Cancer Father    Heart attack Brother    Heart attack Brother 25   Heart attack Brother  Heart disease Brother    Heart disease Brother    Heart attack Brother    Heart disease Brother     Social History   Socioeconomic History   Marital status: Married    Spouse name: Francis Coventry   Number of children: 1   Years of education: Not on file   Highest education level: Not on file  Occupational History   Occupation: Part time  Tobacco Use   Smoking status: Former    Current packs/day: 0.00    Average packs/day: 1.5 packs/day for 30.0 years (45.0 ttl pk-yrs)    Types: Cigarettes    Start date: 11/12/1960    Quit date: 11/12/1990    Years since quitting: 33.8   Smokeless tobacco: Never  Vaping Use   Vaping status: Never Used  Substance and Sexual Activity   Alcohol use: Yes    Alcohol/week: 1.0 standard drink of alcohol    Types: 1 Standard drinks or equivalent per week    Comment: OCC [place], maybe 1 drink a week   Drug use: No   Sexual activity: Not Currently  Other Topics Concern   Not on file  Social History Narrative   Not on file   Social Drivers of Health   Financial Resource Strain: Low Risk  (12/21/2023)   Received from [facility]   Overall Financial Resource Strain (CARDIA)    Difficulty of Paying Living Expenses: Not hard at all  Food Insecurity: No Food Insecurity (12/21/2023)   Received from [facility]   Hunger Vital Sign    Within the past 12 months, you worried that your food would run out before you got the money to buy more.: Never true    Within the past 12 months, the food you bought just didn't last and you didn't have money to get more.: Never true  Transportation Needs: No Transportation Needs (12/21/2023)   Received from [facility] - Transportation    In the  past 12 months, has lack of transportation kept you from medical appointments or from getting medications?: No    Lack of Transportation (Non-Medical): No  Physical Activity: Insufficiently Active (10/20/2023)   Exercise Vital Sign    Days of Exercise per Week: 5 days    Minutes of Exercise per Session: 20 min  Stress: No Stress Concern Present (10/20/2023)   [employer] of [employer]ational [employer] - Occupational Stress Questionnaire    Feeling of Stress : Not at all  Social Connections: Moderately Integrated (10/20/2023)   Social Connection and Isolation Panel    Frequency of Communication with Friends and Family: More than three times a week    Frequency of Social Gatherings with Friends and Family: Once a week    Attends Religious Services: Never    Database Administrator or Organizations: Yes    Attends Engineer, Structural: More than 4 times per year    Marital Status: Married  Catering Manager Violence: Not At Risk (10/20/2023)   Humiliation, Afraid, Rape, and Kick questionnaire    Fear of Current or Ex-Partner: No    Emotionally Abused: No    Physically Abused: No    Sexually Abused: No     RELEVANT GI HISTORY, IMAGING AND LABS: CBC    Component Value Date/Time   WBC 9.2 08/07/2024 1117   WBC 7.9 08/23/2023 1034   RBC 3.71 (L) 08/07/2024 1117   RBC 3.92 08/23/2023 1034  HGB 11.6 08/07/2024 1117   HCT 35.6 08/07/2024 1117   PLT 261 08/07/2024 1117   MCV 96 08/07/2024 1117   MCH 31.3 08/07/2024 1117   MCH 31.1 08/23/2023 1034   MCHC 32.6 08/07/2024 1117   MCHC 33.1 08/23/2023 1034   RDW 12.4 08/07/2024 1117   LYMPHSABS 0.9 08/07/2024 1117   MONOABS 0.7 08/23/2023 1034   EOSABS 3.4 (H) 08/07/2024 1117   BASOSABS 0.1 08/07/2024 1117   Recent Labs    05/05/24 1001 08/07/24 1117  HGB 11.9 11.6    CMP     Component Value Date/Time   NA 135 08/14/2024 1035   K 5.4 (H) 08/14/2024 1035   CL 100 08/14/2024 1035   CO2 16 (L) 08/14/2024 1035   GLUCOSE 92  08/14/2024 1035   GLUCOSE 101 (H) 04/16/2023 1254   BUN 49 (H) 08/14/2024 1035   CREATININE 1.55 (H) 08/14/2024 1035   CALCIUM  9.1 08/14/2024 1035   PROT 6.3 05/05/2024 1001   ALBUMIN  4.3 05/05/2024 1001   AST 17 05/05/2024 1001   ALT 15 05/05/2024 1001   ALKPHOS 87 05/05/2024 1001   BILITOT 0.4 05/05/2024 1001   GFRNONAA 52 (L) 04/16/2023 1254   GFRAA >60 04/22/2020 0959      Latest Ref Rng & Units 05/05/2024   10:01 AM 04/16/2023   12:54 PM 02/08/2023    1:27 PM  Hepatic Function  Total Protein 6.0 - 8.5 g/dL 6.3  7.0  7.1   Albumin  3.8 - 4.8 g/dL 4.3  4.0  3.9   AST 0 - 40 IU/L 17  17  20    ALT 0 - 32 IU/L 15  14  21    Alk Phosphatase 44 - 121 IU/L 87  81  100   Total Bilirubin 0.0 - 1.2 mg/dL 0.4  0.3  0.8       Review of Systems   All systems reviewed and negative except where noted in HPI.    Physical Exam  BP 113/73   Pulse 60   Temp 98.3 F (36.8 C)   Ht 4' 10 (1.473 m)   Wt 190 lb 6.4 oz (86.4 kg)   SpO2 97%   BMI 39.79 kg/m  No LMP recorded. Patient is postmenopausal. General:   Alert and oriented. Pleasant and cooperative. Well-nourished and well-developed. NAD. Head:  Normocephalic and atraumatic. Eyes:  Without icterus Ears:  Normal auditory acuity. Neck:  Supple; no masses or thyromegaly. Lungs:  Respirations even and unlabored.  Clear throughout to auscultation.   No wheezes, crackles, or rhonchi. No acute distress. Heart:  Regular rate and rhythm; no murmurs, clicks, rubs, or gallops. Abdomen:  Normal bowel sounds.  No bruits.  Soft, non-tender and non-distended without masses, hepatosplenomegaly or hernias noted.  No guarding or rebound tenderness.     Rectal:  Deferred. Msk:  Symmetrical without gross deformities. Normal posture. Extremities:  Without edema. Neurologic:  Alert and  oriented x4;  grossly normal neurologically. Skin:  Intact without significant lesions or rashes. Psych:  Alert and cooperative. Normal mood and  affect.   Assessment & Plan   JOLEAH KOSAK is a 78 y.o. female presenting today with diarrhea for the last 3 weeks.  Diarrhea.  WBC normal. GI stool profile negative on 08/15/2024 - awaiting fecal cal and celiac results ordered by PCP - Start Benefiber and increase water intake  - Sent in additional 10 days worth of Xifaxan  - sent in anusol  for rectal irritation. Discussed gently perianal care.    -  can continue Imodium up to 4 times daily  - Consider colonoscopy pending test results to assess for microscopic colitis.    GERD with bloating  - discussed lifestyle modifications - Stop omeprazole  - Start on pantoprazole  40 mg once daily take 30 minutes prior to eating  Follow up in 4 weeks, will monitor labs in the meantime.   Grayce Bohr, DNP, AGNP-C [facility] Gastroenterology

## 2024-08-23 NOTE — Telephone Encounter (Signed)
Please schedule.    KP 

## 2024-08-23 NOTE — Patient Instructions (Signed)
 Start benefiber and increase water.

## 2024-08-24 ENCOUNTER — Other Ambulatory Visit: Payer: Self-pay

## 2024-08-24 ENCOUNTER — Telehealth: Payer: Self-pay

## 2024-08-24 ENCOUNTER — Ambulatory Visit: Payer: Self-pay | Admitting: Internal Medicine

## 2024-08-24 LAB — CELIAC DISEASE PANEL
Endomysial IgA: NEGATIVE
Immunoglobulin A, (IgA) QN, Serum: 222 mg/dL (ref 64–422)
t-Transglutaminase (tTG) IgA: <2 U/mL (ref 0–3)

## 2024-08-24 LAB — CALPROTECTIN, FECAL: Calprotectin, Fecal: 278 ug/g — ABNORMAL HIGH (ref 0–120)

## 2024-08-24 NOTE — Telephone Encounter (Signed)
 Spoke with patient -she will come by office and pick up samples of Xafaxin from [facility]. Patient called [facility] and cost is 500 dollars.

## 2024-08-24 NOTE — Telephone Encounter (Signed)
 Please review.  KP

## 2024-08-25 ENCOUNTER — Ambulatory Visit: Payer: Self-pay | Admitting: Internal Medicine

## 2024-08-28 ENCOUNTER — Telehealth: Payer: Self-pay

## 2024-08-28 ENCOUNTER — Other Ambulatory Visit: Payer: Self-pay

## 2024-08-28 DIAGNOSIS — R197 Diarrhea, unspecified: Secondary | ICD-10-CM

## 2024-08-28 MED ORDER — NA SULFATE-K SULFATE-MG SULF 17.5-3.13-1.6 GM/177ML PO SOLN: 1.0000 | Freq: Once | ORAL | 0 refills | Status: AC

## 2024-08-28 NOTE — Telephone Encounter (Signed)
 Spoke with patient- Colonoscopy scheduled 09/13/24 [place] w/Dr.Scholls-bowel prep instructions discussed- Hold Jardiance  3 days prior. Patient requested EGD and I asked Robin and EGD is not needed at this time. Patient aware.

## 2024-08-28 NOTE — Telephone Encounter (Signed)
 error

## 2024-08-29 ENCOUNTER — Other Ambulatory Visit (HOSPITAL_COMMUNITY): Payer: Self-pay | Admitting: Cardiology

## 2024-08-30 ENCOUNTER — Other Ambulatory Visit: Payer: Self-pay | Admitting: Internal Medicine

## 2024-08-30 DIAGNOSIS — G2581 Restless legs syndrome: Secondary | ICD-10-CM

## 2024-09-05 ENCOUNTER — Encounter: Payer: Self-pay | Admitting: Gastroenterology

## 2024-09-05 ENCOUNTER — Telehealth: Payer: Self-pay

## 2024-09-05 NOTE — Anesthesia Preprocedure Evaluation (Addendum)
 Anesthesia Evaluation  Patient identified by MRN, date of birth, ID band Patient awake    Reviewed: Allergy & Precautions, NPO status , Patient's Chart, lab work & pertinent test results  History of Anesthesia Complications Negative for: history of anesthetic complications  Airway Mallampati: III   Neck ROM: Full    Dental  (+) Upper Dentures, Partial Lower   Pulmonary asthma , former smoker (quit 1992)   Pulmonary exam normal breath sounds clear to auscultation       Cardiovascular hypertension, +CHF  Normal cardiovascular exam Rhythm:Regular Rate:Normal  Cardiac cath 02/08/23:  1.  Mild to moderate nonobstructive coronary artery disease.  The coronary arteries are mildly calcified overall. 2.  Moderately to severely reduced LV systolic function. 3.  Severely elevated left ventricular end-diastolic pressure at 35 mmHg.   Echo 02/21/24:   1. Left ventricular ejection fraction, by estimation, is 55%. The left ventricle has normal function. Left ventricular diastolic parameters were normal.   2. Right ventricular systolic function is normal. The right ventricular size is normal.   3. Left atrial size was mildly dilated.   4. Mild mitral valve regurgitation.   5. The aortic valve has an indeterminant number of cusps. Aortic valve regurgitation is not visualized.  Comparison(s): The left ventricular function has improved.      Neuro/Psych  PSYCHIATRIC DISORDERS Anxiety     Meniere disease, HOH    GI/Hepatic ,GERD  ,,  Endo/Other    Class 3 obesity  Renal/GU negative Renal ROS     Musculoskeletal   Abdominal   Peds  Hematology negative hematology ROS (+)   Anesthesia Other Findings Cardiology note 09/08/24:  1.  Preoperative Cardiovascular Risk Assessment: According to the Revised Cardiac Risk Index (RCRI), her Perioperative Risk of Major Cardiac Event is (%): 0.9. Her Functional Capacity in METs is: 6.05 according to  the Duke Activity Status Index (DASI). The patient is doing well from a cardiac perspective. Therefore, based on ACC/AHA guidelines, the patient would be at acceptable risk for the planned procedure without further cardiovascular testing.    The patient was advised that if she develops new symptoms prior to surgery to contact our office to arrange for a follow-up visit, and she verbalized understanding.   No request to hold cardiac medications.   Reproductive/Obstetrics                              Anesthesia Physical Anesthesia Plan  ASA: 3  Anesthesia Plan: General   Post-op Pain Management:    Induction: Intravenous  PONV Risk Score and Plan: 3 and Propofol  infusion, TIVA and Treatment may vary due to age or medical condition  Airway Management Planned: Natural Airway  Additional Equipment:   Intra-op Plan:   Post-operative Plan:   Informed Consent: I have reviewed the patients History and Physical, chart, labs and discussed the procedure including the risks, benefits and alternatives for the proposed anesthesia with the patient or authorized representative who has indicated his/her understanding and acceptance.       Plan Discussed with: CRNA  Anesthesia Plan Comments: (LMA/GETA backup discussed.  Patient consented for risks of anesthesia including but not limited to:  - adverse reactions to medications - damage to eyes, teeth, lips or other oral mucosa - nerve damage due to positioning  - sore throat or hoarseness - damage to heart, brain, nerves, lungs, other parts of body or loss of life  Informed patient about  role of CRNA in peri- and intra-operative care.  Patient voiced understanding.)         Anesthesia Quick Evaluation

## 2024-09-05 NOTE — Telephone Encounter (Signed)
 Requested Prescriptions  Pending Prescriptions Disp Refills   gabapentin  (NEURONTIN ) 300 MG capsule [Pharmacy Med Name: GABAPENTIN  CAPS 300MG ] 180 capsule 1    Sig: TAKE 2 CAPSULES AT BEDTIME     Neurology: Anticonvulsants - gabapentin  Failed - 09/05/2024 11:02 AM      Failed - Cr in normal range and within 360 days    Creatinine, Ser  Date Value Ref Range Status  08/14/2024 1.55 (H) 0.57 - 1.00 mg/dL Final         Passed - Completed PHQ-2 or PHQ-9 in the last 360 days      Passed - Valid encounter within last 12 months    Recent Outpatient Visits           4 weeks ago Diarrhea of presumed infectious origin   [facility] Primary Care & [facility] Medicine at [facility], Leita DEL, MD   1 month ago Stress incontinence of urine   [facility] Primary Care & Sports Medicine at [facility], Leita DEL, MD   4 months ago Annual physical exam   [facility] at [facility], Leita DEL, MD       Future Appointments             In 2 weeks MacDiarmid, [place], MD [facility]

## 2024-09-05 NOTE — Telephone Encounter (Signed)
 Patient called into office at this time-discussed Jardiance  and reminded to hold 3 days prior. I let her know I would call her when I receive the cardiac clearance from dr.Dalton [facility] Colonoscopy scheduled 09/13/24.

## 2024-09-06 ENCOUNTER — Telehealth (HOSPITAL_BASED_OUTPATIENT_CLINIC_OR_DEPARTMENT_OTHER): Payer: Self-pay

## 2024-09-06 NOTE — Telephone Encounter (Signed)
 Called to schedule patient for pre-op clearance for her colonoscopy and she was not happy that she might need to come into our office stating, Well, can't we just do the clearance now and get it over with?' I explained to her that we would just need to do a telephone visit and she would not need to come into the office. She understood. Meds were reviewed and consent is done. She is to have pre-op clearance on 09/08/24, with Rosaline Bane, NP.       Patient Consent for Virtual Visit        Katrina Poole has provided verbal consent on 09/06/2024 for a virtual visit (video or telephone).   CONSENT FOR VIRTUAL VISIT FOR:  Katrina Poole  By participating in this virtual visit I agree to the following:  I hereby voluntarily request, consent and authorize [facility] and its employed or contracted physicians, physician assistants, nurse practitioners or other licensed health care professionals (the Practitioner), to provide me with telemedicine health care services (the "Services) as deemed necessary by the treating Practitioner. I acknowledge and consent to receive the Services by the Practitioner via telemedicine. I understand that the telemedicine visit will involve communicating with the Practitioner through live audiovisual communication technology and the disclosure of certain medical information by electronic transmission. I acknowledge that I have been given the opportunity to request an in-person assessment or other available alternative prior to the telemedicine visit and am voluntarily participating in the telemedicine visit.  I understand that I have the right to withhold or withdraw my consent to the use of telemedicine in the course of my care at any time, without affecting my right to future care or treatment, and that the Practitioner or I may terminate the telemedicine visit at any time. I understand that I have the right to inspect all information obtained and/or  recorded in the course of the telemedicine visit and may receive copies of available information for a reasonable fee.  I understand that some of the potential risks of receiving the Services via telemedicine include:  Delay or interruption in medical evaluation due to technological equipment failure or disruption; Information transmitted may not be sufficient (e.g. poor resolution of images) to allow for appropriate medical decision making by the Practitioner; and/or  In rare instances, security protocols could fail, causing a breach of personal health information.  Furthermore, I acknowledge that it is my responsibility to provide information about my medical history, conditions and care that is complete and accurate to the best of my ability. I acknowledge that Practitioner's advice, recommendations, and/or decision may be based on factors not within their control, such as incomplete or inaccurate data provided by me or distortions of diagnostic images or specimens that may result from electronic transmissions. I understand that the practice of medicine is not an exact science and that Practitioner makes no warranties or guarantees regarding treatment outcomes. I acknowledge that a copy of this consent can be made available to me via my patient portal [facility] MyChart), or I can request a printed copy by calling the office of [facility].    I understand that my insurance will be billed for this visit.   I have read or had this consent read to me. I understand the contents of this consent, which adequately explains the benefits and risks of the Services being provided via telemedicine.  I have been provided ample opportunity to ask questions regarding this consent and the Services and  have had my questions answered to my satisfaction. I give my informed consent for the services to be provided through the use of telemedicine in my medical care

## 2024-09-06 NOTE — Telephone Encounter (Signed)
   Pre-operative Risk Assessment    Patient Name: Katrina Poole  DOB: 27-Apr-1946 MRN: [id number]   Date of last office visit: 06/22/24 with Dr. McLean(Heart Failure)  Date of next office visit: NA  Request for Surgical Clearance    Procedure:  Colonoscopy  Date of Surgery:  Clearance TBD                                 Surgeon:  Na Surgeon's Group or Practice Name:  [facility] Phone number:  [phone] Fax number:  [phone]   Type of Clearance Requested:   - Medical    Type of Anesthesia:  general    Additional requests/questions:    [name]   09/06/2024, 12:19 PM

## 2024-09-06 NOTE — Telephone Encounter (Signed)
   Name: Katrina Poole  DOB: [birth year]  MRN: [id number]  Primary Cardiologist: Redell Cave, MD   Preoperative team, please contact this patient and set up a phone call appointment for further preoperative risk assessment. Please obtain consent and complete medication review. Thank you for your help.  I confirm that guidance regarding antiplatelet and oral anticoagulation therapy has been completed and, if necessary, noted below.  I also confirmed the patient resides in the state of [place] . As per [facility] Medical Board telemedicine laws, the patient must reside in the state in which the provider is licensed.   Jon Nat Hails, PA 09/06/2024, 1:04 PM [facility]

## 2024-09-08 ENCOUNTER — Ambulatory Visit: Attending: Cardiovascular Disease

## 2024-09-08 ENCOUNTER — Telehealth (HOSPITAL_COMMUNITY): Payer: Self-pay

## 2024-09-08 DIAGNOSIS — Z01810 Encounter for preprocedural cardiovascular examination: Secondary | ICD-10-CM

## 2024-09-08 NOTE — Progress Notes (Signed)
 Virtual Visit via Telephone Note   Because of Katrina Poole co-morbid illnesses, she is at least at moderate risk for complications without adequate follow up.  This format is felt to be most appropriate for this patient at this time.  Due to technical limitations with video connection (technology), today's appointment will be conducted as an audio only telehealth visit, and Katrina Poole verbally agreed to proceed in this manner.   All issues noted in this document were discussed and addressed.  No physical exam could be performed with this format.  Evaluation Performed:  Preoperative cardiovascular risk assessment _____________   Date:  09/08/2024   Patient ID:  Katrina Poole, DOB 08/09/1946, MRN [id number] Patient Location:  Home Provider location:   Office  Primary Care Provider:  Justus Leita DEL, MD Primary Cardiologist:  Redell Cave, MD  Chief Complaint / Patient Profile   78 y.o. y/o female with a h/o obesity, HFrEF, NICM, non-obstructive CAD, HTN who is pending colonoscopy and presents today for telephonic preoperative cardiovascular risk assessment.  History of Present Illness    Katrina Poole is a 78 y.o. female who presents via audio/video conferencing for a telehealth visit today.  Pt was last seen in cardiology clinic on 07/04/24 by Eleanor Crews, RPH.  At that time Katrina Poole was doing well.  The patient is now pending procedure as outlined above. Since her last visit, she denies chest pain, shortness of breath, lower extremity edema, fatigue, palpitations, melena, hematuria, diaphoresis, weakness, presyncope, syncope, orthopnea, and PND. She has been struggling with diarrhea since October 29 which has limited her activity. She is still able to achieve > 4 METS activity without concerning cardiac symptoms.    Past Medical History    Past Medical History:  Diagnosis Date   Acute urinary retention 02/08/2023   Allergy    Aortic  atherosclerosis    Arthritis    osteo of left hip   Asthma    humidity and exercise during summer   CAD (coronary artery disease) 02/08/2023   cath   Cardiomyopathy [facility]) 01/2023   Combined systolic and diastolic heart failure (HCC)    Complication of anesthesia    Pt reports fentanyl  caused her current heart problems   Deafness in right ear    Flash pulmonary edema (HCC) 02/08/2023   Generalized anxiety disorder    GERD (gastroesophageal reflux disease)    Heart palpitations    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   Hypertension    controlled on meds   Left bundle branch block    Lumbago of lumbar region with sciatica 06/12/2021   Meniere syndrome    hearing deaf right ear   Mild mitral regurgitation by prior echocardiogram 02/21/2024   Neuromuscular disorder (HCC)    finger tips go numb, neck vertebrae   Obesity    Restless leg syndrome    Spondylolisthesis, lumbosacral region    Status post arthroscopic surgery of left knee 08/15/2015   Vertigo    Wears dentures    full dentures on top, partial bottom   Past Surgical History:  Procedure Laterality Date   ABDOMINAL EXPOSURE N/A 10/09/2022   Procedure: ABDOMINAL EXPOSURE;  Surgeon: Gretta Lonni PARAS, MD;  Location[facility] OR;  Service: Vascular;  Laterality: N/A;   ANTERIOR CERVICAL DECOMP/DISCECTOMY FUSION N/A 03/16/2018   Procedure: Anterior Cervical Discectomy Fusion - Cervical Three- Cervical Four, Cervical Four- Cervical Five, Cervical Five-Cervical Six, Cervical Six- Cerival Seven;  Surgeon: Onetha,  Arley, MD;  Location: [facility] OR;  Service: Neurosurgery;  Laterality: N/A;  Anterior Cervical Discectomy Fusion - Cervical Three- Cervical Four, Cervical Four- Cervical Five, Cervical Five-Cervical Six, Cervical Six- Ceriva   ANTERIOR LUMBAR FUSION N/A 10/09/2022   Procedure: Anteriorr Lumbar Interbody Fusion - Level Five-Sacral One  with posterior cortical screw augmentation and decompressive laminotomy on the  right;  Surgeon: Onetha Arley, MD;  Location: [facility] OR;  Service: Neurosurgery;  Laterality: N/A;   APPENDECTOMY     BREAST SURGERY     Biopsy   BUNIONECTOMY     Left x1, Right x2.   CARDIAC CATHETERIZATION     x2 both normal   CARPAL TUNNEL RELEASE Right    COLONOSCOPY     COLONOSCOPY WITH PROPOFOL  N/A 06/04/2016   Procedure: COLONOSCOPY WITH PROPOFOL ;  Surgeon: Gladis RAYMOND Mariner, MD;  Location: [facility] ENDOSCOPY;  Service: Endoscopy;  Laterality: N/A;   DILATION AND CURETTAGE OF UTERUS     ECTOPIC PREGNANCY SURGERY     ESOPHAGOGASTRODUODENOSCOPY (EGD) WITH PROPOFOL  N/A 06/04/2016   Procedure: ESOPHAGOGASTRODUODENOSCOPY (EGD) WITH PROPOFOL ;  Surgeon: Gladis RAYMOND Mariner, MD;  Location: [facility] ENDOSCOPY;  Service: Endoscopy;  Laterality: N/A;   exploratory laparoscopy     infertility surgery   EYE SURGERY     Catarac removal   FRACTURE SURGERY     HALLUX VALGUS CORRECTION Bilateral    HARDWARE REMOVAL Left 11/05/2023   Procedure: HARDWARE REMOVAL;  Surgeon: Lennie Barter, DPM;  Location: [facility] ORS;  Service: Orthopedics/Podiatry;  Laterality: Left;   JOINT REPLACEMENT Bilateral    partial knee   KNEE ARTHROPLASTY Left 01/27/2021   Procedure: CONVERSION OF LEFT PARTIAL KNEE TO LEFT TOTAL KNEE.;  Surgeon: Mardee Lynwood SQUIBB, MD;  Location: [facility] ORS;  Service: Orthopedics;  Laterality: Left;   KNEE ARTHROSCOPY Left 07/19/2015   Procedure: Arthroscopic limited synovectomy along with lateral compartment and retropatellar chondral debridement plus removal of several small loose bodies;  Surgeon: Helayne Glenn, MD;  Location[facility];  Service: Orthopedics;  Laterality: Left;  1ST CASE PER CECE   LAMINECTOMY WITH POSTERIOR LATERAL ARTHRODESIS LEVEL 2 N/A 10/09/2022   Procedure: LAMINECTOMY WITH POSTERIOR LATERAL ARTHRODESIS LEVEL TWO;  Surgeon: Onetha Arley, MD;  Location: [facility] OR;  Service: Neurosurgery;  Laterality: N/A;   LEFT HEART CATH AND CORONARY ANGIOGRAPHY Left 02/08/2023   Procedure:  LEFT HEART CATH AND CORONARY ANGIOGRAPHY;  Surgeon: Darron Deatrice LABOR, MD;  Location: [facility] INVASIVE CV LAB;  Service: Cardiovascular;  Laterality: Left;   LUMBAR LAMINECTOMY/DECOMPRESSION MICRODISCECTOMY Right 04/29/2020   Procedure: Microdiscectomy - Lumbar Three-Lumbar Four - Lumbar Four-Lumbar Five - right;  Surgeon: Onetha Arley, MD;  Location: [facility] OR;  Service: Neurosurgery;  Laterality: Right;  Microdiscectomy - Lumbar Three-Lumbar Four - Lumbar Four-Lumbar Five - right   MEDIAL PARTIAL KNEE REPLACEMENT Bilateral    POSTERIOR LUMBAR FUSION  02/2022   L3-4 and L4-5   SHOULDER SURGERY Right    SPINE SURGERY     C3-7 fused   TOTAL HIP ARTHROPLASTY Left 11/25/2015   Procedure: TOTAL HIP ARTHROPLASTY;  Surgeon: Lynwood SQUIBB Mardee, MD;  Location: [facility] ORS;  Service: Orthopedics;  Laterality: Left;   TRIGGER FINGER RELEASE Left     Allergies  Allergies  Allergen Reactions   Azithromycin Hives and Other (See Comments)   Fentanyl  Shortness Of Breath   Sulfa Antibiotics Other (See Comments) and Rash    Hallucinations   Oxycodone  Other (See Comments)    Makes head foggy and interferes with Meniere's syndrome.  Sulfacetamide    Bovine (Beef) Protein-Containing Drug Products Other (See Comments)    Tears up my stomach.    Home Medications    Prior to Admission medications   Medication Sig Start Date End Date Taking? Authorizing Provider  acetaminophen  (TYLENOL ) 500 MG tablet Take 2 tablets (1,000 mg total) by mouth every 6 (six) hours as needed (DO NOT EXCEED 4000 MG DAILY FROM ALL SOURCES). Patient taking differently: Take 1,000 mg by mouth daily. 03/30/21   Justus Leita DEL, MD  albuterol  (VENTOLIN  HFA) 108 [phone] Base) MCG/ACT inhaler Inhale 2 puffs into the lungs every 6 (six) hours as needed for wheezing or shortness of breath. 03/09/22   Justus Leita DEL, MD  alum hydroxide-mag trisilicate (GAVISCON ) 80-20 MG CHEW chewable tablet Chew 1 tablet by mouth daily as needed for indigestion or  heartburn.    [provider]  budesonide -formoterol  (SYMBICORT ) 160-4.5 MCG/ACT inhaler Inhale 2 puffs into the lungs in the morning and at bedtime. 08/12/23   Assaker, Darrin, MD  carvedilol  (COREG ) 12.5 MG tablet Take 1.5 tablets (18.75 mg total) by mouth 2 (two) times daily. 02/02/24 09/06/24  Rolan Ezra RAMAN, MD  clonazePAM  (KLONOPIN ) 0.5 MG tablet TAKE 1 TABLET AT BEDTIME AS NEEDED FOR ANXIETY 06/15/24   Justus Leita DEL, MD  docusate sodium  (COLACE) 100 MG capsule Take 100 mg by mouth every other day. Patient taking differently: Take 100 mg by mouth daily as needed.    [provider]  empagliflozin  (JARDIANCE ) 10 MG TABS tablet Take 1 tablet (10 mg total) by mouth daily before breakfast. 11/08/23   Rolan Ezra RAMAN, MD  Evolocumab  (REPATHA  SURECLICK) 140 MG/ML SOAJ Inject 140 mg into the skin every 14 (fourteen) days. 11/08/23   Rolan Ezra RAMAN, MD  furosemide  (LASIX ) 20 MG tablet Take 1 tablet (20 mg total) by mouth daily as needed (swelling, shortness of breath, gain 3 lbs overnight or 5 lbs in a week). 10/15/23 10/14/24  Rolan Ezra RAMAN, MD  gabapentin  (NEURONTIN ) 300 MG capsule TAKE 2 CAPSULES AT BEDTIME 09/05/24   Berglund, Laura H, MD  hydrocortisone  (ANUSOL -HC) 2.5 % rectal cream Place 1 Application rectally 2 (two) times daily. 08/23/24   Celestia Rima, NP  meclizine  (ANTIVERT ) 12.5 MG tablet Take 12.5 mg by mouth 3 (three) times daily as needed for dizziness. Dr. Salli    [provider]  Multiple Vitamin (MULTIVITAMIN) tablet Take 1 tablet by mouth every morning.    [provider]  pantoprazole  (PROTONIX ) 40 MG tablet Take 1 tablet (40 mg total) by mouth daily. 08/23/24   Celestia Rima, NP  potassium chloride  (KLOR-CON  M) 10 MEQ tablet Take 1 tablet (10 mEq total) by mouth as needed (with lasix ). 10/15/23   Rolan Ezra RAMAN, MD  Probiotic Product (PROBIOTIC DAILY PO) Take 1 capsule by mouth daily.    [provider]  sacubitril -valsartan   (ENTRESTO ) 97-103 MG Take 1 tablet by mouth 2 (two) times daily. 03/16/24   Rolan Ezra RAMAN, MD  Spacer/Aero-Holding Chambers (AEROCHAMBER MV) inhaler Use as instructed 01/31/24   Malka Darrin, MD  spironolactone  (ALDACTONE ) 25 MG tablet TAKE 1 TABLET DAILY 08/29/24   McLean, Dalton S, MD  traMADol  (ULTRAM ) 50 MG tablet Take 50 mg by mouth daily. 09/04/22   [provider]  VITAMIN D  PO Take 5,000 Units by mouth daily.    [provider]  zolpidem  (AMBIEN ) 5 MG tablet Take 1 tablet (5 mg total) by mouth at bedtime. 07/13/24   Justus Leita DEL,  MD    Physical Exam    Vital Signs:  Katrina Poole does not have vital signs available for review today.  Given telephonic nature of communication, physical exam is limited. AAOx3. NAD. Normal affect.  Speech and respirations are unlabored.  Accessory Clinical Findings    None  Assessment & Plan    1.  Preoperative Cardiovascular Risk Assessment: According to the Revised Cardiac Risk Index (RCRI), her Perioperative Risk of Major Cardiac Event is (%): 0.9. Her Functional Capacity in METs is: 6.05 according to the Duke Activity Status Index (DASI). The patient is doing well from a cardiac perspective. Therefore, based on ACC/AHA guidelines, the patient would be at acceptable risk for the planned procedure without further cardiovascular testing.   The patient was advised that if she develops new symptoms prior to surgery to contact our office to arrange for a follow-up visit, and she verbalized understanding.  No request to hold cardiac medications.  A copy of this note will be routed to requesting surgeon.  Time:   Today, I have spent 10 minutes with the patient with telehealth technology discussing medical history, symptoms, and management plan.     Katrina EMERSON Bane, NP-C  09/08/2024, 3:06 PM [address], [address] [place], [place] [address] Office [phone] Fax [phone]

## 2024-09-08 NOTE — Telephone Encounter (Signed)
  [facility]   Pre-operative Risk Assessment   HEARTCARE STAFF-IMPORTANT INSTRUCTIONS 1 Red and Blue Text will auto delete once note is signed or closed. 2 Press F2 to navigate through template.   3 On drop down lists, L click to select >> R click to activate next field 4 Reason for Visit format is IMPORTANT!!  See Directions on No. 2 below. 5 Please review chart to determine if there is already a clearance note open for this procedure!!  DO NOT duplicate if a note already exists!!    :1}      Request for Surgical Clearance    Procedure:  Colonoscopy  Date of Surgery:  Clearance TBD                                 Surgeon:   Surgeon's Group or Practice Name:  [facility]  Phone number:  [phone] Fax number:  [phone]   Type of Clearance Requested:   - Medical    Type of Anesthesia:  General    Additional requests/questions:  Please fax a copy of Clearance  to the surgeon's office.  Signed, Lisa CHRISTELLA Sergeant   09/08/2024, 12:11 PM   [facility] Harlene Gainer, FNP [facility] [address] Heart and Vascular [place] [address] [phone] (office) [phone] (fax)

## 2024-09-08 NOTE — Telephone Encounter (Signed)
Clearance faxed via epic 

## 2024-09-11 ENCOUNTER — Encounter: Payer: Self-pay | Admitting: Gastroenterology

## 2024-09-11 ENCOUNTER — Telehealth: Payer: Self-pay

## 2024-09-11 NOTE — Telephone Encounter (Signed)
 Received cardiac clearance from Rosaline booty NP -patient is at acceptable risk for the planned procedure without further cardiovascular testing. Patient advised if she develops any new symptoms to call office.  Faxed clearance directly to [facility] for the Anesthesiologist to review.

## 2024-09-11 NOTE — Telephone Encounter (Signed)
 Per [facility] -OK to have planned Colonoscopy from CV standpoint. Per Harlene Gainer FNP

## 2024-09-12 NOTE — H&P (Signed)
 Katrina Schaffer, MD [facility] [address], [address] [place], [place] [address] Phone:[phone] Fax : [phone]  Primary Care Physician:  Justus Leita DEL, MD Primary Gastroenterologist:  Dr. Schaffer  Pre-Procedure History & Physical: HPI:  Katrina Poole is a 78 y.o. female is here for an colonoscopy as evaluation of diarrhea. 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  Prior procedure? Fhx CRC? Blood thinners?   Past Medical History:  Diagnosis Date   Acute urinary retention 02/08/2023   Allergy    Aortic atherosclerosis    Arthritis    osteo of left hip   Asthma    humidity and exercise during summer   CAD (coronary artery disease) 02/08/2023   cath   Cardiomyopathy [facility]) 01/2023   Combined systolic and diastolic heart failure (HCC)    Complication of anesthesia    Pt reports fentanyl  caused her current heart problems   Deafness in right ear    Flash pulmonary edema (HCC) 02/08/2023   Generalized anxiety disorder    GERD (gastroesophageal reflux disease)    Heart palpitations    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   Hypertension    controlled on meds   Left bundle branch block    Lumbago of lumbar region with sciatica 06/12/2021   Meniere syndrome    hearing deaf right ear   Mild mitral regurgitation by prior echocardiogram 02/21/2024   Neuromuscular disorder (HCC)    finger tips go numb, neck vertebrae   Obesity    Restless leg syndrome    Spondylolisthesis, lumbosacral region    Status post arthroscopic surgery of left knee 08/15/2015   Vertigo    Wears dentures    full dentures on top, partial bottom    Past Surgical History:  Procedure Laterality Date   ABDOMINAL EXPOSURE N/A 10/09/2022   Procedure: ABDOMINAL EXPOSURE;  Surgeon: Gretta Lonni PARAS, MD;  Location: [facility] OR;  Service: Vascular;  Laterality: N/A;   ANTERIOR CERVICAL DECOMP/DISCECTOMY FUSION N/A  03/16/2018   Procedure: Anterior Cervical Discectomy Fusion - Cervical Three- Cervical Four, Cervical Four- Cervical Five, Cervical Five-Cervical Six, Cervical Six- Cerival Seven;  Surgeon: Onetha Kuba, MD;  Location: [facility] OR;  Service: Neurosurgery;  Laterality: N/A;  Anterior Cervical Discectomy Fusion - Cervical Three- Cervical Four, Cervical Four- Cervical Five, Cervical Five-Cervical Six, Cervical Six- Ceriva   ANTERIOR LUMBAR FUSION N/A 10/09/2022   Procedure: Anteriorr Lumbar Interbody Fusion - Level Five-Sacral One  with posterior cortical screw augmentation and decompressive laminotomy on the right;  Surgeon: Onetha Kuba, MD;  Location: [facility] OR;  Service: Neurosurgery;  Laterality: N/A;   APPENDECTOMY     BREAST SURGERY     Biopsy   BUNIONECTOMY     Left x1, Right x2.   CARDIAC CATHETERIZATION     x2 both normal   CARPAL TUNNEL RELEASE Right    COLONOSCOPY     COLONOSCOPY WITH PROPOFOL  N/A 06/04/2016   Procedure: COLONOSCOPY WITH PROPOFOL ;  Surgeon: Gladis RAYMOND Mariner, MD;  Location: [facility] ENDOSCOPY;  Service: Endoscopy;  Laterality: N/A;   DILATION AND CURETTAGE OF UTERUS     ECTOPIC PREGNANCY SURGERY     ESOPHAGOGASTRODUODENOSCOPY (EGD) WITH PROPOFOL  N/A 06/04/2016   Procedure: ESOPHAGOGASTRODUODENOSCOPY (EGD) WITH PROPOFOL ;  Surgeon: Gladis RAYMOND Mariner, MD;  Location: [facility] ENDOSCOPY;  Service: Endoscopy;  Laterality: N/A;   exploratory laparoscopy     infertility surgery   EYE SURGERY     Catarac  removal   FRACTURE SURGERY     HALLUX VALGUS CORRECTION Bilateral    HARDWARE REMOVAL Left 11/05/2023   Procedure: HARDWARE REMOVAL;  Surgeon: Lennie Barter, DPM;  Location: [facility] ORS;  Service: Orthopedics/Podiatry;  Laterality: Left;   JOINT REPLACEMENT Bilateral    partial knee   KNEE ARTHROPLASTY Left 01/27/2021   Procedure: CONVERSION OF LEFT PARTIAL KNEE TO LEFT TOTAL KNEE.;  Surgeon: Mardee Lynwood SQUIBB, MD;  Location: [facility] ORS;  Service: Orthopedics;  Laterality: Left;   KNEE ARTHROSCOPY  Left 07/19/2015   Procedure: Arthroscopic limited synovectomy along with lateral compartment and retropatellar chondral debridement plus removal of several small loose bodies;  Surgeon: Helayne Glenn, MD;  Location: [facility];  Service: Orthopedics;  Laterality: Left;  1ST CASE PER CECE   LAMINECTOMY WITH POSTERIOR LATERAL ARTHRODESIS LEVEL 2 N/A 10/09/2022   Procedure: LAMINECTOMY WITH POSTERIOR LATERAL ARTHRODESIS LEVEL TWO;  Surgeon: Onetha Kuba, MD;  Location: [facility] OR;  Service: Neurosurgery;  Laterality: N/A;   LEFT HEART CATH AND CORONARY ANGIOGRAPHY Left 02/08/2023   Procedure: LEFT HEART CATH AND CORONARY ANGIOGRAPHY;  Surgeon: Darron Deatrice LABOR, MD;  Location: [facility] INVASIVE CV LAB;  Service: Cardiovascular;  Laterality: Left;   LUMBAR LAMINECTOMY/DECOMPRESSION MICRODISCECTOMY Right 04/29/2020   Procedure: Microdiscectomy - Lumbar Three-Lumbar Four - Lumbar Four-Lumbar Five - right;  Surgeon: Onetha Kuba, MD;  Location: [facility] OR;  Service: Neurosurgery;  Laterality: Right;  Microdiscectomy - Lumbar Three-Lumbar Four - Lumbar Four-Lumbar Five - right   MEDIAL PARTIAL KNEE REPLACEMENT Bilateral    POSTERIOR LUMBAR FUSION  02/2022   L3-4 and L4-5   SHOULDER SURGERY Right    SPINE SURGERY     C3-7 fused   TOTAL HIP ARTHROPLASTY Left 11/25/2015   Procedure: TOTAL HIP ARTHROPLASTY;  Surgeon: Lynwood SQUIBB Mardee, MD;  Location: [facility] ORS;  Service: Orthopedics;  Laterality: Left;   TRIGGER FINGER RELEASE Left     Prior to Admission medications   Medication Sig Start Date End Date Taking? Authorizing Provider  acetaminophen  (TYLENOL ) 500 MG tablet Take 2 tablets (1,000 mg total) by mouth every 6 (six) hours as needed (DO NOT EXCEED 4000 MG DAILY FROM ALL SOURCES). Patient taking differently: Take 1,000 mg by mouth daily. 03/30/21  Yes Justus Leita DEL, MD  alum hydroxide-mag trisilicate (GAVISCON ) 80-20 MG CHEW chewable tablet Chew 1 tablet by mouth daily as needed for indigestion or heartburn.    Yes [provider]  budesonide -formoterol  (SYMBICORT ) 160-4.5 MCG/ACT inhaler Inhale 2 puffs into the lungs in the morning and at bedtime. 08/12/23  Yes Assaker, Darrin, MD  clonazePAM  (KLONOPIN ) 0.5 MG tablet TAKE 1 TABLET AT BEDTIME AS NEEDED FOR ANXIETY 06/15/24  Yes Justus Leita DEL, MD  docusate sodium  (COLACE) 100 MG capsule Take 100 mg by mouth every other day. Patient taking differently: Take 100 mg by mouth daily as needed.   Yes [provider]  empagliflozin  (JARDIANCE ) 10 MG TABS tablet Take 1 tablet (10 mg total) by mouth daily before breakfast. 11/08/23  Yes Rolan Ezra RAMAN, MD  Evolocumab  (REPATHA  SURECLICK) 140 MG/ML SOAJ Inject 140 mg into the skin every 14 (fourteen) days. 11/08/23  Yes Rolan Ezra RAMAN, MD  gabapentin  (NEURONTIN ) 300 MG capsule TAKE 2 CAPSULES AT BEDTIME 09/05/24  Yes Berglund, Laura H, MD  hydrocortisone  (ANUSOL -HC) 2.5 % rectal cream Place 1 Application rectally 2 (two) times daily. 08/23/24  Yes Celestia Rima, NP  meclizine  (ANTIVERT ) 12.5 MG tablet Take 12.5 mg by mouth 3 (three) times daily as  needed for dizziness. Dr. Salli   Yes [provider]  Multiple Vitamin (MULTIVITAMIN) tablet Take 1 tablet by mouth every morning.   Yes [provider]  pantoprazole  (PROTONIX ) 40 MG tablet Take 1 tablet (40 mg total) by mouth daily. 08/23/24  Yes Celestia Rima, NP  Probiotic Product (PROBIOTIC DAILY PO) Take 1 capsule by mouth daily.   Yes [provider]  sacubitril -valsartan  (ENTRESTO ) 97-103 MG Take 1 tablet by mouth 2 (two) times daily. 03/16/24  Yes Rolan Ezra RAMAN, MD  Spacer/Aero-Holding Chambers (AEROCHAMBER MV) inhaler Use as instructed 01/31/24  Yes Assaker, Darrin, MD  spironolactone  (ALDACTONE ) 25 MG tablet TAKE 1 TABLET DAILY 08/29/24  Yes McLean, Dalton S, MD  traMADol  (ULTRAM ) 50 MG tablet Take 50 mg by mouth daily. 09/04/22  Yes [provider]  VITAMIN D  PO Take 5,000 Units by mouth daily.    Yes [provider]  zolpidem  (AMBIEN ) 5 MG tablet Take 1 tablet (5 mg total) by mouth at bedtime. 07/13/24  Yes Justus Leita DEL, MD  albuterol  (VENTOLIN  HFA) 108 (90 Base) MCG/ACT inhaler Inhale 2 puffs into the lungs every 6 (six) hours as needed for wheezing or shortness of breath. Patient not taking: Reported on 09/11/2024 03/09/22   Justus Leita DEL, MD  carvedilol  (COREG ) 12.5 MG tablet Take 1.5 tablets (18.75 mg total) by mouth 2 (two) times daily. 02/02/24 09/06/24  Rolan Ezra RAMAN, MD  furosemide  (LASIX ) 20 MG tablet Take 1 tablet (20 mg total) by mouth daily as needed (swelling, shortness of breath, gain 3 lbs overnight or 5 lbs in a week). Patient not taking: Reported on 09/11/2024 10/15/23 10/14/24  Rolan Ezra RAMAN, MD  potassium chloride  (KLOR-CON  M) 10 MEQ tablet Take 1 tablet (10 mEq total) by mouth as needed (with lasix ). Patient not taking: Reported on 09/11/2024 10/15/23   Rolan Ezra RAMAN, MD    Allergies as of 08/28/2024 - Review Complete 08/23/2024  Allergen Reaction Noted   Azithromycin Hives and Other (See Comments) 03/06/2014   Fentanyl  Shortness Of Breath 10/28/2023   Sulfa antibiotics Other (See Comments) and Rash 03/06/2014   Oxycodone  Other (See Comments) 04/22/2020   Sulfacetamide  09/17/2022   Bovine (beef) protein-containing drug products Other (See Comments) 11/13/2015    Family History  Problem Relation Age of Onset   Heart failure Mother    COPD Mother    Hypertension Mother    Heart disease Mother    Cancer Father    Heart attack Brother    Heart attack Brother 75   Heart attack Brother    Heart disease Brother    Heart disease Brother    Heart attack Brother    Heart disease Brother     Social History   Socioeconomic History   Marital status: Married    Spouse name: Francis Coventry   Number of children: 1   Years of education: Not on file   Highest education level: Not on file  Occupational History   Occupation: Part time  Tobacco  Use   Smoking status: Former    Current packs/day: 0.00    Average packs/day: 1.5 packs/day for 30.0 years (45.0 ttl pk-yrs)    Types: Cigarettes    Start date: 11/12/1960    Quit date: 11/12/1990    Years since quitting: 33.8   Smokeless tobacco: Never  Vaping Use   Vaping status: Never Used  Substance and Sexual Activity   Alcohol use: Yes    Alcohol/week: 1.0 standard drink of  alcohol    Types: 1 Standard drinks or equivalent per week    Comment: OCC [place], maybe 1 drink a week   Drug use: No   Sexual activity: Not Currently  Other Topics Concern   Not on file  Social History Narrative   Not on file   Social Drivers of Health   Financial Resource Strain: Low Risk  (12/21/2023)   Received from [facility]   Overall Financial Resource Strain (CARDIA)    Difficulty of Paying Living Expenses: Not hard at all  Food Insecurity: No Food Insecurity (12/21/2023)   Received from [facility]   Hunger Vital Sign    Within the past 12 months, you worried that your food would run out before you got the money to buy more.: Never true    Within the past 12 months, the food you bought just didn't last and you didn't have money to get more.: Never true  Transportation Needs: No Transportation Needs (12/21/2023)   Received from [facility] - Transportation    In the past 12 months, has lack of transportation kept you from medical appointments or from getting medications?: No    Lack of Transportation (Non-Medical): No  Physical Activity: Insufficiently Active (10/20/2023)   Exercise Vital Sign    Days of Exercise per Week: 5 days    Minutes of Exercise per Session: 20 min  Stress: No Stress Concern Present (10/20/2023)   [employer] of [employer]ational [employer] - Occupational Stress Questionnaire    Feeling of Stress : Not at all  Social Connections: Moderately Integrated (10/20/2023)   Social Connection and Isolation  Panel    Frequency of Communication with Friends and Family: More than three times a week    Frequency of Social Gatherings with Friends and Family: Once a week    Attends Religious Services: Never    Database Administrator or Organizations: Yes    Attends Engineer, Structural: More than 4 times per year    Marital Status: Married  Catering Manager Violence: Not At Risk (10/20/2023)   Humiliation, Afraid, Rape, and Kick questionnaire    Fear of Current or Ex-Partner: No    Emotionally Abused: No    Physically Abused: No    Sexually Abused: No    Review of Systems: See HPI, otherwise negative ROS  Physical Exam: Wt 88.5 kg   BMI 40.76 kg/m  General:   Alert,  pleasant and cooperative in NAD Head:  Normocephalic and atraumatic. Neck:  Supple; no masses or thyromegaly. Lungs:  Clear throughout to auscultation.    Heart:  Regular rate and rhythm. Abdomen:  Soft, nontender and nondistended. Normal bowel sounds, without guarding, and without rebound.   Neurologic:  Alert and  oriented x4;  grossly normal neurologically.  Impression/Plan: Katrina Poole is here for an colonoscopy to be performed for evaluation of diarrhea.  Risks, benefits, limitations, and alternatives regarding  colonoscopy have been reviewed with the patient.  Questions have been answered.  All parties agreeable.   MELANY Katrina HERO, MD  09/12/2024, 1:03 PM

## 2024-09-13 ENCOUNTER — Other Ambulatory Visit: Payer: Self-pay

## 2024-09-13 ENCOUNTER — Encounter: Payer: Self-pay | Admitting: Gastroenterology

## 2024-09-13 ENCOUNTER — Encounter: Admission: RE | Disposition: A | Payer: Self-pay | Attending: Gastroenterology

## 2024-09-13 ENCOUNTER — Ambulatory Visit: Payer: Self-pay | Admitting: Anesthesiology

## 2024-09-13 ENCOUNTER — Ambulatory Visit
Admission: RE | Admit: 2024-09-13 | Discharge: 2024-09-13 | Disposition: A | Attending: Gastroenterology | Admitting: Gastroenterology

## 2024-09-13 DIAGNOSIS — K5730 Diverticulosis of large intestine without perforation or abscess without bleeding: Secondary | ICD-10-CM | POA: Diagnosis not present

## 2024-09-13 DIAGNOSIS — K5520 Angiodysplasia of colon without hemorrhage: Secondary | ICD-10-CM | POA: Diagnosis not present

## 2024-09-13 DIAGNOSIS — K640 First degree hemorrhoids: Secondary | ICD-10-CM

## 2024-09-13 DIAGNOSIS — R197 Diarrhea, unspecified: Secondary | ICD-10-CM | POA: Diagnosis not present

## 2024-09-13 HISTORY — DX: Palpitations: R00.2

## 2024-09-13 HISTORY — PX: COLONOSCOPY: SHX5424

## 2024-09-13 HISTORY — DX: Left bundle-branch block, unspecified: I44.7

## 2024-09-13 HISTORY — DX: Obesity, unspecified: E66.9

## 2024-09-13 HISTORY — DX: Generalized anxiety disorder: F41.1

## 2024-09-13 HISTORY — DX: Unspecified hearing loss, right ear: H91.91

## 2024-09-13 SURGERY — COLONOSCOPY
Anesthesia: General | Site: Rectum

## 2024-09-13 MED ORDER — STERILE WATER FOR IRRIGATION IR SOLN
Status: DC | PRN
  Administered 2024-09-13: 1

## 2024-09-13 MED ORDER — LACTATED RINGERS IV SOLN: INTRAVENOUS | Status: DC

## 2024-09-13 MED ORDER — PROPOFOL 10 MG/ML IV BOLUS
INTRAVENOUS | Status: DC | PRN
  Administered 2024-09-13: 100 mg via INTRAVENOUS
  Administered 2024-09-13: 120 ug/kg/min via INTRAVENOUS

## 2024-09-13 MED ORDER — SODIUM CHLORIDE 0.9 % IV SOLN: INTRAVENOUS | Status: DC

## 2024-09-13 SURGICAL SUPPLY — 6 items
FORCEPS BIOP RAD 4 LRG CAP 4 (CUTTING FORCEPS) IMPLANT
GOWN CVR UNV OPN BCK APRN NK (MISCELLANEOUS) ×2 IMPLANT
KIT PROCEDURE OLYMPUS (MISCELLANEOUS) ×1 IMPLANT
MANIFOLD NEPTUNE II (INSTRUMENTS) ×1 IMPLANT
STRAP BODY AND KNEE 60X3 (MISCELLANEOUS) IMPLANT
WATER STERILE IRR 250ML POUR (IV SOLUTION) ×1 IMPLANT

## 2024-09-13 NOTE — Transfer of Care (Signed)
 Immediate Anesthesia Transfer of Care Note  Patient: Katrina Poole  Procedure(s) Performed: COLONOSCOPY WITH BIOPSY (Rectum)  Patient Location: PACU  Anesthesia Type: General  Level of Consciousness: awake, alert  and patient cooperative  Airway and Oxygen Therapy: Patient Spontanous Breathing   Post-op Assessment: Post-op Vital signs reviewed, Patient's Cardiovascular Status Stable, Respiratory Function Stable, Patent Airway and No signs of Nausea or vomiting  Post-op Vital Signs: Reviewed and stable  Complications: No notable events documented.

## 2024-09-13 NOTE — Op Note (Addendum)
 [facility]astroenter[facility] Patient Name: Katrina Poole Procedure Date: 09/13/2024 10:09 AM MRN: [id number] Account #: [id number] Date of Birth: May 12, 1946 Admit Type: Outpatient Age: 78 Room: [facility] OR ROOM 01 Gender: Female Note Status: Finalized Instrument Name: Peds Colonoscope [id number] Procedure:             Colonoscopy Indications:           Clinically significant diarrhea of unexplained origin Providers:             Clotilda Schaffer, MD Referring MD:          Clotilda Schaffer, MD (Referring MD), Leita Adie, MD                         (Referring MD) Medicines:             Propofol  per Anesthesia Complications:         No immediate complications. Estimated blood loss: None. Procedure:             Pre-Anesthesia Assessment:                        - Prior to the procedure, a History and Physical was                         performed, and patient medications and allergies were                         reviewed. The patient's tolerance of previous                         anesthesia was also reviewed. The risks and benefits                         of the procedure and the sedation options and risks                         were discussed with the patient. All questions were                         answered, and informed consent was obtained. Prior                         Anticoagulants: The patient has taken no anticoagulant                         or antiplatelet agents. ASA Grade Assessment: II - A                         patient with mild systemic disease. After reviewing                         the risks and benefits, the patient was deemed in                         satisfactory condition to undergo the procedure.                        After obtaining informed consent, the colonoscope was  passed under direct vision. Throughout the procedure,                         the patient's blood pressure, pulse, and oxygen                          saturations were monitored continuously. The                         Colonoscope was introduced through the anus and                         advanced to the 10 cm into the ileum. The terminal                         ileum, ileocecal valve, appendiceal orifice, and                         rectum were photographed. The quality of the bowel                         preparation was good. Findings:      A few small-mouthed diverticula were found in the sigmoid colon.      External and internal hemorrhoids were found. The hemorrhoids were Grade       I (internal hemorrhoids that do not prolapse).      The terminal ileum appeared normal.      Biopsies for histology were taken with a cold forceps from the entire       colon for evaluation of microscopic colitis.      A single large angioectasia without bleeding was found in the cecum. Impression:            - Diverticulosis in the sigmoid colon.                        - External and internal hemorrhoids.                        - The examined portion of the ileum was normal.                        - A single non-bleeding colonic angioectasia.                        - Biopsies were taken with a cold forceps from the                         entire colon for evaluation of microscopic colitis. Recommendation:        - Repeat colonoscopy in 10 years for screening                         purposes.                        - High fiber diet indefinitely.                        - Add FiberAll to your current Benefiber.                        -  Await pathology results.                        - Resume all medications today. Procedure Code(s):     --- Professional ---                        [phone], Colonoscopy, flexible; with biopsy, single or                         multiple Diagnosis Code(s):     --- Professional ---                        K57.30, Diverticulosis of large intestine without                         perforation or abscess without bleeding                         R19.7, Diarrhea, unspecified                        K64.0, First degree hemorrhoids CPT copyright 2022 American Medical Association. All rights reserved. The codes documented in this report are preliminary and upon coder review may  be revised to meet current compliance requirements. Clotilda Schaffer, MD 09/13/2024 10:38:19 AM Number of Addenda: 0 Note Initiated On: 09/13/2024 10:09 AM Scope Withdrawal Time: 0 hours 10 minutes 46 seconds  Total Procedure Duration: 0 hours 12 minutes 8 seconds  Estimated Blood Loss:  Estimated blood loss: none.      [facility]

## 2024-09-13 NOTE — Anesthesia Postprocedure Evaluation (Signed)
 Anesthesia Post Note  Patient: Katrina Poole  Procedure(s) Performed: COLONOSCOPY WITH BIOPSY (Rectum)  Patient location during evaluation: PACU Anesthesia Type: General Level of consciousness: awake and alert, oriented and patient cooperative Pain management: pain level controlled Vital Signs Assessment: post-procedure vital signs reviewed and stable Respiratory status: spontaneous breathing, nonlabored ventilation and respiratory function stable Cardiovascular status: blood pressure returned to baseline and stable Postop Assessment: adequate PO intake Anesthetic complications: no   No notable events documented.   Last Vitals:  Vitals:   09/13/24 1035 09/13/24 1048  BP: (!) 120/59 125/64  Pulse: 69 (!) 59  Resp: 14 13  Temp: 36.6 C 36.6 C  SpO2: 97% 99%    Last Pain:  Vitals:   09/13/24 1048  TempSrc:   PainSc: 0-No pain                 Alfonso Ruths

## 2024-09-14 ENCOUNTER — Telehealth: Payer: Self-pay

## 2024-09-14 ENCOUNTER — Other Ambulatory Visit (HOSPITAL_COMMUNITY): Payer: Self-pay | Admitting: *Deleted

## 2024-09-14 DIAGNOSIS — I5022 Chronic systolic (congestive) heart failure: Secondary | ICD-10-CM

## 2024-09-14 DIAGNOSIS — I5042 Chronic combined systolic (congestive) and diastolic (congestive) heart failure: Secondary | ICD-10-CM

## 2024-09-14 MED ORDER — EMPAGLIFLOZIN 10 MG PO TABS: 10.0000 mg | ORAL_TABLET | Freq: Every day | ORAL | 3 refills | Status: AC

## 2024-09-14 NOTE — Telephone Encounter (Signed)
 Please call pt she had procedure yesterday and per pt she is having some issues now.

## 2024-09-14 NOTE — Telephone Encounter (Signed)
 Called patient back to let her know not to be alarmed and lets see how things go through the weekend.

## 2024-09-14 NOTE — Telephone Encounter (Signed)
 Colonoscopy yesterday-formed stool after antibiotics-then last night she started having diarrhea- today had diarrhea  this morning-taking benefiber and Metamucil today per Dr.Scholl-please advise. I let her know that she may have some residual loose/or diarrhea from the colonoscopy itself but would let you know and let you advise.

## 2024-09-15 ENCOUNTER — Ambulatory Visit: Payer: Self-pay | Admitting: Gastroenterology

## 2024-09-15 LAB — SURGICAL PATHOLOGY

## 2024-09-20 ENCOUNTER — Telehealth: Payer: Self-pay | Admitting: Pharmacist

## 2024-09-20 DIAGNOSIS — R609 Edema, unspecified: Secondary | ICD-10-CM | POA: Insufficient documentation

## 2024-09-20 MED ORDER — REPATHA SURECLICK 140 MG/ML ~~LOC~~ SOAJ: 140.0000 mg | SUBCUTANEOUS | 3 refills | Status: AC

## 2024-09-20 NOTE — Telephone Encounter (Signed)
 Refill sent for Repatha  per CHARM Shuck.

## 2024-09-25 ENCOUNTER — Ambulatory Visit: Admitting: Urology

## 2024-09-25 VITALS — BP 123/66 | HR 60 | Ht <= 58 in | Wt 192.0 lb

## 2024-09-25 DIAGNOSIS — N3946 Mixed incontinence: Secondary | ICD-10-CM

## 2024-09-25 MED ORDER — GEMTESA 75 MG PO TABS: 75.0000 mg | ORAL_TABLET | Freq: Every day | ORAL | 11 refills | Status: DC

## 2024-09-25 NOTE — Patient Instructions (Signed)

## 2024-09-25 NOTE — Addendum Note (Signed)
 Addended by: CLYDELL GLATTER A on: 09/25/2024 08:56 AM   Modules accepted: Orders

## 2024-09-25 NOTE — Progress Notes (Signed)
 "  09/25/2024 8:30 AM   CHAZ RONNING 06-16-1946 [id number]  Referring provider: Justus Leita DEL, MD [address] [place],  [place] [address]  Chief Complaint  Patient presents with   Urinary Incontinence    HPI: I was consulted to assess the patient's urinary incontinence.  She primarily has urge incontinence.  Running water  in the shower is a trigger.  Sometimes she leaks with coughing sneezing but does not have bedwetting.  Wears 1 pad a day damp to moderately wet  She voids every 60 to 90 minutes and has no nocturia  She has had 3 back surgeries from neck to lower back.  She has had a sling.  No hysterectomy  Loose bowel movements.  No treatment   PMH: Past Medical History:  Diagnosis Date   Acute urinary retention 02/08/2023   Allergy    Aortic atherosclerosis    Arthritis    osteo of left hip   Asthma    humidity and exercise during summer   CAD (coronary artery disease) 02/08/2023   cath   Cardiomyopathy [facility]) 01/2023   Combined systolic and diastolic heart failure (HCC)    Complication of anesthesia    Pt reports fentanyl  caused her current heart problems   Deafness in right ear    Edema 09/20/2024   Flash pulmonary edema (HCC) 02/08/2023   Generalized anxiety disorder    GERD (gastroesophageal reflux disease)    Heart palpitations    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   Hypertension    controlled on meds   Left bundle branch block    Lumbago of lumbar region with sciatica 06/12/2021   Meniere syndrome    hearing deaf right ear   Mild mitral regurgitation by prior echocardiogram 02/21/2024   Neuromuscular disorder (HCC)    finger tips go numb, neck vertebrae   Obesity    Restless leg syndrome    Spondylolisthesis, lumbosacral region    Status post arthroscopic surgery of left knee 08/15/2015   Vertigo    Wears dentures    full dentures on top, partial bottom    Surgical History: Past Surgical History:   Procedure Laterality Date   ABDOMINAL EXPOSURE N/A 10/09/2022   Procedure: ABDOMINAL EXPOSURE;  Surgeon: Gretta Lonni PARAS, MD;  Location: [facility] OR;  Service: Vascular;  Laterality: N/A;   ANTERIOR CERVICAL DECOMP/DISCECTOMY FUSION N/A 03/16/2018   Procedure: Anterior Cervical Discectomy Fusion - Cervical Three- Cervical Four, Cervical Four- Cervical Five, Cervical Five-Cervical Six, Cervical Six- Cerival Seven;  Surgeon: Onetha Kuba, MD;  Location: [facility] OR;  Service: Neurosurgery;  Laterality: N/A;  Anterior Cervical Discectomy Fusion - Cervical Three- Cervical Four, Cervical Four- Cervical Five, Cervical Five-Cervical Six, Cervical Six- Ceriva   ANTERIOR LUMBAR FUSION N/A 10/09/2022   Procedure: Anteriorr Lumbar Interbody Fusion - Level Five-Sacral One  with posterior cortical screw augmentation and decompressive laminotomy on the right;  Surgeon: Onetha Kuba, MD;  Location: [facility] OR;  Service: Neurosurgery;  Laterality: N/A;   APPENDECTOMY     BREAST SURGERY     Biopsy   BUNIONECTOMY     Left x1, Right x2.   CARDIAC CATHETERIZATION     x2 both normal   CARPAL TUNNEL RELEASE Right    COLONOSCOPY     COLONOSCOPY N/A 09/13/2024   Procedure: COLONOSCOPY WITH BIOPSY;  Surgeon: Melany Clotilda HERO, MD;  Location: [facility];  Service: Endoscopy;  Laterality: N/A;   COLONOSCOPY WITH PROPOFOL  N/A 06/04/2016   Procedure:  COLONOSCOPY WITH PROPOFOL ;  Surgeon: Gladis RAYMOND Mariner, MD;  Location: [facility] ENDOSCOPY;  Service: Endoscopy;  Laterality: N/A;   DILATION AND CURETTAGE OF UTERUS     ECTOPIC PREGNANCY SURGERY     ESOPHAGOGASTRODUODENOSCOPY (EGD) WITH PROPOFOL  N/A 06/04/2016   Procedure: ESOPHAGOGASTRODUODENOSCOPY (EGD) WITH PROPOFOL ;  Surgeon: Gladis RAYMOND Mariner, MD;  Location: [facility] ENDOSCOPY;  Service: Endoscopy;  Laterality: N/A;   exploratory laparoscopy     infertility surgery   EYE SURGERY     Catarac removal   FRACTURE SURGERY     HALLUX VALGUS CORRECTION Bilateral    HARDWARE REMOVAL  Left 11/05/2023   Procedure: HARDWARE REMOVAL;  Surgeon: Lennie Barter, DPM;  Location: [facility] ORS;  Service: Orthopedics/Podiatry;  Laterality: Left;   JOINT REPLACEMENT Bilateral    partial knee   KNEE ARTHROPLASTY Left 01/27/2021   Procedure: CONVERSION OF LEFT PARTIAL KNEE TO LEFT TOTAL KNEE.;  Surgeon: Mardee Lynwood SQUIBB, MD;  Location: [facility] ORS;  Service: Orthopedics;  Laterality: Left;   KNEE ARTHROSCOPY Left 07/19/2015   Procedure: Arthroscopic limited synovectomy along with lateral compartment and retropatellar chondral debridement plus removal of several small loose bodies;  Surgeon: Helayne Glenn, MD;  Location: [facility];  Service: Orthopedics;  Laterality: Left;  1ST CASE PER CECE   LAMINECTOMY WITH POSTERIOR LATERAL ARTHRODESIS LEVEL 2 N/A 10/09/2022   Procedure: LAMINECTOMY WITH POSTERIOR LATERAL ARTHRODESIS LEVEL TWO;  Surgeon: Onetha Kuba, MD;  Location: [facility] OR;  Service: Neurosurgery;  Laterality: N/A;   LEFT HEART CATH AND CORONARY ANGIOGRAPHY Left 02/08/2023   Procedure: LEFT HEART CATH AND CORONARY ANGIOGRAPHY;  Surgeon: Darron Deatrice LABOR, MD;  Location: [facility] INVASIVE CV LAB;  Service: Cardiovascular;  Laterality: Left;   LUMBAR LAMINECTOMY/DECOMPRESSION MICRODISCECTOMY Right 04/29/2020   Procedure: Microdiscectomy - Lumbar Three-Lumbar Four - Lumbar Four-Lumbar Five - right;  Surgeon: Onetha Kuba, MD;  Location: [facility] OR;  Service: Neurosurgery;  Laterality: Right;  Microdiscectomy - Lumbar Three-Lumbar Four - Lumbar Four-Lumbar Five - right   MEDIAL PARTIAL KNEE REPLACEMENT Bilateral    POSTERIOR LUMBAR FUSION  02/2022   L3-4 and L4-5   SHOULDER SURGERY Right    SPINE SURGERY     C3-7 fused   TOTAL HIP ARTHROPLASTY Left 11/25/2015   Procedure: TOTAL HIP ARTHROPLASTY;  Surgeon: Lynwood SQUIBB Mardee, MD;  Location: [facility] ORS;  Service: Orthopedics;  Laterality: Left;   TRIGGER FINGER RELEASE Left     Home Medications:  Allergies as of 09/25/2024       Reactions    Azithromycin Hives, Other (See Comments)   Fentanyl  Shortness Of Breath   Sulfa Antibiotics Other (See Comments), Rash   Hallucinations   Oxycodone  Other (See Comments)   Makes head foggy and interferes with Meniere's syndrome.     Sulfacetamide    Bovine (beef) Protein-containing Drug Products Other (See Comments)   Tears up my stomach.        Medication List        Accurate as of September 25, 2024  8:30 AM. If you have any questions, ask your nurse or doctor.          acetaminophen  500 MG tablet Commonly known as: TYLENOL  Take 2 tablets (1,000 mg total) by mouth every 6 (six) hours as needed (DO NOT EXCEED 4000 MG DAILY FROM ALL SOURCES). What changed: when to take this   AeroChamber MV inhaler Use as instructed   albuterol  108 (90 Base) MCG/ACT inhaler Commonly known as: VENTOLIN  HFA Inhale 2 puffs into the lungs every  6 (six) hours as needed for wheezing or shortness of breath.   alum hydroxide-mag trisilicate 80-20 MG Chew chewable tablet Commonly known as: GAVISCON  Chew 1 tablet by mouth daily as needed for indigestion or heartburn.   budesonide -formoterol  160-4.5 MCG/ACT inhaler Commonly known as: Symbicort  Inhale 2 puffs into the lungs in the morning and at bedtime.   carvedilol  12.5 MG tablet Commonly known as: COREG  Take 1.5 tablets (18.75 mg total) by mouth 2 (two) times daily.   clonazePAM  0.5 MG tablet Commonly known as: KLONOPIN  TAKE 1 TABLET AT BEDTIME AS NEEDED FOR ANXIETY   docusate sodium  100 MG capsule Commonly known as: COLACE Take 100 mg by mouth every other day. What changed:  when to take this reasons to take this   empagliflozin  10 MG Tabs tablet Commonly known as: Jardiance  Take 1 tablet (10 mg total) by mouth daily before breakfast.   furosemide  20 MG tablet Commonly known as: Lasix  Take 1 tablet (20 mg total) by mouth daily as needed (swelling, shortness of breath, gain 3 lbs overnight or 5 lbs in a week).   gabapentin  300  MG capsule Commonly known as: NEURONTIN  TAKE 2 CAPSULES AT BEDTIME   hydrocortisone  2.5 % rectal cream Commonly known as: ANUSOL -HC Place 1 Application rectally 2 (two) times daily.   meclizine  12.5 MG tablet Commonly known as: ANTIVERT  Take 12.5 mg by mouth 3 (three) times daily as needed for dizziness. Dr. Salli   multivitamin tablet Take 1 tablet by mouth every morning.   pantoprazole  40 MG tablet Commonly known as: PROTONIX  Take 1 tablet (40 mg total) by mouth daily.   potassium chloride  10 MEQ tablet Commonly known as: KLOR-CON  M Take 1 tablet (10 mEq total) by mouth as needed (with lasix ).   PROBIOTIC DAILY PO Take 1 capsule by mouth daily.   Repatha  SureClick 140 MG/ML Soaj Generic drug: Evolocumab  Inject 140 mg into the skin every 14 (fourteen) days.   sacubitril -valsartan  97-103 MG Commonly known as: ENTRESTO  Take 1 tablet by mouth 2 (two) times daily.   spironolactone  25 MG tablet Commonly known as: ALDACTONE  TAKE 1 TABLET DAILY   traMADol  50 MG tablet Commonly known as: ULTRAM  Take 50 mg by mouth daily.   VITAMIN D  PO Take 5,000 Units by mouth daily.   zolpidem  5 MG tablet Commonly known as: AMBIEN  Take 1 tablet (5 mg total) by mouth at bedtime.        Allergies: Allergies[1]  Family History: Family History  Problem Relation Age of Onset   Heart failure Mother    COPD Mother    Hypertension Mother    Heart disease Mother    Cancer Father    Heart attack Brother    Heart attack Brother 26   Heart attack Brother    Heart disease Brother    Heart disease Brother    Heart attack Brother    Heart disease Brother     Social History:  reports that she quit smoking about 33 years ago. Her smoking use included cigarettes. She started smoking about 63 years ago. She has a 45 pack-year smoking history. She has never used smokeless tobacco. She reports current alcohol use of about 1.0 standard drink of alcohol per week. She reports that she does  not use drugs.  ROS:  Physical Exam: BP 123/66   Pulse 60   Wt 87.1 kg   SpO2 96%   BMI 40.13 kg/m   Constitutional:  Alert and oriented, No acute distress.   Laboratory Data: Lab Results  Component Value Date   WBC 9.2 08/07/2024   HGB 11.6 08/07/2024   HCT 35.6 08/07/2024   MCV 96 08/07/2024   PLT 261 08/07/2024    Lab Results  Component Value Date   CREATININE 1.55 (H) 08/14/2024    No results found for: PSA  No results found for: TESTOSTERONE  Lab Results  Component Value Date   HGBA1C 5.3 05/05/2024    Urinalysis    Component Value Date/Time   COLORURINE YELLOW (A) 01/22/2021 1335   APPEARANCEUR CLEAR (A) 01/22/2021 1335   LABSPEC 1.016 01/22/2021 1335   PHURINE 5.0 01/22/2021 1335   GLUCOSEU NEGATIVE 01/22/2021 1335   HGBUR NEGATIVE 01/22/2021 1335   BILIRUBINUR NEGATIVE 01/22/2021 1335   KETONESUR NEGATIVE 01/22/2021 1335   PROTEINUR NEGATIVE 01/22/2021 1335   NITRITE NEGATIVE 01/22/2021 1335   LEUKOCYTESUR NEGATIVE 01/22/2021 1335    Pertinent Imaging: No urine  Assessment & Plan: Patient has mild mixed incontinence but primarily urge incontinence.  She will come back for pelvic examination and cystoscopy on Gemtesa  samples and prescription.  Will get a urine culture then.  She may or may not need urodynamics in the future.  Further testing was mentioned without detail She is a Technical Brewer from [place]   There are no diagnoses linked to this encounter.  No follow-ups on file.  Glendia DELENA Elizabeth, MD  [facility] [address] [place], [place] [address] [phone]     [1]  Allergies Allergen Reactions   Azithromycin Hives and Other (See Comments)   Fentanyl  Shortness Of Breath   Sulfa Antibiotics Other (See Comments) and Rash    Hallucinations   Oxycodone  Other (See Comments)    Makes head foggy and interferes with  Meniere's syndrome.     Sulfacetamide    Bovine (Beef) Protein-Containing Drug Products Other (See Comments)    Tears up my stomach.   "

## 2024-09-26 NOTE — Progress Notes (Signed)
 "   09/27/2024 Katrina Poole [id number] 10/24/1945  Gastroenterology Office Note     Primary Care Physician:  Justus Leita DEL, MD  Primary GI Provider: Jinny Carmine, MD    Chief Complaint   Chief Complaint  Patient presents with   Follow-up    Colonoscopy-continues to have diarrhea-taking probiotics-but caused constipation and she stopped-     History of Present Illness   Katrina Poole is a 78 y.o. female with presenting today for follow up.   Patient reports she is still having diarrhea.  On further questioning, she states that she is having loose stools in the morning, typically only 1 loose stool.  He is not having more than 3 loose stools in a day, but states that she is still not back to her baseline of formed stools. She completed 2-week course of Rifaxamin and it helped some.  States that she cannot eat anything in the morning and then leave the house because she knows she is going to have to go to the bathroom as soon as she eats.  She may also then have another bowel movement later in the day that is very soft and mushy.  After her colonoscopy on 09/13/2024 she states that Dr. Clarice recommended that she take prebiotic's and probiotics and she tried that for a few days but then felt like she was having constipation so she stopped it. She denies weight loss, abdominal pain, fever, chills, melena, or hematochezia  Patient last seen by myself on 08/23/2024 due to diarrhea that started on 07/30/2024.   09/13/2024 Colonoscopy - Diverticulosis in the sigmoid colon.  - External and internal hemorrhoids.  - The examined portion of the ileum was normal.  - A single non-bleeding colonic angioectasia.  - Biopsies were taken with a cold forceps from the entire colon for evaluation of microscopic colitis. UNREMARKABLE COLONIC MUCOSA. NEGATIVE FOR MICROSCOPIC COLITIS, DYSPLASIA, AND MALIGNANCY.   Patient seen by PCP on 08/07/2024 with complaints of new onset diarrhea. Stool  pathogen panel negative.  She was started on rifaximin .   08/22/2024 fecal calprotectin elevated 278, celiac negative   06/04/2016 Colonoscopy - One 5 mm polyp in the rectum, removed with a cold snare. Resected and retrieved.  - Diverticulosis in the sigmoid colon and in the distal descending colon.  - A single colonic angioectasia. Treated with argon beam coagulation.  Past Medical History:  Diagnosis Date   Acute urinary retention 02/08/2023   Allergy    Aortic atherosclerosis    Arthritis    osteo of left hip   Asthma    humidity and exercise during summer   CAD (coronary artery disease) 02/08/2023   cath   Cardiomyopathy [facility]) 01/2023   Combined systolic and diastolic heart failure (HCC)    Complication of anesthesia    Pt reports fentanyl  caused her current heart problems   Deafness in right ear    Edema 09/20/2024   Flash pulmonary edema (HCC) 02/08/2023   Generalized anxiety disorder    GERD (gastroesophageal reflux disease)    Heart palpitations    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   Hypertension    controlled on meds   Left bundle branch block    Lumbago of lumbar region with sciatica 06/12/2021   Meniere syndrome    hearing deaf right ear   Mild mitral regurgitation by prior echocardiogram 02/21/2024   Neuromuscular disorder (HCC)    finger tips go numb, neck vertebrae   Obesity  Restless leg syndrome    Spondylolisthesis, lumbosacral region    Status post arthroscopic surgery of left knee 08/15/2015   Vertigo    Wears dentures    full dentures on top, partial bottom    Past Surgical History:  Procedure Laterality Date   ABDOMINAL EXPOSURE N/A 10/09/2022   Procedure: ABDOMINAL EXPOSURE;  Surgeon: Gretta Lonni PARAS, MD;  Location: [facility] OR;  Service: Vascular;  Laterality: N/A;   ANTERIOR CERVICAL DECOMP/DISCECTOMY FUSION N/A 03/16/2018   Procedure: Anterior Cervical Discectomy Fusion - Cervical Three- Cervical Four, Cervical  Four- Cervical Five, Cervical Five-Cervical Six, Cervical Six- Cerival Seven;  Surgeon: Onetha Kuba, MD;  Location: [facility] OR;  Service: Neurosurgery;  Laterality: N/A;  Anterior Cervical Discectomy Fusion - Cervical Three- Cervical Four, Cervical Four- Cervical Five, Cervical Five-Cervical Six, Cervical Six- Ceriva   ANTERIOR LUMBAR FUSION N/A 10/09/2022   Procedure: Anteriorr Lumbar Interbody Fusion - Level Five-Sacral One  with posterior cortical screw augmentation and decompressive laminotomy on the right;  Surgeon: Onetha Kuba, MD;  Location: [facility] OR;  Service: Neurosurgery;  Laterality: N/A;   APPENDECTOMY     BREAST SURGERY     Biopsy   BUNIONECTOMY     Left x1, Right x2.   CARDIAC CATHETERIZATION     x2 both normal   CARPAL TUNNEL RELEASE Right    COLONOSCOPY     COLONOSCOPY N/A 09/13/2024   Procedure: COLONOSCOPY WITH BIOPSY;  Surgeon: Melany Clotilda HERO, MD;  Location: [facility];  Service: Endoscopy;  Laterality: N/A;   COLONOSCOPY WITH PROPOFOL  N/A 06/04/2016   Procedure: COLONOSCOPY WITH PROPOFOL ;  Surgeon: Gladis RAYMOND Mariner, MD;  Location: [facility] END[facility]COPY;  Service: Endoscopy;  Laterality: N/A;   DILATION AND CURETTAGE OF UTERUS     ECTOPIC PREGNANCY SURGERY     ESOPHAGOGASTRODUODENOSCOPY (EGD) WITH PROPOFOL  N/A 06/04/2016   Procedure: ESOPHAGOGASTRODUODENOSCOPY (EGD) WITH PROPOFOL ;  Surgeon: Gladis RAYMOND Mariner, MD;  Location: [facility] END[facility];  Service: Endoscopy;  Laterality: N/A;   exploratory laparoscopy     infertility surgery   EYE SURGERY     Catarac removal   FRACTURE SURGERY     HALLUX VALGUS CORRECTION Bilateral    HARDWARE REMOVAL Left 11/05/2023   Procedure: HARDWARE REMOVAL;  Surgeon: Lennie Barter, DPM;  Location: [facility] ORS;  Service: Orthopedics/Podiatry;  Laterality: Left;   JOINT REPLACEMENT Bilateral    partial knee   KNEE ARTHROPLASTY Left 01/27/2021   Procedure: CONVERSION OF LEFT PARTIAL KNEE TO LEFT TOTAL KNEE.;  Surgeon: Mardee Lynwood SQUIBB, MD;  Location:  [facility] ORS;  Service: Orthopedics;  Laterality: Left;   KNEE ARTHROSCOPY Left 07/19/2015   Procedure: Arthroscopic limited synovectomy along with lateral compartment and retropatellar chondral debridement plus removal of several small loose bodies;  Surgeon: Helayne Glenn, MD;  Location: [facility];  Service: Orthopedics;  Laterality: Left;  1ST CASE PER CECE   LAMINECTOMY WITH POSTERIOR LATERAL ARTHRODESIS LEVEL 2 N/A 10/09/2022   Procedure: LAMINECTOMY WITH POSTERIOR LATERAL ARTHRODESIS LEVEL TWO;  Surgeon: Onetha Kuba, MD;  Location: [facility] OR;  Service: Neurosurgery;  Laterality: N/A;   LEFT HEART CATH AND CORONARY ANGIOGRAPHY Left 02/08/2023   Procedure: LEFT HEART CATH AND CORONARY ANGIOGRAPHY;  Surgeon: Darron Deatrice LABOR, MD;  Location: [facility] INVASIVE CV LAB;  Service: Cardiovascular;  Laterality: Left;   LUMBAR LAMINECTOMY/DECOMPRESSION MICRODISCECTOMY Right 04/29/2020   Procedure: Microdiscectomy - Lumbar Three-Lumbar Four - Lumbar Four-Lumbar Five - right;  Surgeon: Onetha Kuba, MD;  Location: [facility] OR;  Service: Neurosurgery;  Laterality: Right;  Microdiscectomy - Lumbar Three-Lumbar Four - Lumbar Four-Lumbar Five - right   MEDIAL PARTIAL KNEE REPLACEMENT Bilateral    POSTERIOR LUMBAR FUSION  02/2022   L3-4 and L4-5   SHOULDER SURGERY Right    SPINE SURGERY     C3-7 fused   TOTAL HIP ARTHROPLASTY Left 11/25/2015   Procedure: TOTAL HIP ARTHROPLASTY;  Surgeon: Lynwood SHAUNNA Hue, MD;  Location: [facility] ORS;  Service: Orthopedics;  Laterality: Left;   TRIGGER FINGER RELEASE Left     Current Outpatient Medications  Medication Sig Dispense Refill   acetaminophen  (TYLENOL ) 500 MG tablet Take 2 tablets (1,000 mg total) by mouth every 6 (six) hours as needed (DO NOT EXCEED 4000 MG DAILY FROM ALL SOURCES). (Patient taking differently: Take 1,000 mg by mouth daily.) 30 tablet 2   alum hydroxide-mag trisilicate (GAVISCON ) 80-20 MG CHEW chewable tablet Chew 1 tablet by mouth daily as needed for  indigestion or heartburn.     budesonide -formoterol  (SYMBICORT ) 160-4.5 MCG/ACT inhaler Inhale 2 puffs into the lungs in the morning and at bedtime. 1 each 12   carvedilol  (COREG ) 12.5 MG tablet Take 1.5 tablets (18.75 mg total) by mouth 2 (two) times daily. 270 tablet 3   clonazePAM  (KLONOPIN ) 0.5 MG tablet TAKE 1 TABLET AT BEDTIME AS NEEDED FOR ANXIETY 30 tablet 3   empagliflozin  (JARDIANCE ) 10 MG TABS tablet Take 1 tablet (10 mg total) by mouth daily before breakfast. 90 tablet 3   Evolocumab  (REPATHA  SURECLICK) 140 MG/ML SOAJ Inject 140 mg into the skin every 14 (fourteen) days. 2 mL 3   gabapentin  (NEURONTIN ) 300 MG capsule TAKE 2 CAPSULES AT BEDTIME 180 capsule 1   hydrocortisone  (ANUSOL -HC) 2.5 % rectal cream Place 1 Application rectally 2 (two) times daily. 30 g 1   meclizine  (ANTIVERT ) 12.5 MG tablet Take 12.5 mg by mouth 3 (three) times daily as needed for dizziness. Dr. Salli     Multiple Vitamin (MULTIVITAMIN) tablet Take 1 tablet by mouth every morning.     pantoprazole  (PROTONIX ) 40 MG tablet Take 1 tablet (40 mg total) by mouth daily. 90 tablet 1   Probiotic Product (PROBIOTIC DAILY PO) Take 1 capsule by mouth daily.     sacubitril -valsartan  (ENTRESTO ) 97-103 MG Take 1 tablet by mouth 2 (two) times daily. 180 tablet 3   Spacer/Aero-Holding Chambers (AEROCHAMBER MV) inhaler Use as instructed 1 each 0   spironolactone  (ALDACTONE ) 25 MG tablet TAKE 1 TABLET DAILY 90 tablet 3   traMADol  (ULTRAM ) 50 MG tablet Take 50 mg by mouth daily.     Vibegron  (GEMTESA ) 75 MG TABS Take 1 tablet (75 mg total) by mouth daily. 30 tablet 11   VITAMIN D  PO Take 5,000 Units by mouth daily.     zolpidem  (AMBIEN ) 5 MG tablet Take 1 tablet (5 mg total) by mouth at bedtime. 90 tablet 0   No current facility-administered medications for this visit.    Allergies as of 09/27/2024 - Review Complete 09/27/2024  Allergen Reaction Noted   Azithromycin Hives and Other (See Comments) 03/06/2014   Fentanyl   Shortness Of Breath 10/28/2023   Sulfa antibiotics Other (See Comments) and Rash 03/06/2014   Oxycodone  Other (See Comments) 04/22/2020   Sulfacetamide  09/17/2022   Bovine (beef) protein-containing drug products Other (See Comments) 11/13/2015    Family History  Problem Relation Age of Onset   Heart failure Mother    COPD Mother    Hypertension Mother    Heart disease Mother    Cancer Father  Heart attack Brother    Heart attack Brother 49   Heart attack Brother    Heart disease Brother    Heart disease Brother    Heart attack Brother    Heart disease Brother     Social History   Socioeconomic History   Marital status: Married    Spouse name: Francis Coventry   Number of children: 1   Years of education: Not on file   Highest education level: Not on file  Occupational History   Occupation: Part time  Tobacco Use   Smoking status: Former    Current packs/day: 0.00    Average packs/day: 1.5 packs/day for 30.0 years (45.0 ttl pk-yrs)    Types: Cigarettes    Start date: 11/12/1960    Quit date: 11/12/1990    Years since quitting: 33.8   Smokeless tobacco: Never  Vaping Use   Vaping status: Never Used  Substance and Sexual Activity   Alcohol use: Yes    Alcohol/week: 1.0 standard drink of alcohol    Types: 1 Standard drinks or equivalent per week    Comment: OCC [place], maybe 1 drink a week   Drug use: No   Sexual activity: Not Currently  Other Topics Concern   Not on file  Social History Narrative   Not on file   Social Drivers of Health   Tobacco Use: Medium Risk (09/13/2024)   Patient History    Smoking Tobacco Use: Former    Smokeless Tobacco Use: Never    Passive Exposure: Not on file  Financial Resource Strain: Low Risk  (12/21/2023)   Received from [facility]   Overall Financial Resource Strain (CARDIA)    Difficulty of Paying Living Expenses: Not hard at all  Food Insecurity: No Food Insecurity (12/21/2023)   Received from  [facility]   Epic    Within the past 12 months, you worried that your food would run out before you got the money to buy more.: Never true    Within the past 12 months, the food you bought just didn't last and you didn't have money to get more.: Never true  Transportation Needs: No Transportation Needs (12/21/2023)   Received from [facility] - Transportation    In the past 12 months, has lack of transportation kept you from medical appointments or from getting medications?: No    Lack of Transportation (Non-Medical): No  Physical Activity: Insufficiently Active (10/20/2023)   Exercise Vital Sign    Days of Exercise per Week: 5 days    Minutes of Exercise per Session: 20 min  Stress: No Stress Concern Present (10/20/2023)   [employer] of Occupational Health - Occupational Stress Questionnaire    Feeling of Stress : Not at all  Social Connections: Moderately Integrated (10/20/2023)   Social Connection and Isolation Panel    Frequency of Communication with Friends and Family: More than three times a week    Frequency of Social Gatherings with Friends and Family: Once a week    Attends Religious Services: Never    Database Administrator or Organizations: Yes    Attends Engineer, Structural: More than 4 times per year    Marital Status: Married  Catering Manager Violence: Not At Risk (10/20/2023)   Humiliation, Afraid, Rape, and Kick questionnaire    Fear of Current or Ex-Partner: No    Emotionally Abused: No    Physically Abused: No  Sexually Abused: No  Depression (PHQ2-9): Medium Risk (08/07/2024)   Depression (PHQ2-9)    PHQ-2 Score: 9  Alcohol Screen: Low Risk (10/20/2023)   Alcohol Screen    Last Alcohol Screening Score (AUDIT): 1  Housing: Low Risk  (12/21/2023)   Received from [facility]   Epic    In the last 12 months, was there a time when you were not able to pay the mortgage or rent on time?: No     In the past 12 months, how many times have you moved where you were living?: 0    At any time in the past 12 months, were you homeless or living in a shelter (including now)?: No  Utilities: Not At Risk (12/21/2023)   Received from [facility] Utilities    Threatened with loss of utilities: No  Health Literacy: Adequate Health Literacy (10/20/2023)   B1300 Health Literacy    Frequency of need for help with medical instructions: Never     RELEVANT GI HISTORY, IMAGING AND LABS: CBC    Component Value Date/Time   WBC 9.2 08/07/2024 1117   WBC 7.9 08/23/2023 1034   RBC 3.71 (L) 08/07/2024 1117   RBC 3.92 08/23/2023 1034   HGB 11.6 08/07/2024 1117   HCT 35.6 08/07/2024 1117   PLT 261 08/07/2024 1117   MCV 96 08/07/2024 1117   MCH 31.3 08/07/2024 1117   MCH 31.1 08/23/2023 1034   MCHC 32.6 08/07/2024 1117   MCHC 33.1 08/23/2023 1034   RDW 12.4 08/07/2024 1117   LYMPHSABS 0.9 08/07/2024 1117   MONOABS 0.7 08/23/2023 1034   EOSABS 3.4 (H) 08/07/2024 1117   BASOSABS 0.1 08/07/2024 1117   Recent Labs    05/05/24 1001 08/07/24 1117  HGB 11.9 11.6    CMP     Component Value Date/Time   NA 135 08/14/2024 1035   K 5.4 (H) 08/14/2024 1035   CL 100 08/14/2024 1035   CO2 16 (L) 08/14/2024 1035   GLUCOSE 92 08/14/2024 1035   GLUCOSE 101 (H) 04/16/2023 1254   BUN 49 (H) 08/14/2024 1035   CREATININE 1.55 (H) 08/14/2024 1035   CALCIUM  9.1 08/14/2024 1035   PROT 6.3 05/05/2024 1001   ALBUMIN  4.3 05/05/2024 1001   AST 17 05/05/2024 1001   ALT 15 05/05/2024 1001   ALKPHOS 87 05/05/2024 1001   BILITOT 0.4 05/05/2024 1001   GFRNONAA 52 (L) 04/16/2023 1254   GFRAA >60 04/22/2020 0959      Latest Ref Rng & Units 05/05/2024   10:01 AM 04/16/2023   12:54 PM 02/08/2023    1:27 PM  Hepatic Function  Total Protein 6.0 - 8.5 g/dL 6.3  7.0  7.1   Albumin  3.8 - 4.8 g/dL 4.3  4.0  3.9   AST 0 - 40 IU/L 17  17  20    ALT 0 - 32 IU/L 15  14  21    Alk Phosphatase  44 - 121 IU/L 87  81  100   Total Bilirubin 0.0 - 1.2 mg/dL 0.4  0.3  0.8       Review of Systems   All systems reviewed and negative except where noted in HPI.    Physical Exam  BP (!) 144/82   Pulse (!) 53   Temp 97.7 F (36.5 C)   Ht 4' 10 (1.473 m)   Wt 196 lb 9.6 oz (89.2 kg)   SpO2 98%   BMI 41.09 kg/m  No LMP recorded. Patient is postmenopausal. General:   Alert and oriented. Pleasant and cooperative. Well-nourished and well-developed.  NAD. Head:  Normocephalic and atraumatic. Eyes:  Without icterus Ears:  Normal auditory acuity. Neck:  Supple; no masses or thyromegaly. Lungs:  Respirations even and unlabored.  Clear throughout to auscultation.   No wheezes, crackles, or rhonchi. No acute distress. Heart:  Regular rate and rhythm; no murmurs, clicks, rubs, or gallops. Abdomen:  Normal bowel sounds.  No bruits.  Soft, non-tender and non-distended without masses, hepatosplenomegaly or hernias noted.  No guarding or rebound tenderness.  Rectal:  Deferred. Msk:  Symmetrical without gross deformities. Normal posture. Extremities:  Without edema. Neurologic:  Alert and  oriented x4;  grossly normal neurologically. Skin:  Intact without significant lesions or rashes. Psych:  Alert and cooperative. Normal mood and affect.   Assessment & Plan   Katrina Poole is a 78 y.o. female presenting today for follow-up for loose stools.  Patient reports that she is still having loose stools in the morning and then may have softer stools later in the day.  She denies having more than 3 watery stools daily.  She is frustrated that she has to have a bowel movement after breakfast in the morning that is very loose and watery.  She is still not back to her baseline of having formed stools since October.  Recent colonoscopy negative for microscopic colitis. Instructed patient to continue on Benefiber and make sure she is drinking appropriate amount of water  allowed.  I will recheck  calprotectin today and also check fecal elastase.   Follow-up in 2 months with Dr. Melany  Grayce Bohr, DNP, AGNP-C [facility]   "

## 2024-09-27 ENCOUNTER — Ambulatory Visit: Admitting: Family Medicine

## 2024-09-27 ENCOUNTER — Encounter: Payer: Self-pay | Admitting: Family Medicine

## 2024-09-27 VITALS — BP 144/82 | HR 53 | Temp 97.7°F | Ht <= 58 in | Wt 196.6 lb

## 2024-09-27 DIAGNOSIS — R195 Other fecal abnormalities: Secondary | ICD-10-CM

## 2024-09-27 DIAGNOSIS — R197 Diarrhea, unspecified: Secondary | ICD-10-CM | POA: Diagnosis not present

## 2024-09-27 NOTE — Patient Instructions (Signed)
Continue Benefiber

## 2024-10-03 ENCOUNTER — Telehealth: Payer: Self-pay

## 2024-10-03 ENCOUNTER — Other Ambulatory Visit: Payer: Self-pay | Admitting: Internal Medicine

## 2024-10-03 DIAGNOSIS — F5101 Primary insomnia: Secondary | ICD-10-CM

## 2024-10-03 DIAGNOSIS — N3946 Mixed incontinence: Secondary | ICD-10-CM

## 2024-10-03 MED ORDER — OXYBUTYNIN CHLORIDE ER 10 MG PO TB24: 10.0000 mg | ORAL_TABLET | Freq: Every day | ORAL | 11 refills | Status: AC

## 2024-10-03 NOTE — Telephone Encounter (Signed)
 Pt's insurance is not going to cover Gemtesa . Pt has not tried any other OAB medications, please advise.

## 2024-10-03 NOTE — Addendum Note (Signed)
 Addended byBETHA CORIE PLATER on: 10/03/2024 03:26 PM   Modules accepted: Orders

## 2024-10-03 NOTE — Telephone Encounter (Signed)
 Called patient to inform them that the prior authorization for Gemtesa  were denied. Patient was informed that a prescription for Oxybutynin has been sent to her pharmacy. Pt was instructed to notify our office if the medication does not improve their urinary symptoms so we can discuss next steps. Patient voiced understanding.   Pt was informed that their insurance denied coverage due to no hx of PT and not having tried other OAB medications

## 2024-10-04 NOTE — Telephone Encounter (Signed)
 Requested medication (s) are due for refill today: Yes  Requested medication (s) are on the active medication list: Yes  Last refill:  07/13/24  Future visit scheduled: Yes  Notes to clinic:  Unable to refill per protocol, cannot delegate.      Requested Prescriptions  Pending Prescriptions Disp Refills   zolpidem  (AMBIEN ) 5 MG tablet [Pharmacy Med Name: ZOLPIDEM  TARTRATE TABS 5MG ] 90 tablet 0    Sig: TAKE 1 TABLET AT BEDTIME     Not Delegated - Psychiatry:  Anxiolytics/Hypnotics Failed - 10/04/2024  3:06 PM      Failed - This refill cannot be delegated      Failed - Urine Drug Screen completed in last 360 days      Passed - Valid encounter within last 6 months    Recent Outpatient Visits           1 month ago Diarrhea of presumed infectious origin   [facility] at [facility], Leita DEL, MD   2 months ago Stress incontinence of urine   [facility] at [facility], Leita DEL, MD   5 months ago Annual physical exam   [facility] at [facility], Leita DEL, MD

## 2024-10-06 NOTE — Telephone Encounter (Signed)
 Please review.  KP

## 2024-10-10 ENCOUNTER — Ambulatory Visit: Payer: Self-pay | Admitting: Family Medicine

## 2024-10-10 LAB — CALPROTECTIN, FECAL: Calprotectin, Fecal: 148 ug/g — ABNORMAL HIGH (ref 0–120)

## 2024-10-10 LAB — PANCREATIC ELASTASE, FECAL: Pancreatic Elastase, Fecal: >800 ug Elast./g

## 2024-10-17 ENCOUNTER — Ambulatory Visit (INDEPENDENT_AMBULATORY_CARE_PROVIDER_SITE_OTHER): Admitting: Student

## 2024-10-17 ENCOUNTER — Encounter: Payer: Self-pay | Admitting: Student

## 2024-10-17 ENCOUNTER — Telehealth (HOSPITAL_COMMUNITY): Payer: Self-pay

## 2024-10-17 VITALS — BP 118/74 | HR 92 | Ht 59.45 in | Wt 196.0 lb

## 2024-10-17 DIAGNOSIS — R197 Diarrhea, unspecified: Secondary | ICD-10-CM | POA: Diagnosis not present

## 2024-10-17 DIAGNOSIS — I5020 Unspecified systolic (congestive) heart failure: Secondary | ICD-10-CM

## 2024-10-17 DIAGNOSIS — Z6841 Body Mass Index (BMI) 40.0 and over, adult: Secondary | ICD-10-CM | POA: Diagnosis not present

## 2024-10-17 DIAGNOSIS — E782 Mixed hyperlipidemia: Secondary | ICD-10-CM | POA: Diagnosis not present

## 2024-10-17 DIAGNOSIS — J4520 Mild intermittent asthma, uncomplicated: Secondary | ICD-10-CM | POA: Diagnosis not present

## 2024-10-17 DIAGNOSIS — R7303 Prediabetes: Secondary | ICD-10-CM | POA: Diagnosis not present

## 2024-10-17 DIAGNOSIS — I10 Essential (primary) hypertension: Secondary | ICD-10-CM | POA: Diagnosis not present

## 2024-10-17 DIAGNOSIS — F411 Generalized anxiety disorder: Secondary | ICD-10-CM

## 2024-10-17 DIAGNOSIS — F5101 Primary insomnia: Secondary | ICD-10-CM

## 2024-10-17 NOTE — Telephone Encounter (Signed)
 Advanced Heart Failure Patient Advocate Encounter  The patient was approved for a [employer] grant that will help cover the cost of Carvedilol , Entresto , Jardiance , Spironolactone .  Total amount awarded, $7,500.  Effective: 09/17/2024 - 09/16/2025.  BIN [id number] PCN [id number] [id number] ID [id number]  Pharmacy provided with approval and processing information. Patient informed via phone and [employer].  Rachel DEL, CPhT Rx Patient Advocate Phone: [phone]

## 2024-10-17 NOTE — Assessment & Plan Note (Addendum)
 Doing well on symbicort  and albuterol  as needed, follow pulmonology. Continue current medications.

## 2024-10-17 NOTE — Assessment & Plan Note (Signed)
 Normotensive continue entresto , spironolactone  and coreg .

## 2024-10-17 NOTE — Assessment & Plan Note (Addendum)
 Taking clonazepam  and ambien ,  not really endorsing anxitey otherwise. Continue current medications.

## 2024-10-17 NOTE — Progress Notes (Signed)
 "  Established Patient Office Visit  Subjective   Patient ID: Katrina Poole, female    DOB: [birth year]  Age: 79 y.o. MRN: [id number]  Chief Complaint  Patient presents with   Hypertension   Diarrhea    F/up from labs, colonoscopy     Katrina Poole is a 79 y.o. person with medical hx listed below who presents today for hypertension follow up. Please refer to problem based charting for further details and assessment and plan of current problem and chronic medical conditions.   Patient Active Problem List   Diagnosis Date Noted   Diarrhea 09/13/2024   Heart failure with improved ejection fraction (HFimpEF) (HCC) 02/08/2023   CAD (coronary artery disease) 02/08/2023   Left bundle branch block 11/18/2022   Prediabetes 04/22/2022   Spinal stenosis at L4-L5 level 07/21/2021   Heart palpitations 07/01/2021   Mixed hyperlipidemia 07/01/2021   Generalized anxiety disorder 07/01/2021   Body mass index (BMI) 40.0-44.9, adult (HCC) 06/12/2021   Essential hypertension 08/16/2020   Meniere's disease 08/16/2020   Restless leg syndrome 08/16/2020   Primary insomnia 08/16/2020   Mild intermittent asthma without complication 08/16/2020   Diverticulosis of colon 08/16/2020   Gastroesophageal reflux disease without esophagitis 01/24/2020   Osteoarthritis of knee 02/06/2018   S/P total hip arthroplasty 11/25/2015      ROS Refer to HPI    Objective:     Outpatient Encounter Medications as of 10/17/2024  Medication Sig   acetaminophen  (TYLENOL ) 500 MG tablet Take 2 tablets (1,000 mg total) by mouth every 6 (six) hours as needed (DO NOT EXCEED 4000 MG DAILY FROM ALL SOURCES). (Patient taking differently: Take 1,000 mg by mouth daily.)   Bacillus Coagulans-Guar Gum (BENEFIBER ADVANCED PO) Take by mouth.   budesonide -formoterol  (SYMBICORT ) 160-4.5 MCG/ACT inhaler Inhale 2 puffs into the lungs in the morning and at bedtime.   carvedilol  (COREG ) 12.5 MG tablet Take 1.5 tablets (18.75 mg  total) by mouth 2 (two) times daily.   clonazePAM  (KLONOPIN ) 0.5 MG tablet TAKE 1 TABLET AT BEDTIME AS NEEDED FOR ANXIETY   empagliflozin  (JARDIANCE ) 10 MG TABS tablet Take 1 tablet (10 mg total) by mouth daily before breakfast.   Evolocumab  (REPATHA  SURECLICK) 140 MG/ML SOAJ Inject 140 mg into the skin every 14 (fourteen) days.   gabapentin  (NEURONTIN ) 300 MG capsule TAKE 2 CAPSULES AT BEDTIME   hydrocortisone  (ANUSOL -HC) 2.5 % rectal cream Place 1 Application rectally 2 (two) times daily.   meclizine  (ANTIVERT ) 12.5 MG tablet Take 12.5 mg by mouth 3 (three) times daily as needed for dizziness. Dr. Salli   Multiple Vitamin (MULTIVITAMIN) tablet Take 1 tablet by mouth every morning.   oxybutynin  (DITROPAN -XL) 10 MG 24 hr tablet Take 1 tablet (10 mg total) by mouth daily.   pantoprazole  (PROTONIX ) 40 MG tablet Take 1 tablet (40 mg total) by mouth daily.   sacubitril -valsartan  (ENTRESTO ) 97-103 MG Take 1 tablet by mouth 2 (two) times daily.   Spacer/Aero-Holding Chambers (AEROCHAMBER MV) inhaler Use as instructed   spironolactone  (ALDACTONE ) 25 MG tablet TAKE 1 TABLET DAILY   VITAMIN D  PO Take 5,000 Units by mouth daily.   zolpidem  (AMBIEN ) 5 MG tablet TAKE 1 TABLET AT BEDTIME   [DISCONTINUED] alum hydroxide-mag trisilicate (GAVISCON ) 80-20 MG CHEW chewable tablet Chew 1 tablet by mouth daily as needed for indigestion or heartburn.   [DISCONTINUED] traMADol  (ULTRAM ) 50 MG tablet Take 50 mg by mouth daily.   Probiotic Product (PROBIOTIC DAILY PO) Take 1 capsule by mouth daily. (  Patient not taking: Reported on 10/17/2024)   No facility-administered encounter medications on file as of 10/17/2024.    BP 118/74   Pulse 92   Ht 4' 11.45 (1.51 m)   Wt 196 lb (88.9 kg)   SpO2 99%   BMI 38.99 kg/m  BP Readings from Last 3 Encounters:  10/17/24 118/74  09/27/24 (!) 144/82  09/25/24 123/66    Physical Exam Constitutional:      Appearance: Normal appearance.  HENT:     Mouth/Throat:      Mouth: Mucous membranes are moist.     Pharynx: Oropharynx is clear.  Eyes:     Extraocular Movements: Extraocular movements intact.     Pupils: Pupils are equal, round, and reactive to light.  Cardiovascular:     Rate and Rhythm: Normal rate and regular rhythm.     Heart sounds: No murmur heard. Pulmonary:     Effort: Pulmonary effort is normal.     Breath sounds: No rhonchi or rales.  Abdominal:     General: Abdomen is flat. Bowel sounds are normal. There is no distension.     Palpations: Abdomen is soft.     Tenderness: There is no abdominal tenderness.  Musculoskeletal:        General: Normal range of motion.     Right lower leg: No edema.     Left lower leg: No edema.  Skin:    General: Skin is warm and dry.     Capillary Refill: Capillary refill takes less than 2 seconds.  Neurological:     General: No focal deficit present.     Mental Status: She is alert and oriented to person, place, and time.  Psychiatric:        Mood and Affect: Mood normal.        Behavior: Behavior normal.        08/07/2024   10:25 AM 07/17/2024   11:14 AM 04/14/2024    8:13 AM  Depression screen PHQ 2/9  Decreased Interest 3 0 0  Down, Depressed, Hopeless 0 0 0  PHQ - 2 Score 3 0 0  Altered sleeping 3 0 1  Tired, decreased energy 3 0 1  Change in appetite 0 0 0  Feeling bad or failure about yourself  0 0 0  Trouble concentrating 0 0 0  Moving slowly or fidgety/restless 0 0 0  Suicidal thoughts 0 0 0  PHQ-9 Score 9  0  2   Difficult doing work/chores Not difficult at all Not difficult at all Not difficult at all     Data saved with a previous flowsheet row definition       08/07/2024   10:25 AM 07/17/2024   11:14 AM 04/14/2024    8:13 AM 10/12/2023    9:18 AM  GAD 7 : Generalized Anxiety Score  Nervous, Anxious, on Edge 0  0  0  0   Control/stop worrying 0  0  0  0   Worry too much - different things 0  0  0  0   Trouble relaxing 0  0  0  0   Restless 0  0  1  0   Easily annoyed  or irritable 0  0  0  0   Afraid - awful might happen 0  0  0  0   Total GAD 7 Score 0 0 1 0  Anxiety Difficulty Not difficult at all Not difficult at all Not difficult at all Not  difficult at all     Data saved with a previous flowsheet row definition    No results found for any visits on 10/17/24.  Last CBC Lab Results  Component Value Date   WBC 9.2 08/07/2024   HGB 11.6 08/07/2024   HCT 35.6 08/07/2024   MCV 96 08/07/2024   MCH 31.3 08/07/2024   RDW 12.4 08/07/2024   PLT 261 08/07/2024   Last metabolic panel Lab Results  Component Value Date   GLUCOSE 92 08/14/2024   NA 135 08/14/2024   K 5.4 (H) 08/14/2024   CL 100 08/14/2024   CO2 16 (L) 08/14/2024   BUN 49 (H) 08/14/2024   CREATININE 1.55 (H) 08/14/2024   EGFR 35 (L) 08/14/2024   CALCIUM  9.1 08/14/2024   PROT 6.3 05/05/2024   ALBUMIN  4.3 05/05/2024   LABGLOB 2.0 05/05/2024   AGRATIO 2.0 10/23/2021   BILITOT 0.4 05/05/2024   ALKPHOS 87 05/05/2024   AST 17 05/05/2024   ALT 15 05/05/2024   ANIONGAP 9 04/16/2023   Last lipids Lab Results  Component Value Date   CHOL 120 10/21/2023   HDL 53 10/21/2023   LDLCALC 50 10/21/2023   TRIG 87 10/21/2023   CHOLHDL 2.3 10/21/2023   Last hemoglobin A1c Lab Results  Component Value Date   HGBA1C 5.3 05/05/2024   Last thyroid  functions Lab Results  Component Value Date   TSH 3.050 05/05/2024   FREET4 1.14 07/01/2021   Last vitamin B12 and Folate Lab Results  Component Value Date   VITAMINB12 522 07/01/2021      The ASCVD Risk score (Arnett DK, et al., 2019) failed to calculate for the following reasons:   The valid total cholesterol range is 130 to 320 mg/dL    Assessment & Plan:  Body mass index (BMI) 40.0-44.9, adult (HCC) Assessment & Plan: Not currently on GLPs due to lack of weight loss   Essential hypertension Assessment & Plan: Normotensive continue entresto , spironolactone  and coreg .    Mild intermittent asthma without  complication Assessment & Plan: Doing well on symbicort  and albuterol  as needed, follow pulmonology. Continue current medications.    Prediabetes Assessment & Plan: Lab Results  Component Value Date   HGBA1C 5.3 05/05/2024   HGBA1C 5.5 07/06/2023   HGBA1C 5.4 02/08/2023  Is on Jardiance  for HFimEF, no weight loss on zepbound  now off.  Repeat at 12 months.     Primary insomnia Assessment & Plan: Taking clonazepam  and ambien ,  not really endorsing anxitey otherwise. Continue current medications.    Heart failure with improved ejection fraction (HFimpEF) (HCC) Assessment & Plan: Feeling well today, currently taking spironolactone  25 mg daily, entresto  97-103 mg twice daily, carvedilol  12.5 mg twice daily, Jardiance . Euvolemic today. Follow with Dr. Rolan   Diarrhea, unspecified type Assessment & Plan: Currently seeing GI for this, recommended stopping magnesium     Generalized anxiety disorder Assessment & Plan: Stable on clonazepam  0.5 mg daily as needed and ambien  for sleep. Will discuss further at another time regarding taking PRN rather than most days.   Mixed hyperlipidemia Assessment & Plan: Well controlled on repatha        Return in about 6 months (around 04/16/2025) for physical.    Harlene Saddler, MD "

## 2024-10-17 NOTE — Assessment & Plan Note (Addendum)
 Lab Results  Component Value Date   HGBA1C 5.3 05/05/2024   HGBA1C 5.5 07/06/2023   HGBA1C 5.4 02/08/2023  Is on Jardiance  for HFimEF, no weight loss on zepbound  now off.  Repeat at 12 months.

## 2024-10-17 NOTE — Assessment & Plan Note (Signed)
 Not currently on GLPs due to lack of weight loss

## 2024-10-18 ENCOUNTER — Other Ambulatory Visit (HOSPITAL_COMMUNITY): Payer: Self-pay

## 2024-10-20 ENCOUNTER — Encounter: Admitting: Student

## 2024-10-24 NOTE — Assessment & Plan Note (Signed)
 Feeling well today, currently taking spironolactone  25 mg daily, entresto  97-103 mg twice daily, carvedilol  12.5 mg twice daily, Jardiance . Euvolemic today. Follow with Dr. Rolan

## 2024-10-24 NOTE — Assessment & Plan Note (Signed)
 Well controlled on repatha 

## 2024-10-24 NOTE — Assessment & Plan Note (Signed)
 Currently seeing GI for this, recommended stopping magnesium 

## 2024-10-24 NOTE — Assessment & Plan Note (Signed)
 Stable on clonazepam  0.5 mg daily as needed and ambien  for sleep. Will discuss further at another time regarding taking PRN rather than most days.

## 2024-11-01 ENCOUNTER — Telehealth: Payer: Self-pay | Admitting: Urology

## 2024-11-01 ENCOUNTER — Telehealth: Payer: Self-pay

## 2024-11-01 DIAGNOSIS — N3946 Mixed incontinence: Secondary | ICD-10-CM

## 2024-11-01 NOTE — Telephone Encounter (Signed)
 Pt move to 2pm tomorrow after a cancelation, nothing further needed

## 2024-11-01 NOTE — Telephone Encounter (Signed)
 Patient called to let Dr. Gaston know that the Oxybutnin is not working. She has been taking it for about 3 weeks, and has one week's worth left. She said Dr. MacDiarmid said if it wasn't helping, he could prescribe something else, and she would like to try a different medication. Pharmacy is [facility] in [place] on [address].

## 2024-11-01 NOTE — Telephone Encounter (Signed)
 Per pt she really wants to be seen by md sooner. Pt appt is on  11-30-2024 .  She has chronic diarrhea. She thinks this a a repeat from Oct.  Pt states she saw Grayce and was referred to Dr. Melany. Pt states this started  Jan.12th, Pt said she didn't tell her PCP about it since she had seen Robin.

## 2024-11-02 ENCOUNTER — Ambulatory Visit

## 2024-11-02 ENCOUNTER — Ambulatory Visit (INDEPENDENT_AMBULATORY_CARE_PROVIDER_SITE_OTHER): Admitting: Gastroenterology

## 2024-11-02 VITALS — BP 132/77 | HR 50 | Temp 98.1°F | Wt 196.0 lb

## 2024-11-02 DIAGNOSIS — R197 Diarrhea, unspecified: Secondary | ICD-10-CM

## 2024-11-02 NOTE — Progress Notes (Signed)
 "   11/02/2024 Katrina Poole [id number] [birth year]  Gastroenterology Office Note     Primary Care Physician:  Lemon Raisin, MD  Primary GI Provider: Jinny Carmine, MD    Chief Complaint   Chief Complaint  Patient presents with   Diarrhea    Pt reports she tried prebiotics and probiotics for a few days, the she was having constipation so she stopped it.   She has continued prebiotics and benefiber     History of Present Illness   11/03/23 -Katrina Poole reports in follow-up for discussion about diarrhea.  She had the onset of loose morning stools in October 2024.  She was treated with rifaximin  in November 2025 with mild improvement in symptoms after 2 weeks of therapy.  Her fecal calprotectin normalized from 278-148.  Use of this medication was a hardship because of the high cost.  She underwent colonoscopy with me in December 2025, when random biopsies for microscopic colitis were negative. She has been taking Benefiber 2 tablespoons daily but has not noted much improvement.  She has a loose or watery bowel movement every morning.  In addition to testing negative for microscopic colitis, she has been negative for alpha gal and celiac.  She consumes very little dairy, and dairy consumption is not related to symptom experience.  Gas is not a feature.  She does have her gallbladder.  Weight is unchanged.  09/27/24 - (Appt with Katrina Poole) Katrina Poole is a 79 y.o. female with presenting today for follow up.   Patient reports she is still having diarrhea.  On further questioning, she states that she is having loose stools in the morning, typically only 1 loose stool.  He is not having more than 3 loose stools in a day, but states that she is still not back to her baseline of formed stools. She completed 2-week course of Rifaxamin and it helped some.  States that she cannot eat anything in the morning and then leave the house because she knows she is going to have to go  to the bathroom as soon as she eats.  She may also then have another bowel movement later in the day that is very soft and mushy.  After her colonoscopy on 09/13/2024 she states that Dr. Clarice recommended that she take prebiotic's and probiotics and she tried that for a few days but then felt like she was having constipation so she stopped it. She denies weight loss, abdominal pain, fever, chills, melena, or hematochezia  Patient last seen by myself on 08/23/2024 due to diarrhea that started on 07/30/2024.   09/13/2024 Colonoscopy - Diverticulosis in the sigmoid colon.  - External and internal hemorrhoids.  - The examined portion of the ileum was normal.  - A single non-bleeding colonic angioectasia.  - Biopsies were taken with a cold forceps from the entire colon for evaluation of microscopic colitis. UNREMARKABLE COLONIC MUCOSA. NEGATIVE FOR MICROSCOPIC COLITIS, DYSPLASIA, AND MALIGNANCY.   Patient seen by PCP on 08/07/2024 with complaints of new onset diarrhea. Stool pathogen panel negative.  She was started on rifaximin .   08/22/2024 fecal calprotectin elevated 278, celiac negative   06/04/2016 Colonoscopy - One 5 mm polyp in the rectum, removed with a cold snare. Resected and retrieved.  - Diverticulosis in the sigmoid colon and in the distal descending colon.  - A single colonic angioectasia. Treated with argon beam coagulation.  Past Medical History:  Diagnosis Date   Acute urinary retention 02/08/2023   Allergy  Aortic atherosclerosis    Arthritis    osteo of left hip   Asthma    humidity and exercise during summer   CAD (coronary artery disease) 02/08/2023   cath   Cardiomyopathy [facility]) 01/2023   Combined systolic and diastolic heart failure (HCC)    Complication of anesthesia    Pt reports fentanyl  caused her current heart problems   Deafness in right ear    Edema 09/20/2024   Flash pulmonary edema (HCC) 02/08/2023   Generalized anxiety disorder    GERD  (gastroesophageal reflux disease)    Heart palpitations    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   Hypertension    controlled on meds   Left bundle branch block    Lumbago of lumbar region with sciatica 06/12/2021   Meniere syndrome    hearing deaf right ear   Mild mitral regurgitation by prior echocardiogram 02/21/2024   Neuromuscular disorder (HCC)    finger tips go numb, neck vertebrae   Obesity    Restless leg syndrome    Spondylolisthesis, lumbosacral region    Status post arthroscopic surgery of left knee 08/15/2015   Vertigo    Wears dentures    full dentures on top, partial bottom    Past Surgical History:  Procedure Laterality Date   ABDOMINAL EXPOSURE N/A 10/09/2022   Procedure: ABDOMINAL EXPOSURE;  Surgeon: Gretta Lonni PARAS, MD;  Location: [facility] OR;  Service: Vascular;  Laterality: N/A;   ANTERIOR CERVICAL DECOMP/DISCECTOMY FUSION N/A 03/16/2018   Procedure: Anterior Cervical Discectomy Fusion - Cervical Three- Cervical Four, Cervical Four- Cervical Five, Cervical Five-Cervical Six, Cervical Six- Cerival Seven;  Surgeon: Onetha Kuba, MD;  Location: [facility] OR;  Service: Neurosurgery;  Laterality: N/A;  Anterior Cervical Discectomy Fusion - Cervical Three- Cervical Four, Cervical Four- Cervical Five, Cervical Five-Cervical Six, Cervical Six- Ceriva   ANTERIOR LUMBAR FUSION N/A 10/09/2022   Procedure: Anteriorr Lumbar Interbody Fusion - Level Five-Sacral One  with posterior cortical screw augmentation and decompressive laminotomy on the right;  Surgeon: Onetha Kuba, MD;  Location: [facility] OR;  Service: Neurosurgery;  Laterality: N/A;   APPENDECTOMY     BREAST SURGERY     Biopsy   BUNIONECTOMY     Left x1, Right x2.   CARDIAC CATHETERIZATION     x2 both normal   CARPAL TUNNEL RELEASE Right    COLONOSCOPY     COLONOSCOPY N/A 09/13/2024   Procedure: COLONOSCOPY WITH BIOPSY;  Surgeon: Melany Clotilda HERO, MD;  Location: [facility];  Service: Endoscopy;   Laterality: N/A;   COLONOSCOPY WITH PROPOFOL  N/A 06/04/2016   Procedure: COLONOSCOPY WITH PROPOFOL ;  Surgeon: Gladis RAYMOND Mariner, MD;  Location: [facility] ENDOSCOPY;  Service: Endoscopy;  Laterality: N/A;   DILATION AND CURETTAGE OF UTERUS     ECTOPIC PREGNANCY SURGERY     ESOPHAGOGASTRODUODENOSCOPY (EGD) WITH PROPOFOL  N/A 06/04/2016   Procedure: ESOPHAGOGASTRODUODENOSCOPY (EGD) WITH PROPOFOL ;  Surgeon: Gladis RAYMOND Mariner, MD;  Location: [facility] ENDOSCOPY;  Service: Endoscopy;  Laterality: N/A;   exploratory laparoscopy     infertility surgery   EYE SURGERY     Catarac removal   FRACTURE SURGERY     HALLUX VALGUS CORRECTION Bilateral    HARDWARE REMOVAL Left 11/05/2023   Procedure: HARDWARE REMOVAL;  Surgeon: Lennie Barter, DPM;  Location: [facility] ORS;  Service: Orthopedics/Podiatry;  Laterality: Left;   JOINT REPLACEMENT Bilateral    partial knee   KNEE ARTHROPLASTY Left 01/27/2021   Procedure: CONVERSION OF LEFT PARTIAL KNEE TO LEFT  TOTAL KNEE.;  Surgeon: Mardee Lynwood SQUIBB, MD;  Location: [facility] ORS;  Service: Orthopedics;  Laterality: Left;   KNEE ARTHROSCOPY Left 07/19/2015   Procedure: Arthroscopic limited synovectomy along with lateral compartment and retropatellar chondral debridement plus removal of several small loose bodies;  Surgeon: Helayne Glenn, MD;  Location: [facility];  Service: Orthopedics;  Laterality: Left;  1ST CASE PER CECE   LAMINECTOMY WITH POSTERIOR LATERAL ARTHRODESIS LEVEL 2 N/A 10/09/2022   Procedure: LAMINECTOMY WITH POSTERIOR LATERAL ARTHRODESIS LEVEL TWO;  Surgeon: Onetha Kuba, MD;  Location: [facility] OR;  Service: Neurosurgery;  Laterality: N/A;   LEFT HEART CATH AND CORONARY ANGIOGRAPHY Left 02/08/2023   Procedure: LEFT HEART CATH AND CORONARY ANGIOGRAPHY;  Surgeon: Darron Deatrice LABOR, MD;  Location: [facility] INVASIVE CV LAB;  Service: Cardiovascular;  Laterality: Left;   LUMBAR LAMINECTOMY/DECOMPRESSION MICRODISCECTOMY Right 04/29/2020   Procedure: Microdiscectomy - Lumbar  Three-Lumbar Four - Lumbar Four-Lumbar Five - right;  Surgeon: Onetha Kuba, MD;  Location: [facility] OR;  Service: Neurosurgery;  Laterality: Right;  Microdiscectomy - Lumbar Three-Lumbar Four - Lumbar Four-Lumbar Five - right   MEDIAL PARTIAL KNEE REPLACEMENT Bilateral    POSTERIOR LUMBAR FUSION  02/2022   L3-4 and L4-5   SHOULDER SURGERY Right    SPINE SURGERY     C3-7 fused   TOTAL HIP ARTHROPLASTY Left 11/25/2015   Procedure: TOTAL HIP ARTHROPLASTY;  Surgeon: Lynwood SQUIBB Mardee, MD;  Location: [facility] ORS;  Service: Orthopedics;  Laterality: Left;   TRIGGER FINGER RELEASE Left     Current Outpatient Medications  Medication Sig Dispense Refill   acetaminophen  (TYLENOL ) 500 MG tablet Take 2 tablets (1,000 mg total) by mouth every 6 (six) hours as needed (DO NOT EXCEED 4000 MG DAILY FROM ALL SOURCES). (Patient taking differently: Take 1,000 mg by mouth daily.) 30 tablet 2   Bacillus Coagulans-Guar Gum (BENEFIBER ADVANCED PO) Take by mouth.     budesonide -formoterol  (SYMBICORT ) 160-4.5 MCG/ACT inhaler Inhale 2 puffs into the lungs in the morning and at bedtime. 1 each 12   carvedilol  (COREG ) 12.5 MG tablet Take 1.5 tablets (18.75 mg total) by mouth 2 (two) times daily. 270 tablet 3   clonazePAM  (KLONOPIN ) 0.5 MG tablet TAKE 1 TABLET AT BEDTIME AS NEEDED FOR ANXIETY 30 tablet 3   empagliflozin  (JARDIANCE ) 10 MG TABS tablet Take 1 tablet (10 mg total) by mouth daily before breakfast. 90 tablet 3   Evolocumab  (REPATHA  SURECLICK) 140 MG/ML SOAJ Inject 140 mg into the skin every 14 (fourteen) days. 2 mL 3   gabapentin  (NEURONTIN ) 300 MG capsule TAKE 2 CAPSULES AT BEDTIME 180 capsule 1   hydrocortisone  (ANUSOL -HC) 2.5 % rectal cream Place 1 Application rectally 2 (two) times daily. 30 g 1   meclizine  (ANTIVERT ) 12.5 MG tablet Take 12.5 mg by mouth 3 (three) times daily as needed for dizziness. Dr. Salli     Multiple Vitamin (MULTIVITAMIN) tablet Take 1 tablet by mouth every morning.     oxybutynin  (DITROPAN -XL)  10 MG 24 hr tablet Take 1 tablet (10 mg total) by mouth daily. 30 tablet 11   pantoprazole  (PROTONIX ) 40 MG tablet Take 1 tablet (40 mg total) by mouth daily. 90 tablet 1   Probiotic Product (PROBIOTIC DAILY PO) Take 1 capsule by mouth daily.     sacubitril -valsartan  (ENTRESTO ) 97-103 MG Take 1 tablet by mouth 2 (two) times daily. 180 tablet 3   Spacer/Aero-Holding Chambers (AEROCHAMBER MV) inhaler Use as instructed 1 each 0   spironolactone  (ALDACTONE ) 25 MG tablet TAKE  1 TABLET DAILY 90 tablet 3   VITAMIN D  PO Take 5,000 Units by mouth daily.     zolpidem  (AMBIEN ) 5 MG tablet TAKE 1 TABLET AT BEDTIME 30 tablet 0   No current facility-administered medications for this visit.    Allergies as of 11/02/2024 - Review Complete 11/02/2024  Allergen Reaction Noted   Azithromycin Hives and Other (See Comments) 03/06/2014   Fentanyl  Shortness Of Breath 10/28/2023   Sulfa antibiotics Other (See Comments) and Rash 03/06/2014   Oxycodone  Other (See Comments) 04/22/2020   Sulfacetamide  09/17/2022   Bovine (beef) protein-containing drug products Other (See Comments) 11/13/2015    Family History  Problem Relation Age of Onset   Heart failure Mother    COPD Mother    Hypertension Mother    Heart disease Mother    Cancer Father    Heart attack Brother    Heart attack Brother 40   Heart attack Brother    Heart disease Brother    Heart disease Brother    Heart attack Brother    Heart disease Brother     Social History   Socioeconomic History   Marital status: Married    Spouse name: Francis Coventry   Number of children: 1   Years of education: Not on file   Highest education level: GED or equivalent  Occupational History   Occupation: Part time  Tobacco Use   Smoking status: Former    Current packs/day: 0.00    Average packs/day: 1.5 packs/day for 30.0 years (45.0 ttl pk-yrs)    Types: Cigarettes    Start date: 11/12/1960    Quit date: 11/12/1990    Years since quitting: 33.9    Smokeless tobacco: Never  Vaping Use   Vaping status: Never Used  Substance and Sexual Activity   Alcohol use: Yes    Alcohol/week: 1.0 standard drink of alcohol    Types: 1 Standard drinks or equivalent per week    Comment: OCC [place], maybe 1 drink a week   Drug use: No   Sexual activity: Not Currently  Other Topics Concern   Not on file  Social History Narrative   Not on file   Social Drivers of Health   Tobacco Use: Medium Risk (10/17/2024)   Patient History    Smoking Tobacco Use: Former    Smokeless Tobacco Use: Never    Passive Exposure: Not on file  Financial Resource Strain: Low Risk (10/16/2024)   Overall Financial Resource Strain (CARDIA)    Difficulty of Paying Living Expenses: Not very hard  Food Insecurity: No Food Insecurity (10/16/2024)   Epic    Worried About Programme Researcher, Broadcasting/film/video in the Last Year: Never true    Ran Out of Food in the Last Year: Never true  Transportation Needs: No Transportation Needs (10/16/2024)   Epic    Lack of Transportation (Medical): No    Lack of Transportation (Non-Medical): No  Physical Activity: Inactive (10/16/2024)   Exercise Vital Sign    Days of Exercise per Week: 0 days    Minutes of Exercise per Session: Not on file  Stress: No Stress Concern Present (10/16/2024)   [employer] of Occupational Health - Occupational Stress Questionnaire    Feeling of Stress: Not at all  Social Connections: Moderately Integrated (10/16/2024)   Social Connection and Isolation Panel    Frequency of Communication with Friends and Family: More than three times a week    Frequency of Social Gatherings with Friends  and Family: Once a week    Attends Religious Services: Never    Active Member of Clubs or Organizations: Yes    Attends Banker Meetings: More than 4 times per year    Marital Status: Married  Catering Manager Violence: Not At Risk (10/20/2023)   Humiliation, Afraid, Rape, and Kick questionnaire    Fear of  Current or Ex-Partner: No    Emotionally Abused: No    Physically Abused: No    Sexually Abused: No  Depression (PHQ2-9): Medium Risk (08/07/2024)   Depression (PHQ2-9)    PHQ-2 Score: 9  Alcohol Screen: Low Risk (10/16/2024)   Alcohol Screen    Last Alcohol Screening Score (AUDIT): 1  Housing: Low Risk (10/16/2024)   Epic    Unable to Pay for Housing in the Last Year: No    Number of Times Moved in the Last Year: 0    Homeless in the Last Year: No  Utilities: Not At Risk (12/21/2023)   Received from [facility]    Threatened with loss of utilities: No  Health Literacy: Adequate Health Literacy (10/20/2023)   B1300 Health Literacy    Frequency of need for help with medical instructions: Never     RELEVANT GI HISTORY, IMAGING AND LABS: CBC    Component Value Date/Time   WBC 9.2 08/07/2024 1117   WBC 7.9 08/23/2023 1034   RBC 3.71 (L) 08/07/2024 1117   RBC 3.92 08/23/2023 1034   HGB 11.6 08/07/2024 1117   HCT 35.6 08/07/2024 1117   PLT 261 08/07/2024 1117   MCV 96 08/07/2024 1117   MCH 31.3 08/07/2024 1117   MCH 31.1 08/23/2023 1034   MCHC 32.6 08/07/2024 1117   MCHC 33.1 08/23/2023 1034   RDW 12.4 08/07/2024 1117   LYMPHSABS 0.9 08/07/2024 1117   MONOABS 0.7 08/23/2023 1034   EOSABS 3.4 (H) 08/07/2024 1117   BASOSABS 0.1 08/07/2024 1117   Recent Labs    05/05/24 1001 08/07/24 1117  HGB 11.9 11.6    CMP     Component Value Date/Time   NA 135 08/14/2024 1035   K 5.4 (H) 08/14/2024 1035   CL 100 08/14/2024 1035   CO2 16 (L) 08/14/2024 1035   GLUCOSE 92 08/14/2024 1035   GLUCOSE 101 (H) 04/16/2023 1254   BUN 49 (H) 08/14/2024 1035   CREATININE 1.55 (H) 08/14/2024 1035   CALCIUM  9.1 08/14/2024 1035   PROT 6.3 05/05/2024 1001   ALBUMIN  4.3 05/05/2024 1001   AST 17 05/05/2024 1001   ALT 15 05/05/2024 1001   ALKPHOS 87 05/05/2024 1001   BILITOT 0.4 05/05/2024 1001   GFRNONAA 52 (L) 04/16/2023 1254   GFRAA >60 04/22/2020 0959       Latest Ref Rng & Units 05/05/2024   10:01 AM 04/16/2023   12:54 PM 02/08/2023    1:27 PM  Hepatic Function  Total Protein 6.0 - 8.5 g/dL 6.3  7.0  7.1   Albumin  3.8 - 4.8 g/dL 4.3  4.0  3.9   AST 0 - 40 IU/L 17  17  20    ALT 0 - 32 IU/L 15  14  21    Alk Phosphatase 44 - 121 IU/L 87  81  100   Total Bilirubin 0.0 - 1.2 mg/dL 0.4  0.3  0.8       Review of Systems   All systems reviewed and negative except where noted in HPI.    Physical Exam  BP 132/77 (BP Location: Left Arm, Patient Position: Sitting, Cuff Size: Large)   Pulse (!) 50   Temp 98.1 F (36.7 C) (Oral)   Wt 196 lb (88.9 kg)   BMI 38.99 kg/m  No LMP recorded. Patient is postmenopausal. General:   Alert and oriented. Pleasant and cooperative. Well-nourished and well-developed.  NAD. Head:  Normocephalic and atraumatic. Eyes:  Without icterus Ears:  Normal auditory acuity. Neck:  Supple; no masses or thyromegaly. Lungs:  Respirations even and unlabored.  Clear throughout to auscultation.   No wheezes, crackles, or rhonchi. No acute distress. Heart:  Regular rate and rhythm; no murmurs, clicks, rubs, or gallops. Abdomen:  Normal bowel sounds.  No bruits.  Soft, non-tender and non-distended without masses, hepatosplenomegaly or hernias noted.  No guarding or rebound tenderness.  Rectal:  Deferred. Msk:  Symmetrical without gross deformities. Normal posture. Extremities:  Without edema. Neurologic:  Alert and  oriented x4;  grossly normal neurologically. Skin:  Intact without significant lesions or rashes. Psych:  Alert and cooperative. Normal mood and affect.   Assessment & Plan   I recommended that we continue Benefiber and add 1/2 tablet of Imodium at bedtime and first thing in the morning. Follow-up in 4 weeks and if symptoms persist, we can try a prescription medication such as dicyclomine 20 mg 4 times daily.  Normalization of fecal calprotectin suggest that she may have had an infection that has now  resolved.  Hopefully she just needs a little more time for her stools to normalize.  We did discuss discontinuation of coffee in the morning, as this is a known colon stimulant. Her symptoms are mild, and gas is not a feature, so I think use of rifaximin  is not absolutely indicated.   "

## 2024-11-08 ENCOUNTER — Other Ambulatory Visit: Payer: Self-pay | Admitting: Student

## 2024-11-08 DIAGNOSIS — F5101 Primary insomnia: Secondary | ICD-10-CM

## 2024-11-08 MED ORDER — SOLIFENACIN SUCCINATE 5 MG PO TABS: 5.0000 mg | ORAL_TABLET | Freq: Every day | ORAL | 11 refills | Status: AC

## 2024-11-08 NOTE — Addendum Note (Signed)
 Addended by: CLEOTILDE HELLER L on: 11/08/2024 01:00 PM   Modules accepted: Orders

## 2024-11-08 NOTE — Telephone Encounter (Signed)
 Sent in Vesicare  5 mg 30x11

## 2024-11-09 ENCOUNTER — Ambulatory Visit

## 2024-11-10 NOTE — Telephone Encounter (Signed)
 Requested medication (s) are due for refill today: yes  Requested medication (s) are on the active medication list: yes  Last refill:  10/06/24 #30  Future visit scheduled: no  Notes to clinic:  med not delegated to NT to RF   Requested Prescriptions  Pending Prescriptions Disp Refills   zolpidem  (AMBIEN ) 5 MG tablet [Pharmacy Med Name: ZOLPIDEM  TARTRATE TABS 5MG ] 30 tablet 0    Sig: TAKE 1 TABLET AT BEDTIME     Not Delegated - Psychiatry:  Anxiolytics/Hypnotics Failed - 11/10/2024 10:02 AM      Failed - This refill cannot be delegated      Failed - Urine Drug Screen completed in last 360 days      Passed - Valid encounter within last 6 months    Recent Outpatient Visits           3 weeks ago Body mass index (BMI) 40.0-44.9, adult [facility])    Primary Care & Sports Medicine at [facility], MD   3 months ago Diarrhea of presumed infectious origin   [facility] Primary Care & Sports Medicine at [facility], Leita DEL, MD   3 months ago Stress incontinence of urine   [facility] Primary Care & Sports Medicine at [facility], Leita DEL, MD   7 months ago Annual physical exam   [facility] Primary Care & Sports Medicine at [facility], Leita DEL, MD

## 2024-11-10 NOTE — Telephone Encounter (Signed)
 Please review.  KP

## 2024-11-30 ENCOUNTER — Ambulatory Visit: Admitting: Gastroenterology

## 2024-12-11 ENCOUNTER — Other Ambulatory Visit: Admitting: Urology

## 2024-12-13 ENCOUNTER — Ambulatory Visit (HOSPITAL_COMMUNITY): Admitting: Cardiology

## 2025-04-19 ENCOUNTER — Encounter: Admitting: Student

## 2025-04-19 ENCOUNTER — Ambulatory Visit
